# Patient Record
Sex: Female | Born: 1945 | Race: White | Hispanic: No | Marital: Married | Smoking: Never smoker
Health system: Southern US, Community
[De-identification: ages and names within clinical notes are randomized; demographics above are authoritative.]

## PROBLEM LIST (undated history)

## (undated) DIAGNOSIS — Z87440 Personal history of urinary (tract) infections: Secondary | ICD-10-CM

## (undated) DIAGNOSIS — R55 Syncope and collapse: Secondary | ICD-10-CM

## (undated) DIAGNOSIS — Z1211 Encounter for screening for malignant neoplasm of colon: Secondary | ICD-10-CM

## (undated) DIAGNOSIS — Z9221 Personal history of antineoplastic chemotherapy: Secondary | ICD-10-CM

## (undated) DIAGNOSIS — Z8739 Personal history of other diseases of the musculoskeletal system and connective tissue: Secondary | ICD-10-CM

## (undated) DIAGNOSIS — C50419 Malignant neoplasm of upper-outer quadrant of unspecified female breast: Secondary | ICD-10-CM

## (undated) DIAGNOSIS — G20 Parkinson's disease: Secondary | ICD-10-CM

## (undated) DIAGNOSIS — K5900 Constipation, unspecified: Secondary | ICD-10-CM

## (undated) DIAGNOSIS — K639 Disease of intestine, unspecified: Secondary | ICD-10-CM

## (undated) DIAGNOSIS — C801 Malignant (primary) neoplasm, unspecified: Secondary | ICD-10-CM

## (undated) DIAGNOSIS — Z85038 Personal history of other malignant neoplasm of large intestine: Secondary | ICD-10-CM

## (undated) DIAGNOSIS — S42309A Unspecified fracture of shaft of humerus, unspecified arm, initial encounter for closed fracture: Secondary | ICD-10-CM

## (undated) DIAGNOSIS — Z923 Personal history of irradiation: Secondary | ICD-10-CM

## (undated) DIAGNOSIS — C50919 Malignant neoplasm of unspecified site of unspecified female breast: Secondary | ICD-10-CM

## (undated) HISTORY — DX: Unspecified fracture of shaft of humerus, unspecified arm, initial encounter for closed fracture: S42.309A

## (undated) HISTORY — PX: JOINT REPLACEMENT: SHX530

## (undated) HISTORY — DX: Parkinson's disease: G20

## (undated) HISTORY — DX: Personal history of other diseases of the musculoskeletal system and connective tissue: Z87.39

## (undated) HISTORY — DX: Constipation, unspecified: K59.00

## (undated) HISTORY — DX: Malignant neoplasm of upper-outer quadrant of unspecified female breast: C50.419

## (undated) HISTORY — DX: Encounter for screening for malignant neoplasm of colon: Z12.11

## (undated) HISTORY — DX: Disease of intestine, unspecified: K63.9

## (undated) HISTORY — PX: CERVICAL DISCECTOMY: SHX98

## (undated) HISTORY — DX: Personal history of urinary (tract) infections: Z87.440

## (undated) HISTORY — DX: Malignant (primary) neoplasm, unspecified: C80.1

## (undated) HISTORY — DX: Personal history of other malignant neoplasm of large intestine: Z85.038

---

## 1971-01-25 HISTORY — PX: TUBAL LIGATION: SHX77

## 1972-01-25 HISTORY — PX: ABDOMINAL HYSTERECTOMY: SHX81

## 1978-01-24 HISTORY — PX: SKIN CANCER EXCISION: SHX779

## 1983-01-25 HISTORY — PX: BREAST CYST ASPIRATION: SHX578

## 1983-01-25 HISTORY — PX: BACK SURGERY: SHX140

## 1999-03-31 ENCOUNTER — Encounter: Payer: Self-pay | Admitting: Orthopedic Surgery

## 1999-03-31 ENCOUNTER — Ambulatory Visit (HOSPITAL_COMMUNITY): Admission: RE | Admit: 1999-03-31 | Discharge: 1999-03-31 | Payer: Self-pay | Admitting: Orthopedic Surgery

## 2000-01-25 DIAGNOSIS — Z8739 Personal history of other diseases of the musculoskeletal system and connective tissue: Secondary | ICD-10-CM

## 2000-01-25 HISTORY — DX: Personal history of other diseases of the musculoskeletal system and connective tissue: Z87.39

## 2001-05-29 ENCOUNTER — Ambulatory Visit (HOSPITAL_BASED_OUTPATIENT_CLINIC_OR_DEPARTMENT_OTHER): Admission: RE | Admit: 2001-05-29 | Discharge: 2001-05-29 | Payer: Self-pay | Admitting: Orthopaedic Surgery

## 2003-12-12 ENCOUNTER — Ambulatory Visit (HOSPITAL_COMMUNITY): Admission: RE | Admit: 2003-12-12 | Discharge: 2003-12-12 | Payer: Self-pay | Admitting: Gastroenterology

## 2003-12-12 ENCOUNTER — Encounter (INDEPENDENT_AMBULATORY_CARE_PROVIDER_SITE_OTHER): Payer: Self-pay | Admitting: *Deleted

## 2003-12-22 LAB — HM COLONOSCOPY

## 2004-10-30 ENCOUNTER — Emergency Department: Payer: Self-pay | Admitting: Emergency Medicine

## 2005-10-27 ENCOUNTER — Ambulatory Visit: Payer: Self-pay | Admitting: Family Medicine

## 2007-04-10 ENCOUNTER — Encounter: Payer: Self-pay | Admitting: Internal Medicine

## 2007-04-24 ENCOUNTER — Emergency Department: Payer: Self-pay | Admitting: Emergency Medicine

## 2007-08-13 ENCOUNTER — Encounter: Payer: Self-pay | Admitting: Internal Medicine

## 2007-08-20 ENCOUNTER — Ambulatory Visit: Payer: Self-pay | Admitting: Gastroenterology

## 2007-08-20 ENCOUNTER — Encounter: Payer: Self-pay | Admitting: Internal Medicine

## 2007-08-20 HISTORY — PX: UPPER GI ENDOSCOPY: SHX6162

## 2007-11-01 ENCOUNTER — Encounter: Payer: Self-pay | Admitting: Internal Medicine

## 2008-01-10 ENCOUNTER — Encounter: Payer: Self-pay | Admitting: Internal Medicine

## 2008-01-25 DIAGNOSIS — K639 Disease of intestine, unspecified: Secondary | ICD-10-CM

## 2008-01-25 HISTORY — PX: COLONOSCOPY: SHX174

## 2008-01-25 HISTORY — DX: Disease of intestine, unspecified: K63.9

## 2008-03-12 ENCOUNTER — Ambulatory Visit: Payer: Self-pay | Admitting: Gastroenterology

## 2008-03-12 ENCOUNTER — Encounter: Payer: Self-pay | Admitting: Internal Medicine

## 2008-03-22 LAB — HM COLONOSCOPY

## 2008-05-24 ENCOUNTER — Emergency Department (HOSPITAL_COMMUNITY): Admission: EM | Admit: 2008-05-24 | Discharge: 2008-05-24 | Payer: Self-pay | Admitting: Emergency Medicine

## 2008-05-28 ENCOUNTER — Ambulatory Visit: Payer: Self-pay | Admitting: Urology

## 2008-06-11 ENCOUNTER — Ambulatory Visit: Payer: Self-pay | Admitting: Internal Medicine

## 2008-06-11 DIAGNOSIS — M797 Fibromyalgia: Secondary | ICD-10-CM | POA: Insufficient documentation

## 2008-06-11 DIAGNOSIS — K5909 Other constipation: Secondary | ICD-10-CM | POA: Insufficient documentation

## 2008-06-11 DIAGNOSIS — F419 Anxiety disorder, unspecified: Secondary | ICD-10-CM | POA: Insufficient documentation

## 2008-06-16 ENCOUNTER — Ambulatory Visit: Payer: Self-pay | Admitting: Internal Medicine

## 2008-07-18 ENCOUNTER — Telehealth: Payer: Self-pay | Admitting: Internal Medicine

## 2008-07-21 ENCOUNTER — Encounter: Payer: Self-pay | Admitting: Internal Medicine

## 2008-11-20 ENCOUNTER — Telehealth: Payer: Self-pay | Admitting: Internal Medicine

## 2009-01-24 DIAGNOSIS — Z87440 Personal history of urinary (tract) infections: Secondary | ICD-10-CM

## 2009-01-24 HISTORY — PX: PORTACATH PLACEMENT: SHX2246

## 2009-01-24 HISTORY — DX: Personal history of urinary (tract) infections: Z87.440

## 2009-01-24 HISTORY — PX: BREAST LUMPECTOMY: SHX2

## 2009-01-24 HISTORY — PX: BREAST BIOPSY: SHX20

## 2009-01-24 HISTORY — PX: BREAST SURGERY: SHX581

## 2009-06-17 ENCOUNTER — Ambulatory Visit: Payer: Self-pay | Admitting: Family Medicine

## 2009-06-25 ENCOUNTER — Ambulatory Visit: Payer: Self-pay | Admitting: Family Medicine

## 2009-07-16 ENCOUNTER — Ambulatory Visit: Payer: Self-pay | Admitting: Oncology

## 2009-07-16 ENCOUNTER — Ambulatory Visit: Payer: Self-pay | Admitting: General Surgery

## 2009-07-20 ENCOUNTER — Ambulatory Visit: Payer: Self-pay | Admitting: Oncology

## 2009-07-24 ENCOUNTER — Ambulatory Visit: Payer: Self-pay | Admitting: Oncology

## 2009-07-30 ENCOUNTER — Ambulatory Visit: Payer: Self-pay | Admitting: General Surgery

## 2009-07-31 ENCOUNTER — Ambulatory Visit: Payer: Self-pay | Admitting: Oncology

## 2009-08-24 ENCOUNTER — Ambulatory Visit: Payer: Self-pay | Admitting: Oncology

## 2009-09-17 ENCOUNTER — Emergency Department: Payer: Self-pay | Admitting: Emergency Medicine

## 2009-09-24 ENCOUNTER — Ambulatory Visit: Payer: Self-pay | Admitting: Oncology

## 2009-10-24 ENCOUNTER — Ambulatory Visit: Payer: Self-pay | Admitting: Oncology

## 2009-11-24 ENCOUNTER — Ambulatory Visit: Payer: Self-pay | Admitting: Oncology

## 2009-12-24 ENCOUNTER — Ambulatory Visit: Payer: Self-pay | Admitting: Oncology

## 2009-12-24 DIAGNOSIS — C801 Malignant (primary) neoplasm, unspecified: Secondary | ICD-10-CM

## 2009-12-24 DIAGNOSIS — C50919 Malignant neoplasm of unspecified site of unspecified female breast: Secondary | ICD-10-CM

## 2009-12-24 HISTORY — DX: Malignant (primary) neoplasm, unspecified: C80.1

## 2009-12-24 HISTORY — DX: Malignant neoplasm of unspecified site of unspecified female breast: C50.919

## 2009-12-25 ENCOUNTER — Ambulatory Visit: Payer: Self-pay | Admitting: Oncology

## 2010-01-13 ENCOUNTER — Ambulatory Visit: Payer: Self-pay | Admitting: General Surgery

## 2010-01-13 DIAGNOSIS — C50419 Malignant neoplasm of upper-outer quadrant of unspecified female breast: Secondary | ICD-10-CM

## 2010-01-13 HISTORY — DX: Malignant neoplasm of upper-outer quadrant of unspecified female breast: C50.419

## 2010-01-20 LAB — PATHOLOGY REPORT

## 2010-01-24 ENCOUNTER — Ambulatory Visit: Payer: Self-pay | Admitting: Oncology

## 2010-01-24 DIAGNOSIS — Z9221 Personal history of antineoplastic chemotherapy: Secondary | ICD-10-CM

## 2010-01-24 DIAGNOSIS — Z923 Personal history of irradiation: Secondary | ICD-10-CM

## 2010-01-24 HISTORY — DX: Personal history of antineoplastic chemotherapy: Z92.21

## 2010-01-24 HISTORY — DX: Personal history of irradiation: Z92.3

## 2010-02-24 ENCOUNTER — Ambulatory Visit: Payer: Self-pay | Admitting: Oncology

## 2010-03-25 ENCOUNTER — Ambulatory Visit: Payer: Self-pay | Admitting: Oncology

## 2010-04-25 ENCOUNTER — Ambulatory Visit: Payer: Self-pay | Admitting: Oncology

## 2010-05-04 LAB — BASIC METABOLIC PANEL
BUN: 12 mg/dL (ref 6–23)
CO2: 27 mEq/L (ref 19–32)
Calcium: 9.3 mg/dL (ref 8.4–10.5)
Chloride: 107 mEq/L (ref 96–112)
Creatinine, Ser: 0.71 mg/dL (ref 0.4–1.2)
GFR calc Af Amer: >60 mL/min (ref >60)
GFR calc non Af Amer: >60 mL/min (ref >60)
Glucose, Bld: 99 mg/dL (ref 70–99)
Potassium: 4.8 mEq/L (ref 3.5–5.1)
Sodium: 140 mEq/L (ref 135–145)

## 2010-05-25 ENCOUNTER — Ambulatory Visit: Payer: Self-pay | Admitting: Oncology

## 2010-06-11 NOTE — Op Note (Signed)
NAMESHERAL, PFAHLER             ACCOUNT NO.:  [id number]   MEDICAL RECORD NO.:  [id number]          PATIENT TYPE:  AMB   LOCATION:  ENDO                         FACILITY:  [facility]   PHYSICIAN:  James L. Malon Kindle., M.D.DATE OF BIRTH:  27-Oct-1945   DATE OF PROCEDURE:  12/12/2003  DATE OF DISCHARGE:                                 OPERATIVE REPORT   PROCEDURE:  Colonoscopy and polypectomy.   MEDICATIONS:  1.  Fentanyl 125 mcg IV.  2.  Versed 12.5 mg IV.   SCOPE:  Olympus pediatric adjustable colonoscope.   INDICATIONS:  Colon cancer screening, chronic constipation.   DESCRIPTION OF PROCEDURE:  The procedure had been explained to the patient,  consent obtained.  With the patient in the left lateral decubitus position,  the Olympus scope was inserted and advanced.  The scope was withdrawn, after  the cecum was reached.  He had a long, tortuous colon but we were eventually  able to reach the cecum, identified by identification of the ileocecal valve  and appendiceal orifice.  The scope was withdrawn to the cecum; the  ascending colon, transverse colon, splenic flexure, descending and sigmoid  colon were seen well.  The prep was quite good.  No polyps were seen until  approximately 45 cm from the anal verge on withdrawal; a 0.5 cm sessile  polyp was removed with the snare and sucked through the scope.  No other  polyps were seen.  The rectum was free of polyps.   The scope was withdrawn.  The patient tolerated the procedure well.   ASSESSMENT:  Sigmoid colon polyp, removed. (211.3)   PLAN:  Check pathology and routine post-polypectomy instructions.  Will  likely need a repeat if polyp is adenomatous.      JLE/MEDQ  D:  12/12/2003  T:  12/13/2003  Job:  [id number]   cc:   Julieanne Manson  [address]  [place]  [place] [address]  Fax: [phone]

## 2010-06-11 NOTE — Op Note (Signed)
[place]  Patient:    Terri Perez, Terri Perez Visit Number: [id number] MRN: [id number]          Service Type: DSU Location: [facility] Attending Physician:  Marcene Corning Dictated by:   Lubertha Basque. Jerl Santos, M.D. Proc. Date: 05/29/01 Admit Date:  05/29/2001                             Operative Report  PREOPERATIVE DIAGNOSIS: 1. Right shoulder impingement. 2. Right shoulder acromioclavicular degeneration.  POSTOPERATIVE DIAGNOSIS: 1. Right shoulder impingement. 2. Right shoulder acromioclavicular degeneration.  OPERATION PERFORMED: 1. Right shoulder arthroscopic acromioplasty. 2. Right shoulder arthroscopic partial clavulectomy.  ANESTHESIA:  General and block.  ATTENDING SURGEON:  Lubertha Basque. Jerl Santos, M.D.  ASSISTANT:  Lindwood Qua, P.A.  INDICATIONS FOR PROCEDURE:  The patient is a 65 year old woman with a couple of years of right shoulder and arm pain.  This has persisted despite physical therapy and a subacromial injection which did afford her transient relief. She has undergone a preoperative MRI scan which showed a down-sloping acromion with a spur under the acromioclavicular joint and some irrigation of her rotator cuff but no tear.  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  She is offered an arthroscopy.  She has difficulty using her arm and cannot sleep on the side at night.  The procedure was discussed with the patient and informed operative consent was obtained after discussion of possible complications of reaction to anesthesia, infection and aggravation of her reflex sympathetic dystrophy.  DESCRIPTION OF PROCEDURE:  The patient was taken to an operating suite where general anesthetic was applied without difficulty.  She was also given a block in the preanesthesia area.  She was then positioned in beach chair position and prepped and draped in  normal sterile fashion.  After administration of preop IV antibiotics, an arthroscopy of the right shoulder was performed through a total of two portals.  Glenohumeral joint showed no degenerative change and the biceps tendon was well attached at the superior labrum and exited the shoulder appearing benign throughout.  The rotator cuff appeared benign on undersurface inspection.  The shoulder was then entered into the subacromial space.  She had some mild irritation of the rotator cuff seen from above but no tear was seen even after a thorough bursectomy was done.  She had a prominent subacromial morphology addressed with an acromioplasty back to a flat surface.  This was done with the bur in the lateral position followed by transfer of the bur to the posterior position.  She also had a prominence under her AC joint involving the distal portion of the clavicle.  This was co-planed with the acromioplasty, but a formal AC decompression was not required or done.  The shoulder was thoroughly irrigated followed by placement of Marcaine.  Simple sutures of nylon were used to loosely reapproximate the portals followed by Adaptic and a dry gauze dressing with tape.  Estimated blood loss and intraoperative fluids can be obtained from Anesthesia records.  DISPOSITION:  The patient was extubated in the operating room and taken to the recovery room in stable condition.  Plans were for her to go home the same day and to follow up in the office in less than a week.  I will contact her by phone tonight. Dictated by:   Lubertha Basque Jerl Santos, M.D. Attending Physician:  Marcene Corning DD:  05/29/01 TD:  05/30/01 Job: [id number]

## 2010-06-25 ENCOUNTER — Ambulatory Visit: Payer: Self-pay | Admitting: Oncology

## 2010-07-15 ENCOUNTER — Ambulatory Visit: Payer: Self-pay | Admitting: General Surgery

## 2010-07-25 ENCOUNTER — Ambulatory Visit: Payer: Self-pay | Admitting: Oncology

## 2010-08-26 ENCOUNTER — Ambulatory Visit: Payer: Self-pay | Admitting: Oncology

## 2010-08-27 LAB — CANCER ANTIGEN 27.29: CA 27.29: 30.8 U/mL (ref 0.0–38.6)

## 2010-09-25 ENCOUNTER — Ambulatory Visit: Payer: Self-pay | Admitting: Oncology

## 2010-10-25 ENCOUNTER — Ambulatory Visit: Payer: Self-pay | Admitting: Oncology

## 2010-11-25 ENCOUNTER — Ambulatory Visit: Payer: Self-pay | Admitting: Oncology

## 2010-12-30 ENCOUNTER — Ambulatory Visit: Payer: Self-pay | Admitting: Oncology

## 2010-12-30 ENCOUNTER — Ambulatory Visit: Payer: Self-pay | Admitting: General Surgery

## 2011-01-25 ENCOUNTER — Ambulatory Visit: Payer: Self-pay | Admitting: Oncology

## 2011-01-25 DIAGNOSIS — K5900 Constipation, unspecified: Secondary | ICD-10-CM | POA: Diagnosis not present

## 2011-01-25 DIAGNOSIS — F329 Major depressive disorder, single episode, unspecified: Secondary | ICD-10-CM | POA: Diagnosis not present

## 2011-01-25 DIAGNOSIS — Z79899 Other long term (current) drug therapy: Secondary | ICD-10-CM | POA: Diagnosis not present

## 2011-01-25 DIAGNOSIS — Z79811 Long term (current) use of aromatase inhibitors: Secondary | ICD-10-CM | POA: Diagnosis not present

## 2011-01-25 DIAGNOSIS — IMO0001 Reserved for inherently not codable concepts without codable children: Secondary | ICD-10-CM | POA: Diagnosis not present

## 2011-01-25 DIAGNOSIS — C50919 Malignant neoplasm of unspecified site of unspecified female breast: Secondary | ICD-10-CM | POA: Diagnosis not present

## 2011-01-25 DIAGNOSIS — F411 Generalized anxiety disorder: Secondary | ICD-10-CM | POA: Diagnosis not present

## 2011-01-25 DIAGNOSIS — G9050 Complex regional pain syndrome I, unspecified: Secondary | ICD-10-CM | POA: Diagnosis not present

## 2011-01-25 DIAGNOSIS — Z9221 Personal history of antineoplastic chemotherapy: Secondary | ICD-10-CM | POA: Diagnosis not present

## 2011-01-25 DIAGNOSIS — Z452 Encounter for adjustment and management of vascular access device: Secondary | ICD-10-CM | POA: Diagnosis not present

## 2011-01-25 DIAGNOSIS — Z170 Estrogen receptor positive status [ER+]: Secondary | ICD-10-CM | POA: Diagnosis not present

## 2011-01-25 DIAGNOSIS — K5989 Other specified functional intestinal disorders: Secondary | ICD-10-CM | POA: Diagnosis not present

## 2011-01-25 HISTORY — PX: PORT-A-CATH REMOVAL: SHX5289

## 2011-01-31 DIAGNOSIS — N816 Rectocele: Secondary | ICD-10-CM | POA: Diagnosis not present

## 2011-02-10 DIAGNOSIS — C50919 Malignant neoplasm of unspecified site of unspecified female breast: Secondary | ICD-10-CM | POA: Diagnosis not present

## 2011-02-10 DIAGNOSIS — Z79811 Long term (current) use of aromatase inhibitors: Secondary | ICD-10-CM | POA: Diagnosis not present

## 2011-02-10 DIAGNOSIS — Z170 Estrogen receptor positive status [ER+]: Secondary | ICD-10-CM | POA: Diagnosis not present

## 2011-02-10 LAB — CBC CANCER CENTER
Basophil #: 0 x10 3/mm (ref 0.0–0.1)
HGB: 12.5 g/dL (ref 12.0–16.0)
Lymphocyte #: 1.5 x10 3/mm (ref 1.0–3.6)
Lymphocyte %: 35.5 %
MCH: 33.2 pg (ref 26.0–34.0)
Monocyte #: 0.4 x10 3/mm (ref 0.0–0.7)
Monocyte %: 10.2 %
Neutrophil %: 51 %
Platelet: 188 x10 3/mm (ref 150–440)
RBC: 3.78 10*6/uL — ABNORMAL LOW (ref 3.80–5.20)
RDW: 13.8 % (ref 11.5–14.5)
WBC: 4.3 x10 3/mm (ref 3.6–11.0)

## 2011-02-10 LAB — COMPREHENSIVE METABOLIC PANEL
Albumin: 3.5 g/dL (ref 3.4–5.0)
Alkaline Phosphatase: 99 U/L (ref 50–136)
Anion Gap: 10 (ref 7–16)
Bilirubin,Total: 0.3 mg/dL (ref 0.2–1.0)
Calcium, Total: 9.1 mg/dL (ref 8.5–10.1)
Chloride: 105 mmol/L (ref 98–107)
EGFR (African American): >60
EGFR (Non-African Amer.): 55 — ABNORMAL LOW
SGOT(AST): 14 U/L — ABNORMAL LOW (ref 15–37)
Total Protein: 6.8 g/dL (ref 6.4–8.2)

## 2011-02-11 LAB — CANCER ANTIGEN 27.29: CA 27.29: 20.2 U/mL (ref 0.0–38.6)

## 2011-02-17 DIAGNOSIS — Z853 Personal history of malignant neoplasm of breast: Secondary | ICD-10-CM | POA: Diagnosis not present

## 2011-02-25 ENCOUNTER — Ambulatory Visit: Payer: Self-pay | Admitting: Physical Medicine and Rehabilitation

## 2011-02-25 ENCOUNTER — Ambulatory Visit: Payer: Self-pay | Admitting: Oncology

## 2011-02-28 DIAGNOSIS — Z79899 Other long term (current) drug therapy: Secondary | ICD-10-CM | POA: Diagnosis not present

## 2011-02-28 DIAGNOSIS — F411 Generalized anxiety disorder: Secondary | ICD-10-CM | POA: Diagnosis not present

## 2011-02-28 DIAGNOSIS — K5909 Other constipation: Secondary | ICD-10-CM | POA: Diagnosis not present

## 2011-02-28 DIAGNOSIS — K5900 Constipation, unspecified: Secondary | ICD-10-CM | POA: Diagnosis not present

## 2011-03-08 DIAGNOSIS — K5900 Constipation, unspecified: Secondary | ICD-10-CM | POA: Diagnosis not present

## 2011-03-22 DIAGNOSIS — F411 Generalized anxiety disorder: Secondary | ICD-10-CM | POA: Diagnosis not present

## 2011-03-22 DIAGNOSIS — F329 Major depressive disorder, single episode, unspecified: Secondary | ICD-10-CM | POA: Diagnosis not present

## 2011-03-22 DIAGNOSIS — C50919 Malignant neoplasm of unspecified site of unspecified female breast: Secondary | ICD-10-CM | POA: Diagnosis not present

## 2011-03-22 DIAGNOSIS — K589 Irritable bowel syndrome without diarrhea: Secondary | ICD-10-CM | POA: Diagnosis not present

## 2011-05-09 DIAGNOSIS — K5902 Outlet dysfunction constipation: Secondary | ICD-10-CM | POA: Diagnosis not present

## 2011-05-18 ENCOUNTER — Encounter: Payer: Self-pay | Admitting: Gastroenterology

## 2011-05-18 DIAGNOSIS — R279 Unspecified lack of coordination: Secondary | ICD-10-CM | POA: Diagnosis not present

## 2011-05-18 DIAGNOSIS — C50919 Malignant neoplasm of unspecified site of unspecified female breast: Secondary | ICD-10-CM | POA: Diagnosis not present

## 2011-05-18 DIAGNOSIS — N3946 Mixed incontinence: Secondary | ICD-10-CM | POA: Diagnosis not present

## 2011-05-18 DIAGNOSIS — K5900 Constipation, unspecified: Secondary | ICD-10-CM | POA: Diagnosis not present

## 2011-05-18 DIAGNOSIS — F411 Generalized anxiety disorder: Secondary | ICD-10-CM | POA: Diagnosis not present

## 2011-05-18 DIAGNOSIS — K589 Irritable bowel syndrome without diarrhea: Secondary | ICD-10-CM | POA: Diagnosis not present

## 2011-05-18 DIAGNOSIS — F329 Major depressive disorder, single episode, unspecified: Secondary | ICD-10-CM | POA: Diagnosis not present

## 2011-05-18 DIAGNOSIS — IMO0001 Reserved for inherently not codable concepts without codable children: Secondary | ICD-10-CM | POA: Diagnosis not present

## 2011-05-25 ENCOUNTER — Encounter: Payer: Self-pay | Admitting: Gastroenterology

## 2011-05-25 DIAGNOSIS — N3946 Mixed incontinence: Secondary | ICD-10-CM | POA: Diagnosis not present

## 2011-05-25 DIAGNOSIS — IMO0001 Reserved for inherently not codable concepts without codable children: Secondary | ICD-10-CM | POA: Diagnosis not present

## 2011-05-25 DIAGNOSIS — K5900 Constipation, unspecified: Secondary | ICD-10-CM | POA: Diagnosis not present

## 2011-05-25 DIAGNOSIS — R279 Unspecified lack of coordination: Secondary | ICD-10-CM | POA: Diagnosis not present

## 2011-05-30 DIAGNOSIS — K5900 Constipation, unspecified: Secondary | ICD-10-CM | POA: Diagnosis not present

## 2011-05-30 DIAGNOSIS — IMO0001 Reserved for inherently not codable concepts without codable children: Secondary | ICD-10-CM | POA: Diagnosis not present

## 2011-05-30 DIAGNOSIS — N3946 Mixed incontinence: Secondary | ICD-10-CM | POA: Diagnosis not present

## 2011-05-30 DIAGNOSIS — R279 Unspecified lack of coordination: Secondary | ICD-10-CM | POA: Diagnosis not present

## 2011-06-27 DIAGNOSIS — R143 Flatulence: Secondary | ICD-10-CM | POA: Diagnosis not present

## 2011-06-27 DIAGNOSIS — R142 Eructation: Secondary | ICD-10-CM | POA: Diagnosis not present

## 2011-06-27 DIAGNOSIS — R141 Gas pain: Secondary | ICD-10-CM | POA: Diagnosis not present

## 2011-06-27 DIAGNOSIS — R110 Nausea: Secondary | ICD-10-CM | POA: Diagnosis not present

## 2011-06-27 DIAGNOSIS — R109 Unspecified abdominal pain: Secondary | ICD-10-CM | POA: Diagnosis not present

## 2011-06-27 DIAGNOSIS — K5900 Constipation, unspecified: Secondary | ICD-10-CM | POA: Diagnosis not present

## 2011-06-27 DIAGNOSIS — K5909 Other constipation: Secondary | ICD-10-CM | POA: Diagnosis not present

## 2011-07-21 ENCOUNTER — Ambulatory Visit: Payer: Self-pay | Admitting: General Surgery

## 2011-07-21 DIAGNOSIS — N6459 Other signs and symptoms in breast: Secondary | ICD-10-CM | POA: Diagnosis not present

## 2011-07-21 DIAGNOSIS — Z853 Personal history of malignant neoplasm of breast: Secondary | ICD-10-CM | POA: Diagnosis not present

## 2011-07-21 DIAGNOSIS — R928 Other abnormal and inconclusive findings on diagnostic imaging of breast: Secondary | ICD-10-CM | POA: Diagnosis not present

## 2011-07-25 ENCOUNTER — Ambulatory Visit: Payer: Self-pay | Admitting: General Surgery

## 2011-07-25 DIAGNOSIS — R928 Other abnormal and inconclusive findings on diagnostic imaging of breast: Secondary | ICD-10-CM | POA: Diagnosis not present

## 2011-08-04 DIAGNOSIS — Z853 Personal history of malignant neoplasm of breast: Secondary | ICD-10-CM | POA: Diagnosis not present

## 2011-08-10 DIAGNOSIS — R1013 Epigastric pain: Secondary | ICD-10-CM | POA: Diagnosis not present

## 2011-08-10 DIAGNOSIS — Z79899 Other long term (current) drug therapy: Secondary | ICD-10-CM | POA: Diagnosis not present

## 2011-08-10 DIAGNOSIS — R143 Flatulence: Secondary | ICD-10-CM | POA: Diagnosis not present

## 2011-08-10 DIAGNOSIS — K3189 Other diseases of stomach and duodenum: Secondary | ICD-10-CM | POA: Diagnosis not present

## 2011-08-10 DIAGNOSIS — R109 Unspecified abdominal pain: Secondary | ICD-10-CM | POA: Diagnosis not present

## 2011-08-10 DIAGNOSIS — K3184 Gastroparesis: Secondary | ICD-10-CM | POA: Diagnosis not present

## 2011-08-10 DIAGNOSIS — R141 Gas pain: Secondary | ICD-10-CM | POA: Diagnosis not present

## 2011-08-10 DIAGNOSIS — R110 Nausea: Secondary | ICD-10-CM | POA: Diagnosis not present

## 2011-08-10 DIAGNOSIS — K5900 Constipation, unspecified: Secondary | ICD-10-CM | POA: Diagnosis not present

## 2011-08-11 ENCOUNTER — Ambulatory Visit: Payer: Self-pay | Admitting: Oncology

## 2011-08-11 DIAGNOSIS — F411 Generalized anxiety disorder: Secondary | ICD-10-CM | POA: Diagnosis not present

## 2011-08-11 DIAGNOSIS — Z85828 Personal history of other malignant neoplasm of skin: Secondary | ICD-10-CM | POA: Diagnosis not present

## 2011-08-11 DIAGNOSIS — Z79899 Other long term (current) drug therapy: Secondary | ICD-10-CM | POA: Diagnosis not present

## 2011-08-11 DIAGNOSIS — Z79811 Long term (current) use of aromatase inhibitors: Secondary | ICD-10-CM | POA: Diagnosis not present

## 2011-08-11 DIAGNOSIS — K5900 Constipation, unspecified: Secondary | ICD-10-CM | POA: Diagnosis not present

## 2011-08-11 DIAGNOSIS — F329 Major depressive disorder, single episode, unspecified: Secondary | ICD-10-CM | POA: Diagnosis not present

## 2011-08-11 DIAGNOSIS — C50919 Malignant neoplasm of unspecified site of unspecified female breast: Secondary | ICD-10-CM | POA: Diagnosis not present

## 2011-08-11 DIAGNOSIS — Z170 Estrogen receptor positive status [ER+]: Secondary | ICD-10-CM | POA: Diagnosis not present

## 2011-08-11 DIAGNOSIS — IMO0001 Reserved for inherently not codable concepts without codable children: Secondary | ICD-10-CM | POA: Diagnosis not present

## 2011-08-11 LAB — COMPREHENSIVE METABOLIC PANEL
Albumin: 3.6 g/dL (ref 3.4–5.0)
Alkaline Phosphatase: 86 U/L (ref 50–136)
Bilirubin,Total: 0.4 mg/dL (ref 0.2–1.0)
Co2: 27 mmol/L (ref 21–32)
Creatinine: 1.14 mg/dL (ref 0.60–1.30)
Glucose: 90 mg/dL (ref 65–99)
Osmolality: 283 (ref 275–301)
Sodium: 142 mmol/L (ref 136–145)
Total Protein: 7.1 g/dL (ref 6.4–8.2)

## 2011-08-11 LAB — CBC CANCER CENTER
Basophil #: 0 x10 3/mm (ref 0.0–0.1)
Lymphocyte #: 1.3 x10 3/mm (ref 1.0–3.6)
Lymphocyte %: 37.7 %
MCHC: 32.3 g/dL (ref 32.0–36.0)
MCV: 101 fL — ABNORMAL HIGH (ref 80–100)
Monocyte #: 0.3 x10 3/mm (ref 0.2–0.9)
Monocyte %: 9.3 %
Platelet: 185 x10 3/mm (ref 150–440)
RDW: 13.4 % (ref 11.5–14.5)
WBC: 3.5 x10 3/mm — ABNORMAL LOW (ref 3.6–11.0)

## 2011-08-12 LAB — CANCER ANTIGEN 27.29: CA 27.29: 23.9 U/mL (ref 0.0–38.6)

## 2011-08-18 DIAGNOSIS — F411 Generalized anxiety disorder: Secondary | ICD-10-CM | POA: Diagnosis not present

## 2011-08-18 DIAGNOSIS — C50919 Malignant neoplasm of unspecified site of unspecified female breast: Secondary | ICD-10-CM | POA: Diagnosis not present

## 2011-08-18 DIAGNOSIS — F329 Major depressive disorder, single episode, unspecified: Secondary | ICD-10-CM | POA: Diagnosis not present

## 2011-08-18 DIAGNOSIS — K589 Irritable bowel syndrome without diarrhea: Secondary | ICD-10-CM | POA: Diagnosis not present

## 2011-08-22 DIAGNOSIS — K5900 Constipation, unspecified: Secondary | ICD-10-CM | POA: Diagnosis not present

## 2011-08-25 ENCOUNTER — Ambulatory Visit: Payer: Self-pay | Admitting: Oncology

## 2011-10-10 DIAGNOSIS — K5900 Constipation, unspecified: Secondary | ICD-10-CM | POA: Diagnosis not present

## 2011-10-19 DIAGNOSIS — F0630 Mood disorder due to known physiological condition, unspecified: Secondary | ICD-10-CM | POA: Diagnosis not present

## 2011-10-19 DIAGNOSIS — F411 Generalized anxiety disorder: Secondary | ICD-10-CM | POA: Diagnosis not present

## 2011-11-01 DIAGNOSIS — K5901 Slow transit constipation: Secondary | ICD-10-CM | POA: Diagnosis not present

## 2011-11-21 DIAGNOSIS — F329 Major depressive disorder, single episode, unspecified: Secondary | ICD-10-CM | POA: Diagnosis not present

## 2011-11-21 DIAGNOSIS — K589 Irritable bowel syndrome without diarrhea: Secondary | ICD-10-CM | POA: Diagnosis not present

## 2011-11-21 DIAGNOSIS — F411 Generalized anxiety disorder: Secondary | ICD-10-CM | POA: Diagnosis not present

## 2011-11-21 DIAGNOSIS — C50919 Malignant neoplasm of unspecified site of unspecified female breast: Secondary | ICD-10-CM | POA: Diagnosis not present

## 2011-12-01 DIAGNOSIS — B079 Viral wart, unspecified: Secondary | ICD-10-CM | POA: Diagnosis not present

## 2011-12-01 DIAGNOSIS — L819 Disorder of pigmentation, unspecified: Secondary | ICD-10-CM | POA: Diagnosis not present

## 2012-01-25 ENCOUNTER — Ambulatory Visit: Payer: Self-pay | Admitting: Oncology

## 2012-02-03 DIAGNOSIS — R300 Dysuria: Secondary | ICD-10-CM | POA: Diagnosis not present

## 2012-02-03 DIAGNOSIS — R5383 Other fatigue: Secondary | ICD-10-CM | POA: Diagnosis not present

## 2012-02-03 DIAGNOSIS — N390 Urinary tract infection, site not specified: Secondary | ICD-10-CM | POA: Diagnosis not present

## 2012-02-03 DIAGNOSIS — J3489 Other specified disorders of nose and nasal sinuses: Secondary | ICD-10-CM | POA: Diagnosis not present

## 2012-02-03 DIAGNOSIS — R109 Unspecified abdominal pain: Secondary | ICD-10-CM | POA: Diagnosis not present

## 2012-02-03 DIAGNOSIS — R5381 Other malaise: Secondary | ICD-10-CM | POA: Diagnosis not present

## 2012-02-03 DIAGNOSIS — R350 Frequency of micturition: Secondary | ICD-10-CM | POA: Diagnosis not present

## 2012-02-09 ENCOUNTER — Ambulatory Visit: Payer: Self-pay | Admitting: Oncology

## 2012-02-09 DIAGNOSIS — Z79899 Other long term (current) drug therapy: Secondary | ICD-10-CM | POA: Diagnosis not present

## 2012-02-09 DIAGNOSIS — F329 Major depressive disorder, single episode, unspecified: Secondary | ICD-10-CM | POA: Diagnosis not present

## 2012-02-09 DIAGNOSIS — N390 Urinary tract infection, site not specified: Secondary | ICD-10-CM | POA: Diagnosis not present

## 2012-02-09 DIAGNOSIS — Z923 Personal history of irradiation: Secondary | ICD-10-CM | POA: Diagnosis not present

## 2012-02-09 DIAGNOSIS — K5900 Constipation, unspecified: Secondary | ICD-10-CM | POA: Diagnosis not present

## 2012-02-09 DIAGNOSIS — Z87440 Personal history of urinary (tract) infections: Secondary | ICD-10-CM | POA: Diagnosis not present

## 2012-02-09 DIAGNOSIS — F411 Generalized anxiety disorder: Secondary | ICD-10-CM | POA: Diagnosis not present

## 2012-02-09 DIAGNOSIS — IMO0001 Reserved for inherently not codable concepts without codable children: Secondary | ICD-10-CM | POA: Diagnosis not present

## 2012-02-09 DIAGNOSIS — Z170 Estrogen receptor positive status [ER+]: Secondary | ICD-10-CM | POA: Diagnosis not present

## 2012-02-09 DIAGNOSIS — C50919 Malignant neoplasm of unspecified site of unspecified female breast: Secondary | ICD-10-CM | POA: Diagnosis not present

## 2012-02-09 DIAGNOSIS — Z85828 Personal history of other malignant neoplasm of skin: Secondary | ICD-10-CM | POA: Diagnosis not present

## 2012-02-09 DIAGNOSIS — Z79811 Long term (current) use of aromatase inhibitors: Secondary | ICD-10-CM | POA: Diagnosis not present

## 2012-02-09 LAB — CBC CANCER CENTER
Eosinophil #: 0.1 x10 3/mm (ref 0.0–0.7)
Eosinophil %: 2.5 %
HCT: 38.1 % (ref 35.0–47.0)
HGB: 12.9 g/dL (ref 12.0–16.0)
Lymphocyte #: 1.4 x10 3/mm (ref 1.0–3.6)
MCHC: 34 g/dL (ref 32.0–36.0)
MCV: 97 fL (ref 80–100)
Monocyte #: 0.4 x10 3/mm (ref 0.2–0.9)
Neutrophil %: 55.4 %
Platelet: 187 x10 3/mm (ref 150–440)
RBC: 3.92 10*6/uL (ref 3.80–5.20)
WBC: 4.5 x10 3/mm (ref 3.6–11.0)

## 2012-02-09 LAB — COMPREHENSIVE METABOLIC PANEL
Albumin: 3.4 g/dL (ref 3.4–5.0)
BUN: 13 mg/dL (ref 7–18)
EGFR (Non-African Amer.): >60
Potassium: 3.8 mmol/L (ref 3.5–5.1)
SGPT (ALT): 22 U/L (ref 12–78)
Total Protein: 6.8 g/dL (ref 6.4–8.2)

## 2012-02-10 LAB — CANCER ANTIGEN 27.29: CA 27.29: 23.4 U/mL (ref 0.0–38.6)

## 2012-02-23 ENCOUNTER — Ambulatory Visit: Payer: Self-pay | Admitting: General Surgery

## 2012-02-23 DIAGNOSIS — Z853 Personal history of malignant neoplasm of breast: Secondary | ICD-10-CM | POA: Diagnosis not present

## 2012-02-23 DIAGNOSIS — R928 Other abnormal and inconclusive findings on diagnostic imaging of breast: Secondary | ICD-10-CM | POA: Diagnosis not present

## 2012-02-25 ENCOUNTER — Ambulatory Visit: Payer: Self-pay | Admitting: Oncology

## 2012-03-01 DIAGNOSIS — Z853 Personal history of malignant neoplasm of breast: Secondary | ICD-10-CM | POA: Diagnosis not present

## 2012-03-15 DIAGNOSIS — H04129 Dry eye syndrome of unspecified lacrimal gland: Secondary | ICD-10-CM | POA: Diagnosis not present

## 2012-03-27 DIAGNOSIS — N760 Acute vaginitis: Secondary | ICD-10-CM | POA: Diagnosis not present

## 2012-04-25 DIAGNOSIS — F411 Generalized anxiety disorder: Secondary | ICD-10-CM | POA: Diagnosis not present

## 2012-04-25 DIAGNOSIS — F329 Major depressive disorder, single episode, unspecified: Secondary | ICD-10-CM | POA: Diagnosis not present

## 2012-04-25 DIAGNOSIS — G4700 Insomnia, unspecified: Secondary | ICD-10-CM | POA: Diagnosis not present

## 2012-04-30 DIAGNOSIS — M722 Plantar fascial fibromatosis: Secondary | ICD-10-CM | POA: Diagnosis not present

## 2012-07-24 ENCOUNTER — Ambulatory Visit: Payer: Self-pay | Admitting: Oncology

## 2012-07-24 DIAGNOSIS — R5381 Other malaise: Secondary | ICD-10-CM | POA: Diagnosis not present

## 2012-07-24 DIAGNOSIS — IMO0001 Reserved for inherently not codable concepts without codable children: Secondary | ICD-10-CM | POA: Diagnosis not present

## 2012-07-24 DIAGNOSIS — Z85828 Personal history of other malignant neoplasm of skin: Secondary | ICD-10-CM | POA: Diagnosis not present

## 2012-07-24 DIAGNOSIS — F411 Generalized anxiety disorder: Secondary | ICD-10-CM | POA: Diagnosis not present

## 2012-07-24 DIAGNOSIS — Z79811 Long term (current) use of aromatase inhibitors: Secondary | ICD-10-CM | POA: Diagnosis not present

## 2012-07-24 DIAGNOSIS — Z170 Estrogen receptor positive status [ER+]: Secondary | ICD-10-CM | POA: Diagnosis not present

## 2012-07-24 DIAGNOSIS — F329 Major depressive disorder, single episode, unspecified: Secondary | ICD-10-CM | POA: Diagnosis not present

## 2012-07-24 DIAGNOSIS — C50919 Malignant neoplasm of unspecified site of unspecified female breast: Secondary | ICD-10-CM | POA: Diagnosis not present

## 2012-07-24 DIAGNOSIS — K3189 Other diseases of stomach and duodenum: Secondary | ICD-10-CM | POA: Diagnosis not present

## 2012-07-24 DIAGNOSIS — Z79899 Other long term (current) drug therapy: Secondary | ICD-10-CM | POA: Diagnosis not present

## 2012-07-24 DIAGNOSIS — Z90710 Acquired absence of both cervix and uterus: Secondary | ICD-10-CM | POA: Diagnosis not present

## 2012-07-25 ENCOUNTER — Encounter: Payer: Self-pay | Admitting: *Deleted

## 2012-07-25 DIAGNOSIS — Z85038 Personal history of other malignant neoplasm of large intestine: Secondary | ICD-10-CM | POA: Insufficient documentation

## 2012-07-25 DIAGNOSIS — C50419 Malignant neoplasm of upper-outer quadrant of unspecified female breast: Secondary | ICD-10-CM | POA: Insufficient documentation

## 2012-08-09 DIAGNOSIS — Z170 Estrogen receptor positive status [ER+]: Secondary | ICD-10-CM | POA: Diagnosis not present

## 2012-08-09 DIAGNOSIS — C50919 Malignant neoplasm of unspecified site of unspecified female breast: Secondary | ICD-10-CM | POA: Diagnosis not present

## 2012-08-09 DIAGNOSIS — K3189 Other diseases of stomach and duodenum: Secondary | ICD-10-CM | POA: Diagnosis not present

## 2012-08-09 DIAGNOSIS — R1013 Epigastric pain: Secondary | ICD-10-CM | POA: Diagnosis not present

## 2012-08-09 DIAGNOSIS — Z79811 Long term (current) use of aromatase inhibitors: Secondary | ICD-10-CM | POA: Diagnosis not present

## 2012-08-09 LAB — CBC CANCER CENTER
Basophil #: 0 x10 3/mm (ref 0.0–0.1)
HCT: 37.9 % (ref 35.0–47.0)
HGB: 12.9 g/dL (ref 12.0–16.0)
Lymphocyte #: 1.3 x10 3/mm (ref 1.0–3.6)
MCHC: 34.1 g/dL (ref 32.0–36.0)
MCV: 97 fL (ref 80–100)
Monocyte #: 0.5 x10 3/mm (ref 0.2–0.9)
Monocyte %: 9.9 %
RBC: 3.9 10*6/uL (ref 3.80–5.20)
RDW: 13.6 % (ref 11.5–14.5)
WBC: 4.5 x10 3/mm (ref 3.6–11.0)

## 2012-08-09 LAB — COMPREHENSIVE METABOLIC PANEL
Calcium, Total: 9.2 mg/dL (ref 8.5–10.1)
Creatinine: 0.91 mg/dL (ref 0.60–1.30)
EGFR (Non-African Amer.): >60
Glucose: 95 mg/dL (ref 65–99)
Osmolality: 281 (ref 275–301)
Potassium: 4.2 mmol/L (ref 3.5–5.1)
SGPT (ALT): 20 U/L (ref 12–78)
Sodium: 139 mmol/L (ref 136–145)

## 2012-08-11 LAB — CANCER ANTIGEN 27.29: CA 27.29: 24.2 U/mL (ref 0.0–38.6)

## 2012-08-24 ENCOUNTER — Ambulatory Visit: Payer: Self-pay | Admitting: Oncology

## 2012-09-03 DIAGNOSIS — K5902 Outlet dysfunction constipation: Secondary | ICD-10-CM | POA: Diagnosis not present

## 2012-09-12 DIAGNOSIS — F411 Generalized anxiety disorder: Secondary | ICD-10-CM | POA: Diagnosis not present

## 2012-09-12 DIAGNOSIS — B079 Viral wart, unspecified: Secondary | ICD-10-CM | POA: Diagnosis not present

## 2012-09-12 DIAGNOSIS — F329 Major depressive disorder, single episode, unspecified: Secondary | ICD-10-CM | POA: Diagnosis not present

## 2012-09-12 DIAGNOSIS — G4700 Insomnia, unspecified: Secondary | ICD-10-CM | POA: Diagnosis not present

## 2012-09-12 DIAGNOSIS — K5900 Constipation, unspecified: Secondary | ICD-10-CM | POA: Diagnosis not present

## 2012-09-17 DIAGNOSIS — K5902 Outlet dysfunction constipation: Secondary | ICD-10-CM | POA: Diagnosis not present

## 2012-09-24 HISTORY — PX: COLON RESECTION: SHX5231

## 2012-09-27 DIAGNOSIS — IMO0001 Reserved for inherently not codable concepts without codable children: Secondary | ICD-10-CM | POA: Diagnosis not present

## 2012-09-27 DIAGNOSIS — R141 Gas pain: Secondary | ICD-10-CM | POA: Diagnosis not present

## 2012-09-27 DIAGNOSIS — Z01818 Encounter for other preprocedural examination: Secondary | ICD-10-CM | POA: Diagnosis not present

## 2012-09-27 DIAGNOSIS — F411 Generalized anxiety disorder: Secondary | ICD-10-CM | POA: Diagnosis not present

## 2012-09-27 DIAGNOSIS — Z853 Personal history of malignant neoplasm of breast: Secondary | ICD-10-CM | POA: Diagnosis not present

## 2012-09-27 DIAGNOSIS — K5900 Constipation, unspecified: Secondary | ICD-10-CM | POA: Diagnosis not present

## 2012-10-02 DIAGNOSIS — K5900 Constipation, unspecified: Secondary | ICD-10-CM | POA: Diagnosis present

## 2012-10-02 DIAGNOSIS — F411 Generalized anxiety disorder: Secondary | ICD-10-CM | POA: Diagnosis present

## 2012-10-02 DIAGNOSIS — K5901 Slow transit constipation: Secondary | ICD-10-CM | POA: Diagnosis not present

## 2012-10-02 DIAGNOSIS — C50919 Malignant neoplasm of unspecified site of unspecified female breast: Secondary | ICD-10-CM | POA: Diagnosis present

## 2012-10-02 DIAGNOSIS — K929 Disease of digestive system, unspecified: Secondary | ICD-10-CM | POA: Diagnosis not present

## 2012-10-02 DIAGNOSIS — IMO0001 Reserved for inherently not codable concepts without codable children: Secondary | ICD-10-CM | POA: Diagnosis present

## 2012-10-02 DIAGNOSIS — K5989 Other specified functional intestinal disorders: Secondary | ICD-10-CM | POA: Diagnosis not present

## 2012-10-29 DIAGNOSIS — K5989 Other specified functional intestinal disorders: Secondary | ICD-10-CM | POA: Diagnosis not present

## 2012-11-09 DIAGNOSIS — R319 Hematuria, unspecified: Secondary | ICD-10-CM | POA: Diagnosis not present

## 2012-11-09 DIAGNOSIS — N390 Urinary tract infection, site not specified: Secondary | ICD-10-CM | POA: Diagnosis not present

## 2012-11-09 DIAGNOSIS — R110 Nausea: Secondary | ICD-10-CM | POA: Diagnosis not present

## 2012-12-10 DIAGNOSIS — K5989 Other specified functional intestinal disorders: Secondary | ICD-10-CM | POA: Diagnosis not present

## 2013-01-10 DIAGNOSIS — F411 Generalized anxiety disorder: Secondary | ICD-10-CM | POA: Diagnosis not present

## 2013-01-10 DIAGNOSIS — IMO0001 Reserved for inherently not codable concepts without codable children: Secondary | ICD-10-CM | POA: Diagnosis not present

## 2013-02-14 ENCOUNTER — Ambulatory Visit: Payer: Self-pay | Admitting: Oncology

## 2013-02-14 DIAGNOSIS — L659 Nonscarring hair loss, unspecified: Secondary | ICD-10-CM | POA: Diagnosis not present

## 2013-02-14 DIAGNOSIS — C50919 Malignant neoplasm of unspecified site of unspecified female breast: Secondary | ICD-10-CM | POA: Diagnosis not present

## 2013-02-14 DIAGNOSIS — K3189 Other diseases of stomach and duodenum: Secondary | ICD-10-CM | POA: Diagnosis not present

## 2013-02-14 DIAGNOSIS — F329 Major depressive disorder, single episode, unspecified: Secondary | ICD-10-CM | POA: Diagnosis not present

## 2013-02-14 DIAGNOSIS — Z85828 Personal history of other malignant neoplasm of skin: Secondary | ICD-10-CM | POA: Diagnosis not present

## 2013-02-14 DIAGNOSIS — Z803 Family history of malignant neoplasm of breast: Secondary | ICD-10-CM | POA: Diagnosis not present

## 2013-02-14 DIAGNOSIS — R1013 Epigastric pain: Secondary | ICD-10-CM | POA: Diagnosis not present

## 2013-02-14 DIAGNOSIS — Z170 Estrogen receptor positive status [ER+]: Secondary | ICD-10-CM | POA: Diagnosis not present

## 2013-02-14 DIAGNOSIS — Z90710 Acquired absence of both cervix and uterus: Secondary | ICD-10-CM | POA: Diagnosis not present

## 2013-02-14 DIAGNOSIS — IMO0001 Reserved for inherently not codable concepts without codable children: Secondary | ICD-10-CM | POA: Diagnosis not present

## 2013-02-14 DIAGNOSIS — F3289 Other specified depressive episodes: Secondary | ICD-10-CM | POA: Diagnosis not present

## 2013-02-14 DIAGNOSIS — Z79899 Other long term (current) drug therapy: Secondary | ICD-10-CM | POA: Diagnosis not present

## 2013-02-14 DIAGNOSIS — Z79811 Long term (current) use of aromatase inhibitors: Secondary | ICD-10-CM | POA: Diagnosis not present

## 2013-02-14 DIAGNOSIS — Z9889 Other specified postprocedural states: Secondary | ICD-10-CM | POA: Diagnosis not present

## 2013-02-14 LAB — CBC CANCER CENTER
BASOS ABS: 0 x10 3/mm (ref 0.0–0.1)
Basophil %: 0.6 %
EOS ABS: 0.1 x10 3/mm (ref 0.0–0.7)
Eosinophil %: 2.3 %
HCT: 37 % (ref 35.0–47.0)
HGB: 12.1 g/dL (ref 12.0–16.0)
LYMPHS ABS: 1.6 x10 3/mm (ref 1.0–3.6)
LYMPHS PCT: 33 %
MCH: 31.8 pg (ref 26.0–34.0)
MCHC: 32.8 g/dL (ref 32.0–36.0)
MCV: 97 fL (ref 80–100)
MONO ABS: 0.4 x10 3/mm (ref 0.2–0.9)
MONOS PCT: 8 %
NEUTROS PCT: 56.1 %
Neutrophil #: 2.7 x10 3/mm (ref 1.4–6.5)
Platelet: 206 x10 3/mm (ref 150–440)
RBC: 3.82 10*6/uL (ref 3.80–5.20)
RDW: 13.2 % (ref 11.5–14.5)
WBC: 4.8 x10 3/mm (ref 3.6–11.0)

## 2013-02-14 LAB — COMPREHENSIVE METABOLIC PANEL
Albumin: 3.8 g/dL (ref 3.4–5.0)
Alkaline Phosphatase: 80 U/L
Anion Gap: 10 (ref 7–16)
BILIRUBIN TOTAL: 0.3 mg/dL (ref 0.2–1.0)
BUN: 22 mg/dL — AB (ref 7–18)
CHLORIDE: 102 mmol/L (ref 98–107)
Calcium, Total: 9.2 mg/dL (ref 8.5–10.1)
Co2: 25 mmol/L (ref 21–32)
Creatinine: 1.16 mg/dL (ref 0.60–1.30)
EGFR (African American): 56 — ABNORMAL LOW
EGFR (Non-African Amer.): 48 — ABNORMAL LOW
GLUCOSE: 107 mg/dL — AB (ref 65–99)
OSMOLALITY: 278 (ref 275–301)
Potassium: 4.1 mmol/L (ref 3.5–5.1)
SGOT(AST): 16 U/L (ref 15–37)
SGPT (ALT): 21 U/L (ref 12–78)
Sodium: 137 mmol/L (ref 136–145)
Total Protein: 7.3 g/dL (ref 6.4–8.2)

## 2013-02-15 LAB — CANCER ANTIGEN 27.29: CA 27.29: 27.7 U/mL (ref 0.0–38.6)

## 2013-02-24 ENCOUNTER — Ambulatory Visit: Payer: Self-pay | Admitting: Oncology

## 2013-02-27 ENCOUNTER — Encounter: Payer: Self-pay | Admitting: General Surgery

## 2013-02-27 ENCOUNTER — Ambulatory Visit: Payer: Self-pay | Admitting: General Surgery

## 2013-02-27 DIAGNOSIS — R922 Inconclusive mammogram: Secondary | ICD-10-CM | POA: Diagnosis not present

## 2013-02-27 DIAGNOSIS — Z853 Personal history of malignant neoplasm of breast: Secondary | ICD-10-CM | POA: Diagnosis not present

## 2013-02-28 ENCOUNTER — Ambulatory Visit: Payer: Self-pay | Admitting: Oncology

## 2013-03-06 ENCOUNTER — Encounter: Payer: Self-pay | Admitting: General Surgery

## 2013-03-06 ENCOUNTER — Ambulatory Visit (INDEPENDENT_AMBULATORY_CARE_PROVIDER_SITE_OTHER): Payer: Medicare Other | Admitting: General Surgery

## 2013-03-06 VITALS — BP 142/76 | HR 72 | Resp 14 | Ht 69.0 in | Wt 176.0 lb

## 2013-03-06 DIAGNOSIS — C50919 Malignant neoplasm of unspecified site of unspecified female breast: Secondary | ICD-10-CM | POA: Diagnosis not present

## 2013-03-06 NOTE — Patient Instructions (Signed)
Continue self breast exams. Call office for any new breast issues or concerns. 

## 2013-03-06 NOTE — Progress Notes (Signed)
[name], female   DOB: 02/15/1945, 68 y.o.   MRN: [id number]  Chief Complaint  Patient presents with  . Follow-up    mammogram, history of breast cancer    HPI Terri Perez is a 68 y.o. female.  who presents for her annual follow up mammogram and breast evaluation. The most recent mammogram was done on 02-27-13. The patient is accompanied today by her husband. Patient does perform regular self breast checks and gets regular mammograms done.  No new breast issues, tolerating Femara. Appointment with Dr Oliva Bustard was in January. Recovering from recent bowel surgery for chronic constipation with what sounds like a subtotal colectomy and ileorectal ostomy at [facility]. The patient reports she is still having about 5-6 bowel movements per day, but no difficulty with incontinence.   HPI  Past Medical History  Diagnosis Date  . Personal history of fibromyalgia 2002  . Unspecified constipation   . H/O cystitis 2011  . Special screening for malignant neoplasms, colon   . Arm fracture     right forearm, d/t fall  . Personal history of malignant neoplasm of large intestine   . Bowel trouble 2010  . Cancer 12/2009    Left UOQ breast tumor; wide excision,sn bx and whole breast radiation. Chemotherapy done. There was only a 31m area of residual tumor remaining. All sn were negative. This was ER-positive, PR-negative, HER-2 neu not over expressed tumor. The original ultrasound size was 2.6 cm(T2).  . 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 On wide excision 3 mm area of residual tumor. Sentinel node negative. ER-positive, PR-negative, HER-2 do not overexpressing.    Past Surgical History  Procedure Laterality Date  . Colonoscopy  2010    Dr. WAllen Norris normal  . Portacath placement  2011  . Port-a-cath removal  2013  . Breast surgery Left 2011    wide excision  . Breast biopsy Left 2011  .  Breast cyst aspiration Left 1985  . Skin cancer excision  1980    face  . Tubal ligation  1973  . Abdominal hysterectomy  1974  . Back surgery  1985    bone spurs between 5&6, used bone from left hip  . Colon resection  Sept 2014    Terri Perez    Family History  Problem Relation Age of Onset  . Cancer Brother     colon  . Cancer Other     breast and ovarian cancers, relationships not listed  . Cancer Other 547   niece    Social History History  Substance Use Topics  . Smoking status: Never Smoker   . Smokeless tobacco: Not on file  . Alcohol Use: No    Allergies  Allergen Reactions  . Milk-Related Compounds   . Oxycodone-Acetaminophen   . Vicodin [Hydrocodone-Acetaminophen] Nausea Only  . Wheat Bran   . Tape Rash    Rash from steri strips    Current Outpatient Prescriptions  Medication Sig Dispense Refill  . ALPRAZolam (XANAX) 0.25 MG tablet Take 0.25 mg by mouth at bedtime as needed for anxiety.      .Marland Kitchenletrozole (FEMARA) 2.5 MG tablet Take 2.5 mg by mouth daily.      . mirtazapine (REMERON) 15 MG tablet Take 15 mg by mouth at bedtime.      . ondansetron (ZOFRAN-ODT) 4 MG disintegrating tablet Take 4 mg by mouth every 8 (eight)  hours as needed for nausea or vomiting.      . psyllium (METAMUCIL) 58.6 % powder Take 1 packet by mouth as needed.       No current facility-administered medications for this visit.    Review of Systems Review of Systems  Constitutional: Negative.   Cardiovascular: Negative.   Gastrointestinal: Positive for diarrhea.    Blood pressure 142/76, pulse 72, resp. rate 14, height 5' 9" (1.753 m), weight 176 lb (79.833 kg).  Physical Exam Physical Exam  Constitutional: She is oriented to person, place, and time. She appears well-developed and well-nourished.  Neck: Neck supple.  Cardiovascular: Normal rate, regular rhythm and normal heart sounds.   Pulmonary/Chest: Effort normal and breath sounds normal. Right breast exhibits no inverted  nipple, no mass, no nipple discharge, no skin change and no tenderness. Left breast exhibits no inverted nipple, no mass, no nipple discharge, no skin change and no tenderness.  Well healed scar top of areolar and going laterally left breast  Lymphadenopathy:    She has no cervical adenopathy.    She has no axillary adenopathy.  Neurological: She is alert and oriented to person, place, and time.  Skin: Skin is warm and dry.    Data Reviewed Mammograms dated February 27, 2013 were reviewed. Stable scarring in the left breast is identified. BI-RAD-2.  Assessment    Doing well now 3 years status post breast conservation for pretreatment T2, posttreatment T1a carcinoma left breast.     Plan    The patient will continue on her Letrazol as prescribed by Dr. Oliva Bustard.  Ongoing use of a stool bulking agents was reviewed and encouraged. She is just about 6 months out from her colon surgery and should expect continued improvement of bowel function.        Robert Bellow 03/07/2013, 8:24 PM

## 2013-03-07 ENCOUNTER — Encounter: Payer: Self-pay | Admitting: General Surgery

## 2013-03-07 DIAGNOSIS — C50919 Malignant neoplasm of unspecified site of unspecified female breast: Secondary | ICD-10-CM | POA: Insufficient documentation

## 2013-03-20 ENCOUNTER — Encounter: Payer: Self-pay | Admitting: General Surgery

## 2013-04-04 DIAGNOSIS — H2510 Age-related nuclear cataract, unspecified eye: Secondary | ICD-10-CM | POA: Diagnosis not present

## 2013-04-30 DIAGNOSIS — C50919 Malignant neoplasm of unspecified site of unspecified female breast: Secondary | ICD-10-CM | POA: Diagnosis not present

## 2013-04-30 DIAGNOSIS — M129 Arthropathy, unspecified: Secondary | ICD-10-CM | POA: Diagnosis not present

## 2013-04-30 DIAGNOSIS — IMO0001 Reserved for inherently not codable concepts without codable children: Secondary | ICD-10-CM | POA: Diagnosis not present

## 2013-04-30 DIAGNOSIS — M76899 Other specified enthesopathies of unspecified lower limb, excluding foot: Secondary | ICD-10-CM | POA: Diagnosis not present

## 2013-04-30 DIAGNOSIS — F411 Generalized anxiety disorder: Secondary | ICD-10-CM | POA: Diagnosis not present

## 2013-05-03 DIAGNOSIS — M76899 Other specified enthesopathies of unspecified lower limb, excluding foot: Secondary | ICD-10-CM | POA: Diagnosis not present

## 2013-05-15 ENCOUNTER — Ambulatory Visit: Payer: Medicare Other | Admitting: Podiatry

## 2013-05-17 DIAGNOSIS — M76899 Other specified enthesopathies of unspecified lower limb, excluding foot: Secondary | ICD-10-CM | POA: Diagnosis not present

## 2013-06-13 DIAGNOSIS — IMO0001 Reserved for inherently not codable concepts without codable children: Secondary | ICD-10-CM | POA: Diagnosis not present

## 2013-06-13 DIAGNOSIS — F320 Major depressive disorder, single episode, mild: Secondary | ICD-10-CM | POA: Diagnosis not present

## 2013-06-13 DIAGNOSIS — M76899 Other specified enthesopathies of unspecified lower limb, excluding foot: Secondary | ICD-10-CM | POA: Diagnosis not present

## 2013-06-13 DIAGNOSIS — Z1342 Encounter for screening for global developmental delays (milestones): Secondary | ICD-10-CM | POA: Diagnosis not present

## 2013-06-13 DIAGNOSIS — C50919 Malignant neoplasm of unspecified site of unspecified female breast: Secondary | ICD-10-CM | POA: Diagnosis not present

## 2013-06-13 DIAGNOSIS — Z1331 Encounter for screening for depression: Secondary | ICD-10-CM | POA: Diagnosis not present

## 2013-06-13 DIAGNOSIS — F411 Generalized anxiety disorder: Secondary | ICD-10-CM | POA: Diagnosis not present

## 2013-06-13 DIAGNOSIS — Z1330 Encounter for screening examination for mental health and behavioral disorders, unspecified: Secondary | ICD-10-CM | POA: Diagnosis not present

## 2013-06-21 DIAGNOSIS — M76899 Other specified enthesopathies of unspecified lower limb, excluding foot: Secondary | ICD-10-CM | POA: Diagnosis not present

## 2013-06-25 DIAGNOSIS — IMO0001 Reserved for inherently not codable concepts without codable children: Secondary | ICD-10-CM | POA: Diagnosis not present

## 2013-06-25 DIAGNOSIS — C50919 Malignant neoplasm of unspecified site of unspecified female breast: Secondary | ICD-10-CM | POA: Diagnosis not present

## 2013-06-25 DIAGNOSIS — Z1330 Encounter for screening examination for mental health and behavioral disorders, unspecified: Secondary | ICD-10-CM | POA: Diagnosis not present

## 2013-06-25 DIAGNOSIS — F411 Generalized anxiety disorder: Secondary | ICD-10-CM | POA: Diagnosis not present

## 2013-06-25 DIAGNOSIS — Z1331 Encounter for screening for depression: Secondary | ICD-10-CM | POA: Diagnosis not present

## 2013-06-25 DIAGNOSIS — M779 Enthesopathy, unspecified: Secondary | ICD-10-CM | POA: Diagnosis not present

## 2013-06-25 DIAGNOSIS — M129 Arthropathy, unspecified: Secondary | ICD-10-CM | POA: Diagnosis not present

## 2013-06-25 DIAGNOSIS — Z1342 Encounter for screening for global developmental delays (milestones): Secondary | ICD-10-CM | POA: Diagnosis not present

## 2013-07-31 ENCOUNTER — Ambulatory Visit: Payer: Self-pay | Admitting: Family Medicine

## 2013-07-31 DIAGNOSIS — Z1342 Encounter for screening for global developmental delays (milestones): Secondary | ICD-10-CM | POA: Diagnosis not present

## 2013-07-31 DIAGNOSIS — F411 Generalized anxiety disorder: Secondary | ICD-10-CM | POA: Diagnosis not present

## 2013-07-31 DIAGNOSIS — M542 Cervicalgia: Secondary | ICD-10-CM | POA: Diagnosis not present

## 2013-07-31 DIAGNOSIS — M5412 Radiculopathy, cervical region: Secondary | ICD-10-CM | POA: Diagnosis not present

## 2013-07-31 DIAGNOSIS — M129 Arthropathy, unspecified: Secondary | ICD-10-CM | POA: Diagnosis not present

## 2013-07-31 DIAGNOSIS — M5030 Other cervical disc degeneration, unspecified cervical region: Secondary | ICD-10-CM | POA: Diagnosis not present

## 2013-07-31 DIAGNOSIS — C50919 Malignant neoplasm of unspecified site of unspecified female breast: Secondary | ICD-10-CM | POA: Diagnosis not present

## 2013-07-31 DIAGNOSIS — M25519 Pain in unspecified shoulder: Secondary | ICD-10-CM | POA: Diagnosis not present

## 2013-07-31 DIAGNOSIS — Z1330 Encounter for screening examination for mental health and behavioral disorders, unspecified: Secondary | ICD-10-CM | POA: Diagnosis not present

## 2013-07-31 DIAGNOSIS — Z1331 Encounter for screening for depression: Secondary | ICD-10-CM | POA: Diagnosis not present

## 2013-09-05 ENCOUNTER — Ambulatory Visit: Payer: Self-pay | Admitting: Oncology

## 2013-09-05 DIAGNOSIS — Z79811 Long term (current) use of aromatase inhibitors: Secondary | ICD-10-CM | POA: Diagnosis not present

## 2013-09-05 DIAGNOSIS — M2550 Pain in unspecified joint: Secondary | ICD-10-CM | POA: Diagnosis not present

## 2013-09-05 DIAGNOSIS — C50919 Malignant neoplasm of unspecified site of unspecified female breast: Secondary | ICD-10-CM | POA: Diagnosis not present

## 2013-09-05 DIAGNOSIS — Z170 Estrogen receptor positive status [ER+]: Secondary | ICD-10-CM | POA: Diagnosis not present

## 2013-09-05 LAB — CBC CANCER CENTER
Basophil #: 0 x10 3/mm (ref 0.0–0.1)
Basophil %: 0.6 %
EOS ABS: 0.2 x10 3/mm (ref 0.0–0.7)
Eosinophil %: 2.7 %
HCT: 38.3 % (ref 35.0–47.0)
HGB: 12.5 g/dL (ref 12.0–16.0)
Lymphocyte #: 1.8 x10 3/mm (ref 1.0–3.6)
Lymphocyte %: 31.1 %
MCH: 31.7 pg (ref 26.0–34.0)
MCHC: 32.6 g/dL (ref 32.0–36.0)
MCV: 97 fL (ref 80–100)
MONOS PCT: 7.5 %
Monocyte #: 0.4 x10 3/mm (ref 0.2–0.9)
NEUTROS PCT: 58.1 %
Neutrophil #: 3.4 x10 3/mm (ref 1.4–6.5)
PLATELETS: 232 x10 3/mm (ref 150–440)
RBC: 3.93 10*6/uL (ref 3.80–5.20)
RDW: 13.2 % (ref 11.5–14.5)
WBC: 5.9 x10 3/mm (ref 3.6–11.0)

## 2013-09-05 LAB — COMPREHENSIVE METABOLIC PANEL
ALBUMIN: 3.6 g/dL (ref 3.4–5.0)
ALK PHOS: 95 U/L
ALT: 21 U/L
Anion Gap: 8 (ref 7–16)
BUN: 20 mg/dL — ABNORMAL HIGH (ref 7–18)
Bilirubin,Total: 0.3 mg/dL (ref 0.2–1.0)
CREATININE: 1.12 mg/dL (ref 0.60–1.30)
Calcium, Total: 9.3 mg/dL (ref 8.5–10.1)
Chloride: 104 mmol/L (ref 98–107)
Co2: 28 mmol/L (ref 21–32)
EGFR (African American): 58 — ABNORMAL LOW
EGFR (Non-African Amer.): 50 — ABNORMAL LOW
GLUCOSE: 89 mg/dL (ref 65–99)
Osmolality: 281 (ref 275–301)
Potassium: 4.2 mmol/L (ref 3.5–5.1)
SGOT(AST): 13 U/L — ABNORMAL LOW (ref 15–37)
SODIUM: 140 mmol/L (ref 136–145)
TOTAL PROTEIN: 7.3 g/dL (ref 6.4–8.2)

## 2013-09-24 ENCOUNTER — Ambulatory Visit: Payer: Self-pay | Admitting: Oncology

## 2013-09-24 DIAGNOSIS — R5383 Other fatigue: Secondary | ICD-10-CM | POA: Diagnosis not present

## 2013-09-24 DIAGNOSIS — Z800 Family history of malignant neoplasm of digestive organs: Secondary | ICD-10-CM | POA: Diagnosis not present

## 2013-09-24 DIAGNOSIS — F411 Generalized anxiety disorder: Secondary | ICD-10-CM | POA: Diagnosis not present

## 2013-09-24 DIAGNOSIS — IMO0001 Reserved for inherently not codable concepts without codable children: Secondary | ICD-10-CM | POA: Diagnosis not present

## 2013-09-24 DIAGNOSIS — F3289 Other specified depressive episodes: Secondary | ICD-10-CM | POA: Diagnosis not present

## 2013-09-24 DIAGNOSIS — C50919 Malignant neoplasm of unspecified site of unspecified female breast: Secondary | ICD-10-CM | POA: Diagnosis not present

## 2013-09-24 DIAGNOSIS — Z90710 Acquired absence of both cervix and uterus: Secondary | ICD-10-CM | POA: Diagnosis not present

## 2013-09-24 DIAGNOSIS — Z9181 History of falling: Secondary | ICD-10-CM | POA: Diagnosis not present

## 2013-09-24 DIAGNOSIS — K5900 Constipation, unspecified: Secondary | ICD-10-CM | POA: Diagnosis not present

## 2013-09-24 DIAGNOSIS — F329 Major depressive disorder, single episode, unspecified: Secondary | ICD-10-CM | POA: Diagnosis not present

## 2013-09-24 DIAGNOSIS — Z85828 Personal history of other malignant neoplasm of skin: Secondary | ICD-10-CM | POA: Diagnosis not present

## 2013-09-24 DIAGNOSIS — R5381 Other malaise: Secondary | ICD-10-CM | POA: Diagnosis not present

## 2013-09-24 DIAGNOSIS — Z79810 Long term (current) use of selective estrogen receptor modulators (SERMs): Secondary | ICD-10-CM | POA: Diagnosis not present

## 2013-09-24 DIAGNOSIS — Z79899 Other long term (current) drug therapy: Secondary | ICD-10-CM | POA: Diagnosis not present

## 2013-09-24 DIAGNOSIS — Z170 Estrogen receptor positive status [ER+]: Secondary | ICD-10-CM | POA: Diagnosis not present

## 2013-10-03 DIAGNOSIS — C50919 Malignant neoplasm of unspecified site of unspecified female breast: Secondary | ICD-10-CM | POA: Diagnosis not present

## 2013-10-03 DIAGNOSIS — Z170 Estrogen receptor positive status [ER+]: Secondary | ICD-10-CM | POA: Diagnosis not present

## 2013-10-03 DIAGNOSIS — R5381 Other malaise: Secondary | ICD-10-CM | POA: Diagnosis not present

## 2013-10-03 DIAGNOSIS — Z79810 Long term (current) use of selective estrogen receptor modulators (SERMs): Secondary | ICD-10-CM | POA: Diagnosis not present

## 2013-10-24 ENCOUNTER — Ambulatory Visit: Payer: Self-pay | Admitting: Oncology

## 2013-10-27 ENCOUNTER — Emergency Department: Payer: Self-pay | Admitting: Internal Medicine

## 2013-10-27 DIAGNOSIS — S300XXA Contusion of lower back and pelvis, initial encounter: Secondary | ICD-10-CM | POA: Diagnosis not present

## 2013-10-27 DIAGNOSIS — M5136 Other intervertebral disc degeneration, lumbar region: Secondary | ICD-10-CM | POA: Diagnosis not present

## 2013-10-27 DIAGNOSIS — S7002XA Contusion of left hip, initial encounter: Secondary | ICD-10-CM | POA: Diagnosis not present

## 2013-10-27 DIAGNOSIS — R262 Difficulty in walking, not elsewhere classified: Secondary | ICD-10-CM | POA: Diagnosis not present

## 2013-10-27 DIAGNOSIS — M25552 Pain in left hip: Secondary | ICD-10-CM | POA: Diagnosis not present

## 2013-10-27 DIAGNOSIS — S3982XA Other specified injuries of lower back, initial encounter: Secondary | ICD-10-CM | POA: Diagnosis not present

## 2013-10-29 ENCOUNTER — Ambulatory Visit: Payer: Self-pay | Admitting: Oncology

## 2013-10-29 DIAGNOSIS — F329 Major depressive disorder, single episode, unspecified: Secondary | ICD-10-CM | POA: Diagnosis not present

## 2013-10-29 DIAGNOSIS — M791 Myalgia: Secondary | ICD-10-CM | POA: Diagnosis not present

## 2013-10-29 DIAGNOSIS — Z9223 Personal history of estrogen therapy: Secondary | ICD-10-CM | POA: Diagnosis not present

## 2013-10-29 DIAGNOSIS — M2550 Pain in unspecified joint: Secondary | ICD-10-CM | POA: Diagnosis not present

## 2013-10-29 DIAGNOSIS — Z853 Personal history of malignant neoplasm of breast: Secondary | ICD-10-CM | POA: Diagnosis not present

## 2013-10-29 DIAGNOSIS — Z90710 Acquired absence of both cervix and uterus: Secondary | ICD-10-CM | POA: Diagnosis not present

## 2013-10-29 DIAGNOSIS — F410 Panic disorder [episodic paroxysmal anxiety] without agoraphobia: Secondary | ICD-10-CM | POA: Diagnosis not present

## 2013-10-29 DIAGNOSIS — Z170 Estrogen receptor positive status [ER+]: Secondary | ICD-10-CM | POA: Diagnosis not present

## 2013-10-29 DIAGNOSIS — Z79899 Other long term (current) drug therapy: Secondary | ICD-10-CM | POA: Diagnosis not present

## 2013-10-29 DIAGNOSIS — R609 Edema, unspecified: Secondary | ICD-10-CM | POA: Diagnosis not present

## 2013-10-29 DIAGNOSIS — R531 Weakness: Secondary | ICD-10-CM | POA: Diagnosis not present

## 2013-10-29 DIAGNOSIS — M797 Fibromyalgia: Secondary | ICD-10-CM | POA: Diagnosis not present

## 2013-10-29 DIAGNOSIS — Z7952 Long term (current) use of systemic steroids: Secondary | ICD-10-CM | POA: Diagnosis not present

## 2013-10-29 DIAGNOSIS — R5383 Other fatigue: Secondary | ICD-10-CM | POA: Diagnosis not present

## 2013-10-29 DIAGNOSIS — Z9181 History of falling: Secondary | ICD-10-CM | POA: Diagnosis not present

## 2013-10-29 DIAGNOSIS — M549 Dorsalgia, unspecified: Secondary | ICD-10-CM | POA: Diagnosis not present

## 2013-10-29 DIAGNOSIS — R0602 Shortness of breath: Secondary | ICD-10-CM | POA: Diagnosis not present

## 2013-10-30 DIAGNOSIS — M455 Ankylosing spondylitis of thoracolumbar region: Secondary | ICD-10-CM | POA: Diagnosis not present

## 2013-10-30 DIAGNOSIS — M25552 Pain in left hip: Secondary | ICD-10-CM | POA: Diagnosis not present

## 2013-11-08 ENCOUNTER — Ambulatory Visit: Payer: Self-pay | Admitting: Family Medicine

## 2013-11-08 DIAGNOSIS — M479 Spondylosis, unspecified: Secondary | ICD-10-CM | POA: Diagnosis not present

## 2013-11-08 DIAGNOSIS — M25552 Pain in left hip: Secondary | ICD-10-CM | POA: Diagnosis not present

## 2013-11-08 DIAGNOSIS — Z853 Personal history of malignant neoplasm of breast: Secondary | ICD-10-CM | POA: Diagnosis not present

## 2013-11-08 DIAGNOSIS — M545 Low back pain: Secondary | ICD-10-CM | POA: Diagnosis not present

## 2013-11-13 DIAGNOSIS — M797 Fibromyalgia: Secondary | ICD-10-CM | POA: Diagnosis not present

## 2013-11-13 DIAGNOSIS — M179 Osteoarthritis of knee, unspecified: Secondary | ICD-10-CM | POA: Diagnosis not present

## 2013-11-13 DIAGNOSIS — C50919 Malignant neoplasm of unspecified site of unspecified female breast: Secondary | ICD-10-CM | POA: Diagnosis not present

## 2013-11-20 ENCOUNTER — Ambulatory Visit: Payer: Self-pay | Admitting: Family Medicine

## 2013-11-20 DIAGNOSIS — S7001XA Contusion of right hip, initial encounter: Secondary | ICD-10-CM | POA: Diagnosis not present

## 2013-11-20 DIAGNOSIS — M84454A Pathological fracture, pelvis, initial encounter for fracture: Secondary | ICD-10-CM | POA: Diagnosis not present

## 2013-11-20 DIAGNOSIS — M6088 Other myositis, other site: Secondary | ICD-10-CM | POA: Diagnosis not present

## 2013-11-20 DIAGNOSIS — R102 Pelvic and perineal pain: Secondary | ICD-10-CM | POA: Diagnosis not present

## 2013-11-24 ENCOUNTER — Ambulatory Visit: Payer: Self-pay | Admitting: Oncology

## 2013-11-25 ENCOUNTER — Encounter: Payer: Self-pay | Admitting: General Surgery

## 2013-12-12 DIAGNOSIS — F329 Major depressive disorder, single episode, unspecified: Secondary | ICD-10-CM | POA: Diagnosis not present

## 2013-12-12 DIAGNOSIS — F411 Generalized anxiety disorder: Secondary | ICD-10-CM | POA: Diagnosis not present

## 2013-12-12 DIAGNOSIS — C50919 Malignant neoplasm of unspecified site of unspecified female breast: Secondary | ICD-10-CM | POA: Diagnosis not present

## 2013-12-12 DIAGNOSIS — Z23 Encounter for immunization: Secondary | ICD-10-CM | POA: Diagnosis not present

## 2013-12-12 DIAGNOSIS — G4700 Insomnia, unspecified: Secondary | ICD-10-CM | POA: Diagnosis not present

## 2014-01-02 ENCOUNTER — Ambulatory Visit: Payer: Self-pay | Admitting: Family Medicine

## 2014-01-02 DIAGNOSIS — M7989 Other specified soft tissue disorders: Secondary | ICD-10-CM | POA: Diagnosis not present

## 2014-01-02 DIAGNOSIS — Z23 Encounter for immunization: Secondary | ICD-10-CM | POA: Diagnosis not present

## 2014-01-02 DIAGNOSIS — F411 Generalized anxiety disorder: Secondary | ICD-10-CM | POA: Diagnosis not present

## 2014-01-02 DIAGNOSIS — F329 Major depressive disorder, single episode, unspecified: Secondary | ICD-10-CM | POA: Diagnosis not present

## 2014-01-02 DIAGNOSIS — G4700 Insomnia, unspecified: Secondary | ICD-10-CM | POA: Diagnosis not present

## 2014-01-02 DIAGNOSIS — R600 Localized edema: Secondary | ICD-10-CM | POA: Diagnosis not present

## 2014-01-21 DIAGNOSIS — Z23 Encounter for immunization: Secondary | ICD-10-CM | POA: Diagnosis not present

## 2014-01-21 DIAGNOSIS — G4700 Insomnia, unspecified: Secondary | ICD-10-CM | POA: Diagnosis not present

## 2014-01-21 DIAGNOSIS — R609 Edema, unspecified: Secondary | ICD-10-CM | POA: Diagnosis not present

## 2014-01-21 DIAGNOSIS — M62838 Other muscle spasm: Secondary | ICD-10-CM | POA: Diagnosis not present

## 2014-01-21 DIAGNOSIS — F329 Major depressive disorder, single episode, unspecified: Secondary | ICD-10-CM | POA: Diagnosis not present

## 2014-01-27 DIAGNOSIS — S32110A Nondisplaced Zone I fracture of sacrum, initial encounter for closed fracture: Secondary | ICD-10-CM | POA: Diagnosis not present

## 2014-01-27 DIAGNOSIS — M7071 Other bursitis of hip, right hip: Secondary | ICD-10-CM | POA: Diagnosis not present

## 2014-01-27 DIAGNOSIS — M7072 Other bursitis of hip, left hip: Secondary | ICD-10-CM | POA: Diagnosis not present

## 2014-02-04 DIAGNOSIS — M7072 Other bursitis of hip, left hip: Secondary | ICD-10-CM | POA: Diagnosis not present

## 2014-02-04 DIAGNOSIS — M7071 Other bursitis of hip, right hip: Secondary | ICD-10-CM | POA: Diagnosis not present

## 2014-02-25 DIAGNOSIS — M25551 Pain in right hip: Secondary | ICD-10-CM | POA: Diagnosis not present

## 2014-02-26 ENCOUNTER — Inpatient Hospital Stay: Payer: Self-pay | Admitting: Internal Medicine

## 2014-02-26 DIAGNOSIS — Z96641 Presence of right artificial hip joint: Secondary | ICD-10-CM | POA: Diagnosis not present

## 2014-02-26 DIAGNOSIS — Z853 Personal history of malignant neoplasm of breast: Secondary | ICD-10-CM | POA: Diagnosis not present

## 2014-02-26 DIAGNOSIS — R000 Tachycardia, unspecified: Secondary | ICD-10-CM | POA: Diagnosis not present

## 2014-02-26 DIAGNOSIS — E663 Overweight: Secondary | ICD-10-CM | POA: Diagnosis not present

## 2014-02-26 DIAGNOSIS — M1612 Unilateral primary osteoarthritis, left hip: Secondary | ICD-10-CM | POA: Diagnosis not present

## 2014-02-26 DIAGNOSIS — D62 Acute posthemorrhagic anemia: Secondary | ICD-10-CM | POA: Diagnosis not present

## 2014-02-26 DIAGNOSIS — Z9181 History of falling: Secondary | ICD-10-CM | POA: Diagnosis not present

## 2014-02-26 DIAGNOSIS — Z885 Allergy status to narcotic agent status: Secondary | ICD-10-CM | POA: Diagnosis not present

## 2014-02-26 DIAGNOSIS — Z471 Aftercare following joint replacement surgery: Secondary | ICD-10-CM | POA: Diagnosis not present

## 2014-02-26 DIAGNOSIS — F419 Anxiety disorder, unspecified: Secondary | ICD-10-CM | POA: Diagnosis present

## 2014-02-26 DIAGNOSIS — M1990 Unspecified osteoarthritis, unspecified site: Secondary | ICD-10-CM | POA: Diagnosis present

## 2014-02-26 DIAGNOSIS — Z6827 Body mass index (BMI) 27.0-27.9, adult: Secondary | ICD-10-CM | POA: Diagnosis not present

## 2014-02-26 DIAGNOSIS — S72001D Fracture of unspecified part of neck of right femur, subsequent encounter for closed fracture with routine healing: Secondary | ICD-10-CM | POA: Diagnosis not present

## 2014-02-26 DIAGNOSIS — M166 Other bilateral secondary osteoarthritis of hip: Secondary | ICD-10-CM | POA: Diagnosis not present

## 2014-02-26 DIAGNOSIS — R339 Retention of urine, unspecified: Secondary | ICD-10-CM | POA: Diagnosis not present

## 2014-02-26 DIAGNOSIS — Z883 Allergy status to other anti-infective agents status: Secondary | ICD-10-CM | POA: Diagnosis not present

## 2014-02-26 DIAGNOSIS — B370 Candidal stomatitis: Secondary | ICD-10-CM | POA: Diagnosis not present

## 2014-02-26 DIAGNOSIS — Z01818 Encounter for other preprocedural examination: Secondary | ICD-10-CM | POA: Diagnosis not present

## 2014-02-26 DIAGNOSIS — M797 Fibromyalgia: Secondary | ICD-10-CM | POA: Diagnosis present

## 2014-02-26 DIAGNOSIS — S72041A Displaced fracture of base of neck of right femur, initial encounter for closed fracture: Secondary | ICD-10-CM | POA: Diagnosis not present

## 2014-02-26 DIAGNOSIS — R2689 Other abnormalities of gait and mobility: Secondary | ICD-10-CM | POA: Diagnosis not present

## 2014-02-26 DIAGNOSIS — S72001A Fracture of unspecified part of neck of right femur, initial encounter for closed fracture: Secondary | ICD-10-CM | POA: Diagnosis not present

## 2014-02-26 DIAGNOSIS — M810 Age-related osteoporosis without current pathological fracture: Secondary | ICD-10-CM | POA: Diagnosis not present

## 2014-02-26 DIAGNOSIS — R531 Weakness: Secondary | ICD-10-CM | POA: Diagnosis not present

## 2014-02-26 DIAGNOSIS — J189 Pneumonia, unspecified organism: Secondary | ICD-10-CM | POA: Diagnosis not present

## 2014-02-26 DIAGNOSIS — S72031A Displaced midcervical fracture of right femur, initial encounter for closed fracture: Secondary | ICD-10-CM | POA: Diagnosis not present

## 2014-02-26 DIAGNOSIS — B379 Candidiasis, unspecified: Secondary | ICD-10-CM | POA: Diagnosis present

## 2014-02-26 DIAGNOSIS — M6281 Muscle weakness (generalized): Secondary | ICD-10-CM | POA: Diagnosis not present

## 2014-02-26 DIAGNOSIS — F329 Major depressive disorder, single episode, unspecified: Secondary | ICD-10-CM | POA: Diagnosis present

## 2014-02-26 LAB — COMPREHENSIVE METABOLIC PANEL
ALT: 23 U/L (ref 14–63)
ANION GAP: 9 (ref 7–16)
Albumin: 3.2 g/dL — ABNORMAL LOW (ref 3.4–5.0)
Alkaline Phosphatase: 140 U/L — ABNORMAL HIGH (ref 46–116)
BILIRUBIN TOTAL: 0.3 mg/dL (ref 0.2–1.0)
BUN: 30 mg/dL — ABNORMAL HIGH (ref 7–18)
CHLORIDE: 103 mmol/L (ref 98–107)
CO2: 27 mmol/L (ref 21–32)
Calcium, Total: 9.9 mg/dL (ref 8.5–10.1)
Creatinine: 1.07 mg/dL (ref 0.60–1.30)
EGFR (African American): >60
GFR CALC NON AF AMER: 54 — AB
Glucose: 103 mg/dL — ABNORMAL HIGH (ref 65–99)
OSMOLALITY: 284 (ref 275–301)
POTASSIUM: 3.7 mmol/L (ref 3.5–5.1)
SGOT(AST): 16 U/L (ref 15–37)
Sodium: 139 mmol/L (ref 136–145)
TOTAL PROTEIN: 7.2 g/dL (ref 6.4–8.2)

## 2014-02-26 LAB — PROTIME-INR
INR: 0.9
Prothrombin Time: 12.7 secs

## 2014-02-26 LAB — URINALYSIS, COMPLETE
BLOOD: NEGATIVE
Bacteria: NONE SEEN
Bilirubin,UR: NEGATIVE
Glucose,UR: NEGATIVE mg/dL (ref 0–75)
Ketone: NEGATIVE
LEUKOCYTE ESTERASE: NEGATIVE
NITRITE: NEGATIVE
PH: 5 (ref 4.5–8.0)
PROTEIN: NEGATIVE
RBC,UR: 1 /HPF (ref 0–5)
Specific Gravity: 1.025 (ref 1.003–1.030)
Squamous Epithelial: <1
WBC UR: 1 /HPF (ref 0–5)

## 2014-02-26 LAB — CBC WITH DIFFERENTIAL/PLATELET
BASOS ABS: 0 10*3/uL (ref 0.0–0.1)
Basophil %: 0.5 %
Eosinophil #: 0.2 10*3/uL (ref 0.0–0.7)
Eosinophil %: 2.4 %
HCT: 37.3 % (ref 35.0–47.0)
HGB: 12.6 g/dL (ref 12.0–16.0)
LYMPHS PCT: 16.1 %
Lymphocyte #: 1.2 10*3/uL (ref 1.0–3.6)
MCH: 31.7 pg (ref 26.0–34.0)
MCHC: 33.9 g/dL (ref 32.0–36.0)
MCV: 93 fL (ref 80–100)
MONO ABS: 0.5 x10 3/mm (ref 0.2–0.9)
Monocyte %: 6.7 %
NEUTROS ABS: 5.5 10*3/uL (ref 1.4–6.5)
Neutrophil %: 74.3 %
Platelet: 208 10*3/uL (ref 150–440)
RBC: 3.99 10*6/uL (ref 3.80–5.20)
RDW: 14.5 % (ref 11.5–14.5)
WBC: 7.4 10*3/uL (ref 3.6–11.0)

## 2014-02-26 LAB — APTT: Activated PTT: 24.6 secs (ref 23.6–35.9)

## 2014-02-27 LAB — BASIC METABOLIC PANEL
ANION GAP: 5 — AB (ref 7–16)
BUN: 16 mg/dL (ref 7–18)
CHLORIDE: 101 mmol/L (ref 98–107)
CREATININE: 0.74 mg/dL (ref 0.60–1.30)
Calcium, Total: 8.5 mg/dL (ref 8.5–10.1)
Co2: 29 mmol/L (ref 21–32)
EGFR (African American): >60
Glucose: 111 mg/dL — ABNORMAL HIGH (ref 65–99)
Osmolality: 272 (ref 275–301)
Potassium: 3.9 mmol/L (ref 3.5–5.1)
SODIUM: 135 mmol/L — AB (ref 136–145)

## 2014-02-27 LAB — CBC WITH DIFFERENTIAL/PLATELET
Basophil #: 0 10*3/uL (ref 0.0–0.1)
Basophil %: 0.1 %
Eosinophil #: 0 10*3/uL (ref 0.0–0.7)
Eosinophil %: 0.6 %
HCT: 31.6 % — ABNORMAL LOW (ref 35.0–47.0)
HGB: 10.6 g/dL — ABNORMAL LOW (ref 12.0–16.0)
Lymphocyte #: 0.6 10*3/uL — ABNORMAL LOW (ref 1.0–3.6)
Lymphocyte %: 7.6 %
MCH: 31.2 pg (ref 26.0–34.0)
MCHC: 33.6 g/dL (ref 32.0–36.0)
MCV: 93 fL (ref 80–100)
Monocyte #: 0.7 x10 3/mm (ref 0.2–0.9)
Monocyte %: 8.5 %
NEUTROS PCT: 83.2 %
Neutrophil #: 6.5 10*3/uL (ref 1.4–6.5)
Platelet: 180 10*3/uL (ref 150–440)
RBC: 3.4 10*6/uL — AB (ref 3.80–5.20)
RDW: 14.1 % (ref 11.5–14.5)
WBC: 7.8 10*3/uL (ref 3.6–11.0)

## 2014-02-27 LAB — HEMOGLOBIN A1C: Hemoglobin A1C: 6 % (ref 4.2–6.3)

## 2014-02-27 LAB — URINE CULTURE

## 2014-02-27 LAB — TSH: Thyroid Stimulating Horm: 0.559 u[IU]/mL

## 2014-02-28 ENCOUNTER — Encounter: Payer: Self-pay | Admitting: Internal Medicine

## 2014-02-28 LAB — CBC WITH DIFFERENTIAL/PLATELET
BASOS PCT: 0.1 %
Basophil #: 0 10*3/uL (ref 0.0–0.1)
Eosinophil #: 0.1 10*3/uL (ref 0.0–0.7)
Eosinophil %: 1 %
HCT: 30.4 % — ABNORMAL LOW (ref 35.0–47.0)
HGB: 9.9 g/dL — ABNORMAL LOW (ref 12.0–16.0)
LYMPHS PCT: 9.9 %
Lymphocyte #: 0.8 10*3/uL — ABNORMAL LOW (ref 1.0–3.6)
MCH: 30.9 pg (ref 26.0–34.0)
MCHC: 32.6 g/dL (ref 32.0–36.0)
MCV: 95 fL (ref 80–100)
Monocyte #: 0.8 x10 3/mm (ref 0.2–0.9)
Monocyte %: 10.3 %
NEUTROS ABS: 6.3 10*3/uL (ref 1.4–6.5)
Neutrophil %: 78.7 %
Platelet: 164 10*3/uL (ref 150–440)
RBC: 3.21 10*6/uL — AB (ref 3.80–5.20)
RDW: 14.2 % (ref 11.5–14.5)
WBC: 8 10*3/uL (ref 3.6–11.0)

## 2014-03-01 DIAGNOSIS — R531 Weakness: Secondary | ICD-10-CM | POA: Diagnosis not present

## 2014-03-01 DIAGNOSIS — J180 Bronchopneumonia, unspecified organism: Secondary | ICD-10-CM | POA: Diagnosis not present

## 2014-03-01 DIAGNOSIS — M6281 Muscle weakness (generalized): Secondary | ICD-10-CM | POA: Diagnosis not present

## 2014-03-01 DIAGNOSIS — F329 Major depressive disorder, single episode, unspecified: Secondary | ICD-10-CM | POA: Diagnosis not present

## 2014-03-01 DIAGNOSIS — M7071 Other bursitis of hip, right hip: Secondary | ICD-10-CM | POA: Diagnosis not present

## 2014-03-01 DIAGNOSIS — J189 Pneumonia, unspecified organism: Secondary | ICD-10-CM | POA: Diagnosis not present

## 2014-03-01 DIAGNOSIS — Z471 Aftercare following joint replacement surgery: Secondary | ICD-10-CM | POA: Diagnosis not present

## 2014-03-01 DIAGNOSIS — R339 Retention of urine, unspecified: Secondary | ICD-10-CM | POA: Diagnosis not present

## 2014-03-01 DIAGNOSIS — M1612 Unilateral primary osteoarthritis, left hip: Secondary | ICD-10-CM | POA: Diagnosis not present

## 2014-03-01 DIAGNOSIS — R609 Edema, unspecified: Secondary | ICD-10-CM | POA: Diagnosis not present

## 2014-03-01 DIAGNOSIS — S72001A Fracture of unspecified part of neck of right femur, initial encounter for closed fracture: Secondary | ICD-10-CM | POA: Diagnosis not present

## 2014-03-01 DIAGNOSIS — G4700 Insomnia, unspecified: Secondary | ICD-10-CM | POA: Diagnosis not present

## 2014-03-01 DIAGNOSIS — B370 Candidal stomatitis: Secondary | ICD-10-CM | POA: Diagnosis not present

## 2014-03-01 DIAGNOSIS — R2689 Other abnormalities of gait and mobility: Secondary | ICD-10-CM | POA: Diagnosis not present

## 2014-03-01 DIAGNOSIS — Z9181 History of falling: Secondary | ICD-10-CM | POA: Diagnosis not present

## 2014-03-01 DIAGNOSIS — Z96641 Presence of right artificial hip joint: Secondary | ICD-10-CM | POA: Diagnosis not present

## 2014-03-01 DIAGNOSIS — F419 Anxiety disorder, unspecified: Secondary | ICD-10-CM | POA: Diagnosis not present

## 2014-03-01 DIAGNOSIS — M797 Fibromyalgia: Secondary | ICD-10-CM | POA: Diagnosis not present

## 2014-03-01 DIAGNOSIS — M810 Age-related osteoporosis without current pathological fracture: Secondary | ICD-10-CM | POA: Diagnosis not present

## 2014-03-01 DIAGNOSIS — M7072 Other bursitis of hip, left hip: Secondary | ICD-10-CM | POA: Diagnosis not present

## 2014-03-01 LAB — CBC WITH DIFFERENTIAL/PLATELET
BASOS PCT: 0.2 %
Basophil #: 0 10*3/uL (ref 0.0–0.1)
EOS ABS: 0 10*3/uL (ref 0.0–0.7)
EOS PCT: 0.4 %
HCT: 30.8 % — AB (ref 35.0–47.0)
HGB: 10 g/dL — AB (ref 12.0–16.0)
LYMPHS PCT: 6.7 %
Lymphocyte #: 0.4 10*3/uL — ABNORMAL LOW (ref 1.0–3.6)
MCH: 30.5 pg (ref 26.0–34.0)
MCHC: 32.3 g/dL (ref 32.0–36.0)
MCV: 95 fL (ref 80–100)
Monocyte #: 0.5 x10 3/mm (ref 0.2–0.9)
Monocyte %: 7.7 %
NEUTROS ABS: 5.6 10*3/uL (ref 1.4–6.5)
Neutrophil %: 85 %
Platelet: 188 10*3/uL (ref 150–440)
RBC: 3.26 10*6/uL — ABNORMAL LOW (ref 3.80–5.20)
RDW: 14 % (ref 11.5–14.5)
WBC: 6.6 10*3/uL (ref 3.6–11.0)

## 2014-03-03 DIAGNOSIS — M810 Age-related osteoporosis without current pathological fracture: Secondary | ICD-10-CM | POA: Diagnosis not present

## 2014-03-03 DIAGNOSIS — F329 Major depressive disorder, single episode, unspecified: Secondary | ICD-10-CM | POA: Diagnosis not present

## 2014-03-03 DIAGNOSIS — J180 Bronchopneumonia, unspecified organism: Secondary | ICD-10-CM | POA: Diagnosis not present

## 2014-03-20 ENCOUNTER — Ambulatory Visit: Payer: PRIVATE HEALTH INSURANCE | Admitting: General Surgery

## 2014-03-24 DIAGNOSIS — M7071 Other bursitis of hip, right hip: Secondary | ICD-10-CM | POA: Diagnosis not present

## 2014-03-24 DIAGNOSIS — M7072 Other bursitis of hip, left hip: Secondary | ICD-10-CM | POA: Diagnosis not present

## 2014-03-25 ENCOUNTER — Encounter
Admit: 2014-03-25 | Disposition: A | Discharge status: Home / Self Care | Payer: Self-pay | Attending: Internal Medicine | Admitting: Internal Medicine

## 2014-03-25 DIAGNOSIS — R609 Edema, unspecified: Secondary | ICD-10-CM | POA: Diagnosis not present

## 2014-03-25 HISTORY — PX: FEMUR FRACTURE SURGERY: SHX633

## 2014-03-31 DIAGNOSIS — W19XXXA Unspecified fall, initial encounter: Secondary | ICD-10-CM | POA: Diagnosis not present

## 2014-03-31 DIAGNOSIS — R110 Nausea: Secondary | ICD-10-CM | POA: Diagnosis not present

## 2014-03-31 DIAGNOSIS — M79604 Pain in right leg: Secondary | ICD-10-CM | POA: Diagnosis not present

## 2014-03-31 DIAGNOSIS — R609 Edema, unspecified: Secondary | ICD-10-CM | POA: Diagnosis not present

## 2014-04-01 ENCOUNTER — Ambulatory Visit (HOSPITAL_COMMUNITY)
Admission: AD | Admit: 2014-04-01 | Discharge: 2014-04-01 | Disposition: A | Discharge status: Home / Self Care | Payer: Medicare Other | Source: Other Acute Inpatient Hospital | Attending: Emergency Medicine | Admitting: Emergency Medicine

## 2014-04-01 ENCOUNTER — Emergency Department: Payer: Self-pay | Admitting: Emergency Medicine

## 2014-04-01 DIAGNOSIS — R531 Weakness: Secondary | ICD-10-CM | POA: Diagnosis not present

## 2014-04-01 DIAGNOSIS — S7221XA Displaced subtrochanteric fracture of right femur, initial encounter for closed fracture: Secondary | ICD-10-CM | POA: Diagnosis present

## 2014-04-01 DIAGNOSIS — S7290XA Unspecified fracture of unspecified femur, initial encounter for closed fracture: Secondary | ICD-10-CM | POA: Insufficient documentation

## 2014-04-01 DIAGNOSIS — S299XXA Unspecified injury of thorax, initial encounter: Secondary | ICD-10-CM | POA: Diagnosis not present

## 2014-04-01 DIAGNOSIS — S72001A Fracture of unspecified part of neck of right femur, initial encounter for closed fracture: Secondary | ICD-10-CM | POA: Diagnosis not present

## 2014-04-01 DIAGNOSIS — Z7409 Other reduced mobility: Secondary | ICD-10-CM | POA: Diagnosis not present

## 2014-04-01 DIAGNOSIS — R000 Tachycardia, unspecified: Secondary | ICD-10-CM | POA: Diagnosis not present

## 2014-04-01 DIAGNOSIS — Z853 Personal history of malignant neoplasm of breast: Secondary | ICD-10-CM | POA: Diagnosis not present

## 2014-04-01 DIAGNOSIS — Z472 Encounter for removal of internal fixation device: Secondary | ICD-10-CM | POA: Diagnosis not present

## 2014-04-01 DIAGNOSIS — F419 Anxiety disorder, unspecified: Secondary | ICD-10-CM | POA: Diagnosis not present

## 2014-04-01 DIAGNOSIS — Z9049 Acquired absence of other specified parts of digestive tract: Secondary | ICD-10-CM | POA: Diagnosis present

## 2014-04-01 DIAGNOSIS — S72331A Displaced oblique fracture of shaft of right femur, initial encounter for closed fracture: Secondary | ICD-10-CM | POA: Diagnosis not present

## 2014-04-01 DIAGNOSIS — M84459D Pathological fracture, hip, unspecified, subsequent encounter for fracture with routine healing: Secondary | ICD-10-CM | POA: Diagnosis not present

## 2014-04-01 DIAGNOSIS — T84040A Periprosthetic fracture around internal prosthetic right hip joint, initial encounter: Secondary | ICD-10-CM | POA: Diagnosis not present

## 2014-04-01 DIAGNOSIS — K219 Gastro-esophageal reflux disease without esophagitis: Secondary | ICD-10-CM | POA: Diagnosis present

## 2014-04-01 DIAGNOSIS — R339 Retention of urine, unspecified: Secondary | ICD-10-CM | POA: Diagnosis not present

## 2014-04-01 DIAGNOSIS — X58XXXA Exposure to other specified factors, initial encounter: Secondary | ICD-10-CM | POA: Insufficient documentation

## 2014-04-01 DIAGNOSIS — M1711 Unilateral primary osteoarthritis, right knee: Secondary | ICD-10-CM | POA: Diagnosis not present

## 2014-04-01 DIAGNOSIS — Z9221 Personal history of antineoplastic chemotherapy: Secondary | ICD-10-CM | POA: Diagnosis not present

## 2014-04-01 DIAGNOSIS — M6281 Muscle weakness (generalized): Secondary | ICD-10-CM | POA: Diagnosis not present

## 2014-04-01 DIAGNOSIS — Z923 Personal history of irradiation: Secondary | ICD-10-CM | POA: Diagnosis not present

## 2014-04-01 DIAGNOSIS — Z809 Family history of malignant neoplasm, unspecified: Secondary | ICD-10-CM | POA: Diagnosis not present

## 2014-04-01 DIAGNOSIS — S72491A Other fracture of lower end of right femur, initial encounter for closed fracture: Secondary | ICD-10-CM | POA: Diagnosis not present

## 2014-04-01 DIAGNOSIS — S72301A Unspecified fracture of shaft of right femur, initial encounter for closed fracture: Secondary | ICD-10-CM | POA: Diagnosis not present

## 2014-04-01 DIAGNOSIS — M797 Fibromyalgia: Secondary | ICD-10-CM | POA: Diagnosis present

## 2014-04-01 DIAGNOSIS — D62 Acute posthemorrhagic anemia: Secondary | ICD-10-CM | POA: Diagnosis not present

## 2014-04-01 DIAGNOSIS — R600 Localized edema: Secondary | ICD-10-CM | POA: Diagnosis not present

## 2014-04-01 DIAGNOSIS — M25551 Pain in right hip: Secondary | ICD-10-CM | POA: Diagnosis not present

## 2014-04-01 DIAGNOSIS — Z9181 History of falling: Secondary | ICD-10-CM | POA: Diagnosis not present

## 2014-04-01 DIAGNOSIS — R2689 Other abnormalities of gait and mobility: Secondary | ICD-10-CM | POA: Diagnosis not present

## 2014-04-01 DIAGNOSIS — M1990 Unspecified osteoarthritis, unspecified site: Secondary | ICD-10-CM | POA: Diagnosis not present

## 2014-04-01 DIAGNOSIS — R410 Disorientation, unspecified: Secondary | ICD-10-CM | POA: Diagnosis not present

## 2014-04-01 DIAGNOSIS — M979XXA Periprosthetic fracture around unspecified internal prosthetic joint, initial encounter: Secondary | ICD-10-CM | POA: Insufficient documentation

## 2014-04-01 DIAGNOSIS — I509 Heart failure, unspecified: Secondary | ICD-10-CM | POA: Diagnosis present

## 2014-04-01 DIAGNOSIS — G8918 Other acute postprocedural pain: Secondary | ICD-10-CM | POA: Diagnosis not present

## 2014-04-01 DIAGNOSIS — S72401A Unspecified fracture of lower end of right femur, initial encounter for closed fracture: Secondary | ICD-10-CM | POA: Diagnosis not present

## 2014-04-01 DIAGNOSIS — T84040D Periprosthetic fracture around internal prosthetic right hip joint, subsequent encounter: Secondary | ICD-10-CM | POA: Diagnosis not present

## 2014-04-01 DIAGNOSIS — S72309A Unspecified fracture of shaft of unspecified femur, initial encounter for closed fracture: Secondary | ICD-10-CM | POA: Insufficient documentation

## 2014-04-01 DIAGNOSIS — S72111A Displaced fracture of greater trochanter of right femur, initial encounter for closed fracture: Secondary | ICD-10-CM | POA: Diagnosis not present

## 2014-04-09 DIAGNOSIS — R5383 Other fatigue: Secondary | ICD-10-CM | POA: Diagnosis not present

## 2014-04-09 DIAGNOSIS — M84459D Pathological fracture, hip, unspecified, subsequent encounter for fracture with routine healing: Secondary | ICD-10-CM | POA: Diagnosis not present

## 2014-04-09 DIAGNOSIS — R531 Weakness: Secondary | ICD-10-CM | POA: Diagnosis not present

## 2014-04-09 DIAGNOSIS — M2550 Pain in unspecified joint: Secondary | ICD-10-CM | POA: Diagnosis not present

## 2014-04-09 DIAGNOSIS — F1320 Sedative, hypnotic or anxiolytic dependence, uncomplicated: Secondary | ICD-10-CM | POA: Diagnosis not present

## 2014-04-09 DIAGNOSIS — Z9181 History of falling: Secondary | ICD-10-CM | POA: Diagnosis not present

## 2014-04-09 DIAGNOSIS — M797 Fibromyalgia: Secondary | ICD-10-CM | POA: Diagnosis not present

## 2014-04-09 DIAGNOSIS — R0602 Shortness of breath: Secondary | ICD-10-CM | POA: Diagnosis not present

## 2014-04-09 DIAGNOSIS — F419 Anxiety disorder, unspecified: Secondary | ICD-10-CM | POA: Diagnosis not present

## 2014-04-09 DIAGNOSIS — M6281 Muscle weakness (generalized): Secondary | ICD-10-CM | POA: Diagnosis not present

## 2014-04-09 DIAGNOSIS — M1990 Unspecified osteoarthritis, unspecified site: Secondary | ICD-10-CM | POA: Diagnosis not present

## 2014-04-09 DIAGNOSIS — R2689 Other abnormalities of gait and mobility: Secondary | ICD-10-CM | POA: Diagnosis not present

## 2014-04-09 DIAGNOSIS — Z885 Allergy status to narcotic agent status: Secondary | ICD-10-CM | POA: Diagnosis not present

## 2014-04-09 DIAGNOSIS — M791 Myalgia: Secondary | ICD-10-CM | POA: Diagnosis not present

## 2014-04-09 DIAGNOSIS — T84040D Periprosthetic fracture around internal prosthetic right hip joint, subsequent encounter: Secondary | ICD-10-CM | POA: Diagnosis not present

## 2014-04-09 DIAGNOSIS — Z96641 Presence of right artificial hip joint: Secondary | ICD-10-CM | POA: Diagnosis not present

## 2014-04-09 DIAGNOSIS — T84040A Periprosthetic fracture around internal prosthetic right hip joint, initial encounter: Secondary | ICD-10-CM | POA: Diagnosis not present

## 2014-04-09 DIAGNOSIS — Z170 Estrogen receptor positive status [ER+]: Secondary | ICD-10-CM | POA: Diagnosis not present

## 2014-04-09 DIAGNOSIS — R339 Retention of urine, unspecified: Secondary | ICD-10-CM | POA: Diagnosis not present

## 2014-04-09 DIAGNOSIS — R609 Edema, unspecified: Secondary | ICD-10-CM | POA: Diagnosis not present

## 2014-04-09 DIAGNOSIS — Z9223 Personal history of estrogen therapy: Secondary | ICD-10-CM | POA: Diagnosis not present

## 2014-04-09 DIAGNOSIS — Z853 Personal history of malignant neoplasm of breast: Secondary | ICD-10-CM | POA: Diagnosis not present

## 2014-04-09 DIAGNOSIS — R600 Localized edema: Secondary | ICD-10-CM | POA: Diagnosis not present

## 2014-04-09 DIAGNOSIS — Z471 Aftercare following joint replacement surgery: Secondary | ICD-10-CM | POA: Diagnosis not present

## 2014-04-09 DIAGNOSIS — K219 Gastro-esophageal reflux disease without esophagitis: Secondary | ICD-10-CM | POA: Diagnosis not present

## 2014-04-09 DIAGNOSIS — Z7409 Other reduced mobility: Secondary | ICD-10-CM | POA: Diagnosis not present

## 2014-04-09 DIAGNOSIS — M549 Dorsalgia, unspecified: Secondary | ICD-10-CM | POA: Diagnosis not present

## 2014-04-10 DIAGNOSIS — M797 Fibromyalgia: Secondary | ICD-10-CM | POA: Diagnosis not present

## 2014-04-10 DIAGNOSIS — T84040A Periprosthetic fracture around internal prosthetic right hip joint, initial encounter: Secondary | ICD-10-CM | POA: Diagnosis not present

## 2014-04-10 DIAGNOSIS — K219 Gastro-esophageal reflux disease without esophagitis: Secondary | ICD-10-CM | POA: Diagnosis not present

## 2014-04-10 DIAGNOSIS — R339 Retention of urine, unspecified: Secondary | ICD-10-CM | POA: Diagnosis not present

## 2014-04-10 DIAGNOSIS — R609 Edema, unspecified: Secondary | ICD-10-CM

## 2014-04-15 DIAGNOSIS — Z96641 Presence of right artificial hip joint: Secondary | ICD-10-CM | POA: Diagnosis not present

## 2014-04-15 DIAGNOSIS — Z471 Aftercare following joint replacement surgery: Secondary | ICD-10-CM | POA: Diagnosis not present

## 2014-04-17 ENCOUNTER — Ambulatory Visit: Payer: Medicare Other | Admitting: General Surgery

## 2014-04-21 DIAGNOSIS — F1320 Sedative, hypnotic or anxiolytic dependence, uncomplicated: Secondary | ICD-10-CM | POA: Diagnosis not present

## 2014-04-21 DIAGNOSIS — F419 Anxiety disorder, unspecified: Secondary | ICD-10-CM | POA: Diagnosis not present

## 2014-04-25 ENCOUNTER — Ambulatory Visit: Admit: 2014-04-25 | Disposition: A | Discharge status: Home / Self Care | Payer: Self-pay | Admitting: Oncology

## 2014-04-25 ENCOUNTER — Encounter
Admit: 2014-04-25 | Disposition: A | Discharge status: Home / Self Care | Payer: Self-pay | Attending: Internal Medicine | Admitting: Internal Medicine

## 2014-05-08 DIAGNOSIS — M797 Fibromyalgia: Secondary | ICD-10-CM | POA: Diagnosis not present

## 2014-05-08 DIAGNOSIS — T84040A Periprosthetic fracture around internal prosthetic right hip joint, initial encounter: Secondary | ICD-10-CM | POA: Diagnosis not present

## 2014-05-08 DIAGNOSIS — K219 Gastro-esophageal reflux disease without esophagitis: Secondary | ICD-10-CM | POA: Diagnosis not present

## 2014-05-08 DIAGNOSIS — R609 Edema, unspecified: Secondary | ICD-10-CM | POA: Diagnosis not present

## 2014-05-09 ENCOUNTER — Other Ambulatory Visit: Payer: Self-pay | Admitting: General Surgery

## 2014-05-09 DIAGNOSIS — Z1231 Encounter for screening mammogram for malignant neoplasm of breast: Secondary | ICD-10-CM

## 2014-05-13 DIAGNOSIS — Z96641 Presence of right artificial hip joint: Secondary | ICD-10-CM | POA: Diagnosis not present

## 2014-05-13 DIAGNOSIS — Z471 Aftercare following joint replacement surgery: Secondary | ICD-10-CM | POA: Diagnosis not present

## 2014-05-13 DIAGNOSIS — Z885 Allergy status to narcotic agent status: Secondary | ICD-10-CM | POA: Diagnosis not present

## 2014-05-15 ENCOUNTER — Ambulatory Visit
Admit: 2014-05-15 | Disposition: A | Discharge status: Home / Self Care | Payer: Self-pay | Attending: Oncology | Admitting: Oncology

## 2014-05-15 DIAGNOSIS — M2550 Pain in unspecified joint: Secondary | ICD-10-CM | POA: Diagnosis not present

## 2014-05-15 DIAGNOSIS — Z853 Personal history of malignant neoplasm of breast: Secondary | ICD-10-CM | POA: Diagnosis not present

## 2014-05-15 DIAGNOSIS — Z170 Estrogen receptor positive status [ER+]: Secondary | ICD-10-CM | POA: Diagnosis not present

## 2014-05-15 DIAGNOSIS — M549 Dorsalgia, unspecified: Secondary | ICD-10-CM | POA: Diagnosis not present

## 2014-05-15 DIAGNOSIS — M791 Myalgia: Secondary | ICD-10-CM | POA: Diagnosis not present

## 2014-05-15 DIAGNOSIS — Z9223 Personal history of estrogen therapy: Secondary | ICD-10-CM | POA: Diagnosis not present

## 2014-05-15 LAB — CBC CANCER CENTER
BASOS ABS: 0 x10 3/mm (ref 0.0–0.1)
Basophil %: 0.5 %
Eosinophil #: 0.2 x10 3/mm (ref 0.0–0.7)
Eosinophil %: 1.9 %
HCT: 36 % (ref 35.0–47.0)
HGB: 11.5 g/dL — AB (ref 12.0–16.0)
Lymphocyte #: 2.2 x10 3/mm (ref 1.0–3.6)
Lymphocyte %: 27.6 %
MCH: 28.7 pg (ref 26.0–34.0)
MCHC: 31.8 g/dL — ABNORMAL LOW (ref 32.0–36.0)
MCV: 90 fL (ref 80–100)
MONOS PCT: 8.3 %
Monocyte #: 0.7 x10 3/mm (ref 0.2–0.9)
Neutrophil #: 4.9 x10 3/mm (ref 1.4–6.5)
Neutrophil %: 61.7 %
Platelet: 323 x10 3/mm (ref 150–440)
RBC: 4 10*6/uL (ref 3.80–5.20)
RDW: 15.8 % — AB (ref 11.5–14.5)
WBC: 7.9 x10 3/mm (ref 3.6–11.0)

## 2014-05-15 LAB — COMPREHENSIVE METABOLIC PANEL
AST: 18 U/L
Albumin: 3.7 g/dL
Alkaline Phosphatase: 163 U/L — ABNORMAL HIGH
Anion Gap: 5 — ABNORMAL LOW (ref 7–16)
BUN: 21 mg/dL — AB
Bilirubin,Total: 0.4 mg/dL
CALCIUM: 9.8 mg/dL
CHLORIDE: 103 mmol/L
Co2: 29 mmol/L
Creatinine: 0.89 mg/dL
Glucose: 91 mg/dL
POTASSIUM: 4.3 mmol/L
SGPT (ALT): 15 U/L
Sodium: 137 mmol/L
Total Protein: 7.3 g/dL

## 2014-05-19 ENCOUNTER — Ambulatory Visit: Payer: Medicare Other | Admitting: General Surgery

## 2014-05-19 LAB — SURGICAL PATHOLOGY

## 2014-05-24 DIAGNOSIS — F419 Anxiety disorder, unspecified: Secondary | ICD-10-CM | POA: Diagnosis not present

## 2014-05-24 DIAGNOSIS — Z96641 Presence of right artificial hip joint: Secondary | ICD-10-CM | POA: Diagnosis not present

## 2014-05-24 DIAGNOSIS — W19XXXD Unspecified fall, subsequent encounter: Secondary | ICD-10-CM | POA: Diagnosis not present

## 2014-05-24 DIAGNOSIS — M1990 Unspecified osteoarthritis, unspecified site: Secondary | ICD-10-CM | POA: Diagnosis not present

## 2014-05-24 DIAGNOSIS — S7291XD Unspecified fracture of right femur, subsequent encounter for closed fracture with routine healing: Secondary | ICD-10-CM | POA: Diagnosis not present

## 2014-05-24 DIAGNOSIS — M797 Fibromyalgia: Secondary | ICD-10-CM | POA: Diagnosis not present

## 2014-05-25 NOTE — Consult Note (Signed)
PATIENT NAME:  Terri Perez, Terri Perez MR#:  [id number] DATE OF BIRTH:  Jun 22, 1945  DATE OF CONSULTATION:  02/26/2014  REFERRING PHYSICIAN:   CONSULTING PHYSICIAN:  Timoteo Gaul, MD  ORTHOPEDIC CONSULT  REASON FOR CONSULTATION: Right femoral neck hip fracture.   HISTORY OF PRESENT ILLNESS: Terri Perez is a 69 year old female who had been having pain in the right hip over the past few days. She found it difficult to bear weight on the right lower extremity, and was using a walker for assistance with ambulation. She had a fall a few weeks ago, but had been not found to have any hip fracture but rather had sustained a pelvic fracture. Last night, she felt pain in the right hip, which radiated into her groin. She was brought to the [facility] Emergency Department where she was diagnosed with a displaced femoral neck hip fracture. She was admitted to the hospitalist service, and orthopedics was consulted for management of this fracture.   PAST MEDICAL HISTORY INCLUDES: Osteoarthritis, history of breast cancer with a history of tamoxifen, depression and fibromyalgia.   PAST SURGICAL HISTORY INCLUDES: Excision of a left wrist neuroma. 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   SOCIAL HISTORY: The patient lives at home with her husband. She does not use tobacco, drink alcohol or use drugs.   HOME MEDICATIONS INCLUDE: Alprazolam 0.5 mg 1 tablet every 6 hours p.r.n. for anxiety and cyclobenzaprine 10 mg 1 tablet p.o. b.i.d. as needed for muscle spasms.   ALLERGIES INCLUDE: NONSTEROIDAL ANTI-INFLAMMATORIES, PERCOCET, AND VICODIN WHICH MAKE HER NAUSEATED.   PHYSICAL EXAMINATION: The patient is lying in bed. She is currently comfortable, in a supine position. Her right leg is in Furman traction.  Her skin is intact overlying the right hip. There is no  erythema, ecchymosis or swelling. Her thigh and leg compartments are soft and compressible. She has a palpable pedal pulse on the right side. She is shortened and slightly externally rotated on the right hip. She has intact sensation to light touch and intact motor function in the right lower extremity.   RADIOLOGY: X-ray films demonstrate a displaced femoral neck hip fracture on the right side. She has evidence of an old pubic rami fracture. There is significant shortening of the right femur.   ASSESSMENT: Displaced right femoral neck hip fracture.   PLAN: I explained to the patient about her fracture. I am recommending a right hip hemiarthroplasty. She will require surgery in order to get back to ambulation, which she does at baseline. I reviewed the risks and benefits of surgery, including infection requiring removal of the prosthesis, bleeding requiring blood transfusion, nerve or blood vessel injury, especially injury to the sciatic nerve leading to numbness or footdrop, leg length discrepancy, change in lower extremity rotation, dislocation, fracture, persistent hip pain and the need for further surgery. Medical risks include, but are not limited to DVT and pulmonary embolism, myocardial infarction, stroke, pneumonia, respiratory failure and death. The patient has been cleared for surgery by the hospitalist service. She is n.p.o.   I reviewed the lab results and x-ray studies in preparation for this case. I have discussed this with Dr. Dustin Flock. We discussed the findings on the chest x-ray, including an undefined hyperintensity in the costophrenic angle. He is going to re-evaluate these x-rays and determine if further workup is necessary prior to surgery.    ____________________________  Timoteo Gaul, MD klk:MT D: 02/26/2014 08:59:48 ET T: 02/26/2014 09:39:44 ET JOB#: [id number]  cc: Timoteo Gaul, MD, <Dictator> Timoteo Gaul MD ELECTRONICALLY SIGNED 03/03/2014 18:24

## 2014-05-25 NOTE — Discharge Summary (Signed)
PATIENT NAME:  Terri Perez, Terri Perez MR#:  [id number] DATE OF BIRTH:  01/09/1946  DATE OF ADMISSION:  02/26/2014 DATE OF DISCHARGE:  03/01/2014   PRIMARY CARE PHYSICIAN: Miguel Aschoff, MD  DISCHARGE MEDICATIONS:   xan  6 hours as needed for anxiety, cyclobenzaprine 10 mg twice a day,  acetaminophen 325 mg 2 tablets every 4 hours as needed for temperature or pain, calcium and vitamin D 500 mg 200 international units 1 tablet twice a day with meals, Bisacodyl 10 mg rectal suppository once a day at bedtime as needed for constipation, ferrous sulfate 325 mg twice a day, Colace 100 mg twice a day, MiraLax 17 grams orally once a day, magnesium hydroxide 8% 30 mL once a day at bedtime as needed for constipation. Sodium chloride nasal spray 1 spray every 2 hours as needed for congestion, Lovenox injection 30 mg subcutaneous injection every 12 hours length of treatment up to orthopedic surgery, Norco 5/325 1 tablet every 6 hours as needed for severe pain, Levaquin 500 mg once a day for 6 days, bethanechol 25 mg 1 tablet 3 times a day for 3 days, nystatin swish and swallow 5 mL 4 times a day for 7 days.   DIET: Regular, regular consistency.   ACTIVITY: As tolerated.   FOLLOW-UP:  Physical therapy at rehab, follow-up 1 to 2 days with doctor at rehab and 1 to 2 weeks with Dr. Mack Guise.   HOSPITAL COURSE: The patient was admitted 02/26/2014, and discharged 03/01/2014.  She came in after a fall and was found to have a right hip fracture. The patient went to the Operating Room the same day, 02/26/2014, had a right hip hemiarthroplasty.  Please see operative report by Dr. Mack Guise.   LABORATORY AND RADIOLOGICAL DATA DURING THE HOSPITAL COURSE: Included pathology showed changes consistent with fracture.  Chest x-ray, ill-defined opacity of the left costophrenic angle, may reflect a prominent epicardial fat pad, atelectasis pneumonia. Follow-up needed.   Right hip showed displaced right femoral neck fracture of  proximal migration of the femoral shaft.  Vitamin D level 49.  INR 0.9, glucose 103, BUN 30, creatinine 1.07, sodium 139, potassium 3.7, chloride 103, CO2 of 27, calcium 9.9, alkaline phosphatase 140, ALT 23, AST 16.  White blood cell count 7.4, hemoglobin and hematocrit 12.6 and 37.3, platelet count of 208,000.  EKG showed sinus tachycardia, left atrial infection. Urine culture negative.  Pelvis x-ray after surgery showed satisfactory appearance of the right hip. TSH 0.559, hemoglobin A1c 6.0, creatinine upon discharge 0.74.  Hemoglobin upon discharge 10.0. White count upon discharge 6.6.   HOSPITAL COURSE PER PROBLEM LIST:  1.  For the patient's right hip fracture, the patient was brought to the Operating Room on 02/26/2014 and will go out to rehabilitation on 03/01/2014.  Activity as tolerated as per orthopedic discharge instructions with physical therapy at rehabilitation.  Follow-up with orthopedics as outpatient.  Length of treatment on the enoxaparin to orthopedic surgery.  2.  Left lower lobe pneumonia.  Since the patient did have surgery, they got aggressive with treating with antibiotics.  I am not sure if this is pneumonia, but I would recommend a follow-up chest x-ray in 6 weeks to ensure clearing.  3.  Chronic nausea, seems better today.  4.  Acute blood loss anemia. The patient is on ferrous sulfate. Hemoglobin satisfactory.  5.  Thrush. Started on nystatin swish and swallow.  I gave 1 dose of Diflucan.  6.  Urinary retention, likely secondary to pain medications, figure I  decreased the pain medication from Dilaudid down to [place].  As per family, she has tolerated Norco in the past. If you need to go back to the Dilaudid, I would use a smaller dose of 1 mg. I am starting bethanechol 25 mg t.i.d. for 3 days, straight cath if needed every 6 hours or for lower abdominal pain.  I would not place a Foley catheter; best bet would be to try to get the patient off pain medications as quickly as  possible and use Tylenol as needed for pain.    TIME SPENT ON DISCHARGE: 40 minutes.     ____________________________ Tana Conch. Leslye Peer, MD rjw:DT D: 03/01/2014 08:15:46 ET T: 03/01/2014 08:56:49 ET JOB#: [id number]  cc: Tana Conch. Leslye Peer, MD, <Dictator> Marisue Brooklyn MD ELECTRONICALLY SIGNED 03/06/2014 13:52

## 2014-05-25 NOTE — Op Note (Signed)
PATIENT NAME:  Terri Perez, Terri Perez MR#:  [id number] DATE OF BIRTH:  [birth year]  DATE OF PROCEDURE:  02/26/2014   PREOPERATIVE DIAGNOSIS: Right displaced femoral neck hip fracture.   POSTOPERATIVE DIAGNOSIS: Right displaced femoral neck hip fracture.   PROCEDURE: Right hip hemiarthroplasty.   ANESTHESIA: Spinal.   SURGEON: Thornton Park, MD    ESTIMATED BLOOD LOSS: 700 mL.   COMPLICATIONS: None.   IMPLANTS:  Stryker Accolade TMZF size 5.5 stem 15 mm, UHR universal bipolar component head with 50 mm outer diameter and a LFIT V40 femoral head, 26 mm diameter, +8 mm offset femoral head.   INDICATIONS FOR PROCEDURE: The patient is a 69 year old female who sustained a fracture to her right hip.  She denies a fall but had significant groin pain and was unable to bear weight. She was diagnosed with a displaced femoral neck hip fracture by x-ray upon arrival to the [facility] Emergency Department.  The patient was admitted to the hospitalist service.  She was cleared for surgery. Based on her ambulatory status at baseline I recommended that she proceed with a right hip hemiarthroplasty to treat this fracture.  I reviewed the risks and benefits of the surgery with the patient and her husband.  I explained to him the risks of surgery include infection, requiring the removal of the prosthesis, bleeding requiring blood transfusion, nerve or blood vessel injury, leg length discrepancy, change in lower extremity rotation, dislocation, fracture, persistent hip pain and the need for further surgery including conversion to a total hip arthroplasty. Medical risks included, but are not limited to, deep vein thrombosis and pulmonary embolism, myocardial infarction, stroke, pneumonia, respiratory failure and death. They understood these risks and wished to proceed.   PROCEDURE NOTE: The patient was marked over the right hip with the word "yes" and my initials, according to the hospital's correct site of surgery  protocol.  She was brought to the Operating Room where she underwent a spinal anesthetic. She was then positioned in a right lateral decubitus position. All bony prominences were adequately padded.  A pegboard was used for positioning during this case. The patient had an axillary roll under her right side as well as additional padding around the right common peroneal nerve to prevent compression injury during the case. She was prepped and draped in sterile fashion. A timeout was performed to verify the patient's name, date of birth, medical record number, correct site of surgery and correct procedure to be performed. It was also used to verify the patient had received antibiotics and that all appropriate instruments and implants  and radiographic studies were available in the room. Once all in attendance, were in agreement, the case began.   A curvilinear incision was made centered over the greater trochanter. The subcutaneous tissue was dissected using electrocautery, and all bleeding vessels were cauterized during the exposure. The fascia lata was then identified and sharply incised with a deep #10 blade. The Charnley hip retractor was placed to allow for visualization of the hip joint.  The trochanteric bursa was resected. The external rotators of the right hip were then identified and dissected from their posterior attachment to the greater tuberosity. They were tagged for later repair and reflected posteriorly to protect the sciatic nerve. The hip capsule was then identified. A T-shaped capsulotomy was performed. A #2 Tycron was used to tag each leaflet of the capsulotomy for later repair. The fracture was then easily identified. The femoral head was removed using a corkscrew device. This was measured  to be 50 mm in diameter.   A proximal femoral osteotomy was then made approximately 1 fingerbreadth above the lesser trochanter. This was made using an oscillating saw. All bony debris was removed using a  rongeur. The attention was then turned back to the acetabulum. Two cobra retractors were placed around the rim of the acetabulum which allowed for placement of the 50 mm femoral head trial into the acetabulum. This had excellent suction fit.  The attention was then turned back to femoral preparation.   A femoral neck skid was placed underneath the femoral neck for adequate exposure.   A Kober retractor was also used so that the calcar could be visualized.  A box osteotome was used to make the initial entry into the proximal femur. A single hand reamer was then sent down the femoral canal. A femoral canal sounder was then placed down the femoral shaft to insure that no cortical penetration had occurred during reaming of the femoral canal.  Sequential broaches were then used.  The best medial and lateral fit was achieved with a size 5.5 femoral broach. A 127 degree neck trial with a 15 mm femoral head components were then assembled.  A +0, +4 and +8 femoral heads were trialed.  The patient had the best stability and range of motion and equivalent leg lengths with a +8 adjustment.   All trial instruments were removed. The hip joint was copiously irrigated. The actual Stryker Accolade TMZF size 5.5 stem was then gently malleted into position. The 50 mm outer diameter head with a +8 inner head was trialed, and again found to have excellent range of motion, stability and equivalent leg lengths.  The actual bipolar head was then assembled. The Stryker LFIT V40 femoral head 26 mm head was with a +8 offset was gently malleted onto the trunnion. Then the Stryker UHR Universal bipolar head component with a 50 mm outer diameter was then gently malleted onto the femoral component as well.  This construct was then reduced in the acetabulum.  The hip had excellent range of motion and stability and again, it had equivalent leg lengths. The capsulotomy was then repaired using #2 Tycron in an interrupted fashion.  The piriformis  tendon was also repaired using a soft tissue repair. The wound was copiously irrigated again using pulse lavage.  The fascia lata was then repaired using interrupted 0 Vicryl, the subcutaneous tissue closed in two layers with zero and 2-0 Vicryl, and the skin approximated with staples. A dry sterile dressing was applied. The patient was then placed supine on the operative table while an abduction pillow was placed between her legs. Clinically she had equivalent leg lengths and had palpable pedal pulses. I was scrubbed and present for the entire case. All sharp and instrument counts were correct at the conclusion of the case. The patient was transferred to a hospital bed and brought to the PACU in stable condition. I spoke with her husband and her daughter in the postoperative consultation room to let her know the case had gone without complication and the patient was stable in the recovery room.      ____________________________ Timoteo Gaul, MD klk:DT D: 02/26/2014 14:52:31 ET T: 02/26/2014 16:58:02 ET JOB#: [id number]  cc: Timoteo Gaul, MD, <Dictator> Timoteo Gaul MD ELECTRONICALLY SIGNED 03/03/2014 18:24

## 2014-05-25 NOTE — H&P (Signed)
PATIENT NAME:  Terri Perez, Terri Perez MR#:  [id number] DATE OF BIRTH:  Nov 05, 1945  DATE OF ADMISSION:  02/26/2014  REFERRING PHYSICIAN:  Valli Glance. Owens Shark, MD  PRIMARY CARE PHYSICIAN:  Richard L. Rosanna Randy, MD   ADMISSION DIAGNOSIS:  Right hip fracture.   HISTORY OF PRESENT ILLNESS:  This is a 69 year old Caucasian female who presents to the Emergency Department complaining of right hip pain. X-ray of the hip in the Emergency Department showed a hip fracture. The patient denies falling, but states that her right leg "has always been sluggish." The patient has a history of recent tailbone fracture approximately 4 months ago. She had been using a walker to get around, but had not done so tonight when she experienced pain in her right hip. She states that she felt a pop in the right hip, but was unable to bear weight the previous 3 days. She currently has pain radiating to her groin and is purposely flexing and externally rotating the leg. Following consultation with orthopedic surgery, the Emergency Department called for admission to the internal medicine service for preoperative care and clearance.   REVIEW OF SYSTEMS: CONSTITUTIONAL:  The patient denies fever or weakness.  EYES:  Denies blurred vision or inflammation.  EARS, NOSE, AND THROAT:  Denies tinnitus or sore throat.  RESPIRATORY:  Denies cough or shortness of breath.  CARDIOVASCULAR:  Denies chest pain, palpitations, orthopnea, or paroxysmal nocturnal dyspnea.  GASTROINTESTINAL:  Denies nausea, vomiting, diarrhea, or abdominal pain.  GENITOURINARY:  Denies dysuria, increased frequency, or hesitancy. ENDOCRINE:  Denies polyuria or polydipsia.  HEMATOLOGIC AND LYMPHATIC:  Denies easy bruising or bleeding.  INTEGUMENT:  Denies rashes or lesions.  MUSCULOSKELETAL:  The patient admits to multiple aches and pains, but denies myalgias.  NEUROLOGIC:  Denies numbness in her extremities or dysarthria.  PSYCHIATRIC:  Denies depression or suicidal  ideation.   PAST MEDICAL HISTORY:   1.  Osteoarthritis status post cortisone injections to both hips 3 weeks ago.  2.  History of breast cancer. The patient took tamoxifen for 4 years, but stopped 7 months ago due to joint pain.  3.  Depression.  4.  Fibromyalgia.   PAST SURGICAL HISTORY:  The patient has had a left wrist neuroma removal, C5 bone spur removal, partial hysterectomy, excisional biopsy of basal carcinoma from her face, dental surgeries, and partial colectomy and reanastomosis due to bowel obstruction.   SOCIAL HISTORY:  The patient lives with her husband. She does not smoke, drink, or do any drugs.   FAMILY HISTORY:  The patient's mother is deceased of complications from a cerebrovascular accident, and she has a living sister who has diabetes.   MEDICATIONS: 1.  Alprazolam 0.5 mg 1 tablet p.o. every 6 hours as needed for anxiety.  2.  Cyclobenzaprine 10 mg 1 tablet p.o. b.i.d. as needed for muscle spasms.   ALLERGIES:  ANTI-INFLAMMATORY MEDICATIONS, PERCOCET 7.5/325, AND VICODIN.   PERTINENT LABORATORY RESULTS AND RADIOGRAPHIC FINDINGS:  Serum glucose is 103, BUN 30, creatinine 1.07, serum sodium 139, potassium 3.7, chloride 103, bicarbonate 27, calcium 9.9, serum albumin 3.2, alkaline phosphatase 140, AST 16, and ALT is 23. White blood cell count is 7.4, hemoglobin 12.6, hematocrit 37.3, platelet count 208,000, and MCV is 93. INR is 0.9. Urinalysis is negative for infection.   X-ray of the right hip shows displaced right femoral neck fracture with proximal migration of the femoral shaft as well as remote right pubic rami fractures with surrounding callus. Chest x-ray shows an ill-defined opacity at  the left costophrenic angle; this may reflect a prominent epicardial fat pad, atelectasis, pneumonia, or less likely pleural effusion.   PHYSICAL EXAMINATION: VITAL SIGNS:  Temperature is 98.7, pulse 107, respirations 18, blood pressure 136/69, and pulse oximetry is 99% on 2 liters  of oxygen via nasal cannula.  GENERAL:  The patient is alert and oriented, although she is somewhat somnolent and slurring her speech following pain medication. She is in no apparent distress at this time.  HEENT:  Normocephalic, atraumatic. Pupils are equal, round, and reactive to light and accommodation, although somewhat pinpoint following morphine injection. Extraocular movements are intact. Mucous membranes are slightly dry.  NECK:  Trachea is midline. No adenopathy. Thyroid is nonpalpable and nontender.  CHEST:  Symmetric and atraumatic.  CARDIOVASCULAR:  Regular rate and rhythm. Normal S1 and S2. No rubs, clicks, or murmurs appreciated.  LUNGS:  Clear to auscultation bilaterally. Normal effort and excursion.  ABDOMEN:  Positive bowel sounds. Soft, nontender, nondistended. There is no hepatosplenomegaly.  GENITOURINARY:  Foley catheter is in place.  MUSCULOSKELETAL:  The patient moves her left leg somewhat but chooses not to do so due to pain. She has full range of motion of her upper extremities bilaterally. Her right leg is flexed and externally rotated.  SKIN:  There are no rashes or lesions.  NEUROLOGIC:  Cranial nerves II through XII are grossly intact.  PSYCHIATRIC:  Mood is normal. Affect is congruent. The patient has excellent insight and judgment into her medical condition.   ASSESSMENT AND PLAN:  This is a 69 year old female admitted for right hip fracture.   1.  Hip fracture. Our goal currently is to manage the patient's pain. Orthopedic surgery will see the patient in the morning and hopefully proceed with surgery. She is a low-to-moderate risk for non-cardiothoracic surgery.  2.  Osteoarthritis. I will check the patient's vitamin D level. She reports that she has had a bone scan within the last 4 months, which was normal but has received cortisone injections in both hips as recently as 3 weeks ago. I will check the patient's vitamin D level.  3.  Overweight. The patient's body  mass index is 27.3. I have encouraged a healthy diet post surgical repair.  4.  Deep vein thrombosis prophylaxis with heparin.  5.  Gastrointestinal prophylaxis is unnecessary, as the patient is not critically ill.   CODE STATUS:  The patient is a full code.   TIME SPENT ON ADMISSION ORDERS AND PATIENT CARE:  Approximately 35 minutes.   ____________________________ Norva Riffle. Marcille Blanco, MD msd:nb D: 02/26/2014 04:34:27 ET T: 02/26/2014 04:46:14 ET JOB#: [id number]  cc: Norva Riffle. Marcille Blanco, MD, <Dictator> Norva Riffle Jehieli Brassell MD ELECTRONICALLY SIGNED 02/26/2014 7:18

## 2014-05-27 DIAGNOSIS — S7291XD Unspecified fracture of right femur, subsequent encounter for closed fracture with routine healing: Secondary | ICD-10-CM | POA: Diagnosis not present

## 2014-05-27 DIAGNOSIS — Z96641 Presence of right artificial hip joint: Secondary | ICD-10-CM | POA: Diagnosis not present

## 2014-05-27 DIAGNOSIS — W19XXXD Unspecified fall, subsequent encounter: Secondary | ICD-10-CM | POA: Diagnosis not present

## 2014-05-27 DIAGNOSIS — F419 Anxiety disorder, unspecified: Secondary | ICD-10-CM | POA: Diagnosis not present

## 2014-05-27 DIAGNOSIS — M1990 Unspecified osteoarthritis, unspecified site: Secondary | ICD-10-CM | POA: Diagnosis not present

## 2014-05-27 DIAGNOSIS — M797 Fibromyalgia: Secondary | ICD-10-CM | POA: Diagnosis not present

## 2014-05-29 DIAGNOSIS — M1990 Unspecified osteoarthritis, unspecified site: Secondary | ICD-10-CM | POA: Diagnosis not present

## 2014-05-29 DIAGNOSIS — F419 Anxiety disorder, unspecified: Secondary | ICD-10-CM | POA: Diagnosis not present

## 2014-05-29 DIAGNOSIS — Z96641 Presence of right artificial hip joint: Secondary | ICD-10-CM | POA: Diagnosis not present

## 2014-05-29 DIAGNOSIS — M797 Fibromyalgia: Secondary | ICD-10-CM | POA: Diagnosis not present

## 2014-05-29 DIAGNOSIS — S7291XD Unspecified fracture of right femur, subsequent encounter for closed fracture with routine healing: Secondary | ICD-10-CM | POA: Diagnosis not present

## 2014-05-29 DIAGNOSIS — W19XXXD Unspecified fall, subsequent encounter: Secondary | ICD-10-CM | POA: Diagnosis not present

## 2014-06-03 DIAGNOSIS — W19XXXD Unspecified fall, subsequent encounter: Secondary | ICD-10-CM | POA: Diagnosis not present

## 2014-06-03 DIAGNOSIS — M1990 Unspecified osteoarthritis, unspecified site: Secondary | ICD-10-CM | POA: Diagnosis not present

## 2014-06-03 DIAGNOSIS — M797 Fibromyalgia: Secondary | ICD-10-CM | POA: Diagnosis not present

## 2014-06-03 DIAGNOSIS — Z96641 Presence of right artificial hip joint: Secondary | ICD-10-CM | POA: Diagnosis not present

## 2014-06-03 DIAGNOSIS — S7291XD Unspecified fracture of right femur, subsequent encounter for closed fracture with routine healing: Secondary | ICD-10-CM | POA: Diagnosis not present

## 2014-06-03 DIAGNOSIS — F419 Anxiety disorder, unspecified: Secondary | ICD-10-CM | POA: Diagnosis not present

## 2014-06-04 ENCOUNTER — Telehealth: Payer: Self-pay

## 2014-06-04 NOTE — Telephone Encounter (Signed)
Terri Perez OT with [facility] left v/m; pts husband refused OT services from home health for pt. Terri Perez needs cb that Dr Terri Perez got this message.

## 2014-06-05 DIAGNOSIS — M1990 Unspecified osteoarthritis, unspecified site: Secondary | ICD-10-CM | POA: Diagnosis not present

## 2014-06-05 DIAGNOSIS — Z96641 Presence of right artificial hip joint: Secondary | ICD-10-CM | POA: Diagnosis not present

## 2014-06-05 DIAGNOSIS — S7291XD Unspecified fracture of right femur, subsequent encounter for closed fracture with routine healing: Secondary | ICD-10-CM | POA: Diagnosis not present

## 2014-06-05 DIAGNOSIS — M797 Fibromyalgia: Secondary | ICD-10-CM | POA: Diagnosis not present

## 2014-06-05 NOTE — Telephone Encounter (Signed)
Okay--just cancel.  She is not continuing under my care since leaving rehab--FYI

## 2014-06-05 NOTE — Telephone Encounter (Signed)
Spoke with [facility] and advised results

## 2014-06-06 DIAGNOSIS — M797 Fibromyalgia: Secondary | ICD-10-CM | POA: Diagnosis not present

## 2014-06-06 DIAGNOSIS — M1990 Unspecified osteoarthritis, unspecified site: Secondary | ICD-10-CM | POA: Diagnosis not present

## 2014-06-06 DIAGNOSIS — Z96641 Presence of right artificial hip joint: Secondary | ICD-10-CM | POA: Diagnosis not present

## 2014-06-06 DIAGNOSIS — W19XXXD Unspecified fall, subsequent encounter: Secondary | ICD-10-CM | POA: Diagnosis not present

## 2014-06-06 DIAGNOSIS — S7291XD Unspecified fracture of right femur, subsequent encounter for closed fracture with routine healing: Secondary | ICD-10-CM | POA: Diagnosis not present

## 2014-06-06 DIAGNOSIS — F419 Anxiety disorder, unspecified: Secondary | ICD-10-CM | POA: Diagnosis not present

## 2014-06-10 DIAGNOSIS — Z96641 Presence of right artificial hip joint: Secondary | ICD-10-CM | POA: Diagnosis not present

## 2014-06-10 DIAGNOSIS — W19XXXD Unspecified fall, subsequent encounter: Secondary | ICD-10-CM | POA: Diagnosis not present

## 2014-06-10 DIAGNOSIS — S7291XD Unspecified fracture of right femur, subsequent encounter for closed fracture with routine healing: Secondary | ICD-10-CM | POA: Diagnosis not present

## 2014-06-10 DIAGNOSIS — M797 Fibromyalgia: Secondary | ICD-10-CM | POA: Diagnosis not present

## 2014-06-10 DIAGNOSIS — M1990 Unspecified osteoarthritis, unspecified site: Secondary | ICD-10-CM | POA: Diagnosis not present

## 2014-06-10 DIAGNOSIS — F419 Anxiety disorder, unspecified: Secondary | ICD-10-CM | POA: Diagnosis not present

## 2014-06-12 DIAGNOSIS — Z96641 Presence of right artificial hip joint: Secondary | ICD-10-CM | POA: Diagnosis not present

## 2014-06-12 DIAGNOSIS — M797 Fibromyalgia: Secondary | ICD-10-CM | POA: Diagnosis not present

## 2014-06-12 DIAGNOSIS — M1990 Unspecified osteoarthritis, unspecified site: Secondary | ICD-10-CM | POA: Diagnosis not present

## 2014-06-12 DIAGNOSIS — F419 Anxiety disorder, unspecified: Secondary | ICD-10-CM | POA: Diagnosis not present

## 2014-06-12 DIAGNOSIS — S7291XD Unspecified fracture of right femur, subsequent encounter for closed fracture with routine healing: Secondary | ICD-10-CM | POA: Diagnosis not present

## 2014-06-12 DIAGNOSIS — W19XXXD Unspecified fall, subsequent encounter: Secondary | ICD-10-CM | POA: Diagnosis not present

## 2014-06-16 DIAGNOSIS — M791 Myalgia: Secondary | ICD-10-CM | POA: Diagnosis not present

## 2014-06-16 DIAGNOSIS — Z23 Encounter for immunization: Secondary | ICD-10-CM | POA: Diagnosis not present

## 2014-06-16 DIAGNOSIS — F419 Anxiety disorder, unspecified: Secondary | ICD-10-CM | POA: Diagnosis not present

## 2014-06-16 DIAGNOSIS — F329 Major depressive disorder, single episode, unspecified: Secondary | ICD-10-CM | POA: Diagnosis not present

## 2014-06-16 DIAGNOSIS — C50919 Malignant neoplasm of unspecified site of unspecified female breast: Secondary | ICD-10-CM | POA: Diagnosis not present

## 2014-06-18 DIAGNOSIS — S7291XD Unspecified fracture of right femur, subsequent encounter for closed fracture with routine healing: Secondary | ICD-10-CM | POA: Diagnosis not present

## 2014-06-18 DIAGNOSIS — F419 Anxiety disorder, unspecified: Secondary | ICD-10-CM | POA: Diagnosis not present

## 2014-06-18 DIAGNOSIS — W19XXXD Unspecified fall, subsequent encounter: Secondary | ICD-10-CM | POA: Diagnosis not present

## 2014-06-18 DIAGNOSIS — M1990 Unspecified osteoarthritis, unspecified site: Secondary | ICD-10-CM | POA: Diagnosis not present

## 2014-06-18 DIAGNOSIS — Z96641 Presence of right artificial hip joint: Secondary | ICD-10-CM | POA: Diagnosis not present

## 2014-06-18 DIAGNOSIS — M797 Fibromyalgia: Secondary | ICD-10-CM | POA: Diagnosis not present

## 2014-06-19 DIAGNOSIS — F419 Anxiety disorder, unspecified: Secondary | ICD-10-CM | POA: Diagnosis not present

## 2014-06-19 DIAGNOSIS — S7291XD Unspecified fracture of right femur, subsequent encounter for closed fracture with routine healing: Secondary | ICD-10-CM | POA: Diagnosis not present

## 2014-06-19 DIAGNOSIS — M1990 Unspecified osteoarthritis, unspecified site: Secondary | ICD-10-CM | POA: Diagnosis not present

## 2014-06-19 DIAGNOSIS — Z96641 Presence of right artificial hip joint: Secondary | ICD-10-CM | POA: Diagnosis not present

## 2014-06-19 DIAGNOSIS — M797 Fibromyalgia: Secondary | ICD-10-CM | POA: Diagnosis not present

## 2014-06-19 DIAGNOSIS — W19XXXD Unspecified fall, subsequent encounter: Secondary | ICD-10-CM | POA: Diagnosis not present

## 2014-06-24 DIAGNOSIS — Z888 Allergy status to other drugs, medicaments and biological substances status: Secondary | ICD-10-CM | POA: Diagnosis not present

## 2014-06-24 DIAGNOSIS — Z471 Aftercare following joint replacement surgery: Secondary | ICD-10-CM | POA: Diagnosis not present

## 2014-06-24 DIAGNOSIS — Z96641 Presence of right artificial hip joint: Secondary | ICD-10-CM | POA: Diagnosis not present

## 2014-06-26 DIAGNOSIS — S7291XD Unspecified fracture of right femur, subsequent encounter for closed fracture with routine healing: Secondary | ICD-10-CM | POA: Diagnosis not present

## 2014-06-26 DIAGNOSIS — M797 Fibromyalgia: Secondary | ICD-10-CM | POA: Diagnosis not present

## 2014-06-26 DIAGNOSIS — M1990 Unspecified osteoarthritis, unspecified site: Secondary | ICD-10-CM | POA: Diagnosis not present

## 2014-06-26 DIAGNOSIS — W19XXXD Unspecified fall, subsequent encounter: Secondary | ICD-10-CM | POA: Diagnosis not present

## 2014-06-26 DIAGNOSIS — F419 Anxiety disorder, unspecified: Secondary | ICD-10-CM | POA: Diagnosis not present

## 2014-06-26 DIAGNOSIS — Z96641 Presence of right artificial hip joint: Secondary | ICD-10-CM | POA: Diagnosis not present

## 2014-06-30 DIAGNOSIS — F419 Anxiety disorder, unspecified: Secondary | ICD-10-CM | POA: Diagnosis not present

## 2014-06-30 DIAGNOSIS — Z96641 Presence of right artificial hip joint: Secondary | ICD-10-CM | POA: Diagnosis not present

## 2014-06-30 DIAGNOSIS — M1990 Unspecified osteoarthritis, unspecified site: Secondary | ICD-10-CM | POA: Diagnosis not present

## 2014-06-30 DIAGNOSIS — M797 Fibromyalgia: Secondary | ICD-10-CM | POA: Diagnosis not present

## 2014-06-30 DIAGNOSIS — W19XXXD Unspecified fall, subsequent encounter: Secondary | ICD-10-CM | POA: Diagnosis not present

## 2014-06-30 DIAGNOSIS — S7291XD Unspecified fracture of right femur, subsequent encounter for closed fracture with routine healing: Secondary | ICD-10-CM | POA: Diagnosis not present

## 2014-07-03 DIAGNOSIS — M1990 Unspecified osteoarthritis, unspecified site: Secondary | ICD-10-CM | POA: Diagnosis not present

## 2014-07-03 DIAGNOSIS — F419 Anxiety disorder, unspecified: Secondary | ICD-10-CM | POA: Diagnosis not present

## 2014-07-03 DIAGNOSIS — M797 Fibromyalgia: Secondary | ICD-10-CM | POA: Diagnosis not present

## 2014-07-03 DIAGNOSIS — W19XXXD Unspecified fall, subsequent encounter: Secondary | ICD-10-CM | POA: Diagnosis not present

## 2014-07-03 DIAGNOSIS — S7291XD Unspecified fracture of right femur, subsequent encounter for closed fracture with routine healing: Secondary | ICD-10-CM | POA: Diagnosis not present

## 2014-07-03 DIAGNOSIS — Z96641 Presence of right artificial hip joint: Secondary | ICD-10-CM | POA: Diagnosis not present

## 2014-07-07 DIAGNOSIS — Z96641 Presence of right artificial hip joint: Secondary | ICD-10-CM | POA: Diagnosis not present

## 2014-07-07 DIAGNOSIS — W19XXXD Unspecified fall, subsequent encounter: Secondary | ICD-10-CM | POA: Diagnosis not present

## 2014-07-07 DIAGNOSIS — M1990 Unspecified osteoarthritis, unspecified site: Secondary | ICD-10-CM | POA: Diagnosis not present

## 2014-07-07 DIAGNOSIS — M797 Fibromyalgia: Secondary | ICD-10-CM | POA: Diagnosis not present

## 2014-07-07 DIAGNOSIS — S7291XD Unspecified fracture of right femur, subsequent encounter for closed fracture with routine healing: Secondary | ICD-10-CM | POA: Diagnosis not present

## 2014-07-07 DIAGNOSIS — F419 Anxiety disorder, unspecified: Secondary | ICD-10-CM | POA: Diagnosis not present

## 2014-07-09 ENCOUNTER — Other Ambulatory Visit: Payer: Self-pay | Admitting: General Surgery

## 2014-07-09 ENCOUNTER — Ambulatory Visit: Payer: Medicare Other

## 2014-07-09 ENCOUNTER — Ambulatory Visit
Admission: RE | Admit: 2014-07-09 | Discharge: 2014-07-09 | Disposition: A | Discharge status: Home / Self Care | Payer: Medicare Other | Source: Ambulatory Visit | Attending: General Surgery | Admitting: General Surgery

## 2014-07-09 DIAGNOSIS — R922 Inconclusive mammogram: Secondary | ICD-10-CM | POA: Diagnosis not present

## 2014-07-09 DIAGNOSIS — Z1231 Encounter for screening mammogram for malignant neoplasm of breast: Secondary | ICD-10-CM

## 2014-07-09 HISTORY — DX: Malignant neoplasm of unspecified site of unspecified female breast: C50.919

## 2014-07-11 DIAGNOSIS — M797 Fibromyalgia: Secondary | ICD-10-CM | POA: Diagnosis not present

## 2014-07-11 DIAGNOSIS — W19XXXD Unspecified fall, subsequent encounter: Secondary | ICD-10-CM | POA: Diagnosis not present

## 2014-07-11 DIAGNOSIS — M1990 Unspecified osteoarthritis, unspecified site: Secondary | ICD-10-CM | POA: Diagnosis not present

## 2014-07-11 DIAGNOSIS — F419 Anxiety disorder, unspecified: Secondary | ICD-10-CM | POA: Diagnosis not present

## 2014-07-11 DIAGNOSIS — S7291XD Unspecified fracture of right femur, subsequent encounter for closed fracture with routine healing: Secondary | ICD-10-CM | POA: Diagnosis not present

## 2014-07-11 DIAGNOSIS — Z96641 Presence of right artificial hip joint: Secondary | ICD-10-CM | POA: Diagnosis not present

## 2014-07-15 DIAGNOSIS — F419 Anxiety disorder, unspecified: Secondary | ICD-10-CM | POA: Diagnosis not present

## 2014-07-15 DIAGNOSIS — Z96641 Presence of right artificial hip joint: Secondary | ICD-10-CM | POA: Diagnosis not present

## 2014-07-15 DIAGNOSIS — W19XXXD Unspecified fall, subsequent encounter: Secondary | ICD-10-CM | POA: Diagnosis not present

## 2014-07-15 DIAGNOSIS — M1990 Unspecified osteoarthritis, unspecified site: Secondary | ICD-10-CM | POA: Diagnosis not present

## 2014-07-15 DIAGNOSIS — M797 Fibromyalgia: Secondary | ICD-10-CM | POA: Diagnosis not present

## 2014-07-15 DIAGNOSIS — S7291XD Unspecified fracture of right femur, subsequent encounter for closed fracture with routine healing: Secondary | ICD-10-CM | POA: Diagnosis not present

## 2014-07-16 ENCOUNTER — Ambulatory Visit (INDEPENDENT_AMBULATORY_CARE_PROVIDER_SITE_OTHER): Payer: Medicare Other | Admitting: General Surgery

## 2014-07-16 ENCOUNTER — Encounter: Payer: Self-pay | Admitting: General Surgery

## 2014-07-16 VITALS — BP 128/78 | HR 102 | Resp 16 | Ht 69.0 in | Wt 199.0 lb

## 2014-07-16 DIAGNOSIS — C50912 Malignant neoplasm of unspecified site of left female breast: Secondary | ICD-10-CM

## 2014-07-16 DIAGNOSIS — L080 Pyoderma: Secondary | ICD-10-CM | POA: Diagnosis not present

## 2014-07-16 DIAGNOSIS — L989 Disorder of the skin and subcutaneous tissue, unspecified: Secondary | ICD-10-CM | POA: Diagnosis not present

## 2014-07-16 DIAGNOSIS — D485 Neoplasm of uncertain behavior of skin: Secondary | ICD-10-CM | POA: Diagnosis not present

## 2014-07-16 NOTE — Patient Instructions (Signed)
The patient is aware to call back for any questions or concerns. Keep area clean Will call with pathology results.

## 2014-07-16 NOTE — Progress Notes (Signed)
[name], female   DOB: [birth year], 69 y.o.   MRN: [id number]  Chief Complaint  Patient presents with  . Follow-up    mammogram    HPI Terri Perez is a 69 y.o. female who presents for a breast evaluation. The most recent mammogram was done on 04/08/14.Patient does perform regular self breast checks and gets regular mammograms done.  She had a hip replacement surgery done in March and has been in a rehab facility due to this. No new breast issues.  Dr Rosanna Randy is arranging a bone density.   HPI  Past Medical History  Diagnosis Date  . Personal history of fibromyalgia 2002  . Unspecified constipation   . H/O cystitis 2011  . Special screening for malignant neoplasms, colon   . Arm fracture     right forearm, d/t fall  . Personal history of malignant neoplasm of large intestine   . Bowel trouble 2010  . Cancer 12/2009    Left UOQ breast tumor; wide excision,sn bx and whole breast radiation. Chemotherapy done. There was only a 14m area of residual tumor remaining. All sn were negative. This was ER-positive, PR-negative, HER-2 neu not over expressed tumor. The original ultrasound size was 2.6 cm(T2).  . 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 On wide excision 3 mm area of residual tumor. Sentinel node negative. ER-30%, PR-negative, HER-2 not overexpressing.  . Breast cancer 12/2009    left, chemo and radiation    Past Surgical History  Procedure Laterality Date  . Colonoscopy  2010    Dr. WAllen Norris normal  . Portacath placement  2011  . Port-a-cath removal  2013  . Breast surgery Left 2011    wide excision  . Breast biopsy Left 2011  . Breast cyst aspiration Left 1985  . Skin cancer excision  1980    face  . Tubal ligation  1973  . Abdominal hysterectomy  1974  . Back surgery  1985    bone spurs between 5&6, used bone from left hip  . [facility]  . Femur fracture surgery Right March 2016  . Joint replacement Bilateral Jan 2016 and March 2016    hip    Family History  Problem Relation Age of Onset  . Cancer Brother     colon  . Cancer Other     breast and ovarian cancers, relationships not listed  . Cancer Other 524   niece    Social History History  Substance Use Topics  . Smoking status: Never Smoker   . Smokeless tobacco: Not on file  . Alcohol Use: No    Allergies  Allergen Reactions  . Milk-Related Compounds   . Oxycodone-Acetaminophen   . Vicodin [Hydrocodone-Acetaminophen] Nausea Only  . Wheat Bran   . Tape Rash    Rash from steri strips    Current Outpatient Prescriptions  Medication Sig Dispense Refill  . ALPRAZolam (XANAX) 0.25 MG tablet Take 0.25 mg by mouth at bedtime as needed for anxiety.    . cyclobenzaprine (FLEXERIL) 10 MG tablet Take 10 mg by mouth 3 (three) times daily.  0  . traMADol (ULTRAM) 50 MG tablet TAKE 1 TO 2 TABLETS BY MOUTH EVERY 4 HOURS FOR PAIN  0   No current facility-administered medications for this visit.    Review of Systems Review of Systems  Constitutional: Negative.  Respiratory: Negative.   Cardiovascular: Negative.     Blood pressure 128/78, pulse 102, resp. rate 16, height '5\' 9"'  (1.753 m), weight 199 lb (90.266 kg).  Physical Exam Physical Exam  Constitutional: She is oriented to person, place, and time. She appears well-developed and well-nourished.  HENT:  Mouth/Throat: Oropharynx is clear and moist.  Eyes: Conjunctivae are normal. No scleral icterus.  Neck: Neck supple.  Cardiovascular: Normal rate, regular rhythm and normal heart sounds.   Pulmonary/Chest: Effort normal and breath sounds normal. Right breast exhibits no inverted nipple, no mass, no nipple discharge, no skin change and no tenderness. Left breast exhibits no inverted nipple, no mass, no nipple discharge, no skin change and no tenderness.    dimpling at 3 o'clock left breast   Lymphadenopathy:    She has no cervical adenopathy.    She has no axillary adenopathy.  Neurological: She is alert and oriented to person, place, and time.  Skin: Skin is warm.  8 mm raised red nodule mid left chest  Several keratosis on back with the largest at 1 x 1.6 right back    Data Reviewed Medical oncology note dated 05/15/2014 was reviewed. Femara discontinued secondary to progressive bone pain. Failure to tolerate tamoxifen.  Assessment     No evidence of recurrent breast cancer. New left breast skin nodule.  Plan    Resection of the new chest wall nodule on the left breast skin was recommended and accepted. 10 mL of 0.5% Xylocaine with 0.25% Marcaine with 1-200,000 of epinephrine was utilized well tolerated. The area was excised in elliptical incision with a suture placed at the 12:00 position. The wound was closed with interrupted 4-0 Vicryls sutures. Benzoin and Steri-Strips followed by Telfa and Tegaderm dressing applied.       Awaiting pathology results. the Tegaderm dressing can be removed in 2-3 days. Been asked to call if any wound healing issues develop (the patient will be not be asked to come in for a visit to her impaired mobility post hip replacement.) She has been asked to return to the office in one year with a bilateral diagnostic mammogram.  PCP: Dr Miguel Aschoff    Terri Perez 07/17/2014, 7:40 PM

## 2014-07-17 DIAGNOSIS — L989 Disorder of the skin and subcutaneous tissue, unspecified: Secondary | ICD-10-CM | POA: Insufficient documentation

## 2014-07-18 DIAGNOSIS — M1990 Unspecified osteoarthritis, unspecified site: Secondary | ICD-10-CM | POA: Diagnosis not present

## 2014-07-18 DIAGNOSIS — W19XXXD Unspecified fall, subsequent encounter: Secondary | ICD-10-CM | POA: Diagnosis not present

## 2014-07-18 DIAGNOSIS — S7291XD Unspecified fracture of right femur, subsequent encounter for closed fracture with routine healing: Secondary | ICD-10-CM | POA: Diagnosis not present

## 2014-07-18 DIAGNOSIS — Z96641 Presence of right artificial hip joint: Secondary | ICD-10-CM | POA: Diagnosis not present

## 2014-07-18 DIAGNOSIS — F419 Anxiety disorder, unspecified: Secondary | ICD-10-CM | POA: Diagnosis not present

## 2014-07-18 DIAGNOSIS — M797 Fibromyalgia: Secondary | ICD-10-CM | POA: Diagnosis not present

## 2014-07-22 DIAGNOSIS — M797 Fibromyalgia: Secondary | ICD-10-CM | POA: Diagnosis not present

## 2014-07-22 DIAGNOSIS — Z96641 Presence of right artificial hip joint: Secondary | ICD-10-CM | POA: Diagnosis not present

## 2014-07-22 DIAGNOSIS — S7291XD Unspecified fracture of right femur, subsequent encounter for closed fracture with routine healing: Secondary | ICD-10-CM | POA: Diagnosis not present

## 2014-07-22 DIAGNOSIS — W19XXXD Unspecified fall, subsequent encounter: Secondary | ICD-10-CM | POA: Diagnosis not present

## 2014-07-22 DIAGNOSIS — F419 Anxiety disorder, unspecified: Secondary | ICD-10-CM | POA: Diagnosis not present

## 2014-07-22 DIAGNOSIS — M1990 Unspecified osteoarthritis, unspecified site: Secondary | ICD-10-CM | POA: Diagnosis not present

## 2014-07-24 ENCOUNTER — Telehealth: Payer: Self-pay | Admitting: *Deleted

## 2014-07-24 NOTE — Telephone Encounter (Signed)
Notified patient as instructed, patient pleased. Discussed follow-up appointments, patient agrees  

## 2014-07-24 NOTE — Telephone Encounter (Signed)
-----   Message from Robert Bellow, MD sent at 07/24/2014  8:09 AM EDT ----- Please notify the patient that the biopsy showed only inflammatory changes. No cancer. Follow-up as previously scheduled. ----- Message -----    From: Lab in [facility] Interface    Sent: 07/18/2014   5:34 PM      To: Robert Bellow, MD

## 2014-07-30 ENCOUNTER — Encounter: Payer: Self-pay | Admitting: Physical Therapy

## 2014-07-30 ENCOUNTER — Ambulatory Visit: Payer: Self-pay | Admitting: Internal Medicine

## 2014-07-30 ENCOUNTER — Ambulatory Visit: Payer: Medicare Other | Attending: Student | Admitting: Physical Therapy

## 2014-07-30 ENCOUNTER — Other Ambulatory Visit: Payer: Self-pay | Admitting: Emergency Medicine

## 2014-07-30 DIAGNOSIS — M25551 Pain in right hip: Secondary | ICD-10-CM | POA: Diagnosis not present

## 2014-07-30 DIAGNOSIS — R262 Difficulty in walking, not elsewhere classified: Secondary | ICD-10-CM | POA: Diagnosis not present

## 2014-07-30 DIAGNOSIS — M6281 Muscle weakness (generalized): Secondary | ICD-10-CM | POA: Diagnosis not present

## 2014-07-30 DIAGNOSIS — M797 Fibromyalgia: Secondary | ICD-10-CM

## 2014-07-30 MED ORDER — CYCLOBENZAPRINE HCL 10 MG PO TABS: 10.0000 mg | ORAL_TABLET | Freq: Three times a day (TID) | ORAL | Status: DC

## 2014-07-31 NOTE — Therapy (Signed)
[place] [facility] [address], [place], [address] Phone: [phone]   Fax:  [phone]  Physical Therapy Evaluation  Patient Details  Name: Terri Perez MRN: [id number] Date of Birth: 02-16-1945 Referring Provider:  Vela Prose, MD  Encounter Date: 07/30/2014      PT End of Session - 07/30/14 1200    Visit Number 1   Number of Visits 12   Date for PT Re-Evaluation 09/11/14   Authorization Type 1   Authorization Time Period 10   PT Start Time 1103   PT Stop Time 1155   PT Time Calculation (min) 52 min   Activity Tolerance Patient tolerated treatment well   Behavior During Therapy [facility] for tasks assessed/performed      Past Medical History  Diagnosis Date  . Personal history of fibromyalgia 2002  . Unspecified constipation   . H/O cystitis 2011  . Special screening for malignant neoplasms, colon   . Arm fracture     right forearm, d/t fall  . Personal history of malignant neoplasm of large intestine   . Bowel trouble 2010  . Cancer 12/2009    Left UOQ breast tumor; wide excision,sn bx and whole breast radiation. Chemotherapy done. There was only a 67m area of residual tumor remaining. All sn were negative. This was ER-positive, PR-negative, HER-2 neu not over expressed tumor. The original ultrasound size was 2.6 cm(T2).  . 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 On wide excision 3 mm area of residual tumor. Sentinel node negative. ER-30%, PR-negative, HER-2 not overexpressing.  . Breast cancer 12/2009    left, chemo and radiation    Past Surgical History  Procedure Laterality Date  . Colonoscopy  2010    Dr. WAllen Norris normal  . Portacath placement  2011  . Port-a-cath removal  2013  . Breast surgery Left 2011    wide excision  . Breast biopsy Left 2011  . Breast cyst aspiration Left 1985  . Skin cancer  excision  1980    face  . Tubal ligation  1973  . Abdominal hysterectomy  1974  . Back surgery  1985    bone spurs between 5&6, used bone from left hip  . [facility]  . Femur fracture surgery Right March 2016  . Joint replacement Bilateral Jan 2016 and March 2016    hip    There were no vitals filed for this visit.  Visit Diagnosis:  Muscle weakness - Plan: PT plan of care cert/re-cert  Difficulty walking - Plan: PT plan of care cert/re-cert  Right hip pain - Plan: PT plan of care cert/re-cert      Subjective Assessment - 07/30/14 1109    Subjective Patient reports she is having pain in right hip/groin region with sitting in hard chair and she exercises to help with this. She reports that walking also increases her hip pain as well. She is using a rolling walker (front wheels).    Pertinent History Patient reports she fell in the Fall 2015 and fractured sacrum/cocyxx and then was sitting in her chair and her hip popped which was a right hip fracture. She had a THR later 2015/early 2016 and while she was recuperating from this she fell and fractured her right femur and underwent right hip revision and ORIF with rod and plate in right femur 04/04/2014.  She was seen by home health physical therapy x 8 weeks and is now referred to out patient physical therapy for continued rehabiliation.    Limitations Sitting;Walking;House hold activities;Other (comment)  sleeping, prolonged walking   How long can you sit comfortably? > 1 hour   How long can you stand comfortably? 30 min.    How long can you walk comfortably? 10 min.    Patient Stated Goals She wants to get her "right leg in motion"   Currently in Pain? Yes   Pain Score 4    Pain Location Groin   Pain Orientation Right   Pain Descriptors / Indicators Aching;Dull   Pain Type Surgical pain;Other (Comment)  s/p femur fracture and revision of right THR   Pain Onset More than a month ago   Pain Frequency  Intermittent   Aggravating Factors  prolonged walking, getting in and out of bed, raising right LE   Pain Relieving Factors ice, exercise, moving   Effect of Pain on Daily Activities Patient is limited in daily activities involving walking   Multiple Pain Sites No    Objective: Gait: ambulating with rolling walker slow cadence with decreased step length bilaterally, decreased hip and knee flexion right and decreased weight shift to right Palpation: increased tenderness and spasms along lateral thigh/ITB and quadriceps muscle right LE as compared to left ROM: right knee decreased flexion 0-110 degrees with effort, hip flexion WFL's, decreased hip extension in standing, ankle WNL Strength: decreased throughout right LE: hip flexion 3/5, knee extension 3+/5, knee flexion 3+/5 (grossly tested in sitting) Transfer sit to stand from standard chair: slow, leans far forward over knees to stand up  Outcome measures: LEFS: 8/64, 10 MW 36.8 seconds  (0.27 m/s  Decreased significantly for community ambulation/ in need of intervention to reduce falls risk: normal speed = 0.833 - 1.2 m/s)        [facility] PT Assessment - 07/30/14 1124    Assessment   Medical Diagnosis Z47.1, Z96.641: Aftercare following right hip joint replacement surgery   Onset Date/Surgical Date 04/04/14   Hand Dominance Right   Next MD Visit one month   Prior Therapy home health therapy x 8 weeks prior to coming to out patient   Precautions   Precautions None   Restrictions   Weight Bearing Restrictions No   Balance Screen   Has the patient fallen in the past 6 months Yes   How many times? 25 March 2014: following right THR ( late Fall of 2015)   Has the patient had a decrease in activity level because of a fear of falling?  Yes   Is the patient reluctant to leave their home because of a fear of falling?  No   Home [employer] residence   Living Arrangements Spouse/significant other   Home Access  Ramped entrance   [place] - 2 wheels;Bedside commode;Shower seat;Toilet riser;Grab bars - toilet;Grab bars - tub/shower   Prior Function   Level of Independence Independent;Independent with basic ADLs   Cognition   Overall Cognitive Status Within Functional Limits for tasks assessed   Memory Impaired   Memory Impairment Other (comment)  Patient reports short term memore impairment since recent su   Ambulation/Gait   Ambulation/Gait Yes   Assistive device Rolling walker   Gait Pattern Within Functional Limits   Ambulation Surface Level           PT Education -  08/22/2014 1110    Education provided Yes   Education Details Instructed in home program of exercises: AROM with ball underfoot,  hip adduction with glute isometrics with ball between knes x 10 reps   Person(s) Educated Patient;Spouse   Methods Explanation;Demonstration;Verbal cues   Comprehension Verbalized understanding;Returned demonstration;Verbal cues required             PT Long Term Goals - 2014-08-22 1204    PT LONG TERM GOAL #1   Title Patient will demonstrate improved function with daily tasks involving right LE as indicated by LEFS score of 30/64 or better by 08/22/2014   Baseline 8/64 (severe self perceived disabiltiy)   Status New   PT LONG TERM GOAL #2   Title Patient will improve 10 MW score to 18 seconds or less indicating improved community ambulation and decrease fall risk by 08/22/2014   Baseline 36.8seconds   Status New   PT LONG TERM GOAL #3   Title Patient will be able to ambulate without assistive device for short household ambulation by 08/22/2014   Baseline unable to ambulate without rolling walker for any distance   Status New   PT LONG TERM GOAL #4   Title Patient will improve LEFS to 40/64 or better by 09/11/2014 demonstrating improved function with daily tasks involving LE   Baseline 8/64   Status New   PT LONG TERM GOAL #5   Title Patient will  improve 10MW to 15 seconds or less demonstrating improved community ambulation and decreased for falls by 09/11/2014   Baseline 10MW 36.8 seconds   Status New   Additional Long Term Goals   Additional Long Term Goals Yes   PT LONG TERM GOAL #6   Title Patient will be independent with home program without verbal cuing and be able to self manage symptoms and exercises by 09/11/2014   Baseline Patient has limited knowledg of appropriate exercises and progression to improve function and self manage symptoms at home   Status New               Plan - August 22, 2014 1200    Clinical Impression Statement Patient is a 69 year old female who presents s/p hip fracture with surgery and total hip revision 04/04/2014. She has had home health PT and is now referred to out patient physical therapy for additional strengthening and to improve ROM, endurance and walking. She has decreased strength and endurance and difficulty with walking. She has LEFS score of 8/64 and 10 MW of 36.8 seconds indicating significant need for intervention. She has limited knowledge of appropriate exercise to improve function and walking.    Pt will benefit from skilled therapeutic intervention in order to improve on the following deficits Difficulty walking;Decreased endurance;Pain;Decreased strength   Rehab Potential Good   Clinical Impairments Affecting Rehab Potential (+) motivated, family support   (-) multiple comorbidities: history of CA, colon resection, THR and subsequent fall with femur fracture and THR revision    PT Frequency 2x / week   PT Duration 6 weeks   PT Treatment/Interventions Patient/family education;Therapeutic exercise;Manual techniques;Moist Heat;Cryotherapy   PT Next Visit Plan manual therapy to help with spasms, and pain, therapeutic exercise for strength and endurance   PT Home Exercise Plan AROM/control, strength and core           G-Codes - 2014-08-22 1210    Functional Assessment Tool Used LEFS, 10MW,  clinical judgment, pain scale, strength deficits, ROM deficits   Functional Limitation Mobility: Walking and moving  around   Mobility: Walking and Moving Around Current Status [phone]) At least 60 percent but less than 80 percent impaired, limited or restricted   Mobility: Walking and Moving Around Goal Status [phone]) At least 20 percent but less than 40 percent impaired, limited or restricted       Problem List Patient Active Problem List   Diagnosis Date Noted  . Skin lesion of chest wall 07/17/2014  . Breast cancer 03/07/2013  . Personal history of malignant neoplasm of large intestine   . 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 Cancer 12/24/2009  . ANXIETY 06/11/2008  . CONSTIPATION, CHRONIC 06/11/2008  . FIBROMYALGIA 06/11/2008    Jomarie Longs PT 07/31/2014, 5:29 PM  [place] [facility] [address] [place], [address] Phone: [phone]   Fax:  [phone]

## 2014-08-01 ENCOUNTER — Ambulatory Visit: Payer: Medicare Other | Attending: Student | Admitting: Physical Therapy

## 2014-08-01 ENCOUNTER — Encounter: Payer: Self-pay | Admitting: Physical Therapy

## 2014-08-01 DIAGNOSIS — M25551 Pain in right hip: Secondary | ICD-10-CM | POA: Insufficient documentation

## 2014-08-01 DIAGNOSIS — M6281 Muscle weakness (generalized): Secondary | ICD-10-CM | POA: Diagnosis not present

## 2014-08-01 DIAGNOSIS — R262 Difficulty in walking, not elsewhere classified: Secondary | ICD-10-CM | POA: Diagnosis not present

## 2014-08-01 DIAGNOSIS — Z96641 Presence of right artificial hip joint: Secondary | ICD-10-CM | POA: Insufficient documentation

## 2014-08-01 DIAGNOSIS — Z471 Aftercare following joint replacement surgery: Secondary | ICD-10-CM | POA: Diagnosis not present

## 2014-08-01 NOTE — Therapy (Signed)
[place] [facility] [address] [place], [address] Phone: [phone]   Fax:  [phone]  Physical Therapy Treatment  Patient Details  Name: Terri Perez MRN: [id number] Date of Birth: 08-25-1945 Referring Provider:  Vela Prose, MD  Encounter Date: 08/01/2014      PT End of Session - 08/01/14 1200    Visit Number 2   Number of Visits 12   Date for PT Re-Evaluation 09/11/14   Authorization Type 2   Authorization Time Period 10   PT Start Time 1102   PT Stop Time 1148   PT Time Calculation (min) 46 min   Activity Tolerance Patient tolerated treatment well   Behavior During Therapy [facility] for tasks assessed/performed      Past Medical History  Diagnosis Date  . Personal history of fibromyalgia 2002  . Unspecified constipation   . H/O cystitis 2011  . Special screening for malignant neoplasms, colon   . Arm fracture     right forearm, d/t fall  . Personal history of malignant neoplasm of large intestine   . Bowel trouble 2010  . Cancer 12/2009    Left UOQ breast tumor; wide excision,sn bx and whole breast radiation. Chemotherapy done. There was only a 47m area of residual tumor remaining. All sn were negative. This was ER-positive, PR-negative, HER-2 neu not over expressed tumor. The original ultrasound size was 2.6 cm(T2).  . 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 On wide excision 3 mm area of residual tumor. Sentinel node negative. ER-30%, PR-negative, HER-2 not overexpressing.  . Breast cancer 12/2009    left, chemo and radiation    Past Surgical History  Procedure Laterality Date  . Colonoscopy  2010    Dr. WAllen Norris normal  . Portacath placement  2011  . Port-a-cath removal  2013  . Breast surgery Left 2011    wide excision  . Breast biopsy Left 2011  . Breast cyst aspiration Left 1985  . Skin cancer  excision  1980    face  . Tubal ligation  1973  . Abdominal hysterectomy  1974  . Back surgery  1985    bone spurs between 5&6, used bone from left hip  . [facility]  . Femur fracture surgery Right March 2016  . Joint replacement Bilateral Jan 2016 and March 2016    hip    There were no vitals filed for this visit.  Visit Diagnosis:  Pain in right hip  Muscle weakness  Pain in the hip, right      Subjective Assessment - 08/01/14 1104    Subjective Patient reports she is moving better today, she is exercising at home as instructed. She is monitoring activity to not have increased sympotms.    Limitations Sitting;Walking;House hold activities;Other (comment)  sleeping, prolonged walking, standing   Patient Stated Goals She wants to get her "right leg in motion"   Currently in Pain? Yes   Pain Score 6    Pain Location Leg  constant ache in groin as well   Pain Orientation Right   Pain Descriptors / Indicators Aching   Pain Type Other (Comment)  s/p fracture/surgical pain   Pain Onset More than a month ago   Pain Frequency Intermittent   Multiple Pain Sites No      Objective:  Gait: ambulating with rolling  walker into clinic with improved cadence as compared to previous session, decreased right heel toe gait pattern and decreased weight shift right, IR at hip, decreased hip extension and push off right LE Palpation: + spasms and tenderness along quadriceps, lateral thight right LE and decreased patella mobility all directions AAROM: right knee in sitting: 0-110 degrees flexion        [facility] Adult PT Treatment/Exercise - 08/01/14 0001    Exercises   Exercises Other Exercises   Other Exercises  sitting on treatment table: AAROM right knee flexion/extension with ball and assistance of therapist through full ROM 0-110 degrees, isometric knee flexion with assistance multiple angles x 3 sets with 5 second holds, hip extension wtih manual resistance  given x 5 reps with verbal cuing, sit to stand on balance pad form high table (~25") x 2 reps with standing weight shifting with contact guard of therapist, verbal cuing and holding onto walker for support, NuStep x 5 min. level #2 workload with verbal cuing to position LE's without excessive IR at hips and to press heels into pedals to simulate walking    Manual Therapy   Manual Therapy Soft tissue mobilization   Soft tissue mobilization with patient seated on treatemnt table: STM performed superficial techniques to lateral aspect of thigh and along quadriceps attachment to attachment, patella mobilization performed with patella mobilizer x 2 sets      Patient response to treatment: improved flexibility into flexion right knee per patient report following patella mobilization and NUstep exercises, improved cadence as noted with 10MW 24 seconds as compared to 36 seconds previous session, improved weight shift and balance with repetition of exercises in standing and reported soreness in thigh following manual therapy treatment and exercise, no worse.           PT Education - 08/01/14 1130    Education provided Yes   Education Details Reinstructed in home exercises to be performed for ROM and weight shifting and to work on more neutral alignment of right hip during sitting and walking   Person(s) Educated Patient   Methods Explanation;Demonstration;Verbal cues;Tactile cues   Comprehension Verbalized understanding;Returned demonstration;Verbal cues required             PT Long Term Goals - 07/30/14 1204    PT LONG TERM GOAL #1   Title Patient will demonstrate improved function with daily tasks involving right LE as indicated by LEFS score of 30/64 or better by 08/22/2014   Baseline 8/64 (severe self perceived disabiltiy)   Status New   PT LONG TERM GOAL #2   Title Patient will improve 10 MW score to 18 seconds or less indicating improved community ambulation and decrease fall risk by  08/22/2014   Baseline 36.8seconds   Status New   PT LONG TERM GOAL #3   Title Patient will be able to ambulate without assistive device for short household ambulation by 08/22/2014   Baseline unable to ambulate without rolling walker for any distance   Status New   PT LONG TERM GOAL #4   Title Patient will improve LEFS to 40/64 or better by 09/11/2014 demonstrating improved function with daily tasks involving LE   Baseline 8/64   Status New   PT LONG TERM GOAL #5   Title Patient will improve 10MW to 15 seconds or less demonstrating improved community ambulation and decreased for falls by 09/11/2014   Baseline 10MW 36.8 seconds   Status New   Additional Long Term Goals   Additional Long Term  Goals Yes   PT LONG TERM GOAL #6   Title Patient will be independent with home program without verbal cuing and be able to self manage symptoms and exercises by 09/11/2014   Baseline Patient has limited knowledg of appropriate exercises and progression to improve function and self manage symptoms at home   [place] - 08/01/14 1200    Clinical Impression Statement Patient is progressing well with improved cadence with walking as measured with 10MW to 24 seconds from 36 seconds on 07/30/2014. She is progressing with improved ROM and soft tissue mobility in right LE as well. She is motivated and should continue to improve endurance, strength and walking ability with continued physical therapy intervention.    Pt will benefit from skilled therapeutic intervention in order to improve on the following deficits Difficulty walking;Decreased endurance;Pain;Decreased strength   Rehab Potential Good   PT Frequency 2x / week   PT Duration 6 weeks   PT Treatment/Interventions Patient/family education;Therapeutic exercise;Manual techniques;Moist Heat;Cryotherapy   PT Next Visit Plan manual therapy to help with spasms, and pain, therapeutic exercise for strength and endurance         Problem List Patient Active Problem List   Diagnosis Date Noted  . Skin lesion of chest wall 07/17/2014  . Breast cancer 03/07/2013  . Personal history of malignant neoplasm of large intestine   . 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 Cancer 12/24/2009  . ANXIETY 06/11/2008  . CONSTIPATION, CHRONIC 06/11/2008  . FIBROMYALGIA 06/11/2008    Jomarie Longs PT 08/01/2014, 4:31 PM  [place] [facility] [address], [place], [address] Phone: [phone]   Fax:  [phone]

## 2014-08-04 ENCOUNTER — Encounter: Payer: Self-pay | Admitting: Physical Therapy

## 2014-08-04 ENCOUNTER — Ambulatory Visit: Payer: Medicare Other | Admitting: Physical Therapy

## 2014-08-04 DIAGNOSIS — M25551 Pain in right hip: Secondary | ICD-10-CM | POA: Diagnosis not present

## 2014-08-04 DIAGNOSIS — R262 Difficulty in walking, not elsewhere classified: Secondary | ICD-10-CM | POA: Diagnosis not present

## 2014-08-04 DIAGNOSIS — M6281 Muscle weakness (generalized): Secondary | ICD-10-CM

## 2014-08-04 NOTE — Therapy (Signed)
[place] [facility] [address] [place], [address] Phone: [phone]   Fax:  [phone]  Physical Therapy Treatment  Patient Details  Name: Terri Perez MRN: [id number] Date of Birth: [birth year] Referring Provider:  Vela Prose, MD  Encounter Date: 08/04/2014      PT End of Session - 08/04/14 1622    Visit Number 3   Number of Visits 12   Date for PT Re-Evaluation 09/11/14   Authorization Type 3   Authorization Time Period 10   PT Start Time 1533   PT Stop Time 1622   PT Time Calculation (min) 49 min   Activity Tolerance Patient tolerated treatment well   Behavior During Therapy [facility] for tasks assessed/performed      Past Medical History  Diagnosis Date  . Personal history of fibromyalgia 2002  . Unspecified constipation   . H/O cystitis 2011  . Special screening for malignant neoplasms, colon   . Arm fracture     right forearm, d/t fall  . Personal history of malignant neoplasm of large intestine   . Bowel trouble 2010  . Cancer 12/2009    Left UOQ breast tumor; wide excision,sn bx and whole breast radiation. Chemotherapy done. There was only a 40m area of residual tumor remaining. All sn were negative. This was ER-positive, PR-negative, HER-2 neu not over expressed tumor. The original ultrasound size was 2.6 cm(T2).  . 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 On wide excision 3 mm area of residual tumor. Sentinel node negative. ER-30%, PR-negative, HER-2 not overexpressing.  . Breast cancer 12/2009    left, chemo and radiation    Past Surgical History  Procedure Laterality Date  . Colonoscopy  2010    Dr. WAllen Norris normal  . Portacath placement  2011  . Port-a-cath removal  2013  . Breast surgery Left 2011    wide excision  . Breast biopsy Left 2011  . Breast cyst aspiration Left 1985  . Skin cancer  excision  1980    face  . Tubal ligation  1973  . Abdominal hysterectomy  1974  . Back surgery  1985    bone spurs between 5&6, used bone from left hip  . [facility]  . Femur fracture surgery Right March 2016  . Joint replacement Bilateral Jan 2016 and March 2016    hip    There were no vitals filed for this visit.  Visit Diagnosis:  Pain in right hip  Muscle weakness  Difficulty walking      Subjective Assessment - 08/04/14 1539    Subjective Patient reports she is moving better today, she is exercising at home as instructed. She is monitoring activity to not have increased sympotms. feels a little tired today and stiff in right LE.    Currently in Pain? No/denies  patient reports stiffness in right hip       Objective: Gait: ambulating into clinic with rolling walker, improved cadence noted, re assessed 10MW = 22 seconds (24 seconds previous session) Palpation: right thigh with decreased soft tissue elasticity and lateral thigh increased tenderness and decreased soft tissue elasticity        [facility] Adult PT Treatment/Exercise - 08/04/14 1540    Exercises   Exercises Other Exercises   Other Exercises  sitting on treatment table: AAROM right knee flexion/extension with ball  and assistance of therapist through full ROM, isometric knee flexion with assistance multiple angles x 3 sets with 5 second holds, sit to stand on balance pad fom high table (~25") x 3 reps x 3 sets, hip abduction  With ER against red resistive band 5 x right , 5x left and 5x double, knee flexion with resistive band (red) x 10 reps, walk 10 meters with walker,  NuStep x 5 min. level #2 workload with verbal cuing to position LE's without excessive IR at hips and to press heels into pedals to simulate walking    Manual Therapy   Manual Therapy Soft tissue mobilization   Soft tissue mobilization with patient seated on treatemnt table: STM performed superficial techniques to  lateral aspect of thigh and along quadriceps attachment to attachment, patella mobilization performed with patella mobilizer x 2 sets      Patient response to treatment: improved soft tissue elasticity with improved ability to transfer sit to stand  With less difficulty, required verbal cues to perform exercises with correct position and technique, improved gait pattern with decreased IR at right hip following treatment          PT Education - 08/04/14 1618    Education provided Yes   Education Details Instructed in new exercises for home: abduction in sitting with resistive band(red), and knee flexion with red resistive band   Person(s) Educated Patient;Spouse   Methods Explanation;Demonstration;Verbal cues   Comprehension Verbalized understanding;Returned demonstration;Verbal cues required;Need further instruction             PT Long Term Goals - 07/30/14 1204    PT LONG TERM GOAL #1   Title Patient will demonstrate improved function with daily tasks involving right LE as indicated by LEFS score of 30/64 or better by 08/22/2014   Baseline 8/64 (severe self perceived disabiltiy)   Status New   PT LONG TERM GOAL #2   Title Patient will improve 10 MW score to 18 seconds or less indicating improved community ambulation and decrease fall risk by 08/22/2014   Baseline 36.8seconds   Status New   PT LONG TERM GOAL #3   Title Patient will be able to ambulate without assistive device for short household ambulation by 08/22/2014   Baseline unable to ambulate without rolling walker for any distance   Status New   PT LONG TERM GOAL #4   Title Patient will improve LEFS to 40/64 or better by 09/11/2014 demonstrating improved function with daily tasks involving LE   Baseline 8/64   Status New   PT LONG TERM GOAL #5   Title Patient will improve 10MW to 15 seconds or less demonstrating improved community ambulation and decreased for falls by 09/11/2014   Baseline 10MW 36.8 seconds   Status  New   Additional Long Term Goals   Additional Long Term Goals Yes   PT LONG TERM GOAL #6   Title Patient will be independent with home program without verbal cuing and be able to self manage symptoms and exercises by 09/11/2014   Baseline Patient has limited knowledg of appropriate exercises and progression to improve function and self manage symptoms at home   Status New               Plan - 08/04/14 1622    Clinical Impression Statement Patient is progressing with improved cadence noted with 10MW today from 24 seconds to 22 seconds. She continues with decreased endurance for daily tasks, increased spasms in right thigh lateral and anterior  aspects and will require continued physical therapy intervention to achieve full indepednence with home exercises and be able to walk with less difficulty and dedreased fall risk.    Pt will benefit from skilled therapeutic intervention in order to improve on the following deficits Difficulty walking;Decreased endurance;Pain;Decreased strength   Rehab Potential Good   PT Frequency 2x / week   PT Duration 6 weeks   PT Treatment/Interventions Patient/family education;Therapeutic exercise;Manual techniques;Moist Heat;Cryotherapy   PT Next Visit Plan manual therapy to help with spasms, and pain, therapeutic exercise for strength and endurance   PT Home Exercise Plan added hip abduction and knee flexion against resistive band        Problem List Patient Active Problem List   Diagnosis Date Noted  . Skin lesion of chest wall 07/17/2014  . Breast cancer 03/07/2013  . Personal history of malignant neoplasm of large intestine   . 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 Cancer 12/24/2009  . ANXIETY 06/11/2008  . CONSTIPATION, CHRONIC 06/11/2008  . FIBROMYALGIA 06/11/2008    Jomarie Longs PT 08/04/2014, 7:58 PM  [place] [address] [place],  [address] Phone: [phone]   Fax:  [phone]

## 2014-08-06 ENCOUNTER — Encounter: Payer: Self-pay | Admitting: Physical Therapy

## 2014-08-06 ENCOUNTER — Ambulatory Visit: Payer: Medicare Other | Admitting: Physical Therapy

## 2014-08-06 DIAGNOSIS — R262 Difficulty in walking, not elsewhere classified: Secondary | ICD-10-CM

## 2014-08-06 DIAGNOSIS — M6281 Muscle weakness (generalized): Secondary | ICD-10-CM | POA: Diagnosis not present

## 2014-08-06 DIAGNOSIS — M25551 Pain in right hip: Secondary | ICD-10-CM

## 2014-08-06 NOTE — Therapy (Signed)
[facility] [address] [place], [place], [address] Phone: [phone]   Fax:  [phone]  Physical Therapy Treatment  Patient Details  Name: Terri Perez MRN: [id number] Date of Birth: 08/09/1945 Referring Provider:  Langfitt, Maxwell, MD  Encounter Date: 08/06/2014      PT End of Session - 08/06/14 1200    Visit Number 4   Number of Visits 12   Date for PT Re-Evaluation 09/11/14   Authorization Type 4   Authorization Time Period 10   PT Start Time 1105   PT Stop Time 1155   PT Time Calculation (min) 50 min   Activity Tolerance Patient tolerated treatment well   Behavior During Therapy [facility] for tasks assessed/performed      Past Medical History  Diagnosis Date  . Personal history of fibromyalgia 2002  . Unspecified constipation   . H/O cystitis 2011  . Special screening for malignant neoplasms, colon   . Arm fracture     right forearm, d/t fall  . Personal history of malignant neoplasm of large intestine   . Bowel trouble 2010  . Cancer 12/2009    Left UOQ breast tumor; wide excision,sn bx and whole breast radiation. Chemotherapy done. There was only a 3mm area of residual tumor remaining. All sn were negative. This was ER-positive, PR-negative, HER-2 neu not over expressed tumor. The original ultrasound size was 2.6 cm(T2).  . 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 On wide excision 3 mm area of residual tumor. Sentinel node negative. ER-30%, PR-negative, HER-2 not overexpressing.  . Breast cancer 12/2009    left, chemo and radiation    Past Surgical History  Procedure Laterality Date  . Colonoscopy  2010    Dr. Wohl, normal  . Portacath placement  2011  . Port-a-cath removal  2013  . Breast surgery Left 2011    wide excision  . Breast biopsy Left 2011  . Breast cyst aspiration Left 1985  . Skin cancer  excision  1980    face  . Tubal ligation  1973  . Abdominal hysterectomy  1974  . Back surgery  1985    bone spurs between 5&6, used bone from left hip  . Colon resection  Sept 2014    [facility]   . Femur fracture surgery Right March 2016  . Joint replacement Bilateral Jan 2016 and March 2016    hip    There were no vitals filed for this visit.  Visit Diagnosis:  Pain in right hip  Muscle weakness  Difficulty walking      Subjective Assessment - 08/06/14 1107    Subjective reports increased right hip pain after exercising with resistive band at home: hip abduction in sitting. She feels she tried to push out too hard and it made her inner thigh/groin hurt. It is better today.   Currently in Pain? No/denies  right hip stiffness   Pain Score 7    Pain Location Groin   Pain Orientation Right   Pain Descriptors / Indicators Aching   Pain Type Other (Comment)  s/p fracture and surgical pain   Pain Onset More than a month ago   Pain Frequency Intermittent   Multiple Pain Sites No      Objective: Gait: ambulating into clinic with rolling walker, slow cadence, right hip internally rotated and presents with lateral whip with swing through          [facility] Adult PT Treatment/Exercise - 08/06/14 1113    Exercises   Exercises Other Exercises   Other Exercises  sitting on treatment table: AAROM right knee flexion/extension with ball and assistance of therapist through full ROM 0-115 degrees, isometric knee flexion with assistance multiple angles x 3 sets with 5 second holds, knee flexion with resistive band x 15 reps,  hip extension wtih band resistance given x 10 reps with verbal cuing, sit to stand on balance pad form high table (~25") x 10 reps, NuStep x 7 min. level #3 workload with verbal cuing to position LE's without excessive IR at hips and to press heels into pedals to simulate walking    Manual Therapy   Manual Therapy Soft tissue mobilization   Soft tissue mobilization with  patient seated on treatemnt table: STM performed superficial techniques to lateral aspect of thigh and along quadriceps attachment to attachment, patella mobilization performed with patella mobilizer x 2 sets      Patient response to treatment: Patient demonstrates improvement with walking and improving ROM in right knee flexion. She continues to require verbal cuing with exercises for correct technique, improved ability to walk with increased step length and cadence at end of session          PT Education - 08/06/14 1155    Education provided Yes   Education Details instructed to contiinue with exercises as instructed   Person(s) Educated Patient;Spouse   Methods Explanation;Demonstration;Verbal cues   Comprehension Verbalized understanding;Returned demonstration;Verbal cues required             PT Long Term Goals - 07/30/14 1204    PT LONG TERM GOAL #1   Title Patient will demonstrate improved function with daily tasks involving right LE as indicated by LEFS score of 30/64 or better by 08/22/2014   Baseline 8/64 (severe self perceived disabiltiy)   Status New   PT LONG TERM GOAL #2   Title Patient will improve 10 MW score to 18 seconds or less indicating improved community ambulation and decrease fall risk by 08/22/2014   Baseline 36.8seconds   Status New   PT LONG TERM GOAL #3   Title Patient will be able to ambulate without assistive device for short household ambulation by 08/22/2014   Baseline unable to ambulate without rolling walker for any distance   Status New   PT LONG TERM GOAL #4   Title Patient will improve LEFS to 40/64 or better by 09/11/2014 demonstrating improved function with daily tasks involving LE   Baseline 8/64   Status New   PT LONG TERM GOAL #5   Title Patient will improve 10MW to 15 seconds or less demonstrating improved community ambulation and decreased for falls by 09/11/2014   Baseline 10MW 36.8 seconds   Status New   Additional Long Term Goals    Additional Long Term Goals Yes   PT LONG TERM GOAL #6   Title Patient will be independent with home program without verbal cuing and be able to self manage symptoms and exercises by 09/11/2014   Baseline Patient has limited knowledg of appropriate exercises and progression to improve function and self manage symptoms at home   Status New               Plan - 08/06/14 1200    Clinical Impression Statement Patient demonstrated improved ROM in right knee by 5 degrees from 110 to 115 degrees and demonstrated improved soft tissue elasticity in quadriceps and lateral thigh. She conintues with   pain and limitations in knee ROM and strength and will require additional physical therapy intervention to allow improved function with walking and daily activities.    Pt will benefit from skilled therapeutic intervention in order to improve on the following deficits Difficulty walking;Decreased endurance;Pain;Decreased strength   Rehab Potential Good   PT Frequency 2x / week   PT Duration 6 weeks   PT Treatment/Interventions Patient/family education;Therapeutic exercise;Manual techniques;Moist Heat;Cryotherapy   PT Next Visit Plan manual therapy to help with spasms, and pain, therapeutic exercise for strength and endurance        Problem List Patient Active Problem List   Diagnosis Date Noted  . Skin lesion of chest wall 07/17/2014  . Breast cancer 03/07/2013  . Personal history of malignant neoplasm of large intestine   . 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 Cancer 12/24/2009  . ANXIETY 06/11/2008  . CONSTIPATION, CHRONIC 06/11/2008  . FIBROMYALGIA 06/11/2008    Jomarie Longs PT 08/06/2014, 11:00 PM  [facility] [address] [place], [address] Phone: [phone]   Fax:  [phone]

## 2014-08-11 ENCOUNTER — Encounter: Payer: Self-pay | Admitting: Physical Therapy

## 2014-08-11 ENCOUNTER — Ambulatory Visit: Payer: Medicare Other | Admitting: Physical Therapy

## 2014-08-11 DIAGNOSIS — M25551 Pain in right hip: Secondary | ICD-10-CM | POA: Diagnosis not present

## 2014-08-11 DIAGNOSIS — R262 Difficulty in walking, not elsewhere classified: Secondary | ICD-10-CM

## 2014-08-11 DIAGNOSIS — M6281 Muscle weakness (generalized): Secondary | ICD-10-CM

## 2014-08-11 NOTE — Therapy (Signed)
[place] [facility] [facility] [address], [place], [address] Phone: [phone]   Fax:  [phone]  Physical Therapy Treatment  Patient Details  Name: Terri Perez MRN: [id number] Date of Birth: 1945-08-19 Referring Provider:  Vela Prose, MD  Encounter Date: 08/11/2014      PT End of Session - 08/11/14 1210    Visit Number 5   Number of Visits 12   Date for PT Re-Evaluation 09/11/14   Authorization Type 5   Authorization Time Period 10   PT Start Time 1120   PT Stop Time 1208   PT Time Calculation (min) 48 min   Activity Tolerance Patient tolerated treatment well   Behavior During Therapy [facility] for tasks assessed/performed      Past Medical History  Diagnosis Date  . Personal history of fibromyalgia 2002  . Unspecified constipation   . H/O cystitis 2011  . Special screening for malignant neoplasms, colon   . Arm fracture     right forearm, d/t fall  . Personal history of malignant neoplasm of large intestine   . Bowel trouble 2010  . Cancer 12/2009    Left UOQ breast tumor; wide excision,sn bx and whole breast radiation. Chemotherapy done. There was only a 67m area of residual tumor remaining. All sn were negative. This was ER-positive, PR-negative, HER-2 neu not over expressed tumor. The original ultrasound size was 2.6 cm(T2).  . 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 On wide excision 3 mm area of residual tumor. Sentinel node negative. ER-30%, PR-negative, HER-2 not overexpressing.  . Breast cancer 12/2009    left, chemo and radiation    Past Surgical History  Procedure Laterality Date  . Colonoscopy  2010    Dr. WAllen Norris normal  . Portacath placement  2011  . Port-a-cath removal  2013  . Breast surgery Left 2011    wide excision  . Breast biopsy Left 2011  . Breast cyst aspiration Left 1985  . Skin cancer  excision  1980    face  . Tubal ligation  1973  . Abdominal hysterectomy  1974  . Back surgery  1985    bone spurs between 5&6, used bone from left hip  . [facility]  . Femur fracture surgery Right March 2016  . Joint replacement Bilateral Jan 2016 and March 2016    hip    There were no vitals filed for this visit.  Visit Diagnosis:  Muscle weakness  Difficulty walking  Pain in the hip, right      Subjective Assessment - 08/11/14 1122    Subjective Patient reports she is exercising at home with standing, walking and ROM ex's with seated bike (on floor), ball and inner thigh exercises. She reports getting sore with ROM with bike exercise, groin is feeling better. She is now able to stand and brush teeth, wash hair, (activities that she could not do prior to out patient therapy).    Limitations Sitting;Walking;House hold activities;Other (comment)  continues with difficulty sleeping (from intenstinal surgery), walking, standing activities   Patient Stated Goals She wants to get her "right leg in motion"   Currently in Pain? No/denies   Multiple Pain Sites No       Objective: Gait: ambulating with rolling walker with steady cadence, improved right hip extension Palpation: + spasms and decreased soft tissue  elasticity along right quadriceps and along lateral thigh, incision       [facility] Adult PT Treatment/Exercise - 08/11/14 1127    Exercises   Exercises Other Exercises   Other Exercises  sitting on treatment table: AAROM right knee flexion/extension with ball and assistance of therapist through full ROM 0-115 degrees, isometric knee flexion with assistance multiple angles x 3 sets with 5 second holds, hip extension with manual resistance given x 5 reps with verbal cuing, sit to stand on from high table (~25") x 8 reps with guidance of therapist for proper weight shift to right LE with ball betweeen knees, NuStep x10 min. level #3 workload with verbal  cuing to position LE's without excessive IR at hips and to press heels into pedals to simulate walking    Manual Therapy   Manual Therapy Soft tissue mobilization   Soft tissue mobilization with patient seated on treatemnt table: STM performed superficial techniques to lateral aspect of thigh and along quadriceps attachment to attachment, patella mobilization performed with patella mobilizer x 2 sets     Patient response to treatment: improved soft tissue elasticity, improved knee flexibility into flexion right knee, required assistance to perform all exercises, improved sit to stand off treatment table with guidance and tactile cuing           PT Education - 08/11/14 1120    Education provided Yes   Education Details required verbal cuing and guidance for all exercises    Person(s) Educated Patient   Methods Explanation;Verbal cues   Comprehension Verbalized understanding;Returned demonstration;Verbal cues required             PT Long Term Goals - 07/30/14 1204    PT LONG TERM GOAL #1   Title Patient will demonstrate improved function with daily tasks involving right LE as indicated by LEFS score of 30/64 or better by 08/22/2014   Baseline 8/64 (severe self perceived disabiltiy)   Status New   PT LONG TERM GOAL #2   Title Patient will improve 10 MW score to 18 seconds or less indicating improved community ambulation and decrease fall risk by 08/22/2014   Baseline 36.8seconds   Status New   PT LONG TERM GOAL #3   Title Patient will be able to ambulate without assistive device for short household ambulation by 08/22/2014   Baseline unable to ambulate without rolling walker for any distance   Status New   PT LONG TERM GOAL #4   Title Patient will improve LEFS to 40/64 or better by 09/11/2014 demonstrating improved function with daily tasks involving LE   Baseline 8/64   Status New   PT LONG TERM GOAL #5   Title Patient will improve 10MW to 15 seconds or less demonstrating  improved community ambulation and decreased for falls by 09/11/2014   Baseline 10MW 36.8 seconds   Status New   Additional Long Term Goals   Additional Long Term Goals Yes   PT LONG TERM GOAL #6   Title Patient will be independent with home program without verbal cuing and be able to self manage symptoms and exercises by 09/11/2014   Baseline Patient has limited knowledg of appropriate exercises and progression to improve function and self manage symptoms at home   Status New               Plan - 08/11/14 1215    Clinical Impression Statement Patient is progressing with imporved endurance and strength with guided strengthening exercises. She continues with weakness in  right LE and decreased endurance for community ambulation and daily tasks and will therefore require additional physical therapy intervention.    Pt will benefit from skilled therapeutic intervention in order to improve on the following deficits Difficulty walking;Decreased endurance;Pain;Decreased strength   Rehab Potential Good   PT Frequency 2x / week   PT Duration 6 weeks   PT Treatment/Interventions Patient/family education;Therapeutic exercise;Manual techniques;Moist Heat;Cryotherapy   PT Next Visit Plan manual therapy to help with spasms, and pain, therapeutic exercise for strength and endurance        Problem List Patient Active Problem List   Diagnosis Date Noted  . Skin lesion of chest wall 07/17/2014  . Breast cancer 03/07/2013  . Personal history of malignant neoplasm of large intestine   . 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 Cancer 12/24/2009  . ANXIETY 06/11/2008  . CONSTIPATION, CHRONIC 06/11/2008  . FIBROMYALGIA 06/11/2008    Jomarie Longs PT 08/11/2014, 9:52 PM  [place] [facility] [address], [place], [address] Phone: [phone]   Fax:  [phone]

## 2014-08-12 ENCOUNTER — Encounter: Payer: Medicare Other | Admitting: Physical Therapy

## 2014-08-13 ENCOUNTER — Ambulatory Visit: Payer: Medicare Other | Admitting: Physical Therapy

## 2014-08-13 ENCOUNTER — Encounter: Payer: Self-pay | Admitting: Physical Therapy

## 2014-08-13 DIAGNOSIS — M25551 Pain in right hip: Secondary | ICD-10-CM | POA: Diagnosis not present

## 2014-08-13 DIAGNOSIS — M6281 Muscle weakness (generalized): Secondary | ICD-10-CM

## 2014-08-13 DIAGNOSIS — R262 Difficulty in walking, not elsewhere classified: Secondary | ICD-10-CM

## 2014-08-13 NOTE — Therapy (Signed)
[facility] [address] [place], [address] Phone: [phone]   Fax:  [phone]  Physical Therapy Treatment  Patient Details  Name: Terri Perez MRN: [id number] Date of Birth: 1945/05/17 Referring Provider:  Vela Prose, MD  Encounter Date: 08/13/2014      PT End of Session - 08/13/14 1500    Visit Number 6   Number of Visits 12   Date for PT Re-Evaluation 09/11/14   Authorization Type 6   Authorization Time Period 10   PT Start Time 1405   PT Stop Time 1445   PT Time Calculation (min) 40 min   Activity Tolerance Patient tolerated treatment well   Behavior During Therapy [facility] for tasks assessed/performed      Past Medical History  Diagnosis Date  . Personal history of fibromyalgia 2002  . Unspecified constipation   . H/O cystitis 2011  . Special screening for malignant neoplasms, colon   . Arm fracture     right forearm, d/t fall  . Personal history of malignant neoplasm of large intestine   . Bowel trouble 2010  . Cancer 12/2009    Left UOQ breast tumor; wide excision,sn bx and whole breast radiation. Chemotherapy done. There was only a 79m area of residual tumor remaining. All sn were negative. This was ER-positive, PR-negative, HER-2 neu not over expressed tumor. The original ultrasound size was 2.6 cm(T2).  . 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 On wide excision 3 mm area of residual tumor. Sentinel node negative. ER-30%, PR-negative, HER-2 not overexpressing.  . Breast cancer 12/2009    left, chemo and radiation    Past Surgical History  Procedure Laterality Date  . Colonoscopy  2010    Dr. WAllen Norris normal  . Portacath placement  2011  . Port-a-cath removal  2013  . Breast surgery Left 2011    wide excision  . Breast biopsy Left 2011  . Breast cyst aspiration Left 1985  . Skin cancer  excision  1980    face  . Tubal ligation  1973  . Abdominal hysterectomy  1974  . Back surgery  1985    bone spurs between 5&6, used bone from left hip  . [facility]  . Femur fracture surgery Right March 2016  . Joint replacement Bilateral Jan 2016 and March 2016    hip    There were no vitals filed for this visit.  Visit Diagnosis:  Muscle weakness  Difficulty walking  Pain in the hip, right      Subjective Assessment - 08/13/14 1411    Subjective Patient reports she is exercising at home with standing, walking and ROM ex's with seated bike (on floor), ball and inner thigh exercises. She reports getting sore with ROM with bike exercise, groin is feeling better. She is now able to stand and brush teeth, wash hair, (activities that she could not do prior to out patient therapy).  Pain:  Mild 2/10 in right groin/ thigh region      Objective: Palpation: increased spasms with decreased soft tissue elasticity along right quadriceps muscle central and lateral thigh Gait: ambulating with rollator with improved hip extension and more equal step length as compared to previous session       [facility] - 08/13/14 1436    Exercises   Exercises Other  Exercises   Other Exercises  sitting on treatment table: AAROM right knee flexion/extension with ball and assistance of therapist through full ROM 0-110 degrees, isometric knee flexion with assistance multiple angles x 3 sets with 5 second holds, knee flexion with red resistive band x 15 reps, isometric hip abduction/ER in sitting with manual resistance given x 10 reps,  Knee extension with 2# weight at ankle 1 x 15 reps with verbal cuing, sit to stand on from high table (~25") x 10 reps with guidance of therapist for proper weight shift with ball betweeen knees,sitting AAROM ER right hip with instruction given to husband and return demonstration with good technique   Manual Therapy   Manual  Therapy Soft tissue mobilization   Soft tissue mobilization with patient seated on treatemnt table: STM performed superficial techniques to lateral aspect of thigh and along quadriceps attachment to attachment, patella mobilization performed with patella mobilizer x 2 sets      Patient response to treatment: improved soft tissue elasticity with improved knee flexibility into flexion following manual STM, patient required verbal cueing and assistance to complete  All exercises, patient and husband verbalized and demonstrated good understanding of home exercises          PT Education - 08/13/14 1500    Education provided Yes   Education Details instructed husband and patient in ER of right LE in sitting with assistance    Person(s) Educated Patient   Methods Explanation;Verbal cues   Comprehension Verbalized understanding;Returned demonstration;Verbal cues required             PT Long Term Goals - 07/30/14 1204    PT LONG TERM GOAL #1   Title Patient will demonstrate improved function with daily tasks involving right LE as indicated by LEFS score of 30/64 or better by 08/22/2014   Baseline 8/64 (severe self perceived disabiltiy)   Status New   PT LONG TERM GOAL #2   Title Patient will improve 10 MW score to 18 seconds or less indicating improved community ambulation and decrease fall risk by 08/22/2014   Baseline 36.8seconds   Status New   PT LONG TERM GOAL #3   Title Patient will be able to ambulate without assistive device for short household ambulation by 08/22/2014   Baseline unable to ambulate without rolling walker for any distance   Status New   PT LONG TERM GOAL #4   Title Patient will improve LEFS to 40/64 or better by 09/11/2014 demonstrating improved function with daily tasks involving LE   Baseline 8/64   Status New   PT LONG TERM GOAL #5   Title Patient will improve 10MW to 15 seconds or less demonstrating improved community ambulation and decreased for falls by  09/11/2014   Baseline 10MW 36.8 seconds   Status New   Additional Long Term Goals   Additional Long Term Goals Yes   PT LONG TERM GOAL #6   Title Patient will be independent with home program without verbal cuing and be able to self manage symptoms and exercises by 09/11/2014   Baseline Patient has limited knowledg of appropriate exercises and progression to improve function and self manage symptoms at home   Status New               Plan - 08/13/14 1500    Clinical Impression Statement Patient is progressing with goals. She demonstrated improved abiltiy to walk with improved cadence with rollator and was able to improve transfer sit to stand with less  difficulty. She continues with limitations of spasms in right quadriceps and limited ROM in right hip and knee and will require additional physical thearpy intervention to achieve independens with walking with minimal deviations and be ableto transition to independent home program.    Pt will benefit from skilled therapeutic intervention in order to improve on the following deficits Difficulty walking;Decreased endurance;Pain;Decreased strength   Rehab Potential Good   PT Frequency 2x / week   PT Duration 6 weeks   PT Treatment/Interventions Patient/family education;Therapeutic exercise;Manual techniques;Moist Heat;Cryotherapy   PT Next Visit Plan manual therapy to help with spasms, and pain, therapeutic exercise for strength and endurance   PT Home Exercise Plan added hip ER AAROM with instructions to husband        Problem List Patient Active Problem List   Diagnosis Date Noted  . Skin lesion of chest wall 07/17/2014  . Breast cancer 03/07/2013  . Personal history of malignant neoplasm of large intestine   . 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 Cancer 12/24/2009  . ANXIETY 06/11/2008  . CONSTIPATION, CHRONIC 06/11/2008  . FIBROMYALGIA 06/11/2008    Jomarie Longs PT 08/13/2014, 10:45 PM  [facility] [address] [place], [address] Phone: [phone]   Fax:  [phone]

## 2014-08-14 ENCOUNTER — Encounter: Payer: Medicare Other | Admitting: Physical Therapy

## 2014-08-20 ENCOUNTER — Ambulatory Visit: Payer: Medicare Other | Admitting: Physical Therapy

## 2014-08-20 ENCOUNTER — Encounter: Payer: Self-pay | Admitting: Physical Therapy

## 2014-08-20 DIAGNOSIS — M6281 Muscle weakness (generalized): Secondary | ICD-10-CM | POA: Diagnosis not present

## 2014-08-20 DIAGNOSIS — M25551 Pain in right hip: Secondary | ICD-10-CM

## 2014-08-20 DIAGNOSIS — R262 Difficulty in walking, not elsewhere classified: Secondary | ICD-10-CM

## 2014-08-20 NOTE — Therapy (Signed)
[facility] [address], [place], [address] Phone: [phone]   Fax:  [phone]  Physical Therapy Treatment  Patient Details  Name: Terri Perez MRN: [id number] Date of Birth: 02/23/1945 Referring Provider:  Vela Prose, MD  Encounter Date: 08/20/2014      PT End of Session - 08/20/14 1630    Visit Number 7   Number of Visits 12   Date for PT Re-Evaluation 09/11/14   Authorization Type 7   Authorization Time Period 10   PT Start Time 1533   PT Stop Time 1618   PT Time Calculation (min) 45 min   Activity Tolerance Patient tolerated treatment well   Behavior During Therapy [facility] for tasks assessed/performed      Past Medical History  Diagnosis Date  . Personal history of fibromyalgia 2002  . Unspecified constipation   . H/O cystitis 2011  . Special screening for malignant neoplasms, colon   . Arm fracture     right forearm, d/t fall  . Personal history of malignant neoplasm of large intestine   . Bowel trouble 2010  . Cancer 12/2009    Left UOQ breast tumor; wide excision,sn bx and whole breast radiation. Chemotherapy done. There was only a 59m area of residual tumor remaining. All sn were negative. This was ER-positive, PR-negative, HER-2 neu not over expressed tumor. The original ultrasound size was 2.6 cm(T2).  . 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 On wide excision 3 mm area of residual tumor. Sentinel node negative. ER-30%, PR-negative, HER-2 not overexpressing.  . Breast cancer 12/2009    left, chemo and radiation    Past Surgical History  Procedure Laterality Date  . Colonoscopy  2010    Dr. WAllen Norris normal  . Portacath placement  2011  . Port-a-cath removal  2013  . Breast surgery Left 2011    wide excision  . Breast biopsy Left 2011  . Breast cyst aspiration Left 1985  . Skin cancer  excision  1980    face  . Tubal ligation  1973  . Abdominal hysterectomy  1974  . Back surgery  1985    bone spurs between 5&6, used bone from left hip  . [facility]  . Femur fracture surgery Right March 2016  . Joint replacement Bilateral Jan 2016 and March 2016    hip    There were no vitals filed for this visit.  Visit Diagnosis:  Muscle weakness  Difficulty walking  Pain in right hip      Subjective Assessment - 08/20/14 1538    Subjective Waiting on "the stick" massage tool to use at home. She is now able to get out of the car with less difficulty,    Limitations Sitting;Walking;House hold activities   Pain Onset More than a month ago                         [facility] 08[facility]14 1608    Exercises   Exercises Other Exercises   Other Exercises  sitting on treatment table: AAROM right knee flexion/extension with ball and assistance of therapist through full ROM 0-115 degrees, isometric knee flexion with assistance multiple angles x 3 sets with 5 second holds, knee extension with 2# weights on ankles x 15 reps, knee flexion with  resistive band (red) x 20 reps, isometric hip abduction with ER 3 x 5 reps with 3 second holds (manual resistance), AROM ER in sitting (touch heels together) sit to stand on from high table (~25") x 10 reps with guidance of therapist for proper weight shift with ball betweeen knees,  NuStep (unbilled time) x up to 10 min. level #3 workload with verbal cuing to position LE's without excessive IR at hips and to press heels into pedals to simulate walking    Manual Therapy   Manual Therapy Soft tissue mobilization   Soft tissue mobilization with patient seated on treatemnt table: STM performed superficial techniques to lateral aspect of thigh and along quadriceps attachment to attachment, patella mobilization performed with patella mobilizer x 2 sets     Patient response to treatment:  improved flexibility in soft tissue and increased AAROM in right knee by 5 degrees with pull at end range. Patient required verbal cuing and demonstration to perform exercises correctly with fatigue and increased hip soreness with all abduction/ER exercises                PT Long Term Goals - 07/30/14 1204    PT LONG TERM GOAL #1   Title Patient will demonstrate improved function with daily tasks involving right LE as indicated by LEFS score of 30/64 or better by 08/22/2014   Baseline 8/64 (severe self perceived disabiltiy)   Status New   PT LONG TERM GOAL #2   Title Patient will improve 10 MW score to 18 seconds or less indicating improved community ambulation and decrease fall risk by 08/22/2014   Baseline 36.8seconds   Status New   PT LONG TERM GOAL #3   Title Patient will be able to ambulate without assistive device for short household ambulation by 08/22/2014   Baseline unable to ambulate without rolling walker for any distance   Status New   PT LONG TERM GOAL #4   Title Patient will improve LEFS to 40/64 or better by 09/11/2014 demonstrating improved function with daily tasks involving LE   Baseline 8/64   Status New   PT LONG TERM GOAL #5   Title Patient will improve 10MW to 15 seconds or less demonstrating improved community ambulation and decreased for falls by 09/11/2014   Baseline 10MW 36.8 seconds   Status New   Additional Long Term Goals   Additional Long Term Goals Yes   PT LONG TERM GOAL #6   Title Patient will be independent with home program without verbal cuing and be able to self manage symptoms and exercises by 09/11/2014   Baseline Patient has limited knowledg of appropriate exercises and progression to improve function and self manage symptoms at home   Status New               Plan - 08/20/14 1630    Clinical Impression Statement Patient demonstrated improvement with ability to walk with less difficulty and improved cadence with rollator. She is  improving ability to stand from chair with less difficulty. She continues with spasms and pain in right hip and thigh and will benefit from continued physical therapy intervnetion for pain control and to improve function.    Pt will benefit from skilled therapeutic intervention in order to improve on the following deficits Difficulty walking;Decreased endurance;Pain;Decreased strength   Rehab Potential Good   PT Frequency 2x / week   PT Duration 6 weeks   PT Treatment/Interventions Patient/family education;Therapeutic exercise;Manual techniques;Moist Heat;Cryotherapy   PT Next Visit  Plan manual therapy to help with spasms, and pain, therapeutic exercise for strength and endurance, re assess LEFS and 10MW        Problem List Patient Active Problem List   Diagnosis Date Noted  . Skin lesion of chest wall 07/17/2014  . Breast cancer 03/07/2013  . Personal history of malignant neoplasm of large intestine   . 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 Cancer 12/24/2009  . ANXIETY 06/11/2008  . CONSTIPATION, CHRONIC 06/11/2008  . FIBROMYALGIA 06/11/2008    Jomarie Longs PT 08/20/2014, 11:19 PM  [facility] [address] [place], [address] Phone: [phone]   Fax:  [phone]

## 2014-08-22 ENCOUNTER — Ambulatory Visit: Payer: Medicare Other | Admitting: Physical Therapy

## 2014-08-25 ENCOUNTER — Ambulatory Visit: Payer: Medicare Other | Attending: Student | Admitting: Physical Therapy

## 2014-08-25 ENCOUNTER — Encounter: Payer: Self-pay | Admitting: Physical Therapy

## 2014-08-25 DIAGNOSIS — M79604 Pain in right leg: Secondary | ICD-10-CM | POA: Diagnosis not present

## 2014-08-25 DIAGNOSIS — M6281 Muscle weakness (generalized): Secondary | ICD-10-CM | POA: Diagnosis not present

## 2014-08-25 DIAGNOSIS — M25551 Pain in right hip: Secondary | ICD-10-CM

## 2014-08-25 DIAGNOSIS — R262 Difficulty in walking, not elsewhere classified: Secondary | ICD-10-CM | POA: Insufficient documentation

## 2014-08-26 NOTE — Therapy (Signed)
[facility] [address] [place], [address] Phone: [phone]   Fax:  [phone]  Physical Therapy Treatment  Patient Details  Name: Terri Perez MRN: [id number] Date of Birth: [birth year] Referring Provider:  Vela Prose, MD  Encounter Date: 08/25/2014      PT End of Session - 08/25/14 1202    Visit Number 8   Number of Visits 12   Date for PT Re-Evaluation 09/11/14   Authorization Type 8   Authorization Time Period 10   PT Start Time 1103   PT Stop Time 1157   PT Time Calculation (min) 54 min   Activity Tolerance Patient tolerated treatment well   Behavior During Therapy [facility] for tasks assessed/performed      Past Medical History  Diagnosis Date  . Personal history of fibromyalgia 2002  . Unspecified constipation   . H/O cystitis 2011  . Special screening for malignant neoplasms, colon   . Arm fracture     right forearm, d/t fall  . Personal history of malignant neoplasm of large intestine   . Bowel trouble 2010  . Cancer 12/2009    Left UOQ breast tumor; wide excision,sn bx and whole breast radiation. Chemotherapy done. There was only a 41m area of residual tumor remaining. All sn were negative. This was ER-positive, PR-negative, HER-2 neu not over expressed tumor. The original ultrasound size was 2.6 cm(T2).  . 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 On wide excision 3 mm area of residual tumor. Sentinel node negative. ER-30%, PR-negative, HER-2 not overexpressing.  . Breast cancer 12/2009    left, chemo and radiation    Past Surgical History  Procedure Laterality Date  . Colonoscopy  2010    Dr. WAllen Norris normal  . Portacath placement  2011  . Port-a-cath removal  2013  . Breast surgery Left 2011    wide excision  . Breast biopsy Left 2011  . Breast cyst aspiration Left 1985  . Skin cancer  excision  1980    face  . Tubal ligation  1973  . Abdominal hysterectomy  1974  . Back surgery  1985    bone spurs between 5&6, used bone from left hip  . [facility]  . Femur fracture surgery Right March 2016  . Joint replacement Bilateral Jan 2016 and March 2016    hip    There were no vitals filed for this visit.  Visit Diagnosis:  Muscle weakness  Difficulty walking  Pain in right hip      Subjective Assessment - 08/25/14 1105    Subjective Patient reports she was very sore and had pain in right groin/hip after doing too much at home with bike on floor (she rpeorts performing 200 revolutions and that seemed to aggravate her pain). She is not having much soreness today. She is using "the stick" at home to assist with soft tissue mobilization in left thigh.    Limitations Sitting;Walking;House hold activities   Patient Stated Goals She wants to get her "right leg in motion"   Currently in Pain? Yes   Pain Score 5    Pain Location Other (Comment)  right thigh anterior aspect   Pain Orientation Right   Pain Descriptors / Indicators Aching   Pain Type Other (Comment)  s/p femur fracture and surgery   Pain Onset More  than a month ago   Pain Frequency Intermittent   Aggravating Factors  prolonged walking, sitting   Pain Relieving Factors movement, walking   Multiple Pain Sites No     Objective: Gait: ambulating with rollator with improved rotation of right LE with decreased toe in (IR at hip), improved cadence noted, improved hip extension and push off with right LE Palpation: + spasms along right quadriceps muscle, more distal region just above patella, AAROM right knee flexion pre treatment 0-115 degrees Strength: decreased right hip flexion, abduction and ER as compared to left        [facility] Adult PT Treatment/Exercise - 08/25/14 1159    Exercises   Exercises Other Exercises   Other Exercises  sitting on treatment table: AAROM right knee  flexion/extension with assistance of therapist through full ROM 0-120 degrees, isometric knee flexion with assistance multiple angles x 3 sets with 5 second holds, hip extension with manual resistance given x 5 reps with verbal cuing, sit to stand on from high table (~25") 2 x 5 reps with guidance of therapist for proper weight shift with ball betweeen knees and standing on airex balance pad, knee extension 3# on ankles 2 x 15 reps alternating with knee flexion with resistive band x 15 reps, manual resistive ER with abduction in sitting (clam) 2 x 5 reps, endurance exercises performed with UE's 5 reps each of 3 positions: horizontal abduction, adduction and shoulder rotations,    NuStep x10 min (unbilled) level #4 workload with verbal cuing to position LE's without excessive IR at hips and to press heels into pedals to simulate walking (113 steps)   Manual Therapy   Manual Therapy Soft tissue mobilization   Soft tissue mobilization with patient seated on treatemnt table: STM performed superficial techniques to lateral aspect of thigh and along quadriceps attachment to attachment with primary focos on area just proximal to patella today, patella mobilization performed with patella mobilizer x 2 sets     Patient response to treatment: decreased spasms and improved soft tissue elasticity along quadriceps muscle and patella mobility improved as well, improved cadence, gait pattern and gait speed as demonstrated by 10 MW of 16.8 seconds (previous assessment was 22 seconds (08/04/14), improving endurance as well.            PT Education - 08/25/14 1200    Education provided Yes   Education Details Re assessed exercises to be performed at home within patient pain tolerance right hip region   Person(s) Educated Patient;Spouse   Methods Explanation;Verbal cues   Comprehension Verbalized understanding             PT Long Term Goals - 08/25/14 [place] #1   Title Patient will  demonstrate improved function with daily tasks involving right LE as indicated by LEFS score of 30/64 or better by 08/22/2014   Baseline 8/64 (severe self perceived disabiltiy)   Status Deferred to next session   PT LONG TERM GOAL #2   Title Patient will improve 10 MW score to 18 seconds or less indicating improved community ambulation and decrease fall risk by 08/22/2014   Baseline 36.8seconds   Status Achieved (16.8 seconds)    PT LONG TERM GOAL #3   Title Patient will be able to ambulate without assistive device for short household ambulation by 08/22/2014   Baseline unable to ambulate without rolling walker for any distance   Status Partially Met   PT LONG TERM GOAL #4  Title Patient will improve LEFS to 40/64 or better by 09/11/2014 demonstrating improved function with daily tasks involving LE   Baseline 8/64   Status On-going   PT LONG TERM GOAL #5   Title Patient will improve 10MW to 15 seconds or less demonstrating improved community ambulation and decreased for falls by 09/11/2014   Baseline 10MW 36.8 seconds   Status On-going   PT LONG TERM GOAL #6   Title Patient will be independent with home program without verbal cuing and be able to self manage symptoms and exercises by 09/11/2014   Baseline Patient has limited knowledg of appropriate exercises and progression to improve function and self manage symptoms at home   Status On-going               Plan - 08/25/14 1203    Clinical Impression Statement Patient demonstrated improvement with soft tissue elasticity/mobility and improved ability to perform exercises with less assistance and verbal cuing. She continues with difficulty with sit to stand and prolonged walking due to weakness as she continues to heal from injury/surgery. She continues to require assistanct to complete all exercises and will benefit from additional  Physical therapy intervention to achieve goals.    Pt will benefit from skilled therapeutic intervention  in order to improve on the following deficits Difficulty walking;Decreased endurance;Pain;Decreased strength   Rehab Potential Good   PT Frequency 2x / week   PT Duration 6 weeks   PT Treatment/Interventions Patient/family education;Therapeutic exercise;Manual techniques;Moist Heat;Cryotherapy   PT Next Visit Plan manual therapy to help with spasms, and pain, therapeutic exercise for strength and endurance        Problem List Patient Active Problem List   Diagnosis Date Noted  . Skin lesion of chest wall 07/17/2014  . Breast cancer 03/07/2013  . Personal history of malignant neoplasm of large intestine   . 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 Cancer 12/24/2009  . ANXIETY 06/11/2008  . CONSTIPATION, CHRONIC 06/11/2008  . FIBROMYALGIA 06/11/2008    Jomarie Longs PT 08/26/2014, 1:42 PM  [facility] [address] [place], [address] Phone: [phone]   Fax:  [phone]

## 2014-08-27 ENCOUNTER — Encounter: Payer: Self-pay | Admitting: Physical Therapy

## 2014-08-27 ENCOUNTER — Ambulatory Visit: Payer: Medicare Other | Admitting: Physical Therapy

## 2014-08-27 DIAGNOSIS — M6281 Muscle weakness (generalized): Secondary | ICD-10-CM

## 2014-08-27 DIAGNOSIS — M79604 Pain in right leg: Secondary | ICD-10-CM | POA: Diagnosis not present

## 2014-08-27 DIAGNOSIS — M25551 Pain in right hip: Secondary | ICD-10-CM

## 2014-08-27 DIAGNOSIS — R262 Difficulty in walking, not elsewhere classified: Secondary | ICD-10-CM

## 2014-08-28 NOTE — Therapy (Signed)
[facility] [address], [place], [address] Phone: [phone]   Fax:  [phone]  Physical Therapy Treatment  Patient Details  Name: Terri Perez MRN: [id number] Date of Birth: 08-03-1945 Referring Provider:  Vela Prose, MD  Encounter Date: 08/27/2014      PT End of Session - 08/27/14 1205    Visit Number 9   Number of Visits 12   Date for PT Re-Evaluation 09/11/14   Authorization Type 9   Authorization Time Period 10   PT Start Time 1107   PT Stop Time 1200   PT Time Calculation (min) 53 min   Activity Tolerance Patient tolerated treatment well   Behavior During Therapy [facility] for tasks assessed/performed      Past Medical History  Diagnosis Date  . Personal history of fibromyalgia 2002  . Unspecified constipation   . H/O cystitis 2011  . Special screening for malignant neoplasms, colon   . Arm fracture     right forearm, d/t fall  . Personal history of malignant neoplasm of large intestine   . Bowel trouble 2010  . Cancer 12/2009    Left UOQ breast tumor; wide excision,sn bx and whole breast radiation. Chemotherapy done. There was only a 9m area of residual tumor remaining. All sn were negative. This was ER-positive, PR-negative, HER-2 neu not over expressed tumor. The original ultrasound size was 2.6 cm(T2).  . 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 On wide excision 3 mm area of residual tumor. Sentinel node negative. ER-30%, PR-negative, HER-2 not overexpressing.  . Breast cancer 12/2009    left, chemo and radiation    Past Surgical History  Procedure Laterality Date  . Colonoscopy  2010    Dr. WAllen Norris normal  . Portacath placement  2011  . Port-a-cath removal  2013  . Breast surgery Left 2011    wide excision  . Breast biopsy Left 2011  . Breast cyst aspiration Left 1985  . Skin cancer  excision  1980    face  . Tubal ligation  1973  . Abdominal hysterectomy  1974  . Back surgery  1985    bone spurs between 5&6, used bone from left hip  . [facility]  . Femur fracture surgery Right March 2016  . Joint replacement Bilateral Jan 2016 and March 2016    hip    There were no vitals filed for this visit.  Visit Diagnosis:  Muscle weakness  Difficulty walking  Pain in right hip      Subjective Assessment - 08/27/14 1109    Subjective Patient reports she is walking more at home and able to stand longer. She still fatigues in the morning with brushing teeth and getting ready with hair etc. She did go out to eat the other day and did okay, tiered afterwards. She is working on UE endurance exercises so she can wash and care for her hair at home.    Currently in Pain? Yes   Pain Score 3    Pain Location Other (Comment)  stiff and sore along outer thigh right LE   Pain Orientation Right   Pain Descriptors / Indicators Aching;Sore   Pain Type Other (Comment)  s/p femur fracture/surgery   Pain Onset More than a month ago   Pain Frequency Intermittent      Objective:  Gait: ambulating with rollator into clinic independently with normal cadence, decreased weight shift to right and mild toe in/IR at right hip AROM: decreased right hip external rotation, decreased ER passively at well (~25% limited); this is improving weekly Strength: right LE decreased hip flexion, ER, abduction and extension, decreased knee extension/flexion as compared to left LE Palpation: increased spasms/tenderness and decreased soft tissue elasticity along quadriceps, lateral thigh region right LE        [facility] Adult PT Treatment/Exercise - 08/27/14 1208    Exercises   Exercises Other Exercises   Other Exercises  sitting on treatment table: AAROM right knee flexion/extension with assistance of therapist through full ROM 0-115 degrees, isometric knee flexion with  assistance multiple angles x 3 sets with 5 second holds, hip extension with manual resistance given x 5 reps with verbal cuing, 3# weight on ankle for knee extension x 15 reps, resisted knee flexion with green resistive band x 20 reps, sit to stand on from high table (~25") x 10 reps with guidance of therapist and contact guard for safety for proper weight shift with ball betweeen knees and use of balance pad, standing side stepping along counter 3 sets, step ups onto 4" step forward leading with each LE x 5 reps each, lateral step ups x 5 reps (with contact guard for safety), standing on airex balance pad performed weight shifting forward and back and side to side x 1 min. Each with contact guard of therapist. UE endurance exercises with shoulders positioned close to 90 abduction: 5-10 reps back, forward and rotations   NuStep x10 min. level #3 workload with verbal cuing to position LE's without excessive IR at hips and to press heels into pedals to simulate walking    Manual Therapy   Manual Therapy Soft tissue mobilization   Soft tissue mobilization with patient seated on treatemnt table: STM performed superficial techniques to lateral aspect of thigh and along quadriceps attachment to attachment, patella mobilization performed with patella mobilizer x 2 sets      Patient response to treatment: improved balance/weight shifting with decreased fear of falling with repetition, improved strength with increased intensity of exercises, improved gait pattern with increased cadence and decreased use of rollator for UE support during walking          PT Education - 08/27/14 1150    Education provided Yes   Education Details Instructed patient in step ups and lateral step ups and balance with weight shifting on balance pad: to add weight shifting to home program   Person(s) Educated Patient   Methods Explanation;Demonstration;Verbal cues   Comprehension Verbalized understanding;Returned  demonstration;Verbal cues required             PT Long Term Goals - 08/25/14 1203    PT LONG TERM GOAL #1   Title Patient will demonstrate improved function with daily tasks involving right LE as indicated by LEFS score of 30/64 or better by 08/22/2014   Baseline 8/64 (severe self perceived disabiltiy)   Status Deferred   PT LONG TERM GOAL #2   Title Patient will improve 10 MW score to 18 seconds or less indicating improved community ambulation and decrease fall risk by 08/22/2014   Baseline 36.8seconds   Status Achieved   PT LONG TERM GOAL #3   Title Patient will be able to ambulate without assistive device for short household ambulation by 08/22/2014   Baseline unable to ambulate without rolling walker for any distance   Status Partially Met  PT LONG TERM GOAL #4   Title Patient will improve LEFS to 40/64 or better by 09/11/2014 demonstrating improved function with daily tasks involving LE   Baseline 8/64   Status On-going   PT LONG TERM GOAL #5   Title Patient will improve 10MW to 15 seconds or less demonstrating improved community ambulation and decreased for falls by 09/11/2014   Baseline 10MW 36.8 seconds   Status On-going   PT LONG TERM GOAL #6   Title Patient will be independent with home program without verbal cuing and be able to self manage symptoms and exercises by 09/11/2014   Baseline Patient has limited knowledg of appropriate exercises and progression to improve function and self manage symptoms at home   Status On-going               Plan - 08/27/14 1205    Clinical Impression Statement Patient demonstrated improved endurance today with standing exercises. She was able to complete 15 min. without resting with moderate fatigue noted. She is able to perform most exercises with minimal  verbal cuing and assistance and is increasing intensity of exercises with increased resistance for LE exercise. She conitnues with spasms and weakness in right hip/LE and will  require additional physical therapy intervention to address this in order for her to transition safely to independent home program .    Pt will benefit from skilled therapeutic intervention in order to improve on the following deficits Difficulty walking;Decreased endurance;Pain;Decreased strength   Rehab Potential Good   PT Frequency 2x / week   PT Duration 6 weeks   PT Treatment/Interventions Patient/family education;Therapeutic exercise;Manual techniques;Moist Heat;Cryotherapy        Problem List Patient Active Problem List   Diagnosis Date Noted  . Skin lesion of chest wall 07/17/2014  . Breast cancer 03/07/2013  . Personal history of malignant neoplasm of large intestine   . 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 Cancer 12/24/2009  . ANXIETY 06/11/2008  . CONSTIPATION, CHRONIC 06/11/2008  . FIBROMYALGIA 06/11/2008    Jomarie Longs PT 08/28/2014, 7:58 AM  [facility] [place] [facility] [address], [place], [address] Phone: [phone]   Fax:  [phone]

## 2014-09-01 ENCOUNTER — Encounter: Payer: Self-pay | Admitting: Physical Therapy

## 2014-09-01 ENCOUNTER — Ambulatory Visit: Payer: Medicare Other | Admitting: Physical Therapy

## 2014-09-01 DIAGNOSIS — R262 Difficulty in walking, not elsewhere classified: Secondary | ICD-10-CM | POA: Diagnosis not present

## 2014-09-01 DIAGNOSIS — M79604 Pain in right leg: Secondary | ICD-10-CM

## 2014-09-01 DIAGNOSIS — M6281 Muscle weakness (generalized): Secondary | ICD-10-CM | POA: Diagnosis not present

## 2014-09-01 DIAGNOSIS — M25551 Pain in right hip: Secondary | ICD-10-CM | POA: Diagnosis not present

## 2014-09-02 NOTE — Therapy (Signed)
[facility] [address], [place], [address] Phone: [phone]   Fax:  [phone]  Physical Therapy Treatment  Patient Details  Name: Terri Perez MRN: [id number] Date of Birth: 1945/05/05 Referring Provider:  Vela Prose, MD  Encounter Date: 09/01/2014      PT End of Session - 09/01/14 1213    Visit Number 10   Number of Visits 12   Date for PT Re-Evaluation 09/11/14   Authorization Type 10   Authorization Time Period 10   PT Start Time 1105   PT Stop Time 1200   PT Time Calculation (min) 55 min   Activity Tolerance Patient tolerated treatment well   Behavior During Therapy [facility] for tasks assessed/performed      Past Medical History  Diagnosis Date  . Personal history of fibromyalgia 2002  . Unspecified constipation   . H/O cystitis 2011  . Special screening for malignant neoplasms, colon   . Arm fracture     right forearm, d/t fall  . Personal history of malignant neoplasm of large intestine   . Bowel trouble 2010  . Cancer 12/2009    Left UOQ breast tumor; wide excision,sn bx and whole breast radiation. Chemotherapy done. There was only a 52m area of residual tumor remaining. All sn were negative. This was ER-positive, PR-negative, HER-2 neu not over expressed tumor. The original ultrasound size was 2.6 cm(T2).  . 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 On wide excision 3 mm area of residual tumor. Sentinel node negative. ER-30%, PR-negative, HER-2 not overexpressing.  . Breast cancer 12/2009    left, chemo and radiation    Past Surgical History  Procedure Laterality Date  . Colonoscopy  2010    Dr. WAllen Norris normal  . Portacath placement  2011  . Port-a-cath removal  2013  . Breast surgery Left 2011    wide excision  . Breast biopsy Left 2011  . Breast cyst aspiration Left 1985  . Skin  cancer excision  1980    face  . Tubal ligation  1973  . Abdominal hysterectomy  1974  . Back surgery  1985    bone spurs between 5&6, used bone from left hip  . [facility]  . Femur fracture surgery Right March 2016  . Joint replacement Bilateral Jan 2016 and March 2016    hip    There were no vitals filed for this visit.  Visit Diagnosis:  Difficulty walking  Muscle weakness of lower extremity  Pain of right lower extremity      Subjective Assessment - 09/01/14 1106    Subjective Patient reports she is walking more at home and able to stand longer. She still fatigues in the morning with brushing teeth and getting ready with hair etc. with legs fatiguing.  She is getting out in the community more with eating and getting out in general.     Limitations Sitting;Walking;House hold activities;Other (comment)  limited in community activities/shopping   Currently in Pain? Yes   Pain Score 3    Pain Location Other (Comment)  groin, side and back of hip   Pain Orientation Right   Pain Descriptors / Indicators Aching;Sore   Pain Type Other (Comment)  s/p right femur fracture with THR   Pain Onset More than a month ago      Objective:  Gait: ambulating with rollator independently with improved gait pattern of increased hip extension, push off right LE and increased cadence, She demonstrates increased hip hiking on right  10MW test 15 seconds (improved from 17 seconds)  Palpation: increased spasms and tenderness over right QL, lumbar paraspinals, right gluteal muscles (gluteus medius), quadriceps muscles      [facility] Adult PT Treatment/Exercise - 09/01/14 1111    Exercises   Exercises Other Exercises   Other Exercises  sitting on treatment table: AAROM right knee flexion/extension with ball and assistance of therapist through full ROM 0-115 degrees, isometric knee flexion with assistance multiple angles x 3 sets with 5 second holds,NuStep x10 min. level  #4 workload with verbal cuing to position LE's without excessive IR at hips and to press heels into pedals to simulate walking    Manual Therapy   Manual Therapy Soft tissue mobilization   Soft tissue mobilization with patient seated on treatemnt table: STM performed superficial and deep techniques to lower back right side including QL and gluteal muscles, lateral aspect of thigh and along quadriceps attachment to attachment, patella mobilization performed with patella mobilizer x 2 sets      Patient response to treatment: decreased pain in lower back/into right hip and decreased pain on walking with treatment, limited exercise due to stiffness and pain in right hip/lower back today          PT Education - 09/01/14 1119    Education provided Yes   Education Details Explained gait pattern and hiking of right hip that is contributing to lower back stiffness and muscle spasms and with performing release of these muscles she will be able to walk with less difficulty   Person(s) Educated Patient   Methods Explanation;Demonstration   Comprehension Verbalized understanding             PT Long Term Goals - 09/01/14 1200    PT LONG TERM GOAL #1   Title Patient will demonstrate improved function with daily tasks involving right LE as indicated by LEFS score of 30/64 or better by 08/22/2014   Baseline 8/64 (severe self perceived disabiltiy)   Status Deferred   PT LONG TERM GOAL #2   Title Patient will improve 10 MW score to 18 seconds or less indicating improved community ambulation and decrease fall risk by 08/22/2014   Baseline 36.8seconds   Status Achieved   PT LONG TERM GOAL #3   Title Patient will be able to ambulate without assistive device for short household ambulation by 08/22/2014   Baseline unable to ambulate without rolling walker for any distance   Status Partially Met   PT LONG TERM GOAL #4   Title Patient will improve LEFS to 40/64 or better by 09/11/2014 demonstrating  improved function with daily tasks involving LE   Baseline 8/64   Status On-going   PT LONG TERM GOAL #5   Title Patient will improve 10MW to 15 seconds or less demonstrating improved community ambulation and decreased for falls by 09/11/2014   Baseline 10MW 36.8 seconds   Status Achieved   PT LONG TERM GOAL #6   Title Patient will be independent with home program without verbal cuing and be able to self manage symptoms and exercises by 09/11/2014   Baseline Patient has limited knowledg of appropriate exercises and progression to improve function and self manage symptoms at home   Status On-going               Plan - 09/01/14 1214  Clinical Impression Statement Patient is progressing well with improving gait speed, pattern and ROM and strength in right LE. She conitnues with increased risk for falling due to decreased gait speed (15 sec.  with 10MW), decresaed strength in right hip/LE.    Pt will benefit from skilled therapeutic intervention in order to improve on the following deficits Difficulty walking;Decreased endurance;Pain;Decreased strength   Rehab Potential Good   Clinical Impairments Affecting Rehab Potential (+) motivated, family support   (-) multiple comorbidities: history of CA, colon resection, THR and subsequent fall with femur fracture and THR revision    PT Frequency 2x / week   PT Duration 8 weeks   PT Treatment/Interventions Patient/family education;Therapeutic exercise;Manual techniques;Moist Heat;Cryotherapy   PT Next Visit Plan manual therapy to help with spasms, and pain, therapeutic exercise for strength and endurance          G-Codes - 2014/09/02 1216    Functional Assessment Tool Used LEFS, 10MW, clinical judgment, pain scale, strength deficits, ROM deficits   Functional Limitation Mobility: Walking and moving around   Mobility: Walking and Moving Around Current Status [phone]) At least 40 percent but less than 60 percent impaired, limited or restricted    Mobility: Walking and Moving Around Goal Status [phone]) At least 20 percent but less than 40 percent impaired, limited or restricted      Problem List Patient Active Problem List   Diagnosis Date Noted  . Skin lesion of chest wall 07/17/2014  . Breast cancer 03/07/2013  . Personal history of malignant neoplasm of large intestine   . 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 Cancer 12/24/2009  . ANXIETY 06/11/2008  . CONSTIPATION, CHRONIC 06/11/2008  . FIBROMYALGIA 06/11/2008    Jomarie Longs PT 09/02/2014, 1:22 PM  [facility] [facility] [address] [place], [address] Phone: [phone]   Fax:  [phone]

## 2014-09-03 ENCOUNTER — Ambulatory Visit: Payer: Medicare Other | Admitting: Physical Therapy

## 2014-09-03 DIAGNOSIS — M25551 Pain in right hip: Secondary | ICD-10-CM | POA: Diagnosis not present

## 2014-09-03 DIAGNOSIS — M6281 Muscle weakness (generalized): Secondary | ICD-10-CM | POA: Diagnosis not present

## 2014-09-03 DIAGNOSIS — R262 Difficulty in walking, not elsewhere classified: Secondary | ICD-10-CM

## 2014-09-03 DIAGNOSIS — M79604 Pain in right leg: Secondary | ICD-10-CM | POA: Diagnosis not present

## 2014-09-04 NOTE — Therapy (Signed)
[place] [facility] [address], [place], [address] Phone: [phone]   Fax:  [phone]  Physical Therapy Treatment  Patient Details  Name: Terri Perez MRN: [id number] Date of Birth: 1945/03/27 Referring Provider:  Vela Prose, MD  Encounter Date: 09/03/2014      PT End of Session - 09/03/14 1210    Visit Number 11   Number of Visits 12   Date for PT Re-Evaluation 09/11/14   Authorization Type 11   Authorization Time Period 20   PT Start Time 1118   PT Stop Time 1203   PT Time Calculation (min) 45 min   Activity Tolerance Patient tolerated treatment well   Behavior During Therapy [facility] for tasks assessed/performed      Past Medical History  Diagnosis Date  . Personal history of fibromyalgia 2002  . Unspecified constipation   . H/O cystitis 2011  . Special screening for malignant neoplasms, colon   . Arm fracture     right forearm, d/t fall  . Personal history of malignant neoplasm of large intestine   . Bowel trouble 2010  . Cancer 12/2009    Left UOQ breast tumor; wide excision,sn bx and whole breast radiation. Chemotherapy done. There was only a 28mm area of residual tumor remaining. All sn were negative. This was ER-positive, PR-negative, HER-2 neu not over expressed tumor. The original ultrasound size was 2.6 cm(T2).  . 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 On wide excision 3 mm area of residual tumor. Sentinel node negative. ER-30%, PR-negative, HER-2 not overexpressing.  . Breast cancer 12/2009    left, chemo and radiation    Past Surgical History  Procedure Laterality Date  . Colonoscopy  2010    Dr. Allen Norris, normal  . Portacath placement  2011  . Port-a-cath removal  2013  . Breast surgery Left 2011    wide excision  . Breast biopsy Left 2011  . Breast cyst aspiration Left 1985  . Skin  cancer excision  1980    face  . Tubal ligation  1973  . Abdominal hysterectomy  1974  . Back surgery  1985    bone spurs between 5&6, used bone from left hip  . Colon resection  Sept 2014    [facility]   . Femur fracture surgery Right March 2016  . Joint replacement Bilateral Jan 2016 and March 2016    hip    There were no vitals filed for this visit.  Visit Diagnosis:  Difficulty walking  Muscle weakness of lower extremity      Subjective Assessment - 09/03/14 1120    Subjective Patient reports she is walking more at home and able to stand longer. She still fatigues in the morning with prolonged standing for brushing teeth and getting ready with hair etc. with legs fatiguing.  She is getting out in the community more with eating and getting out in general; reports going out 4x last week.     Patient Stated Goals She wants to get her "right leg in motion"   Currently in Pain? Yes   Pain Score 5    Pain Location Hip   Pain Orientation Right   Pain Descriptors / Indicators Aching   Pain Type --  subacute, fractured femur/THR surgery   Pain Onset More than a month ago      Objective: Gait: ambulating with rollator independently and  able to take 3 steps to treatment table without walker today, decreased weight shift right and decreased hip/knee flexion right for swing through Palpation; + tenderness and decreased soft tissue mobility along quadriceps muscle right LE      [facility] Adult PT Treatment/Exercise - 09/03/14 2258    Exercises   Exercises Other Exercises   Other Exercises  sitting on treatment table: AAROM right knee flexion/extension with ball and assistance of therapist through full ROM 0-120 degrees, isometric knee flexion with assistance multiple angles x 3 sets with 5 second holds, hip flexion/extension with manual resistance and assistance given x 5 reps with verbal cuing, sit to stand on from high table (~22") 3 x 5  reps with guidance of therapist for proper weight  shift with ball betweeen knees, ambulated 100' with rollator in 1 minute without rests and performed 2x with close supervision of therapist for safety with verbal cuing to flex hip/knee to clear floor    Manual Therapy   Manual Therapy Soft tissue mobilization   Soft tissue mobilization with patient seated on treatemnt table: STM performed superficial and deep techniques  lateral aspect of thigh and along quadriceps attachment to attachment, patella mobilization performed with patella mobilizer x 2 sets     Patient response to treatment: demonstrated improved control/technique with exercises with verbal cuing and guidance. Improved endurance as noted with walking 100' without resting and improved gait pattern with swing through with improved hip and knee flexion with verbal cuing           PT Education - 09/03/14 1200    Education provided Yes   Education Details Instructed to continue with exercises as instructed and work on sit to stand from lower surface   Person(s) Educated Patient   Methods Explanation;Demonstration   Comprehension Verbalized understanding;Returned demonstration             PT Long Term Goals - 09/01/14 1200    PT LONG TERM GOAL #1   Title Patient will demonstrate improved function with daily tasks involving right LE as indicated by LEFS score of 30/64 or better by 08/22/2014   Baseline 8/64 (severe self perceived disabiltiy)   Status Deferred   PT LONG TERM GOAL #2   Title Patient will improve 10 MW score to 18 seconds or less indicating improved community ambulation and decrease fall risk by 08/22/2014   Baseline 36.8seconds   Status Achieved   PT LONG TERM GOAL #3   Title Patient will be able to ambulate without assistive device for short household ambulation by 08/22/2014   Baseline unable to ambulate without rolling walker for any distance   Status Partially Met   PT LONG TERM GOAL #4   Title Patient will improve LEFS to 40/64 or better by 09/11/2014  demonstrating improved function with daily tasks involving LE   Baseline 8/64   Status On-going   PT LONG TERM GOAL #5   Title Patient will improve 10MW to 15 seconds or less demonstrating improved community ambulation and decreased for falls by 09/11/2014   Baseline 10MW 36.8 seconds   Status Achieved   PT LONG TERM GOAL #6   Title Patient will be independent with home program without verbal cuing and be able to self manage symptoms and exercises by 09/11/2014   Baseline Patient has limited knowledg of appropriate exercises and progression to improve function and self manage symptoms at home   Status On-going  Plan - 09/03/14 1210    Clinical Impression Statement Patient is progressing with imrpoved strength as demonstrated with decreased height of treatment table from 25" to 21" for sit to stand. She is exercising at home as instructed and continues to require verbal cuing and guidance to complete exercises with good technqiue.She should continue to progress to walking with less supportive device as she heals and improved strength and endurance.     Pt will benefit from skilled therapeutic intervention in order to improve on the following deficits Difficulty walking;Decreased endurance;Pain;Decreased strength   Rehab Potential Good   PT Frequency 2x / week   PT Duration 8 weeks   PT Treatment/Interventions Patient/family education;Therapeutic exercise;Manual techniques;Moist Heat;Cryotherapy   PT Next Visit Plan manual therapy to help with spasms, and pain, therapeutic exercise for strength and endurance        Problem List Patient Active Problem List   Diagnosis Date Noted  . Skin lesion of chest wall 07/17/2014  . Breast cancer 03/07/2013  . Personal history of malignant neoplasm of large intestine   . 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 Cancer 12/24/2009  . ANXIETY 06/11/2008  . CONSTIPATION, CHRONIC 06/11/2008  . FIBROMYALGIA  06/11/2008    Jomarie Longs PT 09/04/2014, 2:56 PM  [place] [facility] [address], [place], [address] Phone: [phone]   Fax:  [phone]

## 2014-09-05 ENCOUNTER — Encounter: Payer: Medicare Other | Admitting: Physical Therapy

## 2014-09-10 ENCOUNTER — Ambulatory Visit: Payer: Medicare Other | Admitting: Physical Therapy

## 2014-09-10 ENCOUNTER — Encounter: Payer: Self-pay | Admitting: Physical Therapy

## 2014-09-10 DIAGNOSIS — M25551 Pain in right hip: Secondary | ICD-10-CM

## 2014-09-10 DIAGNOSIS — R262 Difficulty in walking, not elsewhere classified: Secondary | ICD-10-CM | POA: Diagnosis not present

## 2014-09-10 DIAGNOSIS — M79604 Pain in right leg: Secondary | ICD-10-CM | POA: Diagnosis not present

## 2014-09-10 DIAGNOSIS — M6281 Muscle weakness (generalized): Secondary | ICD-10-CM

## 2014-09-11 NOTE — Therapy (Signed)
[place] [facility] [address] [place], [address] Phone: [phone]   Fax:  [phone]  Physical Therapy Treatment  Patient Details  Name: Terri Perez MRN: [id number] Date of Birth: 09/17/1945 Referring Provider:  Vela Prose, MD  Encounter Date: 09/10/2014      PT End of Session - 09/10/14 1140    Visit Number 12   Number of Visits 28   Date for PT Re-Evaluation 11/06/14   Authorization Type 12   Authorization Time Period 20   PT Start Time 1032   PT Stop Time 1120   PT Time Calculation (min) 48 min   Activity Tolerance Patient tolerated treatment well   Behavior During Therapy [facility] for tasks assessed/performed      Past Medical History  Diagnosis Date  . Personal history of fibromyalgia 2002  . Unspecified constipation   . H/O cystitis 2011  . Special screening for malignant neoplasms, colon   . Arm fracture     right forearm, d/t fall  . Personal history of malignant neoplasm of large intestine   . Bowel trouble 2010  . Cancer 12/2009    Left UOQ breast tumor; wide excision,sn bx and whole breast radiation. Chemotherapy done. There was only a 62m area of residual tumor remaining. All sn were negative. This was ER-positive, PR-negative, HER-2 neu not over expressed tumor. The original ultrasound size was 2.6 cm(T2).  . 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 On wide excision 3 mm area of residual tumor. Sentinel node negative. ER-30%, PR-negative, HER-2 not overexpressing.  . Breast cancer 12/2009    left, chemo and radiation    Past Surgical History  Procedure Laterality Date  . Colonoscopy  2010    Dr. WAllen Norris normal  . Portacath placement  2011  . Port-a-cath removal  2013  . Breast surgery Left 2011    wide excision  . Breast biopsy Left 2011  . Breast cyst aspiration Left 1985  . Skin  cancer excision  1980    face  . Tubal ligation  1973  . Abdominal hysterectomy  1974  . Back surgery  1985    bone spurs between 5&6, used bone from left hip  . [facility]  . Femur fracture surgery Right March 2016  . Joint replacement Bilateral Jan 2016 and March 2016    hip    There were no vitals filed for this visit.  Visit Diagnosis:  Difficulty walking - Plan: PT plan of care cert/re-cert  Muscle weakness of lower extremity - Plan: PT plan of care cert/re-cert  Pain in right hip - Plan: PT plan of care cert/re-cert      Subjective Assessment - 09/10/14 1032    Subjective Patient reports she is having increased soreness in right hip and feels that increased intensity on NUStep (level #4 workload) last session contributed to increased discomfort and pain. She is getting up and moving more at home and is getting out in the community for eating out and getting out of the house. She is reporting stiffness in right thigh and hip today and feels better following therapy sessions.    Patient Stated Goals She wants to get her "right leg in motion"   Currently in Pain? Yes   Pain Score 5    Pain Location Hip   Pain Orientation Right  Pain Descriptors / Indicators Aching;Tightness   Pain Type Other (Comment)  s/p surgery for THR and Fx right femur   Pain Onset More than a month ago   Pain Frequency Intermittent   Aggravating Factors  prolonged walking, sitting, bike motion right hip   Multiple Pain Sites No       Objective: Gait: ambulating with rollator with slower cadence as compared to previous session; able to transfer to table or Nustep taking 1-2 steps without rollator Palpation; + spasms and decreased soft tissue elasticity along anterior and lateral thigh/quadriceps muscles AAROM: right hip with decreased ER and abduction as compared to left hip (50%) Strength: decreased right hip ER 3/5 (within available range), abduction 3/5, knee  extension 4/5, flexion 4-/5       [facility] Adult PT Treatment/Exercise - 09/10/14 1405    Exercises   Exercises Other Exercises   Other Exercises  sitting on treatment table: AAROM right knee flexion/extension with ball and assistance of therapist through full ROM 0-120 degrees, isometric knee flexion with assistance multiple angles x 3 sets with 5 second holds,  NuStep (unbilled) x5 min. level #2 workload with verbal cuing to position LE's without excessive IR at hips and to press heels into pedals to simulate walking    Manual Therapy   Manual Therapy Soft tissue mobilization   Soft tissue mobilization with patient seated on treatemnt table: STM performed superficial and deep techniques to lateral aspect of thigh and along quadriceps attachment to attachment, patella mobilization performed with patella mobilizer x 2 sets     Patient response to treatment: improved soft tissue elasticity in right thigh/quadriceps muscles and improved ability to ambulate with improved cadence following session with decreased stiffness and pain in right hip/thigh by 50%, patient requried guidance and verbal cuing for exercises in order to perform with good alignment of hip/knee without IR at hip           PT Education - 09/10/14 1125    Education provided Yes   Education Details Discussed progress and healing process and time for patient to regain strength based on history and trauma suffered to right hip/femur   Person(s) Educated Patient   Methods Explanation   Comprehension Verbalized understanding             PT Long Term Goals - 09/10/14 1130    PT LONG TERM GOAL #1   Title Patient will demonstrate improved function with daily tasks involving right LE as indicated by LEFS score of 30/64 or better by 10/17/2014   Baseline 8/64 (severe self perceived disabiltiy) current 19/64   Status Revised   PT LONG TERM GOAL #2   Title Patient will improve 10 MW score to 13 seconds or less indicating  improved community ambulation and decrease fall risk by 10/17/2014   Baseline 16 seconds   Status Revised   PT LONG TERM GOAL #3   Title Patient will be able to ambulate without assistive device for short household ambulation and appropriate assistive device outside the home for more independence in the community by 11/06/2014   Baseline Patient is ambulating within one room a few steps without assistive device   Status Partially Met   PT LONG TERM GOAL #4   Title Patient will improve LEFS to 40/64 or better by 09/11/2014 demonstrating improved function with daily tasks involving LE   Baseline 8/64   Status Not Met   PT LONG TERM GOAL #5   Title Patient will improve 10MW to 10  seconds or less demonstrating improved community ambulation and decreased for falls by 11/06/2014   Baseline 10MW = 16 seconds   Status Revised   PT LONG TERM GOAL #6   Title Patient will be independent with home program without verbal cuing and be able to self manage symptoms and exercises by 11/06/2014   Baseline Patient has limited knowledg of appropriate exercises and progression to improve function and self manage symptoms at home   Status On-going               Plan - 09/10/14 1130    Clinical Impression Statement Patient demonstrates improving ROM in right knee and decresaed soft tissue elasticity in quadriceps. She continues with significant weakness in right LE and pain in right hip s/p fall with THR and ORIF for fractured femur. She will require additional physical therapy intervention to improve function with walking without assistive device, decreased pain, improve ROM in order to return to prior level of function.    Pt will benefit from skilled therapeutic intervention in order to improve on the following deficits Difficulty walking;Decreased endurance;Pain;Decreased strength   Rehab Potential Good   Clinical Impairments Affecting Rehab Potential (+) motivated, family support   (-) multiple  comorbidities: history of CA, colon resection, THR and subsequent fall with femur fracture and THR revision    PT Frequency 2x / week   PT Duration 8 weeks   PT Treatment/Interventions Patient/family education;Therapeutic exercise;Manual techniques;Moist Heat;Cryotherapy   PT Next Visit Plan manual therapy to help with spasms, and pain, therapeutic exercise for strength and endurance        Problem List Patient Active Problem List   Diagnosis Date Noted  . Skin lesion of chest wall 07/17/2014  . Breast cancer 03/07/2013  . Personal history of malignant neoplasm of large intestine   . 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 Cancer 12/24/2009  . ANXIETY 06/11/2008  . CONSTIPATION, CHRONIC 06/11/2008  . FIBROMYALGIA 06/11/2008    Jomarie Longs PT 09/11/2014, 3:23 PM  [place] [facility] [address], [place], [address] Phone: [phone]   Fax:  [phone]

## 2014-09-12 ENCOUNTER — Encounter: Payer: Self-pay | Admitting: Physical Therapy

## 2014-09-12 ENCOUNTER — Ambulatory Visit: Payer: Medicare Other | Admitting: Physical Therapy

## 2014-09-12 DIAGNOSIS — M79604 Pain in right leg: Secondary | ICD-10-CM | POA: Diagnosis not present

## 2014-09-12 DIAGNOSIS — M6281 Muscle weakness (generalized): Secondary | ICD-10-CM | POA: Diagnosis not present

## 2014-09-12 DIAGNOSIS — M25551 Pain in right hip: Secondary | ICD-10-CM

## 2014-09-12 DIAGNOSIS — R262 Difficulty in walking, not elsewhere classified: Secondary | ICD-10-CM

## 2014-09-12 NOTE — Therapy (Signed)
[facility] [address] [place], [address] Phone: [phone]   Fax:  [phone]  Physical Therapy Treatment  Patient Details  Name: Terri Perez MRN: [id number] Date of Birth: 1945-09-30 Referring Provider:  Vela Prose, MD  Encounter Date: 09/12/2014      PT End of Session - 09/12/14 1053    Visit Number 13   Number of Visits 28   Date for PT Re-Evaluation 11/06/14   Authorization Type 13   Authorization Time Period 20   PT Start Time 1001   PT Stop Time 1052   PT Time Calculation (min) 51 min   Activity Tolerance Patient tolerated treatment well   Behavior During Therapy [facility] for tasks assessed/performed      Past Medical History  Diagnosis Date  . Personal history of fibromyalgia 2002  . Unspecified constipation   . H/O cystitis 2011  . Special screening for malignant neoplasms, colon   . Arm fracture     right forearm, d/t fall  . Personal history of malignant neoplasm of large intestine   . Bowel trouble 2010  . Cancer 12/2009    Left UOQ breast tumor; wide excision,sn bx and whole breast radiation. Chemotherapy done. There was only a 37m area of residual tumor remaining. All sn were negative. This was ER-positive, PR-negative, HER-2 neu not over expressed tumor. The original ultrasound size was 2.6 cm(T2).  . 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 On wide excision 3 mm area of residual tumor. Sentinel node negative. ER-30%, PR-negative, HER-2 not overexpressing.  . Breast cancer 12/2009    left, chemo and radiation    Past Surgical History  Procedure Laterality Date  . Colonoscopy  2010    Dr. WAllen Norris normal  . Portacath placement  2011  . Port-a-cath removal  2013  . Breast surgery Left 2011    wide excision  . Breast biopsy Left 2011  . Breast cyst aspiration Left 1985  . Skin  cancer excision  1980    face  . Tubal ligation  1973  . Abdominal hysterectomy  1974  . Back surgery  1985    bone spurs between 5&6, used bone from left hip  . [facility]  . Femur fracture surgery Right March 2016  . Joint replacement Bilateral Jan 2016 and March 2016    hip    There were no vitals filed for this visit.  Visit Diagnosis:  Difficulty walking  Muscle weakness of lower extremity  Pain in right hip      Subjective Assessment - 09/12/14 1005    Subjective Patient reports she is having less soreness in right hip and feels that lessening intensity on NUStep (last session helped with alleviating some of the discomfort and pain. She is getting up and moving more at home and is getting out in the community for eating out and getting out of the house. She is reporting stiffness in right thigh and hip today and feels better following therapy sessions.    Currently in Pain? Yes   Pain Score 5    Pain Location Hip   Pain Orientation Right   Pain Descriptors / Indicators Aching;Tightness   Pain Type Other (Comment)   Pain Onset More than a month ago     Objective: Gait: ambulating with rollator into clinic, decreased weight  shift to right LE, decreased step length right LE, sit to stand with increased effort as compare to last week Palpation; + tenderness and decreased soft tissue mobility just superior to patella right LE       [facility] Adult PT Treatment/Exercise - 09/12/14 1006    Exercises   Exercises Other Exercises   Other Exercises  sitting on treatment table: AAROM right knee flexion/extension with ball and assistance of therapist through full ROM, , isometric knee flexion with assistance multiple angles x 3 sets with 5 second holds, sit to stand on from high table (~25") x 15 reps with guidance of therapist for proper weight shift with and without ball betweeen knees, endurance walking with concentration on equal step length and correct  gait pattern and weight shift using rollator for support as needed for balance x 150 feet, followed by independent  NuStep x ~10 min. level #1` workload with assistance to place right LE on pedal and verbal cuing to position LE's without excessive IR at hips and to press heels into pedals to simulate walking    Manual Therapy   Manual Therapy Soft tissue mobilization   Soft tissue mobilization with patient seated on treatemnt table: STM performed superficial and deep techniques to llateral aspect of thigh and along quadriceps attachment to attachment, patella mobilization performed with patella mobilizer x 2 sets     Patient response to treatment: improved soft tissue mobility in right thigh with decreased stiffness reported with knee flexion following treatment, improved weight shift and equal step length with repetition during walking exercise, decresaed assistance required for sit to stand with higher table height and with verbal cuing for increased weight shift to right LE           PT Education - 09/12/14 1006    Education provided Yes   Education Details continue with home program as instructed Patient Verbalized understanding             PT Long Term Goals - 09/10/14 1130    PT LONG TERM GOAL #1   Title Patient will demonstrate improved function with daily tasks involving right LE as indicated by LEFS score of 30/64 or better by 10/17/2014   Baseline 8/64 (severe self perceived disabiltiy) current 19/64   Status Revised   PT LONG TERM GOAL #2   Title Patient will improve 10 MW score to 13 seconds or less indicating improved community ambulation and decrease fall risk by 10/17/2014   Baseline 16 seconds   Status Revised   PT LONG TERM GOAL #3   Title Patient will be able to ambulate without assistive device for short household ambulation and appropriate assistive device outside the home for more independence in the community by 11/06/2014   Baseline Patient is ambulating  within one room a few steps without assistive device   Status Partially Met   PT LONG TERM GOAL #4   Title Patient will improve LEFS to 40/64 or better by 09/11/2014 demonstrating improved function with daily tasks involving LE   Baseline 8/64   Status Not Met   PT LONG TERM GOAL #5   Title Patient will improve 10MW to 10 seconds or less demonstrating improved community ambulation and decreased for falls by 11/06/2014   Baseline 10MW = 16 seconds   Status Revised   PT LONG TERM GOAL #6   Title Patient will be independent with home program without verbal cuing and be able to self manage symptoms and exercises by 11/06/2014  Baseline Patient has limited knowledg of appropriate exercises and progression to improve function and self manage symptoms at home   Status On-going               Plan - 09/12/14 1057    Clinical Impression Statement Patient demonstrates improvement with increased soft tissue mobility, decreased stiffness in right thigh with treatment. She continues with pain in right hip with sitting and walking/standing activities and has weakness in hip and knee and will benefit from additional physical therapy intervention to imrpove ambulation in communnity safely with appropriate assistive device.    Pt will benefit from skilled therapeutic intervention in order to improve on the following deficits Difficulty walking;Decreased endurance;Pain;Decreased strength   Rehab Potential Good   PT Frequency 2x / week   PT Duration 8 weeks   PT Treatment/Interventions Patient/family education;Therapeutic exercise;Manual techniques;Moist Heat;Cryotherapy   PT Next Visit Plan manual therapy to help with spasms, and pain, therapeutic exercise for strength and endurance        Problem List Patient Active Problem List   Diagnosis Date Noted  . Skin lesion of chest wall 07/17/2014  . Breast cancer 03/07/2013  . Personal history of malignant neoplasm of large intestine   . Malignant  neoplasm of upper-outer quadrant of female breast   . Cancer 12/24/2009  . ANXIETY 06/11/2008  . CONSTIPATION, CHRONIC 06/11/2008  . FIBROMYALGIA 06/11/2008    Jomarie Longs PT 09/12/2014, 12:03 PM  [facility] [address] [place], [address] Phone: [phone]   Fax:  [phone]

## 2014-09-15 ENCOUNTER — Ambulatory Visit: Payer: Medicare Other | Admitting: Physical Therapy

## 2014-09-15 ENCOUNTER — Encounter: Payer: Self-pay | Admitting: Physical Therapy

## 2014-09-15 DIAGNOSIS — M6281 Muscle weakness (generalized): Secondary | ICD-10-CM

## 2014-09-15 DIAGNOSIS — R262 Difficulty in walking, not elsewhere classified: Secondary | ICD-10-CM

## 2014-09-15 DIAGNOSIS — M25551 Pain in right hip: Secondary | ICD-10-CM

## 2014-09-15 DIAGNOSIS — M79604 Pain in right leg: Secondary | ICD-10-CM | POA: Diagnosis not present

## 2014-09-15 NOTE — Therapy (Signed)
[facility] [address], [place], [address] Phone: [phone]   Fax:  [phone]  Physical Therapy Treatment  Patient Details  Name: Terri Perez MRN: [id number] Date of Birth: 11/03/1945 Referring Provider:  Vela Prose, MD  Encounter Date: 09/15/2014      PT End of Session - 09/15/14 1230    Visit Number 14   Number of Visits 28   Date for PT Re-Evaluation 11/06/14   Authorization Type 14   Authorization Time Period 20   PT Start Time 1134   PT Stop Time 1220   PT Time Calculation (min) 46 min   Activity Tolerance Patient limited by fatigue;Patient tolerated treatment well   Behavior During Therapy [facility] for tasks assessed/performed      Past Medical History  Diagnosis Date  . Personal history of fibromyalgia 2002  . Unspecified constipation   . H/O cystitis 2011  . Special screening for malignant neoplasms, colon   . Arm fracture     right forearm, d/t fall  . Personal history of malignant neoplasm of large intestine   . Bowel trouble 2010  . Cancer 12/2009    Left UOQ breast tumor; wide excision,sn bx and whole breast radiation. Chemotherapy done. There was only a 38m area of residual tumor remaining. All sn were negative. This was ER-positive, PR-negative, HER-2 neu not over expressed tumor. The original ultrasound size was 2.6 cm(T2).  . 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 On wide excision 3 mm area of residual tumor. Sentinel node negative. ER-30%, PR-negative, HER-2 not overexpressing.  . Breast cancer 12/2009    left, chemo and radiation    Past Surgical History  Procedure Laterality Date  . Colonoscopy  2010    Dr. WAllen Norris normal  . Portacath placement  2011  . Port-a-cath removal  2013  . Breast surgery Left 2011    wide excision  . Breast biopsy Left 2011  . Breast cyst  aspiration Left 1985  . Skin cancer excision  1980    face  . Tubal ligation  1973  . Abdominal hysterectomy  1974  . Back surgery  1985    bone spurs between 5&6, used bone from left hip  . [facility]  . Femur fracture surgery Right March 2016  . Joint replacement Bilateral Jan 2016 and March 2016    hip    There were no vitals filed for this visit.  Visit Diagnosis:  Difficulty walking  Muscle weakness of lower extremity  Pain in right hip      Subjective Assessment - 09/15/14 1135    Subjective Patient arrived with chief complaint of pain in right hip and left hip groin region. She is more stiff today than painful. She is tired and has been for a couple of days. She is noticing improvement with ability to get up from sitting with less difficulty.    Limitations Sitting;Walking;House hold activities;Other (comment)  limited in community activities and shopping   Patient Stated Goals She wants to get her "right leg in motion"   Currently in Pain? Yes   Pain Score 5    Pain Location Hip   Pain Orientation Right   Pain Descriptors / Indicators Aching;Tightness   Pain Type Other (Comment)  right hip pain and surgical pain s/p femur fracture   Pain  Onset More than a month ago   Pain Frequency Intermittent   Aggravating Factors  prolonged walking, sitting, bike motion    Effect of Pain on Daily Activities limited in personal care in a.m. and walking activities in general   Multiple Pain Sites No             [facility] Adult PT Treatment/Exercise - 09/15/14 1832    Exercises   Exercises Other Exercises   Other Exercises  sitting on treatment table: AAROM right knee flexion/extension with ball and assistance of therapist through full ROM 0-115 degrees, isometric knee flexion with assistance multiple angles x 3 sets with 5 second holds, hip ER/abd in sitting with resistive band and assistance of therapist x 10 reps, hip ER with guided ROM x 10 without  resistance, sit to stand on from high table (~25") 3 x 5 reps with miniimal assistance of therapist for proper weight shift with ball betweeen knees,  knee extension with 2# weight on ankle 2 x 15 reps, walk side stepping on balance beam (foam) x 3 reps (~ 5 stesp to right and left) with close supervision/contact guard of therapist for safety and blance and patient using counter for support as needed,  (no NuStep today per patient request due to being very tired and stiff ) x10 min. level #3 workload    Manual Therapy   Manual Therapy Soft tissue mobilization   Soft tissue mobilization with patient seated on treatemnt table: STM performed superficial and deep techniques  lateral aspect of thigh and along quadriceps attachment to attachment, patella mobilization performed with patella mobilizer x 2 sets      Patient response to treatment: improved ROM with less difficulty in right knee and hip following manual techniques, required verbal cuing and assistance of therapist to perform ER of hip and perform standing/walking activities, improved ability to move right LE into abduction with walking on balance beam with repetition          PT Education - 09/15/14 1220    Education provided Yes   Education Details re inforced home program and healing takes time and consistent exercise helps this as she is able. She is expected to be tired some days and feel more energy on other days   Person(s) Educated Patient   Methods Explanation   Comprehension Verbalized understanding             PT Long Term Goals - 09/10/14 1130    PT LONG TERM GOAL #1   Title Patient will demonstrate improved function with daily tasks involving right LE as indicated by LEFS score of 30/64 or better by 10/17/2014   Baseline 8/64 (severe self perceived disabiltiy) current 19/64   Status Revised   PT LONG TERM GOAL #2   Title Patient will improve 10 MW score to 13 seconds or less indicating improved community ambulation  and decrease fall risk by 10/17/2014   Baseline 16 seconds   Status Revised   PT LONG TERM GOAL #3   Title Patient will be able to ambulate without assistive device for short household ambulation and appropriate assistive device outside the home for more independence in the community by 11/06/2014   Baseline Patient is ambulating within one room a few steps without assistive device   Status Partially Met   PT LONG TERM GOAL #4   Title Patient will improve LEFS to 40/64 or better by 09/11/2014 demonstrating improved function with daily tasks involving LE   Baseline 8/64  Status Not Met   PT LONG TERM GOAL #5   Title Patient will improve 10MW to 10 seconds or less demonstrating improved community ambulation and decreased for falls by 11/06/2014   Baseline 10MW = 16 seconds   Status Revised   PT LONG TERM GOAL #6   Title Patient will be independent with home program without verbal cuing and be able to self manage symptoms and exercises by 11/06/2014   Baseline Patient has limited knowledg of appropriate exercises and progression to improve function and self manage symptoms at home   Status On-going               Plan - 09/15/14 1230    Clinical Impression Statement Patient is improving ability to transfer sit to stand and walk with improving endurance. She is limited in exercise/NUstep today due to fatigue and increased stiffness felt in both hips. she is expected to continue to improve strength and endurance with continued physical therapy intervention.   Pt will benefit from skilled therapeutic intervention in order to improve on the following deficits Difficulty walking;Decreased endurance;Pain;Decreased strength   Rehab Potential Good   PT Frequency 2x / week   PT Duration 8 weeks   PT Treatment/Interventions Patient/family education;Therapeutic exercise;Manual techniques;Moist Heat;Cryotherapy   PT Next Visit Plan manual therapy to help with spasms, and pain, therapeutic exercise  for strength and endurance        Problem List Patient Active Problem List   Diagnosis Date Noted  . Skin lesion of chest wall 07/17/2014  . Breast cancer 03/07/2013  . Personal history of malignant neoplasm of large intestine   . 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 Cancer 12/24/2009  . ANXIETY 06/11/2008  . CONSTIPATION, CHRONIC 06/11/2008  . FIBROMYALGIA 06/11/2008    Jomarie Longs PT 09/15/2014, 6:41 PM  [facility] [address] [place], [address] Phone: [phone]   Fax:  [phone]

## 2014-09-16 ENCOUNTER — Ambulatory Visit (INDEPENDENT_AMBULATORY_CARE_PROVIDER_SITE_OTHER): Payer: Medicare Other | Admitting: Family Medicine

## 2014-09-16 ENCOUNTER — Encounter: Payer: Self-pay | Admitting: Family Medicine

## 2014-09-16 VITALS — BP 128/82 | HR 82 | Temp 97.4°F | Resp 16 | Ht 69.0 in | Wt 198.0 lb

## 2014-09-16 DIAGNOSIS — L570 Actinic keratosis: Secondary | ICD-10-CM | POA: Diagnosis not present

## 2014-09-16 DIAGNOSIS — Z0000 Encounter for general adult medical examination without abnormal findings: Secondary | ICD-10-CM

## 2014-09-16 DIAGNOSIS — E785 Hyperlipidemia, unspecified: Secondary | ICD-10-CM

## 2014-09-16 DIAGNOSIS — M8580 Other specified disorders of bone density and structure, unspecified site: Secondary | ICD-10-CM

## 2014-09-16 DIAGNOSIS — M791 Myalgia, unspecified site: Secondary | ICD-10-CM

## 2014-09-16 DIAGNOSIS — K5900 Constipation, unspecified: Secondary | ICD-10-CM | POA: Diagnosis not present

## 2014-09-16 DIAGNOSIS — M810 Age-related osteoporosis without current pathological fracture: Secondary | ICD-10-CM

## 2014-09-16 NOTE — Progress Notes (Signed)
[name], female   DOB: Nov 04, 1945, 69 y.o.   MRN: [id number] Patient: Terri Perez, Female    DOB: 11-07-1945, 69 y.o.   MRN: [id number] Visit Date: 09/16/2014  Today's Provider: [name], MD   Chief Complaint  Patient presents with  . Annual Exam   Subjective:   Terri Perez is a 69 y.o. female who presents today for her Subsequent Annual Wellness Visit. She feels well. She reports she is not exercising, she can not. She reports she is sleeping Fairly well. She gets up a lot during the night.   EKG- 07/01/09 BMD- 03/27/01- Osteopenia Pap- 1999 pt has had partial hysterectomy Td- 03/01/95 Prevnar- 12/12/13 Mammo- followed by Dr. Erroll Luna  Review of Systems  HENT: Negative.   Eyes: Negative.   Respiratory: Negative.   Cardiovascular: Negative.   Gastrointestinal: Positive for abdominal distention.  Endocrine: Negative.   Genitourinary: Negative.   Musculoskeletal: Positive for joint swelling.  Allergic/Immunologic: Negative.   Neurological: Negative.   Hematological: Negative.   Psychiatric/Behavioral: Negative.     Patient Active Problem List   Diagnosis Date Noted  . Skin lesion of chest wall 07/17/2014  . Breast cancer 03/07/2013  . Personal history of malignant neoplasm of large intestine   . 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 Cancer 12/24/2009  . ANXIETY 06/11/2008  . CONSTIPATION, CHRONIC 06/11/2008  . FIBROMYALGIA 06/11/2008    Social History   Social History  . Marital Status: Married    Spouse Name: N/A  . Number of Children: N/A  . Years of Education: N/A   Occupational History  . Not on file.   Social History Main Topics  . Smoking status: Never Smoker   . Smokeless tobacco: Not on file  . Alcohol Use: No  . Drug Use: No  . Sexual Activity: Not on file   Other Topics Concern  . Not on file   Social History Narrative    Past Surgical History  Procedure Laterality Date   . Colonoscopy  2010    Dr. Allen Norris, normal  . Portacath placement  2011  . Port-a-cath removal  2013  . Breast surgery Left 2011    wide excision  . Breast biopsy Left 2011  . Breast cyst aspiration Left 1985  . Skin cancer excision  1980    face  . Tubal ligation  1973  . Abdominal hysterectomy  1974  . Back surgery  1985    bone spurs between 5&6, used bone from left hip  . [facility]  Sept 2014    [facility]   . Femur fracture surgery Right March 2016  . Joint replacement Bilateral Jan 2016 and March 2016    hip    Her family history includes Cancer in her brother and other; Cancer (age of onset: 21) in her other.    Outpatient Prescriptions Prior to Visit  Medication Sig Dispense Refill  . ALPRAZolam (XANAX) 0.25 MG tablet Take 0.25 mg by mouth at bedtime as needed for anxiety.    . cyclobenzaprine (FLEXERIL) 10 MG tablet Take 1 tablet (10 mg total) by mouth 3 (three) times daily. 90 tablet 4  . traMADol (ULTRAM) 50 MG tablet TAKE 1 TO 2 TABLETS BY MOUTH EVERY 4 HOURS FOR PAIN  0   No facility-administered medications prior to visit.    Allergies  Allergen Reactions  . Milk-Related Compounds   . Oxycodone-Acetaminophen   . Vicodin [Hydrocodone-Acetaminophen] Nausea Only  .  Wheat Bran   . Tape Rash    Rash from steri strips    Patient Care Team: Jerrol Banana., MD as PCP - General (Family Medicine) Robert Bellow, MD (General Surgery)  Objective:   Vitals:  Filed Vitals:   09/16/14 1419  BP: 128/82  Pulse: 82  Temp: 97.4 F (36.3 C)  TempSrc: Oral  Resp: 16  Height: 5\' 9"  (1.753 m)  Weight: 198 lb (89.812 kg)    Physical Exam  Constitutional: She is oriented to person, place, and time. She appears well-developed and well-nourished.  HENT:  Head: Normocephalic and atraumatic.  Right Ear: External ear normal.  Left Ear: External ear normal.  Mouth/Throat: Oropharynx is clear and moist.  Eyes: Conjunctivae and EOM are normal. Pupils are  equal, round, and reactive to light.  Neck: Normal range of motion. Neck supple.  Cardiovascular: Normal rate, regular rhythm, normal heart sounds and intact distal pulses.   Pulmonary/Chest: Effort normal and breath sounds normal.  Abdominal: Soft. Bowel sounds are normal.  Neurological: She is alert and oriented to person, place, and time. She has normal reflexes.  Skin: Skin is warm and dry.  Irritated Keratosis of right upper leg  Psychiatric: She has a normal mood and affect. Her behavior is normal. Judgment and thought content normal.    Activities of Daily Living In your present state of health, do you have any difficulty performing the following activities: 09/16/2014  Hearing? N  Vision? N  Difficulty concentrating or making decisions? N  Walking or climbing stairs? Y  Dressing or bathing? Y  Doing errands, shopping? Y    Fall Risk Assessment Fall Risk  09/16/2014  Falls in the past year? Yes  Number falls in past yr: 2 or more  Injury with Fall? Yes  Risk Factor Category  High Fall Risk  Risk for fall due to : Impaired mobility     Depression Screen PHQ 2/9 Scores 09/16/2014  PHQ - 2 Score 0    Cognitive Testing - 6-CIT    Year: 0 points  Month: 0 points  Memorize "Pia Mau, [address] [place]"  Time (within 1 hour:) 0 points  Count backwards from 20: 0 points  Name months of year: 0 points  Repeat Address: 4 points   Total Score: 4/28  Interpretation : Normal (0-7) Abnormal (8-28)    Assessment & Plan:     Annual Wellness Visit  Reviewed patient's Family Medical History Reviewed and updated list of patient's medical providers Assessment of cognitive impairment was done Assessed patient's functional ability Established a written schedule for health screening [place] Completed and Reviewed  Exercise Activities and Dietary recommendations Goals    None       There is no immunization history on file for this  patient.  Health Maintenance  Topic Date Due  . Hepatitis C Screening  07[birth year]  . TETANUS/TDAP  01/15/1964  . COLONOSCOPY  01/15/1995  . ZOSTAVAX  01/14/2005  . DEXA SCAN  01/14/2010  . PNA vac Low Risk Adult (1 of 2 - PCV13) 01/14/2010  . INFLUENZA VACCINE  08/25/2014  . MAMMOGRAM  07/08/2016    Pt  did have normal colonoscopy on 03/12/2008   Discussed health benefits of physical activity, and encouraged her to engage in regular exercise appropriate for her age and condition.   fall risk-- is discussed at some length today as patient has had fractures including pelvic fracture from a fall. We'll try to  obtain bone density as this is appropriate with her falls and fractures. Last BMD that can be found was 2011. Chronic anxiety--stable. Miguel Aschoff MD [facility] 09/16/2014 2:24 PM  ------------------------------------------------------------------------------------------------------------

## 2014-09-17 LAB — CBC WITH DIFFERENTIAL/PLATELET
Basophils Absolute: 0 x10E3/uL (ref 0.0–0.2)
Basos: 0 %
EOS (ABSOLUTE): 0.3 x10E3/uL (ref 0.0–0.4)
Eos: 3 %
Hematocrit: 38.5 % (ref 34.0–46.6)
Hemoglobin: 12.3 g/dL (ref 11.1–15.9)
Immature Grans (Abs): 0 x10E3/uL (ref 0.0–0.1)
Immature Granulocytes: 0 %
Lymphocytes Absolute: 2.4 x10E3/uL (ref 0.7–3.1)
Lymphs: 31 %
MCH: 29.1 pg (ref 26.6–33.0)
MCHC: 31.9 g/dL (ref 31.5–35.7)
MCV: 91 fL (ref 79–97)
Monocytes Absolute: 0.5 x10E3/uL (ref 0.1–0.9)
Monocytes: 7 %
Neutrophils Absolute: 4.6 x10E3/uL (ref 1.4–7.0)
Neutrophils: 59 %
Platelets: 271 x10E3/uL (ref 150–379)
RBC: 4.22 x10E6/uL (ref 3.77–5.28)
RDW: 16.7 % — ABNORMAL HIGH (ref 12.3–15.4)
WBC: 7.8 x10E3/uL (ref 3.4–10.8)

## 2014-09-17 LAB — LIPID PANEL WITH LDL/HDL RATIO
CHOLESTEROL TOTAL: 229 mg/dL — AB (ref 100–199)
HDL: 117 mg/dL (ref >39)
LDL Calculated: 97 mg/dL (ref 0–99)
LDl/HDL Ratio: 0.8 ratio units (ref 0.0–3.2)
TRIGLYCERIDES: 76 mg/dL (ref 0–149)
VLDL CHOLESTEROL CAL: 15 mg/dL (ref 5–40)

## 2014-09-17 LAB — COMPREHENSIVE METABOLIC PANEL WITH GFR
ALT: 17 IU/L (ref 0–32)
AST: 14 IU/L (ref 0–40)
Albumin/Globulin Ratio: 1.5 (ref 1.1–2.5)
Albumin: 4.2 g/dL (ref 3.6–4.8)
Alkaline Phosphatase: 127 IU/L — ABNORMAL HIGH (ref 39–117)
BUN/Creatinine Ratio: 26 (ref 11–26)
BUN: 26 mg/dL (ref 8–27)
Bilirubin Total: 0.2 mg/dL (ref 0.0–1.2)
CO2: 24 mmol/L (ref 18–29)
Calcium: 10.4 mg/dL — ABNORMAL HIGH (ref 8.7–10.3)
Chloride: 101 mmol/L (ref 97–108)
Creatinine, Ser: 1.01 mg/dL — ABNORMAL HIGH (ref 0.57–1.00)
GFR calc Af Amer: 66 mL/min/1.73
GFR calc non Af Amer: 57 mL/min/1.73 — ABNORMAL LOW
Globulin, Total: 2.8 g/dL (ref 1.5–4.5)
Glucose: 95 mg/dL (ref 65–99)
Potassium: 5.8 mmol/L — ABNORMAL HIGH (ref 3.5–5.2)
Sodium: 141 mmol/L (ref 134–144)
Total Protein: 7 g/dL (ref 6.0–8.5)

## 2014-09-17 LAB — TSH: TSH: 0.882 u[IU]/mL (ref 0.450–4.500)

## 2014-09-17 LAB — CK: Total CK: 50 U/L (ref 24–173)

## 2014-09-18 ENCOUNTER — Ambulatory Visit: Payer: Medicare Other | Admitting: Physical Therapy

## 2014-09-18 ENCOUNTER — Encounter: Payer: Self-pay | Admitting: Physical Therapy

## 2014-09-18 DIAGNOSIS — M25551 Pain in right hip: Secondary | ICD-10-CM | POA: Diagnosis not present

## 2014-09-18 DIAGNOSIS — M6281 Muscle weakness (generalized): Secondary | ICD-10-CM

## 2014-09-18 DIAGNOSIS — R262 Difficulty in walking, not elsewhere classified: Secondary | ICD-10-CM | POA: Diagnosis not present

## 2014-09-18 DIAGNOSIS — M79604 Pain in right leg: Secondary | ICD-10-CM | POA: Diagnosis not present

## 2014-09-19 NOTE — Therapy (Signed)
[place] [address], [place], [address] Phone: [phone]   Fax:  [phone]  Physical Therapy Treatment  Patient Details  Name: Terri Perez MRN: [id number] Date of Birth: [birth year] Referring Provider:  Vela Prose, MD  Encounter Date: 09/18/2014      PT End of Session - 09/18/14 1220    Visit Number 15   Number of Visits 28   Date for PT Re-Evaluation 11/06/14   Authorization Type [id number]   Authorization Time Period 20   PT Start Time 1132   PT Stop Time 1215   PT Time Calculation (min) 43 min   Activity Tolerance Patient limited by fatigue;Patient tolerated treatment well   Behavior During Therapy [facility] for tasks assessed/performed      Past Medical History  Diagnosis Date  . Personal history of fibromyalgia 2002  . Unspecified constipation   . H/O cystitis 2011  . Special screening for malignant neoplasms, colon   . Arm fracture     right forearm, d/t fall  . Personal history of malignant neoplasm of large intestine   . Bowel trouble 2010  . Cancer 12/2009    Left UOQ breast tumor; wide excision,sn bx and whole breast radiation. Chemotherapy done. There was only a 17m area of residual tumor remaining. All sn were negative. This was ER-positive, PR-negative, HER-2 neu not over expressed tumor. The original ultrasound size was 2.6 cm(T2).  . 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 On wide excision 3 mm area of residual tumor. Sentinel node negative. ER-30%, PR-negative, HER-2 not overexpressing.  . Breast cancer 12/2009    left, chemo and radiation    Past Surgical History  Procedure Laterality Date  . Colonoscopy  2010    Dr. WAllen Norris normal  . Portacath placement  2011  . Port-a-cath removal  2013  . Breast surgery Left 2011    wide excision  . Breast biopsy Left 2011  . Breast cyst  aspiration Left 1985  . Skin cancer excision  1980    face  . Tubal ligation  1973  . Abdominal hysterectomy  1974  . Back surgery  1985    bone spurs between 5&6, used bone from left hip  . [facility]  . Femur fracture surgery Right March 2016  . Joint replacement Bilateral Jan 2016 and March 2016    hip    There were no vitals filed for this visit.  Visit Diagnosis:  Difficulty walking  Muscle weakness of lower extremity  Pain in right hip      Subjective Assessment - 09/18/14 1132    Subjective Patient reports she is feeling more confident with placing weight on right LE. She is more tired in her leg today. She is seeing small gains and is becoming more independent with personal care at home. She still requires assistance to put on shoes and socks. She feels she continues to require physical therapy for her to be able to progress with walking and strength.  Pain: denies pain in right hip and only mild stiffness in right thigh today      Objective: Gait: ambulating using rollator with improved step length and improved ability to walk backwards with less hesitation to sit down. Palpation: + spasms along central thigh right LE, decreased soft tissue elasticity along lateral thigh vastus lateralis muscle  of quadriceps Strength: right LE decreased hip ER, knee flexion/extension: improving as compared to previous week           [facility] Adult PT Treatment/Exercise - 09/18/14 1133    Exercises   Exercises Other Exercises   Other Exercises  sitting on treatment table: AAROM right knee flexion/extension with ball and assistance of therapist through full ROM 0-115 degrees, isometric knee flexion with assistance multiple angles x 3 sets with 5 second holds, sit to stand on from high table (~25") 3 x 5 reps with miniimal assistance of therapist for proper weight shift with ball betweeen knees,  knee extension with 3# weight on ankle 2 x 15 reps, walk side  stepping on balance beam (foam) x 3 reps (~ 5 stesp to right and left) with close supervision/contact guard of therapist for safety and blance and patient using counter for support as needed, walk with rollator x 200 feet with close supervision of therapist, step ups 2 x 5 reps each LE on 4" step with UE support of left UE only,   (no NuStep today per patient request due to being very tired and stiff )    Manual Therapy   Manual Therapy Soft tissue mobilization   Soft tissue mobilization with patient seated on treatemnt table: STM performed superficial and deep techniques to lateral aspect of thigh and along quadriceps attachment to attachment, patella mobilization performed with patella mobilizer x 2 sets       Patient response to treatment: improved ROM with less difficulty in right knee and hip following manual techniques, required verbal cuing and assistance of therapist to perform ER of hip and perform standing/walking activities, improved ability to move right LE into abduction with walking on balance beam with repetition, advancing intensity and weight bearing activities with step up exercises today               PT Education - 09/18/14 1215    Education provided Yes   Education Details Reinforced healing process and encouraged patient that she is progressing well with improved strnegth and ability to walk with less difficulty/pain, conintue ro instruct with verbal cuing and demonstration for mose exercises   Person(s) Educated Patient   Methods Explanation;Verbal cues   Comprehension Verbalized understanding             PT Long Term Goals - 09/10/14 1130    PT LONG TERM GOAL #1   Title Patient will demonstrate improved function with daily tasks involving right LE as indicated by LEFS score of 30/64 or better by 10/17/2014   Baseline 8/64 (severe self perceived disabiltiy) current 19/64   Status Revised   PT LONG TERM GOAL #2   Title Patient will improve 10 MW score  to 13 seconds or less indicating improved community ambulation and decrease fall risk by 10/17/2014   Baseline 16 seconds   Status Revised   PT LONG TERM GOAL #3   Title Patient will be able to ambulate without assistive device for short household ambulation and appropriate assistive device outside the home for more independence in the community by 11/06/2014   Baseline Patient is ambulating within one room a few steps without assistive device   Status Partially Met   PT LONG TERM GOAL #4   Title Patient will improve LEFS to 40/64 or better by 09/11/2014 demonstrating improved function with daily tasks involving LE   Baseline 8/64   Status Not Met   PT LONG TERM GOAL #5   Title  Patient will improve 10MW to 10 seconds or less demonstrating improved community ambulation and decreased for falls by 11/06/2014   Baseline 10MW = 16 seconds   Status Revised   PT LONG TERM GOAL #6   Title Patient will be independent with home program without verbal cuing and be able to self manage symptoms and exercises by 11/06/2014   Baseline Patient has limited knowledg of appropriate exercises and progression to improve function and self manage symptoms at home   Status On-going               Plan - 09/18/14 1216    Clinical Impression Statement Patient demonstrated improved ER in right hip and improving abiltiy to walk without walkier for a few steps. She is progresing well with exercises and advanced to step ups with only one hand/UE for support.    Pt will benefit from skilled therapeutic intervention in order to improve on the following deficits Difficulty walking;Decreased endurance;Pain;Decreased strength   Rehab Potential Good   PT Frequency 2x / week   PT Duration 8 weeks   PT Treatment/Interventions Patient/family education;Therapeutic exercise;Manual techniques;Moist Heat;Cryotherapy   PT Next Visit Plan manual therapy to help with spasms, and pain, therapeutic exercise for strength and  endurance        Problem List Patient Active Problem List   Diagnosis Date Noted  . Skin lesion of chest wall 07/17/2014  . Breast cancer 03/07/2013  . Personal history of malignant neoplasm of large intestine   . 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 Cancer 12/24/2009  . ANXIETY 06/11/2008  . CONSTIPATION, CHRONIC 06/11/2008  . FIBROMYALGIA 06/11/2008    Jomarie Longs PT 09/19/2014, 2:14 PM  [place] [facility] [address] [place], [address] Phone: [phone]   Fax:  [phone]

## 2014-09-23 ENCOUNTER — Encounter: Payer: Self-pay | Admitting: Physical Therapy

## 2014-09-23 ENCOUNTER — Ambulatory Visit: Payer: Medicare Other | Admitting: Physical Therapy

## 2014-09-23 DIAGNOSIS — M79604 Pain in right leg: Secondary | ICD-10-CM | POA: Diagnosis not present

## 2014-09-23 DIAGNOSIS — R262 Difficulty in walking, not elsewhere classified: Secondary | ICD-10-CM

## 2014-09-23 DIAGNOSIS — M25551 Pain in right hip: Secondary | ICD-10-CM | POA: Diagnosis not present

## 2014-09-23 DIAGNOSIS — M6281 Muscle weakness (generalized): Secondary | ICD-10-CM | POA: Diagnosis not present

## 2014-09-23 NOTE — Therapy (Signed)
[place] [facility] [address], [place], [address] Phone: [phone]   Fax:  [phone]  Physical Therapy Treatment  Patient Details  Name: Terri Perez MRN: [id number] Date of Birth: [birth year] Referring Provider:  Vela Prose, MD  Encounter Date: 09/23/2014      PT End of Session - 09/23/14 1240    Visit Number 16   Number of Visits 28   Date for PT Re-Evaluation 11/06/14   Authorization Type 16   Authorization Time Period 20   PT Start Time 1145   PT Stop Time 1235   PT Time Calculation (min) 50 min   Activity Tolerance Patient limited by fatigue;Patient tolerated treatment well   Behavior During Therapy [facility] for tasks assessed/performed      Past Medical History  Diagnosis Date  . Personal history of fibromyalgia 2002  . Unspecified constipation   . H/O cystitis 2011  . Special screening for malignant neoplasms, colon   . Arm fracture     right forearm, d/t fall  . Personal history of malignant neoplasm of large intestine   . Bowel trouble 2010  . Cancer 12/2009    Left UOQ breast tumor; wide excision,sn bx and whole breast radiation. Chemotherapy done. There was only a 43m area of residual tumor remaining. All sn were negative. This was ER-positive, PR-negative, HER-2 neu not over expressed tumor. The original ultrasound size was 2.6 cm(T2).  . 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 On wide excision 3 mm area of residual tumor. Sentinel node negative. ER-30%, PR-negative, HER-2 not overexpressing.  . Breast cancer 12/2009    left, chemo and radiation    Past Surgical History  Procedure Laterality Date  . Colonoscopy  2010    Dr. WAllen Norris normal  . Portacath placement  2011  . Port-a-cath removal  2013  . Breast surgery Left 2011    wide excision  . Breast biopsy Left 2011  . Breast cyst  aspiration Left 1985  . Skin cancer excision  1980    face  . Tubal ligation  1973  . Abdominal hysterectomy  1974  . Back surgery  1985    bone spurs between 5&6, used bone from left hip  . [facility]  . Femur fracture surgery Right March 2016  . Joint replacement Bilateral Jan 2016 and March 2016    hip    There were no vitals filed for this visit.  Visit Diagnosis:  Difficulty walking  Muscle weakness of lower extremity  Pain in right hip      Subjective Assessment - 09/23/14 1153    Subjective Patient reports she is feeling more confident with placing weight on right LE. She is more tired and stiff  in her leg. She stood up to cook over the weekend and is now feeling sore in both thighs.    Limitations Sitting;Walking;House hold activities;Other (comment)   Patient Stated Goals She wants to get her "right leg in motion"   Currently in Pain? Yes   Pain Score 5    Pain Location Other (Comment)  thighs   Pain Orientation Right   Pain Descriptors / Indicators Tightness   Pain Onset More than a month ago      Objective: Palpation: + spasms palpable along right thigh central and lateral aspects Gait: decreased weight shift right,  decreased trunk rotation, decreased step length right, decreased hip/knee flexion, decreased hip extension at push off AROM: Hip: right with decreased ER and abduction Knee: AAROM 0-115/120 degrees with assistance Strength: right hip ER and abduction and extension decreased 30% as compared to left LE       [facility] Adult PT Treatment/Exercise - 09/23/14 2318    Exercises   Exercises Other Exercises   Other Exercises  sitting on treatment table: AAROM right knee flexion/extension with ball and assistance of therapist through full ROM 0-115/120 degrees, isometric knee flexion with assistance multiple angles x 3 sets with 5 second holds, sit to stand on from high table (~25") 3 x 5 reps with miniimal assistance of therapist  for proper weight shift to right and hold at varied points when sitting for eccentric hold/lowering with ball betweeen knees,  knee extension with 4# weight on ankle 2 x 15 reps, manual resistive hip ER and abduction in sitting 2 x 10 reps with moderate resistance given to right LE     Manual Therapy   Manual Therapy Soft tissue mobilization   Soft tissue mobilization with patient seated on treatemnt table: STM performed superficial and deep techniques to lower back right side including QL and glteal muscles, lateral aspect of thigh and along quadriceps attachment to attachment, patella mobilization performed with patella mobilizer x 2 sets      Patient response to treatment: Patient demonstrated improved control with right hip ER and abduction and was able to tolerate increased resistance with exercises today.Patient required verbal and tactile cues to perform all exercises through full ROM with good alignment of hip/knee patient demonstrated decreased spasms by 50% with STM to quadriceps/thigh muscles           PT Education - 09/23/14 1235    Education provided Yes   Education Details educated in exercises and aquatic therapy to assist in strengthening LE's to imropve walking   Person(s) Educated Patient;Spouse   Methods Explanation   Comprehension Verbalized understanding             PT Long Term Goals - 09/10/14 1130    PT LONG TERM GOAL #1   Title Patient will demonstrate improved function with daily tasks involving right LE as indicated by LEFS score of 30/64 or better by 10/17/2014   Baseline 8/64 (severe self perceived disabiltiy) current 19/64   Status Revised   PT LONG TERM GOAL #2   Title Patient will improve 10 MW score to 13 seconds or less indicating improved community ambulation and decrease fall risk by 10/17/2014   Baseline 16 seconds   Status Revised   PT LONG TERM GOAL #3   Title Patient will be able to ambulate without assistive device for short household  ambulation and appropriate assistive device outside the home for more independence in the community by 11/06/2014   Baseline Patient is ambulating within one room a few steps without assistive device   Status Partially Met   PT LONG TERM GOAL #4   Title Patient will improve LEFS to 40/64 or better by 09/11/2014 demonstrating improved function with daily tasks involving LE   Baseline 8/64   Status Not Met   PT LONG TERM GOAL #5   Title Patient will improve 10MW to 10 seconds or less demonstrating improved community ambulation and decreased for falls by 11/06/2014   Baseline 10MW = 16 seconds   Status Revised   PT LONG TERM GOAL #6   Title Patient will be independent with  home program without verbal cuing and be able to self manage symptoms and exercises by 11/06/2014   Baseline Patient has limited knowledg of appropriate exercises and progression to improve function and self manage symptoms at home   Status On-going               Plan - 09/23/14 1240    Clinical Impression Statement Patient improving strength and ability to stand from sitting with less difficulty. She continues with weakness in right LE with spasms and may benefit from aquatic therapy exercises to assist with strenthening with buoyancy that may benefit patient for improved walking. Plan: contact MD regarding addition of aquatic therapy.    Pt will benefit from skilled therapeutic intervention in order to improve on the following deficits Difficulty walking;Decreased endurance;Pain;Decreased strength   Rehab Potential Good   PT Frequency 2x / week   PT Duration 8 weeks   PT Treatment/Interventions Patient/family education;Therapeutic exercise;Manual techniques;Moist Heat;Cryotherapy        Problem List Patient Active Problem List   Diagnosis Date Noted  . Skin lesion of chest wall 07/17/2014  . Breast cancer 03/07/2013  . Personal history of malignant neoplasm of large intestine   . Malignant neoplasm of  upper-outer quadrant of female breast   . Cancer 12/24/2009  . ANXIETY 06/11/2008  . CONSTIPATION, CHRONIC 06/11/2008  . FIBROMYALGIA 06/11/2008    Jomarie Longs PT 09/23/2014, 11:29 PM  [place] [facility] [address], [place], [address] Phone: [phone]   Fax:  [phone]

## 2014-09-24 ENCOUNTER — Ambulatory Visit
Admission: RE | Admit: 2014-09-24 | Discharge: 2014-09-24 | Disposition: A | Discharge status: Home / Self Care | Payer: Medicare Other | Source: Ambulatory Visit | Attending: Family Medicine | Admitting: Family Medicine

## 2014-09-24 DIAGNOSIS — M810 Age-related osteoporosis without current pathological fracture: Secondary | ICD-10-CM | POA: Insufficient documentation

## 2014-09-24 DIAGNOSIS — Z780 Asymptomatic menopausal state: Secondary | ICD-10-CM | POA: Diagnosis not present

## 2014-09-25 ENCOUNTER — Other Ambulatory Visit: Payer: Self-pay | Admitting: Emergency Medicine

## 2014-09-25 ENCOUNTER — Telehealth: Payer: Self-pay | Admitting: Emergency Medicine

## 2014-09-25 ENCOUNTER — Ambulatory Visit: Payer: Medicare Other | Admitting: Physical Therapy

## 2014-09-25 DIAGNOSIS — M810 Age-related osteoporosis without current pathological fracture: Secondary | ICD-10-CM

## 2014-09-25 NOTE — Telephone Encounter (Signed)
Done

## 2014-09-25 NOTE — Telephone Encounter (Signed)
You put a referral in Epic for rheumatology.The note states to refer to Dr Jaymes Graff is an endocrinologist.Can you change this on referral.Thanks

## 2014-09-26 ENCOUNTER — Ambulatory Visit: Payer: Medicare Other | Attending: Student | Admitting: Physical Therapy

## 2014-09-26 ENCOUNTER — Telehealth: Payer: Self-pay | Admitting: Family Medicine

## 2014-09-26 ENCOUNTER — Encounter: Payer: Self-pay | Admitting: Physical Therapy

## 2014-09-26 DIAGNOSIS — M25659 Stiffness of unspecified hip, not elsewhere classified: Secondary | ICD-10-CM | POA: Diagnosis not present

## 2014-09-26 DIAGNOSIS — M6281 Muscle weakness (generalized): Secondary | ICD-10-CM | POA: Diagnosis not present

## 2014-09-26 DIAGNOSIS — M25551 Pain in right hip: Secondary | ICD-10-CM | POA: Diagnosis not present

## 2014-09-26 DIAGNOSIS — R262 Difficulty in walking, not elsewhere classified: Secondary | ICD-10-CM | POA: Insufficient documentation

## 2014-09-26 NOTE — Telephone Encounter (Signed)
Husband advised-aa 

## 2014-09-26 NOTE — Telephone Encounter (Signed)
Husband advised that cryopen is still not [place]

## 2014-09-26 NOTE — Telephone Encounter (Signed)
Pt stated that when she was last in the office for her CPE a nodule was found. Pt stated she was told she could come back and get it frozen off. Pt wanted to know if the machine was working and could she just come in. I advised that she would need an appt and if she wanted to come in today I could schedule it with a PA. Pt wanted to make sure the machine was working first. Please advise. Thanks TNP

## 2014-09-26 NOTE — Therapy (Signed)
[facility] [place] [address] [place], [address] Phone: [phone]   Fax:  [phone]  Physical Therapy Treatment  Patient Details  Name: Terri Perez MRN: [id number] Date of Birth: Jan 07, 1946 Referring Provider:  Vela Prose, MD  Encounter Date: 09/26/2014      PT End of Session - 09/26/14 1200    Visit Number 17   Number of Visits 28   Date for PT Re-Evaluation 11/06/14   Authorization Type 17   Authorization Time Period 20   PT Start Time 1105   PT Stop Time 1155   PT Time Calculation (min) 50 min   Activity Tolerance Patient limited by fatigue;Patient tolerated treatment well   Behavior During Therapy [facility] for tasks assessed/performed      Past Medical History  Diagnosis Date  . Personal history of fibromyalgia 2002  . Unspecified constipation   . H/O cystitis 2011  . Special screening for malignant neoplasms, colon   . Arm fracture     right forearm, d/t fall  . Personal history of malignant neoplasm of large intestine   . Bowel trouble 2010  . Cancer 12/2009    Left UOQ breast tumor; wide excision,sn bx and whole breast radiation. Chemotherapy done. There was only a 21m area of residual tumor remaining. All sn were negative. This was ER-positive, PR-negative, HER-2 neu not over expressed tumor. The original ultrasound size was 2.6 cm(T2).  . 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 On wide excision 3 mm area of residual tumor. Sentinel node negative. ER-30%, PR-negative, HER-2 not overexpressing.  . Breast cancer 12/2009    left, chemo and radiation    Past Surgical History  Procedure Laterality Date  . Colonoscopy  2010    Dr. WAllen Norris normal  . Portacath placement  2011  . Port-a-cath removal  2013  . Breast surgery Left 2011    wide excision  . Breast biopsy Left 2011  . Breast cyst  aspiration Left 1985  . Skin cancer excision  1980    face  . Tubal ligation  1973  . Abdominal hysterectomy  1974  . Back surgery  1985    bone spurs between 5&6, used bone from left hip  . [facility]  . Femur fracture surgery Right March 2016  . Joint replacement Bilateral Jan 2016 and March 2016    hip    There were no vitals filed for this visit.  Visit Diagnosis:  Difficulty walking  Muscle weakness of lower extremity  Pain in right hip      Subjective Assessment - 09/26/14 1106    Subjective Patient reports she is feeling better and walking more at home as insructed: 3 laps in the house, once a day with fatigue noted, She is looking forward to water exercises guided by therapist next week. She is still not confident with walking outside the home and fatigues easily and has right hip pain with increased activity.   Limitations Sitting;Walking;House hold activities;Other (comment)   Patient Stated Goals She wants to get her "right leg in motion"   Currently in Pain? Yes   Pain Score 2    Pain Location Hip   Pain Orientation Right   Pain Descriptors / Indicators Tightness   Pain Type Other (Comment)  right hip and thigh s/p fracture and multiple surgeries  Pain Onset More than a month ago     Objective: Gait: ambulating with rollator with improved cadence (continues to be slow and cautious) and improving gait pattern with increased hip and knee flexion and hip extension as compared to previous session AROM: right knee flexion 0-115/120 with assistance, decreased right hip ER (improving) Strength: decreased right hip flexion, ER, abduction, knee flexion 4-/5 grossly tested and as compared to left Palpation; decreased soft tissue elasticity along quadriceps midline and laterally from anterior hip to patella with + tenderness       [facility] Adult PT Treatment/Exercise - 09/26/14 1112    Exercises   Exercises Other Exercises   Other Exercises   sitting on treatment table: AAROM right knee flexion/extension isometric knee flexion with assistance multiple angles x 3 sets with 5 second holds, sit to stand  from high table (~25") 3 x 5 reps with miniimal assistance of therapist for proper weight shift with ball betweeen knees,  knee extension with 4# weight on ankle 2 x 15 reps, isometric hip abduction with ER in sitting x 10 reps with graded manuall resistance (moderate),     Manual Therapy   Manual Therapy Soft tissue mobilization   Soft tissue mobilization with patient seated on treatemnt table: STM performed superficial and deep techniques to  lateral aspect of thigh and along quadriceps attachment to attachment, patella mobilization performed with patella mobilizer x 2 sets     Patient response to treatment: improved soft tissue elasticity with improved flexibility in right knee flexion and ER of hip, improved ability to transfer sit to stand with decreased assistance and improved gait pattern with increased hip and knee flexion and step length right LE; patient requires assistance with all exercises to perform through full ROM and with correct intensity and with proper alignment of hip/knee right LE           PT Education - 09/26/14 1145    Education provided Yes   Education Details continue with home exercises and increase walking in the home, discussed aquatic therapy that she is to begin next week   Person(s) Educated Patient   Methods Explanation   Comprehension Verbalized understanding             PT Long Term Goals - 09/10/14 1130    PT LONG TERM GOAL #1   Title Patient will demonstrate improved function with daily tasks involving right LE as indicated by LEFS score of 30/64 or better by 10/17/2014   Baseline 8/64 (severe self perceived disabiltiy) current 19/64   Status Revised   PT LONG TERM GOAL #2   Title Patient will improve 10 MW score to 13 seconds or less indicating improved community ambulation and  decrease fall risk by 10/17/2014   Baseline 16 seconds   Status Revised   PT LONG TERM GOAL #3   Title Patient will be able to ambulate without assistive device for short household ambulation and appropriate assistive device outside the home for more independence in the community by 11/06/2014   Baseline Patient is ambulating within one room a few steps without assistive device   Status Partially Met   PT LONG TERM GOAL #4   Title Patient will improve LEFS to 40/64 or better by 09/11/2014 demonstrating improved function with daily tasks involving LE   Baseline 8/64   Status Not Met   PT LONG TERM GOAL #5   Title Patient will improve 10MW to 10 seconds or less demonstrating improved community ambulation and decreased  for falls by 11/06/2014   Baseline 10MW = 16 seconds   Status Revised   PT LONG TERM GOAL #6   Title Patient will be independent with home program without verbal cuing and be able to self manage symptoms and exercises by 11/06/2014   Baseline Patient has limited knowledg of appropriate exercises and progression to improve function and self manage symptoms at home   Status On-going               Plan - 09/26/14 1200    Clinical Impression Statement Patient tolerated session well with improved endurance and strength noted with all exercises today. Patient will begin aquatic therapy next week following MD visit on 09/30/2014. She should do well with the bouyancy assisting her and is expected to improve strength and endurance in trunk and LE's to imoprove abiltiy to walk with less difficulty on land.    Pt will benefit from skilled therapeutic intervention in order to improve on the following deficits Difficulty walking;Decreased endurance;Pain;Decreased strength   PT Frequency 2x / week   PT Duration 8 weeks   PT Treatment/Interventions Patient/family education;Therapeutic exercise;Manual techniques;Moist Heat;Cryotherapy        Problem List Patient Active Problem List    Diagnosis Date Noted  . Skin lesion of chest wall 07/17/2014  . Breast cancer 03/07/2013  . Personal history of malignant neoplasm of large intestine   . 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 Cancer 12/24/2009  . ANXIETY 06/11/2008  . CONSTIPATION, CHRONIC 06/11/2008  . FIBROMYALGIA 06/11/2008    Jomarie Longs PT 09/26/2014, 6:46 PM  [facility] [place] [address], [place], [address] Phone: [phone]   Fax:  [phone]

## 2014-09-30 DIAGNOSIS — Z96641 Presence of right artificial hip joint: Secondary | ICD-10-CM | POA: Diagnosis not present

## 2014-09-30 DIAGNOSIS — Z471 Aftercare following joint replacement surgery: Secondary | ICD-10-CM | POA: Diagnosis not present

## 2014-09-30 DIAGNOSIS — M8580 Other specified disorders of bone density and structure, unspecified site: Secondary | ICD-10-CM | POA: Diagnosis not present

## 2014-10-01 ENCOUNTER — Ambulatory Visit: Payer: Medicare Other | Admitting: Physical Therapy

## 2014-10-01 ENCOUNTER — Encounter: Payer: Self-pay | Admitting: Physical Therapy

## 2014-10-01 DIAGNOSIS — M6281 Muscle weakness (generalized): Secondary | ICD-10-CM

## 2014-10-01 DIAGNOSIS — R262 Difficulty in walking, not elsewhere classified: Secondary | ICD-10-CM

## 2014-10-01 DIAGNOSIS — M25551 Pain in right hip: Secondary | ICD-10-CM | POA: Diagnosis not present

## 2014-10-01 DIAGNOSIS — M25659 Stiffness of unspecified hip, not elsewhere classified: Secondary | ICD-10-CM | POA: Diagnosis not present

## 2014-10-01 NOTE — Therapy (Signed)
[place] [facility] [address], [place], [address] Phone: [phone]   Fax:  [phone]  Physical Therapy Treatment  Patient Details  Name: Terri Perez MRN: [id number] Date of Birth: 04-24-1945 Referring Provider:  Vela Prose, MD  Encounter Date: 10/01/2014      PT End of Session - 10/01/14 1205    Visit Number 18   Number of Visits 28   Date for PT Re-Evaluation 11/06/14   Authorization Type 18   Authorization Time Period 20   PT Start Time 1123   PT Stop Time 1200   PT Time Calculation (min) 37 min   Activity Tolerance Patient limited by fatigue;Patient tolerated treatment well   Behavior During Therapy [facility] for tasks assessed/performed      Past Medical History  Diagnosis Date  . Personal history of fibromyalgia 2002  . Unspecified constipation   . H/O cystitis 2011  . Special screening for malignant neoplasms, colon   . Arm fracture     right forearm, d/t fall  . Personal history of malignant neoplasm of large intestine   . Bowel trouble 2010  . Cancer 12/2009    Left UOQ breast tumor; wide excision,sn bx and whole breast radiation. Chemotherapy done. There was only a 82m area of residual tumor remaining. All sn were negative. This was ER-positive, PR-negative, HER-2 neu not over expressed tumor. The original ultrasound size was 2.6 cm(T2).  . 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 On wide excision 3 mm area of residual tumor. Sentinel node negative. ER-30%, PR-negative, HER-2 not overexpressing.  . Breast cancer 12/2009    left, chemo and radiation    Past Surgical History  Procedure Laterality Date  . Colonoscopy  2010    Dr. WAllen Norris normal  . Portacath placement  2011  . Port-a-cath removal  2013  . Breast surgery Left 2011    wide excision  . Breast biopsy Left 2011  . Breast cyst  aspiration Left 1985  . Skin cancer excision  1980    face  . Tubal ligation  1973  . Abdominal hysterectomy  1974  . Back surgery  1985    bone spurs between 5&6, used bone from left hip  . [facility]  . Femur fracture surgery Right March 2016  . Joint replacement Bilateral Jan 2016 and March 2016    hip    There were no vitals filed for this visit.  Visit Diagnosis:  Difficulty walking  Muscle weakness of lower extremity  Pain in right hip      Subjective Assessment - 10/01/14 1127    Subjective Patient reports going to MD and is healing well with right LE. She is tired today from all the activity yesterday.    Patient Stated Goals She wants to get her "right leg in motion"   Currently in Pain? No/denies  Paitent reports no pain just sore and stiff and tired today.       Objective: Gait: slow cadence, ambulating with rollator with short step lengths, decreased push off on right LE,  Strength: decreased right hip ER and abduction 3+/5 as compared to left hip          [facility] Adult PT Treatment/Exercise - 10/01/14 1210    Exercises   Exercises Other Exercises   Other Exercises  sitting on treatment  table: AAROM right knee flexion/extension isometric knee flexion with assistance multiple angles x 3 sets with 5 second holds, sit to stand from high table (~25") 3 x 5 reps with miniimal assistance of therapist just for contact with left hand,  isometric hip abduction with ER in sitting x 10 reps with graded manuall resistance (moderate), TUG 35.5 seconds, 10 MW: 19 seconds, 5x sit to stand 36 seconds   Manual Therapy   Manual Therapy Soft tissue mobilization   Soft tissue mobilization with patient seated on treatemnt table: STM performed superficial and deep techniques to lateral aspect of thigh and along quadriceps attachment to attachment, patella mobilization performed with patella mobilizer x 2 sets      Patient response to treatment: improved  ability to walk with less pain and stiffness in right hip and LE following STM and exercises, required minimal contact guarding for sit to stand        PT Education - 10/01/14 1155    Education provided Yes   Education Details Reinforced the benefit of aquatic therapy exercises to improve strength in order for her to improve function on land with walking and exercises   Person(s) Educated Patient   Methods Explanation   Comprehension Verbalized understanding             PT Long Term Goals - 09/10/14 1130    PT LONG TERM GOAL #1   Title Patient will demonstrate improved function with daily tasks involving right LE as indicated by LEFS score of 30/64 or better by 10/17/2014   Baseline 8/64 (severe self perceived disabiltiy) current 19/64   Status Revised   PT LONG TERM GOAL #2   Title Patient will improve 10 MW score to 13 seconds or less indicating improved community ambulation and decrease fall risk by 10/17/2014   Baseline 16 seconds   Status Revised   PT LONG TERM GOAL #3   Title Patient will be able to ambulate without assistive device for short household ambulation and appropriate assistive device outside the home for more independence in the community by 11/06/2014   Baseline Patient is ambulating within one room a few steps without assistive device   Status Partially Met   PT LONG TERM GOAL #4   Title Patient will improve LEFS to 40/64 or better by 09/11/2014 demonstrating improved function with daily tasks involving LE   Baseline 8/64   Status Not Met   PT LONG TERM GOAL #5   Title Patient will improve 10MW to 10 seconds or less demonstrating improved community ambulation and decreased for falls by 11/06/2014   Baseline 10MW = 16 seconds   Status Revised   PT LONG TERM GOAL #6   Title Patient will be independent with home program without verbal cuing and be able to self manage symptoms and exercises by 11/06/2014   Baseline Patient has limited knowledg of appropriate  exercises and progression to improve function and self manage symptoms at home   Status On-going               Plan - 10/01/14 1200    Clinical Impression Statement Patient reported improved ability to walk with less stiffenss and sorenss in hips following treatment. She demosntrated decresaed strength and endurnace with TUG, 10MW and sit to stand tests and will require additional physical therapy intervention to imrpove strength and endurance for walking and daily tasks at hom and in community.    Pt will benefit from skilled therapeutic intervention in order to improve  on the following deficits Difficulty walking;Decreased endurance;Pain;Decreased strength   Rehab Potential Good   PT Frequency 3x / week  2x aquatic therapy and 1x/week for land exercises/manual techniques   PT Duration 8 weeks   PT Treatment/Interventions Patient/family education;Therapeutic exercise;Manual techniques;Moist Heat;Cryotherapy   PT Next Visit Plan aquatic therapy and manual therapy to help with spasms, and pain, therapeutic exercise for strength and endurance        Problem List Patient Active Problem List   Diagnosis Date Noted  . Skin lesion of chest wall 07/17/2014  . Breast cancer 03/07/2013  . Personal history of malignant neoplasm of large intestine   . 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 Cancer 12/24/2009  . ANXIETY 06/11/2008  . CONSTIPATION, CHRONIC 06/11/2008  . FIBROMYALGIA 06/11/2008    Jomarie Longs PT 10/01/2014, 2:49 PM  [place] [address] [place], [address] Phone: [phone]   Fax:  [phone]

## 2014-10-02 ENCOUNTER — Ambulatory Visit: Payer: Medicare Other | Admitting: Physical Therapy

## 2014-10-02 DIAGNOSIS — M25659 Stiffness of unspecified hip, not elsewhere classified: Secondary | ICD-10-CM | POA: Diagnosis not present

## 2014-10-02 DIAGNOSIS — M6281 Muscle weakness (generalized): Secondary | ICD-10-CM | POA: Diagnosis not present

## 2014-10-02 DIAGNOSIS — R262 Difficulty in walking, not elsewhere classified: Secondary | ICD-10-CM | POA: Diagnosis not present

## 2014-10-02 DIAGNOSIS — M25551 Pain in right hip: Secondary | ICD-10-CM

## 2014-10-02 NOTE — Therapy (Signed)
[facility] [address], [place], [address] Phone: [phone]   Fax:  [phone]  Physical Therapy Treatment  Patient Details  Name: Terri Perez MRN: [id number] Date of Birth: 11-14-1945 Referring Provider:  Vela Prose, MD  Encounter Date: 10/02/2014      PT End of Session - 10/02/14 0956    Visit Number 19   Number of Visits 28   Date for PT Re-Evaluation 11/06/14   Authorization Type 10   Authorization Time Period 20   PT Start Time 0900   PT Stop Time 0940   PT Time Calculation (min) 40 min   Activity Tolerance Patient tolerated treatment well;No increased pain;Patient limited by pain   Behavior During Therapy [facility] for tasks assessed/performed      Past Medical History  Diagnosis Date  . Personal history of fibromyalgia 2002  . Unspecified constipation   . H/O cystitis 2011  . Special screening for malignant neoplasms, colon   . Arm fracture     right forearm, d/t fall  . Personal history of malignant neoplasm of large intestine   . Bowel trouble 2010  . Cancer 12/2009    Left UOQ breast tumor; wide excision,sn bx and whole breast radiation. Chemotherapy done. There was only a 69m area of residual tumor remaining. All sn were negative. This was ER-positive, PR-negative, HER-2 neu not over expressed tumor. The original ultrasound size was 2.6 cm(T2).  . 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 On wide excision 3 mm area of residual tumor. Sentinel node negative. ER-30%, PR-negative, HER-2 not overexpressing.  . Breast cancer 12/2009    left, chemo and radiation    Past Surgical History  Procedure Laterality Date  . Colonoscopy  2010    Dr. WAllen Norris normal  . Portacath placement  2011  . Port-a-cath removal  2013  . Breast surgery Left 2011    wide excision  . Breast biopsy Left 2011  .  Breast cyst aspiration Left 1985  . Skin cancer excision  1980    face  . Tubal ligation  1973  . Abdominal hysterectomy  1974  . Back surgery  1985    bone spurs between 5&6, used bone from left hip  . [facility]  . Femur fracture surgery Right March 2016  . Joint replacement Bilateral Jan 2016 and March 2016    hip    There were no vitals filed for this visit.  Visit Diagnosis:  Difficulty walking  Muscle weakness of lower extremity  Pain in right hip      Subjective Assessment - 10/02/14 0952    Subjective Patient reports feeling sick on her stomach this am initially with aquatic therapy.    Pertinent History Patient reports she fell in the Fall 2015 and then fractured right hip with THR Fall 2015 and while she was recuperating from this she fell and fractured her femur and underwent right hip revision and rod in right femur 04/04/2014. She was seen by home health physical therapy x 8 weeks and is now referred to out patient physical therapy for continued rehabiliation.    Limitations Sitting;Walking;House hold activities;Other (comment)   How long can you sit comfortably? > 1 hour   How long can you stand comfortably? 30 min.    How long can you walk comfortably? 10 min.  Patient Stated Goals She wants to get her "right leg in motion"   Currently in Pain? No/denies                     Adult Aquatic Therapy - 10/02/14 0954    Aquatic Therapy Subjective   Subjective Patient reports feeling stiff in hips and back. Reports feeling sick to her stomach initially with pool therapy.    Treatment   Gait Entered/exited pool via ramp. Needed w/c assist for last 5 feet of ramp when exiting. Pt ambulated 1 lap with noodle forward. Then performed forward, backward and sidestepping along rail for 3 laps each.    Exercises Standing marching, heel raises, hip extension, knee flexion and hip abduction x 1 min each            [facility] Adult PT  Treatment/Exercise - 10/01/14 1210    Exercises   Exercises Other Exercises   Other Exercises  sitting on treatment table: AAROM right knee flexion/extension isometric knee flexion with assistance multiple angles x 3 sets with 5 second holds, sit to stand  from high table (~25") 3 x 5 reps with miniimal assistance of therapist for proper weight shift with ball betweeen knees,  knee extension with 4# weight on ankle 2 x 15 reps, isometric hip abduction with ER in sitting x 10 reps with graded manuall resistance (moderate),  walk side stepping on balance beam (foam) x 3 reps (~ 5 stesp to right and left) with close supervision/contact guard of therapist for safety and blance    Manual Therapy   Manual Therapy Soft tissue mobilization   Soft tissue mobilization with patient seated on treatemnt table: STM performed superficial and deep techniques to lower back right side including QL and glteal muscles, lateral aspect of thigh and along quadriceps attachment to attachment, patella mobilization performed with patella mobilizer x 2 sets                PT Education - 10/02/14 0955    Education provided Yes   Education Details aquatic therapy poc, treatment and progression. Safety.    Person(s) Educated Patient   Methods Explanation   Comprehension Verbalized understanding             PT Long Term Goals - 09/10/14 1130    PT LONG TERM GOAL #1   Title Patient will demonstrate improved function with daily tasks involving right LE as indicated by LEFS score of 30/64 or better by 10/17/2014   Baseline 8/64 (severe self perceived disabiltiy) current 19/64   Status Revised   PT LONG TERM GOAL #2   Title Patient will improve 10 MW score to 13 seconds or less indicating improved community ambulation and decrease fall risk by 10/17/2014   Baseline 16 seconds   Status Revised   PT LONG TERM GOAL #3   Title Patient will be able to ambulate without assistive device for short household ambulation  and appropriate assistive device outside the home for more independence in the community by 11/06/2014   Baseline Patient is ambulating within one room a few steps without assistive device   Status Partially Met   PT LONG TERM GOAL #4   Title Patient will improve LEFS to 40/64 or better by 09/11/2014 demonstrating improved function with daily tasks involving LE   Baseline 8/64   Status Not Met   PT LONG TERM GOAL #5   Title Patient will improve 10MW to 10 seconds or less demonstrating improved community  ambulation and decreased for falls by 11/06/2014   Baseline 10MW = 16 seconds   Status Revised   PT LONG TERM GOAL #6   Title Patient will be independent with home program without verbal cuing and be able to self manage symptoms and exercises by 11/06/2014   Baseline Patient has limited knowledg of appropriate exercises and progression to improve function and self manage symptoms at home   Status On-going               Plan - 10/02/14 0957    Clinical Impression Statement Patient reports that she feels good in the water. Improved B LE mobility felt by patient and observed.    Pt will benefit from skilled therapeutic intervention in order to improve on the following deficits Difficulty walking;Decreased endurance;Pain;Decreased strength   Rehab Potential Good   Clinical Impairments Affecting Rehab Potential (+) motivated, family support   (-) multiple comorbidities: history of CA, colon resection, THR and subsequent fall with femur fracture and THR revision    PT Frequency 3x / week   PT Duration 8 weeks   PT Treatment/Interventions Patient/family education;Therapeutic exercise;Manual techniques;Moist Heat;Cryotherapy   PT Next Visit Plan aquatic therapy and manual therapy to help with spasms, and pain, therapeutic exercise for strength and endurance   PT Home Exercise Plan added hip ER AAROM with instructions to husband   Consulted and Agree with Plan of Care Patient         Problem List Patient Active Problem List   Diagnosis Date Noted  . Skin lesion of chest wall 07/17/2014  . Breast cancer 03/07/2013  . Personal history of malignant neoplasm of large intestine   . 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 Cancer 12/24/2009  . ANXIETY 06/11/2008  . CONSTIPATION, CHRONIC 06/11/2008  . FIBROMYALGIA 06/11/2008    Cranston Koors, PT, MPT 10/02/2014, 9:59 AM  [place] [address], [place], [address] Phone: [phone]   Fax:  [phone]

## 2014-10-03 ENCOUNTER — Encounter: Payer: Self-pay | Admitting: Physical Therapy

## 2014-10-03 ENCOUNTER — Ambulatory Visit: Payer: Medicare Other | Admitting: Physical Therapy

## 2014-10-03 DIAGNOSIS — M25551 Pain in right hip: Secondary | ICD-10-CM | POA: Diagnosis not present

## 2014-10-03 DIAGNOSIS — M25659 Stiffness of unspecified hip, not elsewhere classified: Secondary | ICD-10-CM | POA: Diagnosis not present

## 2014-10-03 DIAGNOSIS — M6281 Muscle weakness (generalized): Secondary | ICD-10-CM

## 2014-10-03 DIAGNOSIS — R262 Difficulty in walking, not elsewhere classified: Secondary | ICD-10-CM

## 2014-10-03 NOTE — Therapy (Signed)
[facility] [address] [place], [address] Phone: [phone]   Fax:  [phone]  Physical Therapy Treatment  Patient Details  Name: Terri Perez MRN: [id number] Date of Birth: May 21, 1945 Referring Provider:  Vela Prose, MD  Encounter Date: 10/03/2014      PT End of Session - 10/03/14 1200    Visit Number 20   Number of Visits 28   Date for PT Re-Evaluation 11/06/14   Authorization Type 20   Authorization Time Period 20   PT Start Time 1114   PT Stop Time 1145   PT Time Calculation (min) 31 min   Activity Tolerance Patient tolerated treatment well;No increased pain;Patient limited by pain   Behavior During Therapy [facility] for tasks assessed/performed      Past Medical History  Diagnosis Date  . Personal history of fibromyalgia 2002  . Unspecified constipation   . H/O cystitis 2011  . Special screening for malignant neoplasms, colon   . Arm fracture     right forearm, d/t fall  . Personal history of malignant neoplasm of large intestine   . Bowel trouble 2010  . Cancer 12/2009    Left UOQ breast tumor; wide excision,sn bx and whole breast radiation. Chemotherapy done. There was only a 70m area of residual tumor remaining. All sn were negative. This was ER-positive, PR-negative, HER-2 neu not over expressed tumor. The original ultrasound size was 2.6 cm(T2).  . 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 On wide excision 3 mm area of residual tumor. Sentinel node negative. ER-30%, PR-negative, HER-2 not overexpressing.  . Breast cancer 12/2009    left, chemo and radiation    Past Surgical History  Procedure Laterality Date  . Colonoscopy  2010    Dr. WAllen Norris normal  . Portacath placement  2011  . Port-a-cath removal  2013  . Breast surgery Left 2011    wide excision  . Breast biopsy Left 2011  .  Breast cyst aspiration Left 1985  . Skin cancer excision  1980    face  . Tubal ligation  1973  . Abdominal hysterectomy  1974  . Back surgery  1985    bone spurs between 5&6, used bone from left hip  . [facility]  . Femur fracture surgery Right March 2016  . Joint replacement Bilateral Jan 2016 and March 2016    hip    There were no vitals filed for this visit.  Visit Diagnosis:  Difficulty walking  Muscle weakness of lower extremity      Subjective Assessment - 10/03/14 1115    Subjective Patient reports she did well in the water and was able to move her right leg much easier in the water. She is looking forward to more exercise in the water and requests change in her land therapy schedule to Fridays.    Patient Stated Goals She wants to get her "right leg in motion"   Currently in Pain? No/denies       Objective: Gait: slow cadence, ambulating with rollator with short step lengths, decreased push off on right LE,  Strength: decreased right hip ER and abduction 3+/5 as compared to left hip Outcome measures: TUG 35.5 seconds, 10 MW: 19 seconds, 5x sit to stand 36 seconds Palpation; decreased sof ttissue elastien  [place] Adult PT Treatment/Exercise - 10/03/14 1340    Exercises   Exercises Other Exercises   Other Exercises  sitting on treatment table: AAROM right knee flexion/extension isometric knee flexion with assistance multiple angles x 3 sets with 5 second holds, sit to stand  from high table (~21") 3 x 5 reps with miniimal assistance of therapist for proper weight shift,  knee extension with 4# weight on ankle 2 x 15 reps, isometric hip abduction with ER in sitting x 10 reps with graded manuall resistance (moderate)      Manual Therapy   Manual Therapy Soft tissue mobilization   Soft tissue mobilization with patient seated on treatment table: STM performed superficial and deep techniques to lateral aspect of thigh and along  quadriceps attachment to attachment, patella mobilization performed with patella mobilizer x 2 sets     Patient response to treatment: improved ability to transfer sit to stand with less difficulty following repetition and with minimal assistance of therapist, improved gait pattern with increased cadence and step length right LE; patient reported her right LE felt looser and it was easier to walk following treatment, patient did fatigue with knee extension exercise.          PT Education - 10/03/14 1200    Education provided Yes   Education Details proper alingment of hip and knees with proper weight shift to perform sit to stand with less difficulty and with improved control   Person(s) Educated Patient   Methods Explanation;Demonstration;Verbal cues   Comprehension Verbalized understanding;Returned demonstration;Verbal cues required             PT Long Term Goals - 10/03/14 1200    PT LONG TERM GOAL #1   Title Patient will demonstrate improved function with daily tasks involving right LE as indicated by LEFS score of 30/64 or better by 10/17/2014   Baseline 8/64 (severe self perceived disabiltiy) current 19/64   Status On-going   PT LONG TERM GOAL #2   Title Patient will improve 10 MW score to 13 seconds or less indicating improved community ambulation and decrease fall risk by 10/17/2014   Baseline 16 seconds   Status On-going   PT LONG TERM GOAL #3   Title Patient will be able to ambulate without assistive device for short household ambulation and appropriate assistive device outside the home for more independence in the community by 11/06/2014   Baseline Patient is ambulating within one room a few steps without assistive device   Status On-going   PT LONG TERM GOAL #4   Title Patient will improve LEFS to 40/64 or better by 09/11/2014 demonstrating improved function with daily tasks involving LE   Baseline 8/64   Status On-going   PT LONG TERM GOAL #5   Title Patient will  improve 10MW to 10 seconds or less demonstrating improved community ambulation and decreased for falls by 11/06/2014   Baseline 10MW = 16 seconds   Status On-going   PT LONG TERM GOAL #6   Title Patient will be independent with home program without verbal cuing and be able to self manage symptoms and exercises by 11/06/2014   Baseline Patient has limited knowledg of appropriate exercises and progression to improve function and self manage symptoms at home   Status On-going               Plan - 10/03/14 1200    Clinical Impression Statement Patient is progressing with strength as demonstrated with improved control with sit to stand  with verbal cuing and assistance for balance. She conitnues with weakness in right LE s/p injury and surgery and did well in water with exercises. she is expected to continue to improve strength and endurance in order to progress towards goal of walking wihtout assistive device and return to prior level of funciton without difficulty.   Pt will benefit from skilled therapeutic intervention in order to improve on the following deficits Difficulty walking;Decreased endurance;Pain;Decreased strength   Rehab Potential Good   PT Frequency 3x / week   PT Duration 8 weeks   PT Treatment/Interventions Patient/family education;Therapeutic exercise;Manual techniques;Moist Heat;Cryotherapy   PT Next Visit Plan aquatic therapy and manual therapy to help with spasms, and pain, therapeutic exercise for strength and endurance          G-Codes - Oct 12, 2014 1200    Functional Assessment Tool Used LEFS, 10MW, clinical judgment, pain scale, strength deficits, ROM deficits, TUG   Functional Limitation Mobility: Walking and moving around   Mobility: Walking and Moving Around Current Status (V5001) At least 40 percent but less than 60 percent impaired, limited or restricted   Mobility: Walking and Moving Around Goal Status [phone]) At least 20 percent but less than 40 percent  impaired, limited or restricted      Problem List Patient Active Problem List   Diagnosis Date Noted  . Skin lesion of chest wall 07/17/2014  . Breast cancer 03/07/2013  . Personal history of malignant neoplasm of large intestine   . 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 Cancer 12/24/2009  . ANXIETY 06/11/2008  . CONSTIPATION, CHRONIC 06/11/2008  . FIBROMYALGIA 06/11/2008    Jomarie Longs PT 10/12/2014, 2:17 PM  [facility] [address], [place], [address] Phone: [phone]   Fax:  [phone]

## 2014-10-06 ENCOUNTER — Ambulatory Visit: Payer: Medicare Other | Admitting: Physical Therapy

## 2014-10-07 ENCOUNTER — Ambulatory Visit: Payer: Medicare Other | Admitting: Physical Therapy

## 2014-10-07 DIAGNOSIS — M25551 Pain in right hip: Secondary | ICD-10-CM | POA: Diagnosis not present

## 2014-10-07 DIAGNOSIS — M25659 Stiffness of unspecified hip, not elsewhere classified: Secondary | ICD-10-CM | POA: Diagnosis not present

## 2014-10-07 DIAGNOSIS — M6281 Muscle weakness (generalized): Secondary | ICD-10-CM | POA: Diagnosis not present

## 2014-10-07 DIAGNOSIS — R262 Difficulty in walking, not elsewhere classified: Secondary | ICD-10-CM

## 2014-10-07 NOTE — Therapy (Signed)
[facility] [address] [place], [address] Phone: [phone]   Fax:  [phone]  Physical Therapy Treatment  Patient Details  Name: Terri Perez MRN: [id number] Date of Birth: Dec 17, 1945 Referring Provider:  Vela Prose, MD  Encounter Date: 10/07/2014      PT End of Session - 10/07/14 1108    Visit Number 21   Number of Visits 28   Date for PT Re-Evaluation 11/06/14   PT Start Time 0950   PT Stop Time 3893   PT Time Calculation (min) 45 min   Activity Tolerance Patient tolerated treatment well;No increased pain   Behavior During Therapy [facility] for tasks assessed/performed      Past Medical History  Diagnosis Date  . Personal history of fibromyalgia 2002  . Unspecified constipation   . H/O cystitis 2011  . Special screening for malignant neoplasms, colon   . Arm fracture     right forearm, d/t fall  . Personal history of malignant neoplasm of large intestine   . Bowel trouble 2010  . Cancer 12/2009    Left UOQ breast tumor; wide excision,sn bx and whole breast radiation. Chemotherapy done. There was only a 29mm area of residual tumor remaining. All sn were negative. This was ER-positive, PR-negative, HER-2 neu not over expressed tumor. The original ultrasound size was 2.6 cm(T2).  . 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 On wide excision 3 mm area of residual tumor. Sentinel node negative. ER-30%, PR-negative, HER-2 not overexpressing.  . Breast cancer 12/2009    left, chemo and radiation    Past Surgical History  Procedure Laterality Date  . Colonoscopy  2010    Dr. Allen Norris, normal  . Portacath placement  2011  . Port-a-cath removal  2013  . Breast surgery Left 2011    wide excision  . Breast biopsy Left 2011  . Breast cyst aspiration Left 1985  . Skin cancer excision  1980    face  . Tubal  ligation  1973  . Abdominal hysterectomy  1974  . Back surgery  1985    bone spurs between 5&6, used bone from left hip  . [facility]  Sept 2014    [facility]   . Femur fracture surgery Right March 2016  . Joint replacement Bilateral Jan 2016 and March 2016    hip    There were no vitals filed for this visit.  Visit Diagnosis:  Difficulty walking  Muscle weakness of lower extremity      Subjective Assessment - 10/07/14 1105    Subjective Patient reports feeling stiff, says she may have over done it at home walking laps.    Patient is accompained by: Family member   Limitations Sitting;Walking;House hold activities;Other (comment)   Patient Stated Goals She wants to get her "right leg in motion"   Currently in Pain? No/denies                     Adult Aquatic Therapy - 10/07/14 1106    Aquatic Therapy Subjective   Subjective Reports stiffness in right hip. She feels like she may have over done it at home.    Treatment   Gait Entered/exited pool via ramp with handrail assist.  Perrformed 1 lap with noodle support across pool. 3 laps of side stepping at bar in shallow end, 3 laps of forward/backward walking.  Exercises Standing marching, heel raises, hip extension, knee flexion and hip abduction, hip circles x 1 min each                    PT Education - 10/07/14 1108    Education provided Yes   Education Details proper form with exercises. Cues to stay within comfortable range of motion.    Person(s) Educated Patient   Methods Explanation;Demonstration   Comprehension Verbalized understanding;Returned demonstration             PT Long Term Goals - 10/03/14 1200    PT LONG TERM GOAL #1   Title Patient will demonstrate improved function with daily tasks involving right LE as indicated by LEFS score of 30/64 or better by 10/17/2014   Baseline 8/64 (severe self perceived disabiltiy) current 19/64   Status On-going   PT LONG TERM GOAL #2    Title Patient will improve 10 MW score to 13 seconds or less indicating improved community ambulation and decrease fall risk by 10/17/2014   Baseline 16 seconds   Status On-going   PT LONG TERM GOAL #3   Title Patient will be able to ambulate without assistive device for short household ambulation and appropriate assistive device outside the home for more independence in the community by 11/06/2014   Baseline Patient is ambulating within one room a few steps without assistive device   Status On-going   PT LONG TERM GOAL #4   Title Patient will improve LEFS to 40/64 or better by 09/11/2014 demonstrating improved function with daily tasks involving LE   Baseline 8/64   Status On-going   PT LONG TERM GOAL #5   Title Patient will improve 10MW to 10 seconds or less demonstrating improved community ambulation and decreased for falls by 11/06/2014   Baseline 10MW = 16 seconds   Status On-going   PT LONG TERM GOAL #6   Title Patient will be independent with home program without verbal cuing and be able to self manage symptoms and exercises by 11/06/2014   Baseline Patient has limited knowledg of appropriate exercises and progression to improve function and self manage symptoms at home   Status On-going               Plan - 10/07/14 1110    Clinical Impression Statement Patient is progressing with aquatic therapy. Able to exit pool this session without w/c assist. Continues to be limited by fatigue.    Pt will benefit from skilled therapeutic intervention in order to improve on the following deficits Difficulty walking;Decreased endurance;Pain;Decreased strength   Rehab Potential Good   Clinical Impairments Affecting Rehab Potential (+) motivated, family support   (-) multiple comorbidities: history of CA, colon resection, THR and subsequent fall with femur fracture and THR revision    PT Frequency 3x / week   PT Duration 8 weeks   PT Treatment/Interventions Patient/family  education;Therapeutic exercise;Manual techniques;Moist Heat;Cryotherapy;Aquatic Therapy   PT Next Visit Plan aquatic therapy and manual therapy to help with spasms, and pain, therapeutic exercise for strength and endurance   Consulted and Agree with Plan of Care Patient        Problem List Patient Active Problem List   Diagnosis Date Noted  . Skin lesion of chest wall 07/17/2014  . Breast cancer 03/07/2013  . Personal history of malignant neoplasm of large intestine   . 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 Cancer 12/24/2009  . ANXIETY 06/11/2008  .  CONSTIPATION, CHRONIC 06/11/2008  . FIBROMYALGIA 06/11/2008    Milanie Rosenfield, PT, MPT 10/07/2014, 11:12 AM  [facility] [address], [place], [address] Phone: [phone]   Fax:  [phone]

## 2014-10-08 ENCOUNTER — Encounter: Payer: Medicare Other | Admitting: Physical Therapy

## 2014-10-09 ENCOUNTER — Ambulatory Visit: Payer: Medicare Other | Admitting: Physical Therapy

## 2014-10-09 DIAGNOSIS — R262 Difficulty in walking, not elsewhere classified: Secondary | ICD-10-CM | POA: Diagnosis not present

## 2014-10-09 DIAGNOSIS — M25551 Pain in right hip: Secondary | ICD-10-CM | POA: Diagnosis not present

## 2014-10-09 DIAGNOSIS — M6281 Muscle weakness (generalized): Secondary | ICD-10-CM | POA: Diagnosis not present

## 2014-10-09 DIAGNOSIS — M25659 Stiffness of unspecified hip, not elsewhere classified: Secondary | ICD-10-CM | POA: Diagnosis not present

## 2014-10-09 NOTE — Therapy (Signed)
[facility] [address], [place], [address] Phone: [phone]   Fax:  [phone]  Physical Therapy Treatment  Patient Details  Name: Terri Perez MRN: [id number] Date of Birth: 07-18-1945 Referring Provider:  Vela Prose, MD  Encounter Date: 10/09/2014      PT End of Session - 10/09/14 0957    Visit Number 22   Number of Visits 28   Date for PT Re-Evaluation 11/06/14   PT Start Time 0915   PT Stop Time 0950   PT Time Calculation (min) 35 min   Activity Tolerance Patient tolerated treatment well;No increased pain;Patient limited by fatigue;Patient limited by lethargy   Behavior During Therapy [facility] for tasks assessed/performed;Anxious      Past Medical History  Diagnosis Date  . Personal history of fibromyalgia 2002  . Unspecified constipation   . H/O cystitis 2011  . Special screening for malignant neoplasms, colon   . Arm fracture     right forearm, d/t fall  . Personal history of malignant neoplasm of large intestine   . Bowel trouble 2010  . Cancer 12/2009    Left UOQ breast tumor; wide excision,sn bx and whole breast radiation. Chemotherapy done. There was only a 36m area of residual tumor remaining. All sn were negative. This was ER-positive, PR-negative, HER-2 neu not over expressed tumor. The original ultrasound size was 2.6 cm(T2).  . 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 On wide excision 3 mm area of residual tumor. Sentinel node negative. ER-30%, PR-negative, HER-2 not overexpressing.  . Breast cancer 12/2009    left, chemo and radiation    Past Surgical History  Procedure Laterality Date  . Colonoscopy  2010    Dr. WAllen Norris normal  . Portacath placement  2011  . Port-a-cath removal  2013  . Breast surgery Left 2011    wide excision  . Breast biopsy Left 2011  . Breast cyst aspiration  Left 1985  . Skin cancer excision  1980    face  . Tubal ligation  1973  . Abdominal hysterectomy  1974  . Back surgery  1985    bone spurs between 5&6, used bone from left hip  . Colon [facility]  Sept 2014    [facility]  . Femur fracture surgery Right March 2016  . Joint replacement Bilateral Jan 2016 and March 2016    hip    There were no vitals filed for this visit.  Visit Diagnosis:  Difficulty walking  Muscle weakness of lower extremity      Subjective Assessment - 10/09/14 0953    Subjective Patient reports stiffness this am. Moving slowly.    Pertinent History Patient reports she fell in the Fall 2015 and then fractured right hip with THR Fall 2015 and while she was recuperating from this she fell and fractured her femur and underwent right hip revision and rod in right femur 04/04/2014. She was seen by home health physical therapy x 8 weeks and is now referred to out patient physical therapy for continued rehabiliation.    Limitations Sitting;Walking;House hold activities;Other (comment)   Patient Stated Goals She wants to get her "right leg in motion"   Currently in Pain? No/denies                     Adult Aquatic Therapy - 10/09/14 0954    Aquatic Therapy  Subjective   Subjective Patient reports stiffness, states, "I did better the first day than I am now."   Treatment   Gait Entered/exited pool via ramp with handrail assist.  Perrformed 2 laps forward walking with noodle support across poo l. 3 laps of side stepping at bar in shallow end, 3 laps of forward/backward walking.    Exercises Standing marching, heel raises, hip extension, knee flexion and hip abduction, hip external rotation with hip and knee at 90 degrees,  hip circles x 1 min each     *patient wanted to try belt as opposed to noodle for support this session, but did not prefer it and went back to using noodle.                PT Education - 10/09/14 0956    Education provided Yes    Education Details Cues for proper form. New activities. Safety   Person(s) Educated Patient   Methods Explanation;Demonstration   Comprehension Verbalized understanding;Returned demonstration             PT Long Term Goals - 10/03/14 1200    PT LONG TERM GOAL #1   Title Patient will demonstrate improved function with daily tasks involving right LE as indicated by LEFS score of 30/64 or better by 10/17/2014   Baseline 8/64 (severe self perceived disabiltiy) current 19/64   Status On-going   PT LONG TERM GOAL #2   Title Patient will improve 10 MW score to 13 seconds or less indicating improved community ambulation and decrease fall risk by 10/17/2014   Baseline 16 seconds   Status On-going   PT LONG TERM GOAL #3   Title Patient will be able to ambulate without assistive device for short household ambulation and appropriate assistive device outside the home for more independence in the community by 11/06/2014   Baseline Patient is ambulating within one room a few steps without assistive device   Status On-going   PT LONG TERM GOAL #4   Title Patient will improve LEFS to 40/64 or better by 09/11/2014 demonstrating improved function with daily tasks involving LE   Baseline 8/64   Status On-going   PT LONG TERM GOAL #5   Title Patient will improve 10MW to 10 seconds or less demonstrating improved community ambulation and decreased for falls by 11/06/2014   Baseline 10MW = 16 seconds   Status On-going   PT LONG TERM GOAL #6   Title Patient will be independent with home program without verbal cuing and be able to self manage symptoms and exercises by 11/06/2014   Baseline Patient has limited knowledg of appropriate exercises and progression to improve function and self manage symptoms at home   Status On-going               Plan - 10/09/14 0958    Clinical Impression Statement Patient has anxiety when in pool. Doing well, and able to control her anxiety. Easily fatigued with  aquatic activities.    Pt will benefit from skilled therapeutic intervention in order to improve on the following deficits Difficulty walking;Decreased endurance;Pain;Decreased strength;Impaired flexibility   Rehab Potential Good   Clinical Impairments Affecting Rehab Potential (+) motivated, family support   (-) multiple comorbidities: history of CA, colon resection, THR and subsequent fall with femur fracture and THR revision    PT Frequency 3x / week   PT Duration 8 weeks   PT Treatment/Interventions Patient/family education;Therapeutic exercise;Manual techniques;Moist Heat;Cryotherapy;Aquatic Therapy   PT Next Visit Plan aquatic  therapy and manual therapy to help with spasms, and pain, therapeutic exercise for strength and endurance   Consulted and Agree with Plan of Care Patient        Problem List Patient Active Problem List   Diagnosis Date Noted  . Skin lesion of chest wall 07/17/2014  . Breast cancer 03/07/2013  . Personal history of malignant neoplasm of large intestine   . 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 Cancer 12/24/2009  . ANXIETY 06/11/2008  . CONSTIPATION, CHRONIC 06/11/2008  . FIBROMYALGIA 06/11/2008    Aasia Peavler, PT, MPT 10/09/2014, 10:00 AM  [facility] [address], [place], [address] Phone: [phone]   Fax:  [phone]

## 2014-10-10 ENCOUNTER — Ambulatory Visit: Payer: Medicare Other | Admitting: Physical Therapy

## 2014-10-13 ENCOUNTER — Ambulatory Visit: Payer: Medicare Other | Admitting: Physical Therapy

## 2014-10-13 ENCOUNTER — Telehealth: Payer: Self-pay | Admitting: Family Medicine

## 2014-10-13 NOTE — Telephone Encounter (Signed)
1 by mouth every 4 hours. When necessary pain tramadol 50 mg, #5 refills

## 2014-10-13 NOTE — Telephone Encounter (Signed)
See below please-aa 

## 2014-10-13 NOTE — Telephone Encounter (Signed)
Pt called saying [facility] and she is having a lot of pain.  She wants to know if you can prescribe her tramidol.  She uses [facility] on [address].    Her call back is [phone]  Thanks teri

## 2014-10-14 ENCOUNTER — Telehealth: Payer: Self-pay | Admitting: Family Medicine

## 2014-10-14 ENCOUNTER — Ambulatory Visit: Payer: Medicare Other | Admitting: Physical Therapy

## 2014-10-14 ENCOUNTER — Other Ambulatory Visit: Payer: Self-pay

## 2014-10-14 DIAGNOSIS — M6281 Muscle weakness (generalized): Secondary | ICD-10-CM | POA: Diagnosis not present

## 2014-10-14 DIAGNOSIS — R262 Difficulty in walking, not elsewhere classified: Secondary | ICD-10-CM

## 2014-10-14 DIAGNOSIS — M25659 Stiffness of unspecified hip, not elsewhere classified: Secondary | ICD-10-CM | POA: Diagnosis not present

## 2014-10-14 DIAGNOSIS — M25551 Pain in right hip: Secondary | ICD-10-CM | POA: Diagnosis not present

## 2014-10-14 MED ORDER — TRAMADOL HCL 50 MG PO TABS: 50.0000 mg | ORAL_TABLET | ORAL | Status: DC | PRN

## 2014-10-14 NOTE — Telephone Encounter (Signed)
Advised  ED 

## 2014-10-14 NOTE — Therapy (Signed)
[facility] [address] [place], [address] Phone: [phone]   Fax:  [phone]  Physical Therapy Treatment  Patient Details  Name: Terri Perez MRN: [id number] Date of Birth: 1945/03/31 Referring Provider:  Vela Prose, MD  Encounter Date: 10/14/2014      PT End of Session - 10/14/14 1033    Visit Number 23   Number of Visits 28   Date for PT Re-Evaluation 11/06/14   PT Start Time 0930   PT Stop Time 9767   PT Time Calculation (min) 45 min   Activity Tolerance Patient tolerated treatment well;No increased pain;Patient limited by fatigue   Behavior During Therapy [facility] for tasks assessed/performed      Past Medical History  Diagnosis Date  . Personal history of fibromyalgia 2002  . Unspecified constipation   . H/O cystitis 2011  . Special screening for malignant neoplasms, colon   . Arm fracture     right forearm, d/t fall  . Personal history of malignant neoplasm of large intestine   . Bowel trouble 2010  . Cancer 12/2009    Left UOQ breast tumor; wide excision,sn bx and whole breast radiation. Chemotherapy done. There was only a 35m area of residual tumor remaining. All sn were negative. This was ER-positive, PR-negative, HER-2 neu not over expressed tumor. The original ultrasound size was 2.6 cm(T2).  . 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 On wide excision 3 mm area of residual tumor. Sentinel node negative. ER-30%, PR-negative, HER-2 not overexpressing.  . Breast cancer 12/2009    left, chemo and radiation    Past Surgical History  Procedure Laterality Date  . Colonoscopy  2010    Dr. WAllen Norris normal  . Portacath placement  2011  . Port-a-cath removal  2013  . Breast surgery Left 2011    wide excision  . Breast biopsy Left 2011  . Breast cyst aspiration Left 1985  . Skin cancer excision   1980    face  . Tubal ligation  1973  . Abdominal hysterectomy  1974  . Back surgery  1985    bone spurs between 5&6, used bone from left hip  . [facility]  . Femur fracture surgery Right March 2016  . Joint replacement Bilateral Jan 2016 and March 2016    hip    There were no vitals filed for this visit.  Visit Diagnosis:  Difficulty walking  Muscle weakness of lower extremity  Muscle weakness      Subjective Assessment - 10/14/14 1029    Subjective Patient reports stiffness in B hips.   Patient is accompained by: Family member   Pertinent History Patient reports she fell in the Fall 2015 and then fractured right hip with THR Fall 2015 and while she was recuperating from this she fell and fractured her femur and underwent right hip revision and rod in right femur 04/04/2014. She was seen by home health physical therapy x 8 weeks and is now referred to out patient physical therapy for continued rehabiliation.    Limitations Sitting;Walking;House hold activities;Other (comment)   How long can you sit comfortably? > 1 hour   How long can you stand comfortably? 30 min.    How long can you walk comfortably? 10 min.    Patient Stated Goals She wants to get her "right leg  in motion"   Currently in Pain? No/denies                     Adult Aquatic Therapy - 10/14/14 1030    Aquatic Therapy Subjective   Subjective Patient reports stiffness in B hips and legs.   Treatment   Gait Entered/exited pool via ramp with handrail assist with increased time and effort required as well as close sba for safety.  Perrformed 2 laps forward walking with noodle support across pool with close supervision and assist for balance as needed. 1 lap backward using noodle across pool.    Exercises Standing exercises to inclide marching, hip abduction, hip extension, hip abduction with knee and hip at 90 degrees x 1 min each.    Specific Exercises Hip/Low Back    Hip/Low Back Seated exercises to include, laq, abd/add, and bicycles x 2 min each.                     PT Education - 10/14/14 1033    Education provided Yes   Education Details proper form, new activities. safety   Person(s) Educated Patient   Methods Explanation;Demonstration   Comprehension Verbalized understanding;Returned demonstration;Verbal cues required             PT Long Term Goals - 10/03/14 1200    PT LONG TERM GOAL #1   Title Patient will demonstrate improved function with daily tasks involving right LE as indicated by LEFS score of 30/64 or better by 10/17/2014   Baseline 8/64 (severe self perceived disabiltiy) current 19/64   Status On-going   PT LONG TERM GOAL #2   Title Patient will improve 10 MW score to 13 seconds or less indicating improved community ambulation and decrease fall risk by 10/17/2014   Baseline 16 seconds   Status On-going   PT LONG TERM GOAL #3   Title Patient will be able to ambulate without assistive device for short household ambulation and appropriate assistive device outside the home for more independence in the community by 11/06/2014   Baseline Patient is ambulating within one room a few steps without assistive device   Status On-going   PT LONG TERM GOAL #4   Title Patient will improve LEFS to 40/64 or better by 09/11/2014 demonstrating improved function with daily tasks involving LE   Baseline 8/64   Status On-going   PT LONG TERM GOAL #5   Title Patient will improve 10MW to 10 seconds or less demonstrating improved community ambulation and decreased for falls by 11/06/2014   Baseline 10MW = 16 seconds   Status On-going   PT LONG TERM GOAL #6   Title Patient will be independent with home program without verbal cuing and be able to self manage symptoms and exercises by 11/06/2014   Baseline Patient has limited knowledg of appropriate exercises and progression to improve function and self manage symptoms at home   Status  On-going               Plan - 10/14/14 1034    Clinical Impression Statement Patient has less anxiety with continued aquatic sessions. Able to ambulate across pool with improved balance and decreased assistance needed. Easier time with up/down ramp from initial session. Pt is still limited by fatigue and decreased endurance.    Pt will benefit from skilled therapeutic intervention in order to improve on the following deficits Difficulty walking;Decreased endurance;Pain;Decreased strength;Impaired flexibility   Rehab Potential Good   Clinical Impairments  Affecting Rehab Potential (+) motivated, family support   (-) multiple comorbidities: history of CA, colon resection, THR and subsequent fall with femur fracture and THR revision    PT Frequency 3x / week   PT Duration 8 weeks   PT Treatment/Interventions Patient/family education;Therapeutic exercise;Manual techniques;Moist Heat;Cryotherapy;Aquatic Therapy   PT Next Visit Plan aquatic therapy and manual therapy to help with spasms, and pain, therapeutic exercise for strength and endurance   Consulted and Agree with Plan of Care Patient        Problem List Patient Active Problem List   Diagnosis Date Noted  . Skin lesion of chest wall 07/17/2014  . Breast cancer 03/07/2013  . Personal history of malignant neoplasm of large intestine   . 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 Cancer 12/24/2009  . ANXIETY 06/11/2008  . CONSTIPATION, CHRONIC 06/11/2008  . FIBROMYALGIA 06/11/2008    Lorette Peterkin, PT, MPT, GCS 10/14/2014, 10:36 AM  [facility] [address] [place], [address] Phone: [phone]   Fax:  [phone]

## 2014-10-15 ENCOUNTER — Encounter: Payer: Medicare Other | Admitting: Physical Therapy

## 2014-10-16 ENCOUNTER — Ambulatory Visit: Payer: Medicare Other | Admitting: Physical Therapy

## 2014-10-16 DIAGNOSIS — R262 Difficulty in walking, not elsewhere classified: Secondary | ICD-10-CM

## 2014-10-16 DIAGNOSIS — M6281 Muscle weakness (generalized): Secondary | ICD-10-CM

## 2014-10-16 DIAGNOSIS — M25659 Stiffness of unspecified hip, not elsewhere classified: Secondary | ICD-10-CM | POA: Diagnosis not present

## 2014-10-16 DIAGNOSIS — M25551 Pain in right hip: Secondary | ICD-10-CM | POA: Diagnosis not present

## 2014-10-16 NOTE — Therapy (Signed)
[facility] [address], [place], [address] Phone: [phone]   Fax:  [phone]  Physical Therapy Treatment  Patient Details  Name: Terri Perez MRN: [id number] Date of Birth: 07/26/1945 Referring Abryanna Musolino:  Vela Prose, MD  Encounter Date: 10/16/2014      PT End of Session - 10/16/14 1025    Visit Number 24   Number of Visits 28   Date for PT Re-Evaluation 11/06/14   PT Start Time 0920   PT Stop Time 1005   PT Time Calculation (min) 45 min   Activity Tolerance Patient tolerated treatment well;No increased pain;Patient limited by fatigue   Behavior During Therapy [facility] for tasks assessed/performed      Past Medical History  Diagnosis Date  . Personal history of fibromyalgia 2002  . Unspecified constipation   . H/O cystitis 2011  . Special screening for malignant neoplasms, colon   . Arm fracture     right forearm, d/t fall  . Personal history of malignant neoplasm of large intestine   . Bowel trouble 2010  . Cancer 12/2009    Left UOQ breast tumor; wide excision,sn bx and whole breast radiation. Chemotherapy done. There was only a 61m area of residual tumor remaining. All sn were negative. This was ER-positive, PR-negative, HER-2 neu not over expressed tumor. The original ultrasound size was 2.6 cm(T2).  . 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 On wide excision 3 mm area of residual tumor. Sentinel node negative. ER-30%, PR-negative, HER-2 not overexpressing.  . Breast cancer 12/2009    left, chemo and radiation    Past Surgical History  Procedure Laterality Date  . Colonoscopy  2010    Dr. WAllen Norris normal  . Portacath placement  2011  . Port-a-cath removal  2013  . Breast surgery Left 2011    wide excision  . Breast biopsy Left 2011  . Breast cyst aspiration Left 1985  . Skin cancer excision   1980    face  . Tubal ligation  1973  . Abdominal hysterectomy  1974  . Back surgery  1985    bone spurs between 5&6, used bone from left hip  . [facility]  . Femur fracture surgery Right March 2016  . Joint replacement Bilateral Jan 2016 and March 2016    hip    There were no vitals filed for this visit.  Visit Diagnosis:  Difficulty walking  Muscle weakness of lower extremity      Subjective Assessment - 10/16/14 1022    Subjective Patient states she is feeling okay today.    Patient is accompained by: Family member   Pertinent History Patient reports she fell in the Fall 2015 and then fractured right hip with THR Fall 2015 and while she was recuperating from this she fell and fractured her femur and underwent right hip revision and rod in right femur 04/04/2014. She was seen by home health physical therapy x 8 weeks and is now referred to out patient physical therapy for continued rehabiliation.    How long can you sit comfortably? > 1 hour   How long can you stand comfortably? 30 min.    How long can you walk comfortably? 10 min.    Patient Stated Goals She wants to get her "right leg in motion"   Currently in Pain? No/denies  Adult Aquatic Therapy - 10/16/14 1023    Aquatic Therapy Subjective   Subjective Patient reports she is feeling well today.    Treatment   Gait Entered/exited pool via ramp with handrail assist with increased time and effort required as well as close sba for safety.  Perrformed 3 laps forward walking with noodle support across pool with close supervision and assist for balance as needed. 1 lap backward using noodle across pool.    Exercises Standing exercises to inclide marching, hip abduction, hip extension, hip abduction with knee and hip at 90 degrees x 1 min each.    Specific Exercises Hip/Low Back   Hip/Low Back Seated exercises to include, laq, abd/add, and bicycles x 2 min each.                      PT Education - 10/16/14 1024    Education provided Yes   Education Details safety with moving in water, proper form with exercises.    Person(s) Educated Patient   Methods Explanation   Comprehension Verbalized understanding;Returned demonstration;Verbal cues required             PT Long Term Goals - 10/03/14 1200    PT LONG TERM GOAL #1   Title Patient will demonstrate improved function with daily tasks involving right LE as indicated by LEFS score of 30/64 or better by 10/17/2014   Baseline 8/64 (severe self perceived disabiltiy) current 19/64   Status On-going   PT LONG TERM GOAL #2   Title Patient will improve 10 MW score to 13 seconds or less indicating improved community ambulation and decrease fall risk by 10/17/2014   Baseline 16 seconds   Status On-going   PT LONG TERM GOAL #3   Title Patient will be able to ambulate without assistive device for short household ambulation and appropriate assistive device outside the home for more independence in the community by 11/06/2014   Baseline Patient is ambulating within one room a few steps without assistive device   Status On-going   PT LONG TERM GOAL #4   Title Patient will improve LEFS to 40/64 or better by 09/11/2014 demonstrating improved function with daily tasks involving LE   Baseline 8/64   Status On-going   PT LONG TERM GOAL #5   Title Patient will improve 10MW to 10 seconds or less demonstrating improved community ambulation and decreased for falls by 11/06/2014   Baseline 10MW = 16 seconds   Status On-going   PT LONG TERM GOAL #6   Title Patient will be independent with home program without verbal cuing and be able to self manage symptoms and exercises by 11/06/2014   Baseline Patient has limited knowledg of appropriate exercises and progression to improve function and self manage symptoms at home   Status On-going               Plan - 10/16/14 1025    Clinical Impression  Statement Patient is making good progress with aquatic therapy. She requires less physical assist during activities to maintain balance. Able to navigate ramp with improved speed and ability. Continues to require rest breaks during session.    Pt will benefit from skilled therapeutic intervention in order to improve on the following deficits Difficulty walking;Decreased endurance;Pain;Decreased strength;Impaired flexibility   Rehab Potential Good   Clinical Impairments Affecting Rehab Potential (+) motivated, family support   (-) multiple comorbidities: history of CA, colon resection, THR and subsequent fall with femur fracture and THR  revision    PT Frequency 3x / week   PT Duration 8 weeks   PT Treatment/Interventions Patient/family education;Therapeutic exercise;Manual techniques;Moist Heat;Cryotherapy;Aquatic Therapy   PT Next Visit Plan aquatic therapy and manual therapy to help with spasms, and pain, therapeutic exercise for strength and endurance   Consulted and Agree with Plan of Care Patient        Problem List Patient Active Problem List   Diagnosis Date Noted  . Skin lesion of chest wall 07/17/2014  . Breast cancer 03/07/2013  . Personal history of malignant neoplasm of large intestine   . 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 Cancer 12/24/2009  . ANXIETY 06/11/2008  . CONSTIPATION, CHRONIC 06/11/2008  . FIBROMYALGIA 06/11/2008    Conetta,Kristyn, PT, MPT, GCS 10/16/2014, 10:28 AM  [facility] [address] [place], [address] Phone: [phone]   Fax:  [phone]

## 2014-10-17 ENCOUNTER — Ambulatory Visit: Payer: Medicare Other | Admitting: Physical Therapy

## 2014-10-17 ENCOUNTER — Encounter: Payer: Self-pay | Admitting: Physical Therapy

## 2014-10-17 DIAGNOSIS — R262 Difficulty in walking, not elsewhere classified: Secondary | ICD-10-CM | POA: Diagnosis not present

## 2014-10-17 DIAGNOSIS — M6281 Muscle weakness (generalized): Secondary | ICD-10-CM

## 2014-10-17 DIAGNOSIS — M25551 Pain in right hip: Secondary | ICD-10-CM

## 2014-10-17 DIAGNOSIS — M25659 Stiffness of unspecified hip, not elsewhere classified: Secondary | ICD-10-CM | POA: Diagnosis not present

## 2014-10-17 NOTE — Therapy (Signed)
[facility] [address] [place], [address] Phone: [phone]   Fax:  [phone]  Physical Therapy Treatment  Patient Details  Name: Terri Perez MRN: [id number] Date of Birth: 09-06-1945 Referring Provider:  Vela Prose, MD  Encounter Date: 10/17/2014      PT End of Session - 10/17/14 1527    Visit Number 25   Number of Visits 28   Date for PT Re-Evaluation 11/06/14   Authorization Type 25   Authorization Time Period 30   PT Start Time 1003   PT Stop Time 1045   PT Time Calculation (min) 42 min   Activity Tolerance Patient tolerated treatment well;No increased pain;Patient limited by fatigue   Behavior During Therapy [facility] for tasks assessed/performed      Past Medical History  Diagnosis Date  . Personal history of fibromyalgia 2002  . Unspecified constipation   . H/O cystitis 2011  . Special screening for malignant neoplasms, colon   . Arm fracture     right forearm, d/t fall  . Personal history of malignant neoplasm of large intestine   . Bowel trouble 2010  . Cancer 12/2009    Left UOQ breast tumor; wide excision,sn bx and whole breast radiation. Chemotherapy done. There was only a 72m area of residual tumor remaining. All sn were negative. This was ER-positive, PR-negative, HER-2 neu not over expressed tumor. The original ultrasound size was 2.6 cm(T2).  . 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 On wide excision 3 mm area of residual tumor. Sentinel node negative. ER-30%, PR-negative, HER-2 not overexpressing.  . Breast cancer 12/2009    left, chemo and radiation    Past Surgical History  Procedure Laterality Date  . Colonoscopy  2010    Dr. WAllen Norris normal  . Portacath placement  2011  . Port-a-cath removal  2013  . Breast surgery Left 2011    wide excision  . Breast biopsy Left 2011  .  Breast cyst aspiration Left 1985  . Skin cancer excision  1980    face  . Tubal ligation  1973  . Abdominal hysterectomy  1974  . Back surgery  1985    bone spurs between 5&6, used bone from left hip  . [facility]  . Femur fracture surgery Right March 2016  . Joint replacement Bilateral Jan 2016 and March 2016    hip    There were no vitals filed for this visit.  Visit Diagnosis:  Difficulty walking  Pain in right hip  Muscle weakness      Subjective Assessment - 10/17/14 1004    Subjective Patient reports she is feeling stronger in legs and it is easier to get up out of chairs now because of this. She is exercising in the pool with therapist and feels this has made a signigicant difference in her strength. she reports she is still feeling tightness in upper thigh and groin region and right buttock region.    Limitations Sitting;Walking;House hold activities;Other (comment)   Patient Stated Goals She wants to get her "right leg in motion"   Currently in Pain? No/denies  just stiffness in hips   Multiple Pain Sites No       Outcome measures: 10/03/2014:  TUG 35.5 seconds, 10 MW: 19 seconds, 5x sit to stand 36 seconds; 10/17/2014: 10 MW 14.9 seconds,  TUG 31 seconds, 5x sit to stand (off 21" table) 20 seconds Palpation; decreased sof ttissue elasticity in right quadriceps proximal region and just above patella and along vastus lateralis       [facility] Adult PT Treatment/Exercise - 10/17/14 1012    Exercises   Exercises Other Exercises   Other Exercises  Instructed in different strategies for assisting in rolling from supine to side lying and back and dissociation of upper body and lower body, instructed in trunk rotation, lateral side glide in sitting for trunk control 10 reps each, re assessed TUG, 10 MW and sit to stand     Manual Therapy   Manual Therapy Soft tissue mobilization   Soft tissue mobilization with patient seated on treatemnt table: STM  performed superficial and deep techniques to lateral aspect of thigh and along quadriceps attachment to attachment, patella mobilization performed with patella mobilizer x 2 sets        Patient response to treatment: improved ability to walk with less pain and stiffness in right hip and LE following STM and exercises, required verbal cuing and close supervision to perform all exercises and re assessments, improved results with assessments            PT Education - 10/17/14 1048    Education provided Yes   Education Details instructed in how to roll over in bed with demonstration and explanation with patient verbalizeing understanding    Person(s) Educated Patient   Methods Explanation;Demonstration   Comprehension Verbalized understanding             PT Long Term Goals - 10/17/14 1050    PT LONG TERM GOAL #1   Title Patient will demonstrate improved function with daily tasks involving right LE as indicated by LEFS score of 30/64 or better by 10/17/2014   Baseline 8/64 (severe self perceived disabiltiy) current 19/64   Status Deferred   PT LONG TERM GOAL #2   Title Patient will improve 10 MW score to 13 seconds or less indicating improved community ambulation and decrease fall risk by 10/17/2014   Baseline 16 seconds   Status Partially Met   PT LONG TERM GOAL #3   Title Patient will be able to ambulate without assistive device for short household ambulation and appropriate assistive device outside the home for more independence in the community by 11/06/2014   Baseline Patient is ambulating within one room a few steps without assistive device   Status On-going   PT LONG TERM GOAL #4   Title Patient will improve LEFS to 40/64 or better by 09/11/2014 demonstrating improved function with daily tasks involving LE   Baseline 8/64   Status On-going   PT LONG TERM GOAL #5   Title Patient will improve 10MW to 10 seconds or less demonstrating improved community ambulation and  decreased for falls by 11/06/2014   Baseline 10MW = 16 seconds   Status On-going   PT LONG TERM GOAL #6   Title Patient will be independent with home program without verbal cuing and be able to self manage symptoms and exercises by 11/06/2014   Baseline Patient has limited knowledg of appropriate exercises and progression to improve function and self manage symptoms at home   Status On-going               Plan - 10/17/14 1050    Clinical Impression Statement Patient demonstrates good progress towards goals with improvement with TUG from 35 sconds to 31 seconds, 10MW from 19 to 14.9  seconds and sit to stand x 5 reps from 36 seconds to 20 seconds. She continues with weakness in LE's, decreased control in core and difficulty with walking and performing daily tasks. She requires assistance and cuing to perform exercises and will benefit from continued exercises in water and re assessment in 2 weeks.    Pt will benefit from skilled therapeutic intervention in order to improve on the following deficits Difficulty walking;Decreased endurance;Pain;Decreased strength;Impaired flexibility   Rehab Potential Good   PT Treatment/Interventions Patient/family education;Therapeutic exercise;Manual techniques;Moist Heat;Cryotherapy;Aquatic Therapy   PT Next Visit Plan aquatic therapy and manual therapy to help with spasms, and pain, therapeutic exercise for strength and endurance        Problem List Patient Active Problem List   Diagnosis Date Noted  . Skin lesion of chest wall 07/17/2014  . Breast cancer 03/07/2013  . Personal history of malignant neoplasm of large intestine   . 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 Cancer 12/24/2009  . ANXIETY 06/11/2008  . CONSTIPATION, CHRONIC 06/11/2008  . FIBROMYALGIA 06/11/2008    Jomarie Longs PT 10/17/2014, 11:28 PM  [facility] [address]  [place], [address] Phone: [phone]   Fax:  [phone]

## 2014-10-20 ENCOUNTER — Encounter: Payer: Medicare Other | Admitting: Physical Therapy

## 2014-10-20 DIAGNOSIS — M810 Age-related osteoporosis without current pathological fracture: Secondary | ICD-10-CM | POA: Diagnosis not present

## 2014-10-21 ENCOUNTER — Ambulatory Visit: Payer: Medicare Other | Admitting: Physical Therapy

## 2014-10-21 DIAGNOSIS — R262 Difficulty in walking, not elsewhere classified: Secondary | ICD-10-CM | POA: Diagnosis not present

## 2014-10-21 DIAGNOSIS — M25551 Pain in right hip: Secondary | ICD-10-CM | POA: Diagnosis not present

## 2014-10-21 DIAGNOSIS — M25659 Stiffness of unspecified hip, not elsewhere classified: Secondary | ICD-10-CM

## 2014-10-21 DIAGNOSIS — M6281 Muscle weakness (generalized): Secondary | ICD-10-CM | POA: Diagnosis not present

## 2014-10-21 NOTE — Therapy (Signed)
[place] [facility] [address], [place], [address] Phone: [phone]   Fax:  [phone]  Physical Therapy Treatment  Patient Details  Name: Terri Perez MRN: [id number] Date of Birth: Oct 26, 1945 Referring Provider:  Vela Prose, MD  Encounter Date: 10/21/2014      PT End of Session - 10/21/14 1046    Visit Number 26   Number of Visits 28   Date for PT Re-Evaluation 11/06/14   Authorization Type 26   Authorization Time Period 30   PT Start Time 0930   PT Stop Time 1008   PT Time Calculation (min) 38 min   Activity Tolerance Patient tolerated treatment well;No increased pain;Patient limited by fatigue;Patient limited by lethargy   Behavior During Therapy [facility] for tasks assessed/performed      Past Medical History  Diagnosis Date  . Personal history of fibromyalgia 2002  . Unspecified constipation   . H/O cystitis 2011  . Special screening for malignant neoplasms, colon   . Arm fracture     right forearm, d/t fall  . Personal history of malignant neoplasm of large intestine   . Bowel trouble 2010  . Cancer 12/2009    Left UOQ breast tumor; wide excision,sn bx and whole breast radiation. Chemotherapy done. There was only a 45m area of residual tumor remaining. All sn were negative. This was ER-positive, PR-negative, HER-2 neu not over expressed tumor. The original ultrasound size was 2.6 cm(T2).  . 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 On wide excision 3 mm area of residual tumor. Sentinel node negative. ER-30%, PR-negative, HER-2 not overexpressing.  . Breast cancer 12/2009    left, chemo and radiation    Past Surgical History  Procedure Laterality Date  . Colonoscopy  2010    Dr. WAllen Norris normal  . Portacath placement  2011  . Port-a-cath removal  2013  . Breast surgery Left 2011    wide excision  .  Breast biopsy Left 2011  . Breast cyst aspiration Left 1985  . Skin cancer excision  1980    face  . Tubal ligation  1973  . Abdominal hysterectomy  1974  . Back surgery  1985    bone spurs between 5&6, used bone from left hip  . [facility]  . Femur fracture surgery Right March 2016  . Joint replacement Bilateral Jan 2016 and March 2016    hip    There were no vitals filed for this visit.  Visit Diagnosis:  Difficulty walking  Muscle weakness  Hip stiffness, unspecified laterality      Subjective Assessment - 10/21/14 1042    Subjective Patient reports she feels like this is helping, she continues to report hip tightness. She also reports that the pain has improved and now just reports tightness.    Patient is accompained by: Family member   Pertinent History Patient reports she fell in the Fall 2015 and then fractured right hip with THR Fall 2015 and while she was recuperating from this she fell and fractured her femur and underwent right hip revision and rod in right femur 04/04/2014. She was seen by home health physical therapy x 8 weeks and is now referred to out patient physical therapy for continued rehabiliation.    How long can you sit comfortably? > 1 hour   How long can you  stand comfortably? 30 min.    How long can you walk comfortably? 10 min.    Patient Stated Goals She wants to get her "right leg in motion"   Currently in Pain? No/denies                     Adult Aquatic Therapy - 10/21/14 1044    Aquatic Therapy Subjective   Subjective Continues to report hip tightness.    Treatment   Gait Entered/exited pool via ramp with handrail assist with increased time and effort required as well as close sba for safety.  Perrformed 3 laps forward walking with noodle support across pool with close supervision and assist for balance as needed. I lap backward along wall with rail for balance and 1 lap sidestepping also at wall with  rail for safety.    Exercises Standing exercises to inclide marching, hip abduction, hip extension, hip abduction with knee and hip at 90 degrees x 1 min each.    Specific Exercises Hip/Low Back   Hip/Low Back Seated exercises to include, laq, abd/add, and bicycles x 2 min each.                     PT Education - 10/21/14 1046    Education provided Yes   Education Details benefits of different exercises for leg strengthening and hip stretching.    Person(s) Educated Patient   Methods Explanation   Comprehension Verbalized understanding             PT Long Term Goals - 10/17/14 1050    PT LONG TERM GOAL #1   Title Patient will demonstrate improved function with daily tasks involving right LE as indicated by LEFS score of 30/64 or better by 10/17/2014   Baseline 8/64 (severe self perceived disabiltiy) current 19/64   Status Deferred   PT LONG TERM GOAL #2   Title Patient will improve 10 MW score to 13 seconds or less indicating improved community ambulation and decrease fall risk by 10/17/2014   Baseline 16 seconds   Status Partially Met   PT LONG TERM GOAL #3   Title Patient will be able to ambulate without assistive device for short household ambulation and appropriate assistive device outside the home for more independence in the community by 11/06/2014   Baseline Patient is ambulating within one room a few steps without assistive device   Status On-going   PT LONG TERM GOAL #4   Title Patient will improve LEFS to 40/64 or better by 09/11/2014 demonstrating improved function with daily tasks involving LE   Baseline 8/64   Status On-going   PT LONG TERM GOAL #5   Title Patient will improve 10MW to 10 seconds or less demonstrating improved community ambulation and decreased for falls by 11/06/2014   Baseline 10MW = 16 seconds   Status On-going   PT LONG TERM GOAL #6   Title Patient will be independent with home program without verbal cuing and be able to self manage  symptoms and exercises by 11/06/2014   Baseline Patient has limited knowledg of appropriate exercises and progression to improve function and self manage symptoms at home   Status On-going               Plan - 10/21/14 1048    Clinical Impression Statement Patient will continue with aquatic therapy and land therapy to maximize LE strengthening and hip mobility bilaterally for improved functional mobility.    Pt will  benefit from skilled therapeutic intervention in order to improve on the following deficits Difficulty walking;Decreased endurance;Pain;Decreased strength;Impaired flexibility   Rehab Potential Good   Clinical Impairments Affecting Rehab Potential (+) motivated, family support   (-) multiple comorbidities: history of CA, colon resection, THR and subsequent fall with femur fracture and THR revision    PT Frequency 3x / week   PT Duration 8 weeks   PT Treatment/Interventions Patient/family education;Therapeutic exercise;Manual techniques;Moist Heat;Cryotherapy;Aquatic Therapy   Consulted and Agree with Plan of Care Patient        Problem List Patient Active Problem List   Diagnosis Date Noted  . Skin lesion of chest wall 07/17/2014  . Breast cancer 03/07/2013  . Personal history of malignant neoplasm of large intestine   . 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 Cancer 12/24/2009  . ANXIETY 06/11/2008  . CONSTIPATION, CHRONIC 06/11/2008  . FIBROMYALGIA 06/11/2008    Conetta,Kristyn, PT, MPT, GCS 10/21/2014, 10:50 AM  [place] [facility] [address] [place], [address] Phone: [phone]   Fax:  [phone]

## 2014-10-22 ENCOUNTER — Encounter: Payer: Medicare Other | Admitting: Physical Therapy

## 2014-10-22 DIAGNOSIS — M810 Age-related osteoporosis without current pathological fracture: Secondary | ICD-10-CM | POA: Diagnosis not present

## 2014-10-23 ENCOUNTER — Ambulatory Visit: Payer: Medicare Other | Admitting: Physical Therapy

## 2014-10-23 DIAGNOSIS — M25659 Stiffness of unspecified hip, not elsewhere classified: Secondary | ICD-10-CM

## 2014-10-23 DIAGNOSIS — M25551 Pain in right hip: Secondary | ICD-10-CM | POA: Diagnosis not present

## 2014-10-23 DIAGNOSIS — R262 Difficulty in walking, not elsewhere classified: Secondary | ICD-10-CM | POA: Diagnosis not present

## 2014-10-23 DIAGNOSIS — M6281 Muscle weakness (generalized): Secondary | ICD-10-CM

## 2014-10-23 NOTE — Therapy (Signed)
[place] [facility] [address], [place], [address] Phone: [phone]   Fax:  [phone]  Physical Therapy Treatment  Patient Details  Name: Terri Perez MRN: [id number] Date of Birth: [birth year] Referring Provider:  Vela Prose, MD  Encounter Date: 10/23/2014      PT End of Session - 10/23/14 1025    Visit Number 27   Number of Visits 28   Date for PT Re-Evaluation 11/06/14   Authorization Type 27   Authorization Time Period 30   PT Start Time 0920   PT Stop Time 1005   PT Time Calculation (min) 45 min   Activity Tolerance Patient tolerated treatment well;No increased pain;Patient limited by fatigue   Behavior During Therapy [facility] for tasks assessed/performed      Past Medical History  Diagnosis Date  . Personal history of fibromyalgia 2002  . Unspecified constipation   . H/O cystitis 2011  . Special screening for malignant neoplasms, colon   . Arm fracture     right forearm, d/t fall  . Personal history of malignant neoplasm of large intestine   . Bowel trouble 2010  . Cancer 12/2009    Left UOQ breast tumor; wide excision,sn bx and whole breast radiation. Chemotherapy done. There was only a 65m area of residual tumor remaining. All sn were negative. This was ER-positive, PR-negative, HER-2 neu not over expressed tumor. The original ultrasound size was 2.6 cm(T2).  . 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 On wide excision 3 mm area of residual tumor. Sentinel node negative. ER-30%, PR-negative, HER-2 not overexpressing.  . Breast cancer 12/2009    left, chemo and radiation    Past Surgical History  Procedure Laterality Date  . Colonoscopy  2010    Dr. WAllen Norris normal  . Portacath placement  2011  . Port-a-cath removal  2013  . Breast surgery Left 2011    wide excision  . Breast biopsy Left 2011  .  Breast cyst aspiration Left 1985  . Skin cancer excision  1980    face  . Tubal ligation  1973  . Abdominal hysterectomy  1974  . Back surgery  1985    bone spurs between 5&6, used bone from left hip  . [facility]  . Femur fracture surgery Right March 2016  . Joint replacement Bilateral Jan 2016 and March 2016    hip    There were no vitals filed for this visit.  Visit Diagnosis:  Muscle weakness  Hip stiffness, unspecified laterality      Subjective Assessment - 10/23/14 1018    Subjective Patient reports "I am tired." Reports she did not sleep well last night.    Patient is accompained by: Family member   Pertinent History Patient reports she fell in the Fall 2015 and then fractured right hip with THR Fall 2015 and while she was recuperating from this she fell and fractured her femur and underwent right hip revision and rod in right femur 04/04/2014. She was seen by home health physical therapy x 8 weeks and is now referred to out patient physical therapy for continued rehabiliation.    Limitations Sitting;Walking;House hold activities;Other (comment)   How long can you sit comfortably? > 1 hour   How long can you stand comfortably? 30 min.    How long can you walk comfortably?  10 min.    Patient Stated Goals She wants to get her "right leg in motion"   Currently in Pain? No/denies                     Adult Aquatic Therapy - 10/23/14 1019    Aquatic Therapy Subjective   Subjective Reports fatigue this morning.  Did not sleep well.    Treatment   Gait Entered/exited pool via ramp with handrail assist with increased time and effort required as well as close sba for safety.  Perrformed 3 laps forward walking with noodle support across pool with close supervision and assist for balance as needed. I lap backward along wall with rail for balance and 1 lap sidestepping also at wall with rail for safety.    Exercises Standing exercises to  include marching, hip abduction, hip extension, hip abduction with knee and hip at 90 degrees, squats x 1 min each.    Specific Exercises Hip/Low Back   Hip/Low Back Seated exercises to include, laq, abd/add, and bicycles x 2 min each.                     PT Education - 10/23/14 1024    Education provided Yes   Education Details safety with on/off bench, progress ion of exercises   Person(s) Educated Patient   Methods Explanation   Comprehension Verbalized understanding;Returned demonstration             PT Long Term Goals - 10/17/14 1050    PT LONG TERM GOAL #1   Title Patient will demonstrate improved function with daily tasks involving right LE as indicated by LEFS score of 30/64 or better by 10/17/2014   Baseline 8/64 (severe self perceived disabiltiy) current 19/64   Status Deferred   PT LONG TERM GOAL #2   Title Patient will improve 10 MW score to 13 seconds or less indicating improved community ambulation and decrease fall risk by 10/17/2014   Baseline 16 seconds   Status Partially Met   PT LONG TERM GOAL #3   Title Patient will be able to ambulate without assistive device for short household ambulation and appropriate assistive device outside the home for more independence in the community by 11/06/2014   Baseline Patient is ambulating within one room a few steps without assistive device   Status On-going   PT LONG TERM GOAL #4   Title Patient will improve LEFS to 40/64 or better by 09/11/2014 demonstrating improved function with daily tasks involving LE   Baseline 8/64   Status On-going   PT LONG TERM GOAL #5   Title Patient will improve 10MW to 10 seconds or less demonstrating improved community ambulation and decreased for falls by 11/06/2014   Baseline 10MW = 16 seconds   Status On-going   PT LONG TERM GOAL #6   Title Patient will be independent with home program without verbal cuing and be able to self manage symptoms and exercises by 11/06/2014    Baseline Patient has limited knowledg of appropriate exercises and progression to improve function and self manage symptoms at home   Status On-going               Plan - 10/23/14 1026    Clinical Impression Statement Patient is making slow steady progress with hip mobility and strength.    Pt will benefit from skilled therapeutic intervention in order to improve on the following deficits Difficulty walking;Decreased endurance;Pain;Decreased strength;Impaired flexibility     Rehab Potential Good   Clinical Impairments Affecting Rehab Potential (+) motivated, family support   (-) multiple comorbidities: history of CA, colon resection, THR and subsequent fall with femur fracture and THR revision    PT Frequency 3x / week   PT Duration 8 weeks   PT Treatment/Interventions Patient/family education;Therapeutic exercise;Manual techniques;Moist Heat;Cryotherapy;Aquatic Therapy   PT Next Visit Plan aquatic therapy and manual therapy to help with spasms, and pain, therapeutic exercise for strength and endurance   PT Home Exercise Plan added hip ER AAROM with instructions to husband   Consulted and Agree with Plan of Care Patient        Problem List Patient Active Problem List   Diagnosis Date Noted  . Skin lesion of chest wall 07/17/2014  . Breast cancer 03/07/2013  . Personal history of malignant neoplasm of large intestine   . 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 Cancer 12/24/2009  . ANXIETY 06/11/2008  . CONSTIPATION, CHRONIC 06/11/2008  . FIBROMYALGIA 06/11/2008    Conetta,Kristyn, PT, MPT, GCS 10/23/2014, 10:27 AM  [facility] [address] [place], [place], [address] Phone: [phone]   Fax:  [phone]      

## 2014-10-28 ENCOUNTER — Ambulatory Visit: Payer: Medicare Other | Attending: Student | Admitting: Physical Therapy

## 2014-10-28 DIAGNOSIS — R262 Difficulty in walking, not elsewhere classified: Secondary | ICD-10-CM

## 2014-10-28 DIAGNOSIS — M25551 Pain in right hip: Secondary | ICD-10-CM | POA: Diagnosis not present

## 2014-10-28 DIAGNOSIS — M6281 Muscle weakness (generalized): Secondary | ICD-10-CM | POA: Diagnosis not present

## 2014-10-28 DIAGNOSIS — R531 Weakness: Secondary | ICD-10-CM | POA: Diagnosis not present

## 2014-10-28 DIAGNOSIS — M25659 Stiffness of unspecified hip, not elsewhere classified: Secondary | ICD-10-CM

## 2014-10-28 NOTE — Therapy (Signed)
[place] [facility] [address], [place], [address] Phone: [phone]   Fax:  [phone]  Physical Therapy Treatment  Patient Details  Name: Terri Perez MRN: [id number] Date of Birth: 10-15-1945 Referring Provider:  Vela Prose, MD  Encounter Date: 10/28/2014      PT End of Session - 10/28/14 1013    Visit Number 28   Number of Visits 28   Date for PT Re-Evaluation 11/06/14   Authorization Type 28   Authorization Time Period 30   PT Start Time 0920   PT Stop Time 1000   PT Time Calculation (min) 40 min   Activity Tolerance Patient tolerated treatment well;Patient limited by fatigue;No increased pain   Behavior During Therapy [facility] for tasks assessed/performed      Past Medical History  Diagnosis Date  . Personal history of fibromyalgia 2002  . Unspecified constipation   . H/O cystitis 2011  . Special screening for malignant neoplasms, colon   . Arm fracture     right forearm, d/t fall  . Personal history of malignant neoplasm of large intestine   . Bowel trouble 2010  . Cancer 12/2009    Left UOQ breast tumor; wide excision,sn bx and whole breast radiation. Chemotherapy done. There was only a 69m area of residual tumor remaining. All sn were negative. This was ER-positive, PR-negative, HER-2 neu not over expressed tumor. The original ultrasound size was 2.6 cm(T2).  . 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 On wide excision 3 mm area of residual tumor. Sentinel node negative. ER-30%, PR-negative, HER-2 not overexpressing.  . Breast cancer 12/2009    left, chemo and radiation    Past Surgical History  Procedure Laterality Date  . Colonoscopy  2010    Dr. WAllen Norris normal  . Portacath placement  2011  . Port-a-cath removal  2013  . Breast surgery Left 2011    wide excision  . Breast biopsy Left 2011  .  Breast cyst aspiration Left 1985  . Skin cancer excision  1980    face  . Tubal ligation  1973  . Abdominal hysterectomy  1974  . Back surgery  1985    bone spurs between 5&6, used bone from left hip  . [facility]  . Femur fracture surgery Right March 2016  . Joint replacement Bilateral Jan 2016 and March 2016    hip    There were no vitals filed for this visit.  Visit Diagnosis:  Muscle weakness  Hip stiffness, unspecified laterality  Difficulty walking      Subjective Assessment - 10/28/14 1010    Subjective Patient reports feeling stiff in her hips.    Patient is accompained by: Family member   Pertinent History Patient reports she fell in the Fall 2015 and then fractured right hip with THR Fall 2015 and while she was recuperating from this she fell and fractured her femur and underwent right hip revision and rod in right femur 04/04/2014. She was seen by home health physical therapy x 8 weeks and is now referred to out patient physical therapy for continued rehabiliation.    Limitations Sitting;Walking;House hold activities;Other (comment)   Patient Stated Goals She wants to get her "right leg in motion"   Currently in Pain? No/denies  Adult Aquatic Therapy - 10/28/14 1011    Aquatic Therapy Subjective   Subjective Patient reports feeling stiff in hips.    Treatment   Gait Entered/exited pool via ramp with handrail assist with increased time and effort required as well as close sba for safety.  Perrformed 3 laps forward walking with noodle support across pool with close supervision and assist for balance as needed. I lap backward along wall with rail for balance and 1 lap sidestepping also at wall with rail for safety.    Exercises Standing exercises to include squats, marching, hip abduction, hip extension, hip abduction with knee and hip at 90 degrees, squats x 1 min each.    Specific Exercises Hip/Low Back    Hip/Low Back Seated exercises to include, laq, abd/add, and bicycles x 2 min each.                     PT Education - 10/28/14 1012    Education provided Yes   Education Details safety, cues for proper form with exercises, expectations for recovery   Person(s) Educated Patient   Methods Explanation   Comprehension Verbalized understanding;Returned demonstration             PT Long Term Goals - 10/17/14 1050    PT LONG TERM GOAL #1   Title Patient will demonstrate improved function with daily tasks involving right LE as indicated by LEFS score of 30/64 or better by 10/17/2014   Baseline 8/64 (severe self perceived disabiltiy) current 19/64   Status Deferred   PT LONG TERM GOAL #2   Title Patient will improve 10 MW score to 13 seconds or less indicating improved community ambulation and decrease fall risk by 10/17/2014   Baseline 16 seconds   Status Partially Met   PT LONG TERM GOAL #3   Title Patient will be able to ambulate without assistive device for short household ambulation and appropriate assistive device outside the home for more independence in the community by 11/06/2014   Baseline Patient is ambulating within one room a few steps without assistive device   Status On-going   PT LONG TERM GOAL #4   Title Patient will improve LEFS to 40/64 or better by 09/11/2014 demonstrating improved function with daily tasks involving LE   Baseline 8/64   Status On-going   PT LONG TERM GOAL #5   Title Patient will improve 10MW to 10 seconds or less demonstrating improved community ambulation and decreased for falls by 11/06/2014   Baseline 10MW = 16 seconds   Status On-going   PT LONG TERM GOAL #6   Title Patient will be independent with home program without verbal cuing and be able to self manage symptoms and exercises by 11/06/2014   Baseline Patient has limited knowledg of appropriate exercises and progression to improve function and self manage symptoms at home    Status On-going               Plan - 10/28/14 1013    Clinical Impression Statement Patient required decreased assistance this session with balance during gait activities. Less time required to perform gait activities.    Pt will benefit from skilled therapeutic intervention in order to improve on the following deficits Difficulty walking;Decreased endurance;Pain;Decreased strength;Impaired flexibility   Rehab Potential Good   Clinical Impairments Affecting Rehab Potential (+) motivated, family support   (-) multiple comorbidities: history of CA, colon resection, THR and subsequent fall with femur fracture and THR revision  PT Frequency 3x / week   PT Duration 8 weeks   PT Treatment/Interventions Patient/family education;Therapeutic exercise;Manual techniques;Moist Heat;Cryotherapy;Aquatic Therapy   PT Next Visit Plan aquatic therapy and manual therapy to help with spasms, and pain, therapeutic exercise for strength and endurance   Consulted and Agree with Plan of Care Patient        Problem List Patient Active Problem List   Diagnosis Date Noted  . Skin lesion of chest wall 07/17/2014  . Breast cancer ([place]) 03/07/2013  . Personal history of malignant neoplasm of large intestine   . 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 Cancer ([place]) 12/24/2009  . ANXIETY 06/11/2008  . CONSTIPATION, CHRONIC 06/11/2008  . FIBROMYALGIA 06/11/2008    Louvina Cleary, PT, MPT, GCS 10/28/2014, 10:15 AM  [facility] [address], [place], [address] Phone: [phone]   Fax:  [phone]

## 2014-10-30 ENCOUNTER — Ambulatory Visit: Payer: Medicare Other | Admitting: Physical Therapy

## 2014-10-30 DIAGNOSIS — M25659 Stiffness of unspecified hip, not elsewhere classified: Secondary | ICD-10-CM | POA: Diagnosis not present

## 2014-10-30 DIAGNOSIS — R262 Difficulty in walking, not elsewhere classified: Secondary | ICD-10-CM | POA: Diagnosis not present

## 2014-10-30 DIAGNOSIS — R531 Weakness: Secondary | ICD-10-CM | POA: Diagnosis not present

## 2014-10-30 DIAGNOSIS — M6281 Muscle weakness (generalized): Secondary | ICD-10-CM

## 2014-10-30 DIAGNOSIS — M25551 Pain in right hip: Secondary | ICD-10-CM | POA: Diagnosis not present

## 2014-10-30 NOTE — Therapy (Signed)
[facility] [place] [facility] [address] [place], [address] Phone: [phone]   Fax:  [phone]  Physical Therapy Treatment  Patient Details  Name: Terri Perez MRN: [id number] Date of Birth: 04-02-1945 Referring Provider:  Vela Prose, MD  Encounter Date: 10/30/2014      PT End of Session - 10/30/14 1020    Visit Number 29   Number of Visits 28   Date for PT Re-Evaluation 11/06/14   Authorization Type 29   Authorization Time Period 30   PT Start Time 0925   PT Stop Time 1010   PT Time Calculation (min) 45 min   Activity Tolerance Patient tolerated treatment well;No increased pain;Patient limited by fatigue   Behavior During Therapy [facility] for tasks assessed/performed      Past Medical History  Diagnosis Date  . Personal history of fibromyalgia 2002  . Unspecified constipation   . H/O cystitis 2011  . Special screening for malignant neoplasms, colon   . Arm fracture     right forearm, d/t fall  . Personal history of malignant neoplasm of large intestine   . Bowel trouble 2010  . Cancer 12/2009    Left UOQ breast tumor; wide excision,sn bx and whole breast radiation. Chemotherapy done. There was only a 54m area of residual tumor remaining. All sn were negative. This was ER-positive, PR-negative, HER-2 neu not over expressed tumor. The original ultrasound size was 2.6 cm(T2).  . 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 On wide excision 3 mm area of residual tumor. Sentinel node negative. ER-30%, PR-negative, HER-2 not overexpressing.  . Breast cancer 12/2009    left, chemo and radiation    Past Surgical History  Procedure Laterality Date  . Colonoscopy  2010    Dr. WAllen Norris normal  . Portacath placement  2011  . Port-a-cath removal  2013  . Breast surgery Left 2011    wide excision  . Breast biopsy Left 2011  .  Breast cyst aspiration Left 1985  . Skin cancer excision  1980    face  . Tubal ligation  1973  . Abdominal hysterectomy  1974  . Back surgery  1985    bone spurs between 5&6, used bone from left hip  . [facility]  . Femur fracture surgery Right March 2016  . Joint replacement Bilateral Jan 2016 and March 2016    hip    There were no vitals filed for this visit.  Visit Diagnosis:  Muscle weakness  Difficulty walking  Hip stiffness, unspecified laterality      Subjective Assessment - 10/30/14 1017    Subjective Continues to report hip stiffness and some fatigue.    Patient is accompained by: Family member   Pertinent History Patient reports she fell in the Fall 2015 and then fractured right hip with THR Fall 2015 and while she was recuperating from this she fell and fractured her femur and underwent right hip revision and rod in right femur 04/04/2014. She was seen by home health physical therapy x 8 weeks and is now referred to out patient physical therapy for continued rehabiliation.    Limitations Sitting;Walking;House hold activities;Other (comment)   Patient Stated Goals She wants to get her "right leg in motion"   Currently in Pain? No/denies  Adult Aquatic Therapy - 10/30/14 1018    Aquatic Therapy Subjective   Subjective Continued stiffness in B hip area.    Treatment   Gait Entered/exited pool via ramp with handrail assist with increased time and effort required as well as close sba for safety.  Perrformed 3 laps forward walking with noodle support across pool with close supervision and assist for balance as needed. I lap backward along wall with rail for balance and 3 laps sidestepping also at wall with rail for safety.    Exercises Standing exercises to include squats, marching, hip abduction, hip extension, hip abduction with knee and hip at 90 degrees, squats x 1 min each.    Specific Exercises Hip/Low Back    Hip/Low Back Seated exercises to include, laq, abd/add, and bicycles x 2 min each.                     PT Education - 10/30/14 1019    Education provided Yes   Education Details safety, cues for exercises   Person(s) Educated Patient   Methods Explanation   Comprehension Verbalized understanding;Returned demonstration             PT Long Term Goals - 10/17/14 1050    PT LONG TERM GOAL #1   Title Patient will demonstrate improved function with daily tasks involving right LE as indicated by LEFS score of 30/64 or better by 10/17/2014   Baseline 8/64 (severe self perceived disabiltiy) current 19/64   Status Deferred   PT LONG TERM GOAL #2   Title Patient will improve 10 MW score to 13 seconds or less indicating improved community ambulation and decrease fall risk by 10/17/2014   Baseline 16 seconds   Status Partially Met   PT LONG TERM GOAL #3   Title Patient will be able to ambulate without assistive device for short household ambulation and appropriate assistive device outside the home for more independence in the community by 11/06/2014   Baseline Patient is ambulating within one room a few steps without assistive device   Status On-going   PT LONG TERM GOAL #4   Title Patient will improve LEFS to 40/64 or better by 09/11/2014 demonstrating improved function with daily tasks involving LE   Baseline 8/64   Status On-going   PT LONG TERM GOAL #5   Title Patient will improve 10MW to 10 seconds or less demonstrating improved community ambulation and decreased for falls by 11/06/2014   Baseline 10MW = 16 seconds   Status On-going   PT LONG TERM GOAL #6   Title Patient will be independent with home program without verbal cuing and be able to self manage symptoms and exercises by 11/06/2014   Baseline Patient has limited knowledg of appropriate exercises and progression to improve function and self manage symptoms at home   Status On-going               Plan  - 10/30/14 1021    Clinical Impression Statement Patient requires lass assistance with water walking, Improved balance and safety as well as increased gait speed observed.     Pt will benefit from skilled therapeutic intervention in order to improve on the following deficits Difficulty walking;Decreased endurance;Pain;Decreased strength;Impaired flexibility   Rehab Potential Good   Clinical Impairments Affecting Rehab Potential (+) motivated, family support   (-) multiple comorbidities: history of CA, colon resection, THR and subsequent fall with femur fracture and THR revision    PT Frequency 3x /  week   PT Duration 8 weeks   PT Treatment/Interventions Patient/family education;Therapeutic exercise;Manual techniques;Moist Heat;Cryotherapy;Aquatic Therapy   PT Next Visit Plan aquatic therapy and manual therapy to help with spasms, and pain, therapeutic exercise for strength and endurance   Consulted and Agree with Plan of Care Patient        Problem List Patient Active Problem List   Diagnosis Date Noted  . Skin lesion of chest wall 07/17/2014  . Breast cancer ([place]) 03/07/2013  . Personal history of malignant neoplasm of large intestine   . Malignant neoplasm of upper-outer quadrant of female breast ([place])   . Cancer ([place]) 12/24/2009  . ANXIETY 06/11/2008  . CONSTIPATION, CHRONIC 06/11/2008  . FIBROMYALGIA 06/11/2008    Juell Radney, PT, MPT, GCS 10/30/2014, 10:22 AM  [place] [facility] [address], [place], [address] Phone: [phone]   Fax:  [phone]

## 2014-10-31 ENCOUNTER — Encounter: Payer: Self-pay | Admitting: Physical Therapy

## 2014-10-31 ENCOUNTER — Ambulatory Visit: Payer: Medicare Other | Admitting: Physical Therapy

## 2014-10-31 DIAGNOSIS — M25659 Stiffness of unspecified hip, not elsewhere classified: Secondary | ICD-10-CM | POA: Diagnosis not present

## 2014-10-31 DIAGNOSIS — M6281 Muscle weakness (generalized): Secondary | ICD-10-CM | POA: Diagnosis not present

## 2014-10-31 DIAGNOSIS — M25551 Pain in right hip: Secondary | ICD-10-CM | POA: Diagnosis not present

## 2014-10-31 DIAGNOSIS — R531 Weakness: Secondary | ICD-10-CM

## 2014-10-31 DIAGNOSIS — R262 Difficulty in walking, not elsewhere classified: Secondary | ICD-10-CM | POA: Diagnosis not present

## 2014-10-31 NOTE — Therapy (Signed)
[place] [facility] [address], [place], [address] Phone: [phone]   Fax:  [phone]  Physical Therapy Treatment  Patient Details  Name: Terri Perez MRN: [id number] Date of Birth: Jan 12, 1946 Referring Provider:  Vela Prose, MD  Encounter Date: 10/31/2014      PT End of Session - 10/31/14 1308    Visit Number 30   Number of Visits 43   Date for PT Re-Evaluation 12/05/14   Authorization Type 30   Authorization Time Period 30   PT Start Time 1128   PT Stop Time 1210   PT Time Calculation (min) 42 min   Activity Tolerance Patient tolerated treatment well;No increased pain;Patient limited by fatigue   Behavior During Therapy [facility] for tasks assessed/performed      Past Medical History  Diagnosis Date  . Personal history of fibromyalgia 2002  . Unspecified constipation   . H/O cystitis 2011  . Special screening for malignant neoplasms, colon   . Arm fracture     right forearm, d/t fall  . Personal history of malignant neoplasm of large intestine   . Bowel trouble 2010  . Cancer [facility]) 12/2009    Left UOQ breast tumor; wide excision,sn bx and whole breast radiation. Chemotherapy done. There was only a 25m area of residual tumor remaining. All sn were negative. This was ER-positive, PR-negative, HER-2 neu not over expressed tumor. The original ultrasound size was 2.6 cm(T2).  . Malignant neoplasm of upper-outer quadrant of female breast ([facility] January 13, 2010    2+ centimeter tumor, neoadjuvant chemotherapy. On wide excision 3 mm area of residual tumor. Sentinel node negative. ER-30%, PR-negative, HER-2 not overexpressing.  [place]    [place], chemo and radiation    Past Surgical History  Procedure Laterality Date  . Colonoscopy  2010    Dr. WAllen Norris normal  . Portacath placement  2011  . Port-a-cath removal  2013  . Breast surgery Left 2011    wide excision  . Breast  biopsy Left 2011  . Breast cyst aspiration Left 1985  . Skin cancer excision  1980    face  . Tubal ligation  1973  . Abdominal hysterectomy  1974  . Back surgery  1985    bone spurs between 5&6, used bone from left hip  . [facility]  . Femur fracture surgery Right March 2016  . Joint replacement Bilateral Jan 2016 and March 2016    hip    There were no vitals filed for this visit.  Visit Diagnosis:  Difficulty walking  Muscle weakness (generalized)  Weakness generalized      Subjective Assessment - 10/31/14 1130    Subjective Patient reports she is doing well in pool because she can move with less difficulty and her right leg feels heavy: intermittently.  She feels she continues to need massage to her right leg and she is moving her leg a lot with exericses at home.    Patient Stated Goals Patient would like to be able to move more easily, dress herself and walk with less difficulty.  Pain reported as soreness in right thigh and up close to outside of hip today      Objective: LEFS: 28/64  (was 19/64) Outcome measures: 10/03/2014: TUG 35.5 seconds, 10 MW: 19 seconds, 5x sit to stand 36 seconds; 10/17/2014: 10 MW 14.9 seconds, TUG 31 seconds, 5x sit to  stand (off 21" table) 20 seconds: 10/31/2014: TUG 26.5 seconds,  10MW 15 seconds (using rolling walker), 5x sit to stand 15.5 seconds  Palpation; decreased sof ttissue elasticity in right quadriceps proximal region and just above patella and along vastus lateralis and proximal attachment of quadriceps and tensor fascia lata Gait: ambulating with rollator with more erect posture and improved cadence from previous assessment, with increased speed of walking note right quadriceps goes into spasms as patient's right LE begins to "jerk" as she is hindered from advancing right LE from push off position Strength: continues with decreased right LE strength hip extension, knee flexion, extension and hip abduction  and ER ~4-/5 as compared to left LE Flexibility: decreased right hip ER by 30%, decreased hip extension 25% as compared to left LE            [facility] Adult PT Treatment/Exercise - 10/31/14 1320    Exercises   Exercises Other Exercises   Other Exercises  Instructed in exercises to perform throughout the day to keep flexible in trunk rotation and shoulder flexion and LE knee flexion and extension: patient performed with guidance and instruction of therapist 10 reps each forward elevation of UE's hands to forehead, trunk rotation reach to outside of opposite knee x 10, tap heels to floor with knee extension x 10 reps (to be done during a commercial if watching TV: one exercise per commercial and alternate the exercises)   Manual Therapy   Manual Therapy Soft tissue mobilization   Soft tissue mobilization with patient seated on treatemnt table: STM performed superficial and deep techniques to lateral aspect of thigh and along quadriceps attachment to attachment, concentrated on proximal muscles today      Patient response to treatment: TUG, 10MW and sit to stand in need of interventions to improve outcome measures to WFL's and to reduce fall risk and improve household and community ambulation, patient verbalized understanding of exercises to be done at home to improve flexibility and endurance for household tasks such as reaching into cabinets and washing hair. Improved soft tissue elasticity and decreased soreness noted in right LE following STM with improved hip extension noted with gait pattern/push off phase on right          PT Education - 10/31/14 1327    Education provided Yes   Education Details instructed in home program to add endurance and flexiblity exercises to be done throughout the day   Person(s) Educated Patient   Methods Explanation;Demonstration;Verbal cues   Comprehension Verbalized understanding;Returned demonstration;Verbal cues required             PT Long  Term Goals - 10/31/14 1234    PT LONG TERM GOAL #1   Title patient will demonstrate decreased fall risk by TUG of 20 seconds or better by 12/05/2014   Baseline Curent TUG 26.5 seconds  (was 30 seconds 10/17/14)   Status New   PT LONG TERM GOAL #2   Title Patient will improve 10 MW score to 13 seconds or less indicating improved community ambulation and decrease fall risk by 12/05/2014   Baseline 16 seconds 10/31/14 = 15 seconds   Status Revised   PT LONG TERM GOAL #3   Title Patient will be able to ambulate without assistive device for short household ambulation and appropriate assistive device outside the home for more independence in the community by 12/05/2014   Baseline Patient is ambulating within one room a few steps without assistive device   Status On-going   PT  LONG TERM GOAL #4   Title Patient will improve LEFS to 40/64 or better by 12/05/2014 demonstrating improved function with daily tasks involving LE   Baseline 28/64   Status Revised   PT LONG TERM GOAL #5   Title Patient will be independent with home program without verbal cuing and be able to self manage symptoms and exercises by 12/05/2014   Baseline Patient has limited knowledg of appropriate exercises and progression to improve function and self manage symptoms at home   Status Revised               Plan - 11-30-14 1304    Clinical Impression Statement Patient is progressing with current treatment with progression towards all goals and significant changes noted with TUG and 5x sit to stand scores. She continues with limitations of muscle spasms in right thigh/LE musculature which limits her ability to walk in community or house without difficulty. She has weakness in right LE and general decreased endurance for daily activties. She is responding favorably to exercises with aquatic therapy and it is recommended that she continue with this 2x/week and re assess every 2 weeks for continued progress.    Pt will benefit  from skilled therapeutic intervention in order to improve on the following deficits Difficulty walking;Decreased endurance;Pain;Decreased strength;Impaired flexibility   Rehab Potential Good   PT Frequency 3x / week   PT Duration 6 weeks   PT Treatment/Interventions Patient/family education;Therapeutic exercise;Manual techniques;Moist Heat;Cryotherapy;Aquatic Therapy   PT Next Visit Plan aquatic therapy and manual therapy to help with spasms, and pain, therapeutic exercise for strength and endurance          G-Codes - November 30, 2014 1241    Functional Assessment Tool Used LEFS, 10MW, clinical judgment, pain scale, strength deficits, ROM deficits, TUG   Functional Limitation Mobility: Walking and moving around   Mobility: Walking and Moving Around Current Status [phone]) At least 40 percent but less than 60 percent impaired, limited or restricted   Mobility: Walking and Moving Around Goal Status [phone]) At least 20 percent but less than 40 percent impaired, limited or restricted      Problem List Patient Active Problem List   Diagnosis Date Noted  . Skin lesion of chest wall 07/17/2014  . Breast cancer ([place]) 03/07/2013  . Personal history of malignant neoplasm of large intestine   . Malignant neoplasm of upper-outer quadrant of female breast ([place])   . Cancer ([place]) 12/24/2009  . ANXIETY 06/11/2008  . CONSTIPATION, CHRONIC 06/11/2008  . FIBROMYALGIA 06/11/2008    Jomarie Longs PT 11/30/2014, 1:28 PM  [place] [facility] [address] [place], [address] Phone: [phone]   Fax:  [phone]

## 2014-11-04 ENCOUNTER — Ambulatory Visit: Payer: Medicare Other | Admitting: Physical Therapy

## 2014-11-04 DIAGNOSIS — R262 Difficulty in walking, not elsewhere classified: Secondary | ICD-10-CM | POA: Diagnosis not present

## 2014-11-04 DIAGNOSIS — M6281 Muscle weakness (generalized): Secondary | ICD-10-CM | POA: Diagnosis not present

## 2014-11-04 DIAGNOSIS — R531 Weakness: Secondary | ICD-10-CM | POA: Diagnosis not present

## 2014-11-04 DIAGNOSIS — M25551 Pain in right hip: Secondary | ICD-10-CM | POA: Diagnosis not present

## 2014-11-04 DIAGNOSIS — M25659 Stiffness of unspecified hip, not elsewhere classified: Secondary | ICD-10-CM

## 2014-11-04 NOTE — Therapy (Signed)
[place] [facility] [address] [place], [address] Phone: [phone]   Fax:  [phone]  Physical Therapy Treatment  Patient Details  Name: Terri Perez MRN: [id number] Date of Birth: 1945-03-13 Referring Provider:  Vela Prose, MD  Encounter Date: 11/04/2014      PT End of Session - 11/04/14 1016    Visit Number 31   Number of Visits 43   Date for PT Re-Evaluation 12/05/14   Authorization Type 31   Authorization Time Period 30   PT Start Time 0920   PT Stop Time 1007   PT Time Calculation (min) 47 min   Activity Tolerance Patient tolerated treatment well;No increased pain   Behavior During Therapy [facility] for tasks assessed/performed      Past Medical History  Diagnosis Date  . Personal history of fibromyalgia 2002  . Unspecified constipation   . H/O cystitis 2011  . Special screening for malignant neoplasms, colon   . Arm fracture     right forearm, d/t fall  . Personal history of malignant neoplasm of large intestine   . Bowel trouble 2010  . Cancer [facility]) 12/2009    Left UOQ breast tumor; wide excision,sn bx and whole breast radiation. Chemotherapy done. There was only a 40m area of residual tumor remaining. All sn were negative. This was ER-positive, PR-negative, HER-2 neu not over expressed tumor. The original ultrasound size was 2.6 cm(T2).  . 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 On wide excision 3 mm area of residual tumor. Sentinel node negative. ER-30%, PR-negative, HER-2 not overexpressing.  . [place]    left, chemo and radiation    Past Surgical History  Procedure Laterality Date  . Colonoscopy  2010    Dr. WAllen Norris normal  . Portacath placement  2011  . Port-a-cath removal  2013  . Breast surgery Left 2011    wide excision  . Breast biopsy Left 2011  . Breast  cyst aspiration Left 1985  . Skin cancer excision  1980    face  . Tubal ligation  1973  . Abdominal hysterectomy  1974  . Back surgery  1985    bone spurs between 5&6, used bone from left hip  . [facility]  . Femur fracture surgery Right March 2016  . Joint replacement Bilateral Jan 2016 and March 2016    hip    There were no vitals filed for this visit.  Visit Diagnosis:  Difficulty walking  Muscle weakness (generalized)  Hip stiffness, unspecified laterality      Subjective Assessment - 11/04/14 1013    Subjective Patient reports she is feeling very stiff, sore this morning in right LE.    Patient is accompained by: Family member   Pertinent History Patient reports she fell in the Fall 2015 and then fractured right hip with THR Fall 2015 and while she was recuperating from this she fell and fractured her femur and underwent right hip revision and rod in right femur 04/04/2014. She was seen by home health physical therapy x 8 weeks and is now referred to out patient physical therapy for continued rehabiliation.    Limitations Sitting;Walking;House hold activities;Other (comment)   Patient Stated Goals Patient would like to be able to move more easily, dress herself and walk with less difficulty.    Currently  in Pain? No/denies                     Adult Aquatic Therapy - 11/04/14 1014    Aquatic Therapy Subjective   Subjective Continued stiffness in B hip area.    Treatment   Gait Entered/exited pool via ramp with handrail assist with increased time and effort required as well as close sba for safety.  Perrformed 3 laps forward walking with noodle support across pool with close supervision and assist for balance as needed. I lap backward along wall with rail for balance and 3 laps sidestepping also at wall with rail for safety.    Exercises Standing exercises to include marching, hip abduction, hip extension, hip abduction with knee and  hip at 90 degrees, hip circles x 1 min each.    Specific Exercises Hip/Low Back   Hip/Low Back Seated exercises to include, laq, abd/add, and bicycles x 2 min each.      Patient reported increased stiffness with bicycles in seated position and she did not want to walk backward today due to  Hip stiffness and soreness.                PT Education - 11/04/14 1016    Education provided Yes   Education Details what muscles are being exercised when    Person(s) Educated Patient   Methods Explanation   Comprehension Verbalized understanding             PT Long Term Goals - 10/31/14 1234    PT LONG TERM GOAL #1   Title patient will demonstrate decreased fall risk by TUG of 20 seconds or better by 12/05/2014   Baseline Curent TUG 26.5 seconds  (was 30 seconds 10/17/14)   Status New   PT LONG TERM GOAL #2   Title Patient will improve 10 MW score to 13 seconds or less indicating improved community ambulation and decrease fall risk by 12/05/2014   Baseline 16 seconds 10/31/14 = 15 seconds   Status Revised   PT LONG TERM GOAL #3   Title Patient will be able to ambulate without assistive device for short household ambulation and appropriate assistive device outside the home for more independence in the community by 12/05/2014   Baseline Patient is ambulating within one room a few steps without assistive device   Status On-going   PT LONG TERM GOAL #4   Title Patient will improve LEFS to 40/64 or better by 12/05/2014 demonstrating improved function with daily tasks involving LE   Baseline 28/64   Status Revised   PT LONG TERM GOAL #5   Title Patient will be independent with home program without verbal cuing and be able to self manage symptoms and exercises by 12/05/2014   Baseline Patient has limited knowledg of appropriate exercises and progression to improve function and self manage symptoms at home   Status Revised               Plan - 11/04/14 1017    Clinical  Impression Statement Patient is limited by hip tightness, soreness, fatigue. Pt is making slow progress with functional mobility and strength.    Pt will benefit from skilled therapeutic intervention in order to improve on the following deficits Difficulty walking;Decreased endurance;Pain;Decreased strength;Impaired flexibility   Rehab Potential Good   Clinical Impairments Affecting Rehab Potential (+) motivated, family support   (-) multiple comorbidities: history of CA, colon resection, THR and subsequent fall with femur fracture and THR revision  PT Frequency 3x / week   PT Duration 6 weeks   PT Treatment/Interventions Patient/family education;Therapeutic exercise;Manual techniques;Moist Heat;Cryotherapy;Aquatic Therapy   PT Next Visit Plan aquatic therapy and manual therapy to help with spasms, and pain, therapeutic exercise for strength and endurance   Consulted and Agree with Plan of Care Patient        Problem List Patient Active Problem List   Diagnosis Date Noted  . Skin lesion of chest wall 07/17/2014  . Breast cancer ([place]) 03/07/2013  . Personal history of malignant neoplasm of large intestine   . Malignant neoplasm of upper-outer quadrant of female breast ([place])   . Cancer ([place]) 12/24/2009  . ANXIETY 06/11/2008  . CONSTIPATION, CHRONIC 06/11/2008  . FIBROMYALGIA 06/11/2008    Trennon Torbeck, PT, MPT, GCS 11/04/2014, 10:19 AM  [place] [facility] [address] [place], [address] Phone: [phone]   Fax:  [phone]

## 2014-11-06 ENCOUNTER — Ambulatory Visit: Payer: Medicare Other | Admitting: Physical Therapy

## 2014-11-07 ENCOUNTER — Ambulatory Visit: Payer: Medicare Other | Admitting: Physical Therapy

## 2014-11-11 ENCOUNTER — Ambulatory Visit: Payer: Medicare Other | Admitting: Physical Therapy

## 2014-11-13 ENCOUNTER — Ambulatory Visit: Payer: Medicare Other | Admitting: Physical Therapy

## 2014-11-13 DIAGNOSIS — M25659 Stiffness of unspecified hip, not elsewhere classified: Secondary | ICD-10-CM | POA: Diagnosis not present

## 2014-11-13 DIAGNOSIS — M25551 Pain in right hip: Secondary | ICD-10-CM | POA: Diagnosis not present

## 2014-11-13 DIAGNOSIS — R531 Weakness: Secondary | ICD-10-CM

## 2014-11-13 DIAGNOSIS — R262 Difficulty in walking, not elsewhere classified: Secondary | ICD-10-CM | POA: Diagnosis not present

## 2014-11-13 DIAGNOSIS — M6281 Muscle weakness (generalized): Secondary | ICD-10-CM | POA: Diagnosis not present

## 2014-11-13 NOTE — Therapy (Signed)
[place] [facility] [address] [place], [address] Phone: [phone]   Fax:  [phone]  Physical Therapy Treatment  Patient Details  Name: Terri Perez MRN: [id number] Date of Birth: 03/25/1945 No Data Recorded  Encounter Date: 11/13/2014      PT End of Session - 11/13/14 1036    Visit Number 32   Number of Visits 43   Date for PT Re-Evaluation 12/05/14   Authorization Type 32   Authorization Time Period 30   PT Start Time 0923   PT Stop Time 1002   PT Time Calculation (min) 39 min   Equipment Utilized During Treatment Back brace   Activity Tolerance Patient tolerated treatment well;No increased pain;Patient limited by fatigue   Behavior During Therapy [facility] for tasks assessed/performed      Past Medical History  Diagnosis Date  . Personal history of fibromyalgia 2002  . Unspecified constipation   . H/O cystitis 2011  . Special screening for malignant neoplasms, colon   . Arm fracture     right forearm, d/t fall  . Personal history of malignant neoplasm of large intestine   . Bowel trouble 2010  . Cancer [facility]) 12/2009    Left UOQ breast tumor; wide excision,sn bx and whole breast radiation. Chemotherapy done. There was only a 71m area of residual tumor remaining. All sn were negative. This was ER-positive, PR-negative, HER-2 neu not over expressed tumor. The original ultrasound size was 2.6 cm(T2).  . Malignant neoplasm of upper-outer quadrant of female breast ([facility] January 13, 2010    2+ centimeter tumor, neoadjuvant chemotherapy. On wide excision 3 mm area of residual tumor. Sentinel node negative. ER-30%, PR-negative, HER-2 not overexpressing.  . [place]    left, chemo and radiation    Past Surgical History  Procedure Laterality Date  . Colonoscopy  2010    Dr. WAllen Norris normal  . Portacath placement  2011  . Port-a-cath removal  2013  . Breast surgery Left 2011    wide  excision  . Breast biopsy Left 2011  . Breast cyst aspiration Left 1985  . Skin cancer excision  1980    face  . Tubal ligation  1973  . Abdominal hysterectomy  1974  . Back surgery  1985    bone spurs between 5&6, used bone from left hip  . [facility]  . Femur fracture surgery Right March 2016  . Joint replacement Bilateral Jan 2016 and March 2016    hip    There were no vitals filed for this visit.  Visit Diagnosis:  Difficulty walking  Right hip pain  Weakness generalized  Hip stiffness, unspecified laterality      Subjective Assessment - 11/13/14 1033    Subjective Patient reports that she has not been feeling well and believes that it is the osteoporosis medicine that is making her feel bad. Pt reports hip soreness with ball squeezes.    Patient is accompained by: Family member   Pertinent History Patient reports she fell in the Fall 2015 and then fractured right hip with THR Fall 2015 and while she was recuperating from this she fell and fractured her femur and underwent right hip revision and rod in right femur 04/04/2014. She was seen by home health physical therapy x 8 weeks and is now referred to out patient physical therapy for continued rehabiliation.    Patient Stated Goals  Patient would like to be able to move more easily, dress herself and walk with less difficulty.    Currently in Pain? Yes   Pain Score 2    Pain Location Hip   Pain Orientation Right   Pain Descriptors / Indicators Tightness;Sore   Pain Type Chronic pain   Pain Onset More than a month ago   Pain Frequency Intermittent   Multiple Pain Sites No                     Adult Aquatic Therapy - 11/13/14 1035    Aquatic Therapy Subjective   Subjective Continued stiffness in B hip area.    Treatment   Gait Entered/exited pool via ramp with handrail assist with increased time and effort required as well as close sba for safety.  Perrformed 3 laps forward  walking with noodle support across pool with close supervision and assist for balance as needed. I lap backward along wall with rail for balance and 3 laps sidestepping also at wall with rail for safety.    Exercises Standing exercises to include marching, hip abduction, hip extension, hip abduction with knee and hip at 90 degrees, hip circles x 1 min each.    Specific Exercises Hip/Low Back   Hip/Low Back Seated exercises to include, laq, abd/add, and bicycles x 2 min each.                     PT Education - 11/13/14 1036    Education provided Yes   Education Details reviewed what she is doing at home.    Person(s) Educated Patient   Methods Explanation   Comprehension Verbalized understanding             PT Long Term Goals - 10/31/14 1234    PT LONG TERM GOAL #1   Title patient will demonstrate decreased fall risk by TUG of 20 seconds or better by 12/05/2014   Baseline Curent TUG 26.5 seconds  (was 30 seconds 10/17/14)   Status New   PT LONG TERM GOAL #2   Title Patient will improve 10 MW score to 13 seconds or less indicating improved community ambulation and decrease fall risk by 12/05/2014   Baseline 16 seconds 10/31/14 = 15 seconds   Status Revised   PT LONG TERM GOAL #3   Title Patient will be able to ambulate without assistive device for short household ambulation and appropriate assistive device outside the home for more independence in the community by 12/05/2014   Baseline Patient is ambulating within one room a few steps without assistive device   Status On-going   PT LONG TERM GOAL #4   Title Patient will improve LEFS to 40/64 or better by 12/05/2014 demonstrating improved function with daily tasks involving LE   Baseline 28/64   Status Revised   PT LONG TERM GOAL #5   Title Patient will be independent with home program without verbal cuing and be able to self manage symptoms and exercises by 12/05/2014   Baseline Patient has limited knowledg of  appropriate exercises and progression to improve function and self manage symptoms at home   Status Revised               Plan - 11/13/14 1037    Clinical Impression Statement Patient demonstrate improved gait speed and required decreased assistance for balance this session.    Pt will benefit from skilled therapeutic intervention in order to improve on the following deficits  Difficulty walking;Decreased endurance;Pain;Decreased strength;Impaired flexibility   Rehab Potential Good   Clinical Impairments Affecting Rehab Potential (+) motivated, family support   (-) multiple comorbidities: history of CA, colon resection, THR and subsequent fall with femur fracture and THR revision    PT Frequency 3x / week   PT Duration 6 weeks   PT Treatment/Interventions Patient/family education;Therapeutic exercise;Manual techniques;Moist Heat;Cryotherapy;Aquatic Therapy   PT Next Visit Plan aquatic therapy and manual therapy to help with spasms, and pain, therapeutic exercise for strength and endurance   Consulted and Agree with Plan of Care Patient        Problem List Patient Active Problem List   Diagnosis Date Noted  . Skin lesion of chest wall 07/17/2014  . Breast cancer ([place]) 03/07/2013  . Personal history of malignant neoplasm of large intestine   . 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 Cancer ([place]) 12/24/2009  . ANXIETY 06/11/2008  . CONSTIPATION, CHRONIC 06/11/2008  . FIBROMYALGIA 06/11/2008    Cleaster Shiffer, PT, MPT, GCS 11/13/2014, 10:39 AM  [place] [facility] [address], [place], [address] Phone: [phone]   Fax:  [phone]  Name: KAMALI SAKATA MRN: [id number] Date of Birth: Mar 15, 1945

## 2014-11-14 ENCOUNTER — Ambulatory Visit: Payer: Medicare Other | Admitting: Physical Therapy

## 2014-11-14 ENCOUNTER — Encounter: Payer: Self-pay | Admitting: Physical Therapy

## 2014-11-14 DIAGNOSIS — R531 Weakness: Secondary | ICD-10-CM | POA: Diagnosis not present

## 2014-11-14 DIAGNOSIS — R262 Difficulty in walking, not elsewhere classified: Secondary | ICD-10-CM | POA: Diagnosis not present

## 2014-11-14 DIAGNOSIS — M6281 Muscle weakness (generalized): Secondary | ICD-10-CM | POA: Diagnosis not present

## 2014-11-14 DIAGNOSIS — M25551 Pain in right hip: Secondary | ICD-10-CM | POA: Diagnosis not present

## 2014-11-14 DIAGNOSIS — M25659 Stiffness of unspecified hip, not elsewhere classified: Secondary | ICD-10-CM | POA: Diagnosis not present

## 2014-11-14 NOTE — Therapy (Signed)
[place] [facility] [address] [place], [address] Phone: [phone]   Fax:  [phone]  Physical Therapy Treatment  Patient Details  Name: Terri Perez MRN: [id number] Date of Birth: [birth year] No Data Recorded  Encounter Date: 11/14/2014      PT End of Session - 11/14/14 1004    Visit Number 33   Number of Visits 43   Date for PT Re-Evaluation 12/05/14   Authorization Type 33   Authorization Time Period 40   PT Start Time 0957   PT Stop Time 1040   PT Time Calculation (min) 43 min   Activity Tolerance Patient tolerated treatment well   Behavior During Therapy [facility] for tasks assessed/performed      Past Medical History  Diagnosis Date  . Personal history of fibromyalgia 2002  . Unspecified constipation   . H/O cystitis 2011  . Special screening for malignant neoplasms, colon   . Arm fracture     right forearm, d/t fall  . Personal history of malignant neoplasm of large intestine   . Bowel trouble 2010  . Cancer [facility]) 12/2009    Left UOQ breast tumor; wide excision,sn bx and whole breast radiation. Chemotherapy done. There was only a 72m area of residual tumor remaining. All sn were negative. This was ER-positive, PR-negative, HER-2 neu not over expressed tumor. The original ultrasound size was 2.6 cm(T2).  . Malignant neoplasm of upper-outer quadrant of female breast ([facility] January 13, 2010    2+ centimeter tumor, neoadjuvant chemotherapy. On wide excision 3 mm area of residual tumor. Sentinel node negative. ER-30%, PR-negative, HER-2 not overexpressing.  . [place]    left, chemo and radiation    Past Surgical History  Procedure Laterality Date  . Colonoscopy  2010    Dr. WAllen Norris normal  . Portacath placement  2011  . Port-a-cath removal  2013  . Breast surgery Left 2011    wide excision  . Breast biopsy Left 2011  . Breast cyst aspiration Left 1985  . Skin cancer  excision  1980    face  . Tubal ligation  1973  . Abdominal hysterectomy  1974  . Back surgery  1985    bone spurs between 5&6, used bone from left hip  . [facility]  . Femur fracture surgery Right March 2016  . Joint replacement Bilateral Jan 2016 and March 2016    hip    There were no vitals filed for this visit.  Visit Diagnosis:  Difficulty walking  Muscle weakness (generalized)  Right hip pain      Subjective Assessment - 11/14/14 0959    Subjective Patient reports she is doing better with improving flexibility and strength. She has modified how she takes medication so she feels better. She feels muscle tightness today in right hip/groin region and she has not been able to massage it out in the past week or so.    Limitations Sitting;Walking;House hold activities   Patient Stated Goals Patient would like to be able to move more easily, dress herself and walk with less difficulty.    Currently in Pain? Yes   Pain Score 2    Pain Location Hip   Pain Orientation Right   Pain Descriptors / Indicators Tightness;Sore   Pain Type Chronic pain   Pain Onset More than a month ago   Pain Frequency Intermittent   Multiple Pain  Sites No        Palpation; decreased sof ttissue elasticity in right quadriceps proximal region and just above patella and along vastus lateralis and proximal attachment of quadriceps and tensor fascia lata Gait: ambulating with rollator with more erect posture and improved cadence from previous assessment, with increased speed of walking note right quadriceps goes into spasms as patient's right LE begins to "jerk" as she is hindered from advancing right LE from push off position Strength: continues with decreased right LE strength hip extension, knee flexion, extension and hip abduction and ER ~4-/5 as compared to left LE        [facility] Adult PT Treatment/Exercise - 11/14/14 1003    Exercises   Exercises Other Exercises    Other Exercises  sitting on treatment table: AAROM right knee flexion/extension isometric knee flexion with assistance multiple angles x 3 sets with 5 second holds, sit to stand  from high table (~22") 3 x 5 reps with miniimal assistance of therapist for proper weight shift, seated weight shift right/left with reaching for cone x 10 reps,  isometric hip abduction with ER in sitting x 10 reps with graded manual resistance (moderate),  Re assessed 10 MW = 16 seconds and 5x sit to stand 18.8, 19.9 seconds   Manual Therapy   Manual Therapy Soft tissue mobilization   Soft tissue mobilization with patient seated on treatemnt table: STM performed superficial and deep techniques to lateral aspect of thigh and along quadriceps attachment to attachment, patella mobilization performed with patella mobilizer x 2 sets      Patient response to treatment: improved soft tissue elasticity, decreased use of UE's for sit to stand, improved weight shifting with repetition of exercise, requires assistance to perform exercises through full ROM and with good technique.           PT Education - 11/14/14 1004    Education provided Yes   Education Details re inforced exercisesa dn proper sit to sit to stand posture   Person(s) Educated Patient   Methods Explanation   Comprehension Verbalized understanding             PT Long Term Goals - 10/31/14 1234    PT LONG TERM GOAL #1   Title patient will demonstrate decreased fall risk by TUG of 20 seconds or better by 12/05/2014   Baseline Curent TUG 26.5 seconds  (was 30 seconds 10/17/14)   Status New   PT LONG TERM GOAL #2   Title Patient will improve 10 MW score to 13 seconds or less indicating improved community ambulation and decrease fall risk by 12/05/2014   Baseline 16 seconds 10/31/14 = 15 seconds   Status Revised   PT LONG TERM GOAL #3   Title Patient will be able to ambulate without assistive device for short household ambulation and appropriate  assistive device outside the home for more independence in the community by 12/05/2014   Baseline Patient is ambulating within one room a few steps without assistive device   Status On-going   PT LONG TERM GOAL #4   Title Patient will improve LEFS to 40/64 or better by 12/05/2014 demonstrating improved function with daily tasks involving LE   Baseline 28/64   Status Revised   PT LONG TERM GOAL #5   Title Patient will be independent with home program without verbal cuing and be able to self manage symptoms and exercises by 12/05/2014   Baseline Patient has limited knowledg of appropriate exercises and progression to improve  function and self manage symptoms at home   Status Revised               Plan - 11/14/14 1455    Clinical Impression Statement Improved gait pattern, less stiffness, improved soft tissue elasticity with STM. Patient has missed a few sessions in the past 2 weeks of aquatic therapy with no significant changes noted with 10 MW and sit to stand and will benefit from additional sessions to continue to improve strength/balance and ability to walk with less difficulty.    Pt will benefit from skilled therapeutic intervention in order to improve on the following deficits Difficulty walking;Decreased endurance;Pain;Decreased strength;Impaired flexibility   Rehab Potential Good   PT Frequency 3x / week   PT Duration 6 weeks   PT Treatment/Interventions Patient/family education;Therapeutic exercise;Manual techniques;Moist Heat;Cryotherapy;Aquatic Therapy   PT Next Visit Plan aquatic therapy and manual therapy to help with spasms, and pain, therapeutic exercise for strength and endurance        Problem List Patient Active Problem List   Diagnosis Date Noted  . Skin lesion of chest wall 07/17/2014  . Breast cancer ([place]) 03/07/2013  . Personal history of malignant neoplasm of large intestine   . Malignant neoplasm of upper-outer quadrant of female breast ([place])   . Cancer  ([place]) 12/24/2009  . ANXIETY 06/11/2008  . CONSTIPATION, CHRONIC 06/11/2008  . FIBROMYALGIA 06/11/2008    Aldona Lento 11/15/2014, 1:49 PM  [place] [facility] [address] [place], [address] Phone: [phone]   Fax:  [phone]  Name: Terri Perez MRN: [id number] Date of Birth: 1945/10/18

## 2014-11-18 ENCOUNTER — Ambulatory Visit: Payer: Medicare Other | Admitting: Physical Therapy

## 2014-11-18 DIAGNOSIS — R262 Difficulty in walking, not elsewhere classified: Secondary | ICD-10-CM

## 2014-11-18 DIAGNOSIS — M6281 Muscle weakness (generalized): Secondary | ICD-10-CM | POA: Diagnosis not present

## 2014-11-18 DIAGNOSIS — M25551 Pain in right hip: Secondary | ICD-10-CM

## 2014-11-18 DIAGNOSIS — R531 Weakness: Secondary | ICD-10-CM | POA: Diagnosis not present

## 2014-11-18 DIAGNOSIS — M25659 Stiffness of unspecified hip, not elsewhere classified: Secondary | ICD-10-CM | POA: Diagnosis not present

## 2014-11-18 NOTE — Therapy (Signed)
[facility] [address] [place], [address] Phone: [phone]   Fax:  [phone]  Physical Therapy Treatment  Patient Details  Name: Terri Perez MRN: [id number] Date of Birth: 12-19-1945 No Data Recorded  Encounter Date: 11/18/2014      PT End of Session - 11/18/14 1011    Visit Number 34   Number of Visits 43   Date for PT Re-Evaluation 12/05/14   Authorization Type 34   Authorization Time Period 40   PT Start Time 0925   PT Stop Time 1005   PT Time Calculation (min) 40 min   Activity Tolerance Patient tolerated treatment well;Patient limited by fatigue   Behavior During Therapy [employer] for tasks assessed/performed      Past Medical History  Diagnosis Date  . Personal history of fibromyalgia 2002  . Unspecified constipation   . H/O cystitis 2011  . Special screening for malignant neoplasms, colon   . Arm fracture     right forearm, d/t fall  . Personal history of malignant neoplasm of large intestine   . Bowel trouble 2010  . Cancer [facility]) 12/2009    Left UOQ breast tumor; wide excision,sn bx and whole breast radiation. Chemotherapy done. There was only a 35mm area of residual tumor remaining. All sn were negative. This was ER-positive, PR-negative, HER-2 neu not over expressed tumor. The original ultrasound size was 2.6 cm(T2).  . Malignant neoplasm of upper-outer quadrant of female breast [facility]) January 13, 2010    2+ centimeter tumor, neoadjuvant chemotherapy. On wide excision 3 mm area of residual tumor. Sentinel node negative. ER-30%, PR-negative, HER-2 not overexpressing.  . Breast cancer ([place]) 12/2009    left, chemo and radiation    Past Surgical History  Procedure Laterality Date  . Colonoscopy  2010    Dr. Allen Norris, normal  . Portacath placement  2011  . Port-a-cath removal  2013  . Breast surgery Left 2011    wide excision  . Breast biopsy Left 2011  . Breast cyst aspiration  Left 1985  . Skin cancer excision  1980    face  . Tubal ligation  1973  . Abdominal hysterectomy  1974  . Back surgery  1985    bone spurs between 5&6, used bone from left hip  . Colon resection  Sept 2014    [facility]   . Femur fracture surgery Right March 2016  . Joint replacement Bilateral Jan 2016 and March 2016    hip    There were no vitals filed for this visit.  Visit Diagnosis:  Difficulty walking  Muscle weakness (generalized)  Right hip pain      Subjective Assessment - 11/18/14 1008    Subjective Patient reports feeling stiff for the last two days, but felt pretty good this morning.    Patient is accompained by: Family member   Pertinent History Patient reports she fell in the Fall 2015 and then fractured right hip with THR Fall 2015 and while she was recuperating from this she fell and fractured her femur and underwent right hip revision and rod in right femur 04/04/2014. She was seen by home health physical therapy x 8 weeks and is now referred to out patient physical therapy for continued rehabiliation.    Patient Stated Goals Patient would like to be able to move more easily, dress herself and walk with less difficulty.    Currently in Pain? Yes   Pain Score 2  Pain Location Hip   Pain Orientation Right   Pain Descriptors / Indicators Sore;Tightness   Pain Type Chronic pain   Pain Onset More than a month ago   Pain Frequency Intermittent   Multiple Pain Sites No                     Adult Aquatic Therapy - 11/18/14 1009    Aquatic Therapy Subjective   Subjective Continued stiffness in B hip area.    Treatment   Gait Entered/exited pool via ramp with handrail assist with increased time and effort required as well as close sba for safety.  Perrformed 5 laps forward walking with noodle support across pool with close supervision and assist for balance as needed. 1 lap sidestepping also at wall with rail for safety.    Exercises Standing exercises to  include marching, hip abduction, hip extension, hip abduction with knee and hip at 90 degrees, hip circles x 1 min each.    Specific Exercises Hip/Low Back   Hip/Low Back Seated exercises to include, laq, abd/add, and bicycles x 2 min each.                     PT Education - 11/18/14 1010    Education provided Yes   Education Details proper form with exercises, safety   Person(s) Educated Patient   Methods Explanation   Comprehension Verbalized understanding             PT Long Term Goals - 10/31/14 1234    PT LONG TERM GOAL #1   Title patient will demonstrate decreased fall risk by TUG of 20 seconds or better by 12/05/2014   Baseline Curent TUG 26.5 seconds  (was 30 seconds 10/17/14)   Status New   PT LONG TERM GOAL #2   Title Patient will improve 10 MW score to 13 seconds or less indicating improved community ambulation and decrease fall risk by 12/05/2014   Baseline 16 seconds 10/31/14 = 15 seconds   Status Revised   PT LONG TERM GOAL #3   Title Patient will be able to ambulate without assistive device for short household ambulation and appropriate assistive device outside the home for more independence in the community by 12/05/2014   Baseline Patient is ambulating within one room a few steps without assistive device   Status On-going   PT LONG TERM GOAL #4   Title Patient will improve LEFS to 40/64 or better by 12/05/2014 demonstrating improved function with daily tasks involving LE   Baseline 28/64   Status Revised   PT LONG TERM GOAL #5   Title Patient will be independent with home program without verbal cuing and be able to self manage symptoms and exercises by 12/05/2014   Baseline Patient has limited knowledg of appropriate exercises and progression to improve function and self manage symptoms at home   Status Revised               Plan - 11/18/14 1011    Clinical Impression Statement Patient had good endurance for ambulation this session.  However did fatigue after about 30 minutes of activity.   Pt will benefit from skilled therapeutic intervention in order to improve on the following deficits Difficulty walking;Decreased endurance;Pain;Decreased strength;Impaired flexibility   Rehab Potential Good   Clinical Impairments Affecting Rehab Potential (+) motivated, family support   (-) multiple comorbidities: history of CA, colon resection, THR and subsequent fall with femur fracture and THR revision  PT Frequency 3x / week   PT Duration 6 weeks   PT Treatment/Interventions Patient/family education;Therapeutic exercise;Manual techniques;Moist Heat;Cryotherapy;Aquatic Therapy   PT Next Visit Plan aquatic therapy and manual therapy to help with spasms, and pain, therapeutic exercise for strength and endurance   Consulted and Agree with Plan of Care Patient        Problem List Patient Active Problem List   Diagnosis Date Noted  . Skin lesion of chest wall 07/17/2014  . Breast cancer ([place]) 03/07/2013  . Personal history of malignant neoplasm of large intestine   . Malignant neoplasm of upper-outer quadrant of female breast ([place])   . Cancer ([place]) 12/24/2009  . ANXIETY 06/11/2008  . CONSTIPATION, CHRONIC 06/11/2008  . FIBROMYALGIA 06/11/2008    Lui Bellis, PT, MPT, GCS 11/18/2014, 10:13 AM  [place] [facility] [address] [place], [address] Phone: [phone]   Fax:  [phone]  Name: Terri Perez MRN: [id number] Date of Birth: 18-Nov-1945

## 2014-11-20 ENCOUNTER — Ambulatory Visit: Payer: Medicare Other | Admitting: Physical Therapy

## 2014-11-20 DIAGNOSIS — M25551 Pain in right hip: Secondary | ICD-10-CM

## 2014-11-20 DIAGNOSIS — M6281 Muscle weakness (generalized): Secondary | ICD-10-CM

## 2014-11-20 DIAGNOSIS — R531 Weakness: Secondary | ICD-10-CM | POA: Diagnosis not present

## 2014-11-20 DIAGNOSIS — M25659 Stiffness of unspecified hip, not elsewhere classified: Secondary | ICD-10-CM

## 2014-11-20 DIAGNOSIS — R262 Difficulty in walking, not elsewhere classified: Secondary | ICD-10-CM | POA: Diagnosis not present

## 2014-11-20 NOTE — Therapy (Signed)
[place] [facility] [address], [place], [address] Phone: [phone]   Fax:  [phone]  Physical Therapy Treatment  Patient Details  Name: Terri Perez MRN: [id number] Date of Birth: Jun 10, 1945 No Data Recorded  Encounter Date: 11/20/2014      PT End of Session - 11/20/14 1142    Visit Number 35   Number of Visits 43   Date for PT Re-Evaluation 12/05/14   Authorization Type 35   Authorization Time Period 40   PT Start Time 0925   PT Stop Time 1008   PT Time Calculation (min) 43 min   Activity Tolerance No increased pain;Patient limited by fatigue   Behavior During Therapy [facility] for tasks assessed/performed      Past Medical History  Diagnosis Date  . Personal history of fibromyalgia 2002  . Unspecified constipation   . H/O cystitis 2011  . Special screening for malignant neoplasms, colon   . Arm fracture     right forearm, d/t fall  . Personal history of malignant neoplasm of large intestine   . Bowel trouble 2010  . Cancer [facility]) 12/2009    Left UOQ breast tumor; wide excision,sn bx and whole breast radiation. Chemotherapy done. There was only a 69m area of residual tumor remaining. All sn were negative. This was ER-positive, PR-negative, HER-2 neu not over expressed tumor. The original ultrasound size was 2.6 cm(T2).  . Malignant neoplasm of upper-outer quadrant of female breast ([facility] January 13, 2010    2+ centimeter tumor, neoadjuvant chemotherapy. On wide excision 3 mm area of residual tumor. Sentinel node negative. ER-30%, PR-negative, HER-2 not overexpressing.  . [place] 12/[place]    left, chemo and radiation    Past Surgical History  Procedure Laterality Date  . Colonoscopy  2010    Dr. WAllen Norris normal  . Portacath placement  2011  . Port-a-cath removal  2013  . Breast surgery Left 2011    wide excision  . Breast biopsy Left 2011  . Breast cyst aspiration Left 1985  . Skin  cancer excision  1980    face  . Tubal ligation  1973  . Abdominal hysterectomy  1974  . Back surgery  1985    bone spurs between 5&6, used bone from left hip  . [facility]  . Femur fracture surgery Right March 2016  . Joint replacement Bilateral Jan 2016 and March 2016    hip    There were no vitals filed for this visit.  Visit Diagnosis:  Difficulty walking  Muscle weakness (generalized)  Right hip pain  Hip stiffness, unspecified laterality      Subjective Assessment - 11/20/14 1139    Subjective "I'm just tired." Patient reports soreness in hip initially that improved during session.    Patient is accompained by: Family member   Pertinent History Patient reports she fell in the Fall 2015 and then fractured right hip with THR Fall 2015 and while she was recuperating from this she fell and fractured her femur and underwent right hip revision and rod in right femur 04/04/2014. She was seen by home health physical therapy x 8 weeks and is now referred to out patient physical therapy for continued rehabiliation.    Patient Stated Goals Patient would like to be able to move more easily, dress herself and walk with less difficulty.    Currently in Pain? No/denies  Adult Aquatic Therapy - 11/20/14 1140    Aquatic Therapy Subjective   Subjective Continued stiffness in B hip area.    Treatment   Gait Entered/exited pool via ramp with handrail assist with increased time and effort required as well as close sba for safety.  Perrformed 5 laps forward walking with noodle support across pool with close supervision and assist for balance as needed. 1 lap sidestepping also at wall with rail for safety.    Exercises Standing exercises to include marching, hip abduction, hip extension, hip abduction with knee and hip at 90 degrees, hip circles x 1 min each.    Specific Exercises Hip/Low Back   Hip/Low Back Seated exercises to  include, laq, abd/add, and bicycles x 2 min each.                     PT Education - 11/20/14 1141    Education provided Yes   Education Details proper form, safety   Person(s) Educated Patient   Methods Explanation   Comprehension Verbalized understanding             PT Long Term Goals - 10/31/14 1234    PT LONG TERM GOAL #1   Title patient will demonstrate decreased fall risk by TUG of 20 seconds or better by 12/05/2014   Baseline Curent TUG 26.5 seconds  (was 30 seconds 10/17/14)   Status New   PT LONG TERM GOAL #2   Title Patient will improve 10 MW score to 13 seconds or less indicating improved community ambulation and decrease fall risk by 12/05/2014   Baseline 16 seconds 10/31/14 = 15 seconds   Status Revised   PT LONG TERM GOAL #3   Title Patient will be able to ambulate without assistive device for short household ambulation and appropriate assistive device outside the home for more independence in the community by 12/05/2014   Baseline Patient is ambulating within one room a few steps without assistive device   Status On-going   PT LONG TERM GOAL #4   Title Patient will improve LEFS to 40/64 or better by 12/05/2014 demonstrating improved function with daily tasks involving LE   Baseline 28/64   Status Revised   PT LONG TERM GOAL #5   Title Patient will be independent with home program without verbal cuing and be able to self manage symptoms and exercises by 12/05/2014   Baseline Patient has limited knowledg of appropriate exercises and progression to improve function and self manage symptoms at home   Status Revised               Plan - 11/20/14 1143    Clinical Impression Statement Patient demonstrates improved ambulation, continues to have decreased balance however she is able to regain balance without my assistance.    Pt will benefit from skilled therapeutic intervention in order to improve on the following deficits Difficulty  walking;Decreased endurance;Pain;Decreased strength;Impaired flexibility   Rehab Potential Good   Clinical Impairments Affecting Rehab Potential (+) motivated, family support   (-) multiple comorbidities: history of CA, colon resection, THR and subsequent fall with femur fracture and THR revision    PT Frequency 3x / week   PT Duration 6 weeks   PT Treatment/Interventions Patient/family education;Therapeutic exercise;Manual techniques;Moist Heat;Cryotherapy;Aquatic Therapy   PT Next Visit Plan aquatic therapy and manual therapy to help with spasms, and pain, therapeutic exercise for strength and endurance   Consulted and Agree with Plan of Care Patient  Problem List Patient Active Problem List   Diagnosis Date Noted  . Skin lesion of chest wall 07/17/2014  . Breast cancer ([place]) 03/07/2013  . Personal history of malignant neoplasm of large intestine   . 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 Cancer ([place]) 12/24/2009  . ANXIETY 06/11/2008  . CONSTIPATION, CHRONIC 06/11/2008  . FIBROMYALGIA 06/11/2008    Oland Arquette, PT, MPT, GCS 11/20/2014, 11:45 AM  [place] [facility] [address] [place], [address] Phone: [phone]   Fax:  [phone]  Name: Terri Perez MRN: [id number] Date of Birth: 07/04/1945

## 2014-11-25 ENCOUNTER — Ambulatory Visit: Payer: Medicare Other | Attending: Student | Admitting: Physical Therapy

## 2014-11-25 DIAGNOSIS — R262 Difficulty in walking, not elsewhere classified: Secondary | ICD-10-CM | POA: Insufficient documentation

## 2014-11-25 DIAGNOSIS — M6281 Muscle weakness (generalized): Secondary | ICD-10-CM

## 2014-11-25 DIAGNOSIS — M25659 Stiffness of unspecified hip, not elsewhere classified: Secondary | ICD-10-CM | POA: Diagnosis not present

## 2014-11-25 DIAGNOSIS — M25551 Pain in right hip: Secondary | ICD-10-CM | POA: Diagnosis not present

## 2014-11-25 NOTE — Therapy (Signed)
[place] [facility] [address] [place], [address] Phone: [phone]   Fax:  [phone]  Physical Therapy Treatment  Patient Details  Name: Terri Perez MRN: [id number] Date of Birth: [birth year] No Data Recorded  Encounter Date: 11/25/2014      PT End of Session - 11/25/14 1034    Visit Number 36   Number of Visits 43   Date for PT Re-Evaluation 12/05/14   Authorization Type 36   Authorization Time Period 40   PT Start Time 0925   PT Stop Time 1005   PT Time Calculation (min) 40 min   Activity Tolerance Patient tolerated treatment well;No increased pain;Patient limited by fatigue   Behavior During Therapy [facility] for tasks assessed/performed      Past Medical History  Diagnosis Date  . Personal history of fibromyalgia 2002  . Unspecified constipation   . H/O cystitis 2011  . Special screening for malignant neoplasms, colon   . Arm fracture     right forearm, d/t fall  . Personal history of malignant neoplasm of large intestine   . Bowel trouble 2010  . Cancer [facility]) 12/2009    Left UOQ breast tumor; wide excision,sn bx and whole breast radiation. Chemotherapy done. There was only a 28m area of residual tumor remaining. All sn were negative. This was ER-positive, PR-negative, HER-2 neu not over expressed tumor. The original ultrasound size was 2.6 cm(T2).  . Malignant neoplasm of upper-outer quadrant of female breast ([facility] January 13, 2010    2+ centimeter tumor, neoadjuvant chemotherapy. On wide excision 3 mm area of residual tumor. Sentinel node negative. ER-30%, PR-negative, HER-2 not overexpressing.  . [place]    left, chemo and radiation    Past Surgical History  Procedure Laterality Date  . Colonoscopy  2010    Dr. WAllen Norris normal  . Portacath placement  2011  . Port-a-cath removal  2013  . Breast surgery Left 2011    wide excision  . Breast biopsy Left 2011  . Breast  cyst aspiration Left 1985  . Skin cancer excision  1980    face  . Tubal ligation  1973  . Abdominal hysterectomy  1974  . Back surgery  1985    bone spurs between 5&6, used bone from left hip  . [facility]  . Femur fracture surgery Right March 2016  . Joint replacement Bilateral Jan 2016 and March 2016    hip    There were no vitals filed for this visit.  Visit Diagnosis:  Difficulty walking  Muscle weakness (generalized)  Hip stiffness, unspecified laterality      Subjective Assessment - 11/25/14 1031    Subjective "i'm tired, My hip feels very stiff."   Patient is accompained by: Family member   Pertinent History Patient reports she fell in the Fall 2015 and then fractured right hip with THR Fall 2015 and while she was recuperating from this she fell and fractured her femur and underwent right hip revision and rod in right femur 04/04/2014. She was seen by home health physical therapy x 8 weeks and is now referred to out patient physical therapy for continued rehabiliation.    Limitations Sitting;Walking;House hold activities   Patient Stated Goals Patient would like to be able to move more easily, dress herself and walk with less difficulty.    Currently in Pain? No/denies  Adult Aquatic Therapy - 11/25/14 1032    Aquatic Therapy Subjective   Subjective Stiffnes in hip, tired.   Treatment   Gait Entered/exited pool via ramp with handrail assist with increased time and effort required as well as close sba for safety.  Perrformed 5 laps forward walking with noodle support across pool with close supervision and assist for balance as needed. 1 lap sidestepping also at wall with rail for safety.    Exercises Standing exercises to include marching, hip abduction, hip extension, hip abduction with knee and hip at 90 degrees, hip circles x 1 min each.    Specific Exercises Hip/Low Back   Hip/Low Back Seated exercises to  include, laq, abd/add, and bicycles x 2 min each.                     PT Education - 11/25/14 1033    Education provided Yes   Education Details safety, proper technique for exercises and on/off bench in pool   Person(s) Educated Patient   Methods Explanation;Demonstration   Comprehension Verbalized understanding;Returned demonstration             PT Long Term Goals - 10/31/14 1234    PT LONG TERM GOAL #1   Title patient will demonstrate decreased fall risk by TUG of 20 seconds or better by 12/05/2014   Baseline Curent TUG 26.5 seconds  (was 30 seconds 10/17/14)   Status New   PT LONG TERM GOAL #2   Title Patient will improve 10 MW score to 13 seconds or less indicating improved community ambulation and decrease fall risk by 12/05/2014   Baseline 16 seconds 10/31/14 = 15 seconds   Status Revised   PT LONG TERM GOAL #3   Title Patient will be able to ambulate without assistive device for short household ambulation and appropriate assistive device outside the home for more independence in the community by 12/05/2014   Baseline Patient is ambulating within one room a few steps without assistive device   Status On-going   PT LONG TERM GOAL #4   Title Patient will improve LEFS to 40/64 or better by 12/05/2014 demonstrating improved function with daily tasks involving LE   Baseline 28/64   Status Revised   PT LONG TERM GOAL #5   Title Patient will be independent with home program without verbal cuing and be able to self manage symptoms and exercises by 12/05/2014   Baseline Patient has limited knowledg of appropriate exercises and progression to improve function and self manage symptoms at home   Status Revised               Plan - 11/25/14 1034    Clinical Impression Statement Patient reports improvement with muscle stiffness after session. Has difficulty progressing further in the water at this time. Continues to require noodle for balance assist with  ambulation in pool.    Pt will benefit from skilled therapeutic intervention in order to improve on the following deficits Difficulty walking;Decreased endurance;Pain;Decreased strength;Impaired flexibility   Rehab Potential Good   Clinical Impairments Affecting Rehab Potential (+) motivated, family support   (-) multiple comorbidities: history of CA, colon resection, THR and subsequent fall with femur fracture and THR revision    PT Frequency 3x / week   PT Duration 6 weeks   PT Treatment/Interventions Patient/family education;Therapeutic exercise;Manual techniques;Moist Heat;Cryotherapy;Aquatic Therapy   PT Next Visit Plan aquatic therapy and manual therapy to help with spasms, and pain, therapeutic exercise for strength and  endurance   Consulted and Agree with Plan of Care Patient        Problem List Patient Active Problem List   Diagnosis Date Noted  . Skin lesion of chest wall 07/17/2014  . Breast cancer ([place]) 03/07/2013  . Personal history of malignant neoplasm of large intestine   . Malignant neoplasm of upper-outer quadrant of female breast ([place])   . Cancer ([place]) 12/24/2009  . ANXIETY 06/11/2008  . CONSTIPATION, CHRONIC 06/11/2008  . FIBROMYALGIA 06/11/2008    Lundynn Cohoon, PT, MPT, GCS 11/25/2014, 10:37 AM  [place] [facility] [address] [place], [address] Phone: [phone]   Fax:  [phone]  Name: Terri Perez MRN: [id number] Date of Birth: 03/09/1945

## 2014-11-27 ENCOUNTER — Ambulatory Visit: Payer: Medicare Other | Admitting: Physical Therapy

## 2014-11-27 DIAGNOSIS — M25659 Stiffness of unspecified hip, not elsewhere classified: Secondary | ICD-10-CM | POA: Diagnosis not present

## 2014-11-27 DIAGNOSIS — M6281 Muscle weakness (generalized): Secondary | ICD-10-CM

## 2014-11-27 DIAGNOSIS — R262 Difficulty in walking, not elsewhere classified: Secondary | ICD-10-CM

## 2014-11-27 DIAGNOSIS — M25551 Pain in right hip: Secondary | ICD-10-CM | POA: Diagnosis not present

## 2014-11-27 NOTE — Therapy (Signed)
[place] [address] [place], [address] Phone: [phone]   Fax:  [phone]  Physical Therapy Treatment  Patient Details  Name: Terri Perez MRN: [id number] Date of Birth: 01/18/1946 No Data Recorded  Encounter Date: 11/27/2014      PT End of Session - 11/27/14 1049    Visit Number 37   Number of Visits 43   Date for PT Re-Evaluation 12/05/14   Authorization Type 37   Authorization Time Period 40   PT Start Time 1000   PT Stop Time 1040   PT Time Calculation (min) 40 min   Activity Tolerance Patient tolerated treatment well;No increased pain;Patient limited by fatigue   Behavior During Therapy [facility] for tasks assessed/performed      Past Medical History  Diagnosis Date  . Personal history of fibromyalgia 2002  . Unspecified constipation   . H/O cystitis 2011  . Special screening for malignant neoplasms, colon   . Arm fracture     right forearm, d/t fall  . Personal history of malignant neoplasm of large intestine   . Bowel trouble 2010  . Cancer [facility]) 12/2009    Left UOQ breast tumor; wide excision,sn bx and whole breast radiation. Chemotherapy done. There was only a 73m area of residual tumor remaining. All sn were negative. This was ER-positive, PR-negative, HER-2 neu not over expressed tumor. The original ultrasound size was 2.6 cm(T2).  . Malignant neoplasm of upper-outer quadrant of female breast ([facility] January 13, 2010    2+ centimeter tumor, neoadjuvant chemotherapy. On wide excision 3 mm area of residual tumor. Sentinel node negative. ER-30%, PR-negative, HER-2 not overexpressing.  . [place]    left, chemo and radiation    Past Surgical History  Procedure Laterality Date  . Colonoscopy  2010    Dr. WAllen Norris normal  . Portacath placement  2011  . Port-a-cath removal  2013  . Breast surgery Left 2011    wide excision  . Breast biopsy Left 2011  . Breast  cyst aspiration Left 1985  . Skin cancer excision  1980    face  . Tubal ligation  1973  . Abdominal hysterectomy  1974  . Back surgery  1985    bone spurs between 5&6, used bone from left hip  . [facility]  . Femur fracture surgery Right March 2016  . Joint replacement Bilateral Jan 2016 and March 2016    hip    There were no vitals filed for this visit.  Visit Diagnosis:  Difficulty walking  Muscle weakness (generalized)  Hip stiffness, unspecified laterality      Subjective Assessment - 11/27/14 1046    Subjective Patient reports hip stiffness, fatigue.    Patient is accompained by: Family member   Pertinent History Patient reports she fell in the Fall 2015 and then fractured right hip with THR Fall 2015 and while she was recuperating from this she fell and fractured her femur and underwent right hip revision and rod in right femur 04/04/2014. She was seen by home health physical therapy x 8 weeks and is now referred to out patient physical therapy for continued rehabiliation.    Patient Stated Goals Patient would like to be able to move more easily, dress herself and walk with less difficulty.    Currently in Pain? No/denies  Adult Aquatic Therapy - 11/27/14 1047    Aquatic Therapy Subjective   Subjective Reports stiffness in hip ( right) and fatigue.    Treatment   Gait Entered/exited pool via ramp with handrail assist with increased time and effort required as well as close sba for safety.  Perrformed 5 laps forward walking with noodle support across pool with close supervision and assist for balance as needed. 1 lap sidestepping also at wall with rail for safety.    Exercises Standing exercises to include marching, hip abduction, hip extension, hip abduction with knee and hip at 90 degrees, hip circles x 1 min each.bilaterally. Trying to increase stance and endurance time on right while performing exercises on  left.    Specific Exercises Hip/Low Back   Hip/Low Back Seated exercises to include, laq, abd/add, and bicycles x 2 min each.                     PT Education - 11/27/14 1048    Education provided Yes   Education Details safety, importance of increaseing right leg endurance in standing for improved stance time with walking and improved tolerance of standing on right LE.    Person(s) Educated Patient   Methods Explanation   Comprehension Verbalized understanding             PT Long Term Goals - 10/31/14 1234    PT LONG TERM GOAL #1   Title patient will demonstrate decreased fall risk by TUG of 20 seconds or better by 12/05/2014   Baseline Curent TUG 26.5 seconds  (was 30 seconds 10/17/14)   Status New   PT LONG TERM GOAL #2   Title Patient will improve 10 MW score to 13 seconds or less indicating improved community ambulation and decrease fall risk by 12/05/2014   Baseline 16 seconds 10/31/14 = 15 seconds   Status Revised   PT LONG TERM GOAL #3   Title Patient will be able to ambulate without assistive device for short household ambulation and appropriate assistive device outside the home for more independence in the community by 12/05/2014   Baseline Patient is ambulating within one room a few steps without assistive device   Status On-going   PT LONG TERM GOAL #4   Title Patient will improve LEFS to 40/64 or better by 12/05/2014 demonstrating improved function with daily tasks involving LE   Baseline 28/64   Status Revised   PT LONG TERM GOAL #5   Title Patient will be independent with home program without verbal cuing and be able to self manage symptoms and exercises by 12/05/2014   Baseline Patient has limited knowledg of appropriate exercises and progression to improve function and self manage symptoms at home   Status Revised               Plan - 11/27/14 1050    Clinical Impression Statement Patient reports muscle/right hip stiffness. Improves  during session. Patient is limited by fatigue and endurance during aquatic session.    Pt will benefit from skilled therapeutic intervention in order to improve on the following deficits Difficulty walking;Decreased endurance;Pain;Decreased strength;Impaired flexibility   Rehab Potential Good   Clinical Impairments Affecting Rehab Potential (+) motivated, family support   (-) multiple comorbidities: history of CA, colon resection, THR and subsequent fall with femur fracture and THR revision    PT Frequency 3x / week   PT Duration 6 weeks   PT Treatment/Interventions Patient/family education;Therapeutic exercise;Manual techniques;Moist Heat;Cryotherapy;Aquatic Therapy  PT Next Visit Plan aquatic therapy and manual therapy to help with spasms, and pain, therapeutic exercise for strength and endurance   Consulted and Agree with Plan of Care Patient        Problem List Patient Active Problem List   Diagnosis Date Noted  . Skin lesion of chest wall 07/17/2014  . Breast cancer ([place]) 03/07/2013  . Personal history of malignant neoplasm of large intestine   . Malignant neoplasm of upper-outer quadrant of female breast ([place])   . Cancer ([place]) 12/24/2009  . ANXIETY 06/11/2008  . CONSTIPATION, CHRONIC 06/11/2008  . FIBROMYALGIA 06/11/2008    Zigmond Trela, PT, MPT, GCS 11/27/2014, 10:52 AM  [facility] [address] [place], [address] Phone: [phone]   Fax:  [phone]  Name: GABRIELLAH RABEL MRN: [id number] Date of Birth: 16-Mar-1945

## 2014-11-28 ENCOUNTER — Ambulatory Visit: Payer: Medicare Other | Admitting: Physical Therapy

## 2014-11-28 ENCOUNTER — Encounter: Payer: Self-pay | Admitting: Physical Therapy

## 2014-11-28 DIAGNOSIS — M25551 Pain in right hip: Secondary | ICD-10-CM | POA: Diagnosis not present

## 2014-11-28 DIAGNOSIS — R262 Difficulty in walking, not elsewhere classified: Secondary | ICD-10-CM | POA: Diagnosis not present

## 2014-11-28 DIAGNOSIS — M25659 Stiffness of unspecified hip, not elsewhere classified: Secondary | ICD-10-CM | POA: Diagnosis not present

## 2014-11-28 DIAGNOSIS — M6281 Muscle weakness (generalized): Secondary | ICD-10-CM

## 2014-11-28 NOTE — Therapy (Addendum)
[facility] [address], [place], [address] Phone: [phone]   Fax:  [phone]  Physical Therapy Treatment  Patient Details  Name: Terri Perez MRN: [id number] Date of Birth: 1945-03-30 Referring Provider: Vela Prose, MD  Encounter Date: 11/28/2014      PT End of Session - 12/09/14 1102    Visit Number 41   Number of Visits 43   Date for PT Re-Evaluation 12/05/14   Authorization Type 41   Authorization Time Period 40   PT Start Time 0955   PT Stop Time 6962   PT Time Calculation (min) 40 min   Activity Tolerance Patient tolerated treatment well;No increased pain;Patient limited by fatigue   Behavior During Therapy [facility] for tasks assessed/performed      Past Medical History  Diagnosis Date  . Personal history of fibromyalgia 2002  . Unspecified constipation   . H/O cystitis 2011  . Special screening for malignant neoplasms, colon   . Arm fracture     right forearm, d/t fall  . Personal history of malignant neoplasm of large intestine   . Bowel trouble 2010  . Cancer [facility] 12/2009    Left UOQ breast tumor; wide excision,sn bx and whole breast radiation. Chemotherapy done. There was only a 24m area of residual tumor remaining. All sn were negative. This was ER-positive, PR-negative, HER-2 neu not over expressed tumor. The original ultrasound size was 2.6 cm(T2).  . Malignant neoplasm of upper-outer quadrant of female breast ([facility] January 13, 2010    2+ centimeter tumor, neoadjuvant chemotherapy. On wide excision 3 mm area of residual tumor. Sentinel node negative. ER-30%, PR-negative, HER-2 not overexpressing.  . Breast cancer [place] 12[place]    left, chemo and radiation    Past Surgical History  Procedure Laterality Date  . Colonoscopy  2010    Dr. WAllen Norris normal  . Portacath placement  2011  . Port-a-cath removal  2013  . Breast surgery Left 2011    wide excision  . Breast  biopsy Left 2011  . Breast cyst aspiration Left 1985  . Skin cancer excision  1980    face  . Tubal ligation  1973  . Abdominal hysterectomy  1974  . Back surgery  1985    bone spurs between 5&6, used bone from left hip  . [facility]  . Femur fracture surgery Right March 2016  . Joint replacement Bilateral Jan 2016 and March 2016    hip    There were no vitals filed for this visit.  Visit Diagnosis:  Difficulty walking - Plan: PT plan of care cert/re-cert  Muscle weakness (generalized) - Plan: PT plan of care cert/re-cert  Right hip pain - Plan: PT plan of care cert/re-cert      Subjective Assessment - 11/28/14 1116    Subjective Patient reports she feels she is doing well with aquatic therapy exercises and feels stronger. She is still sore in right groin/hip region and has weakness that limits her from being able to stand and walk like she wants to. Overall she feels she is improving. She is still moving slowly and has difficulty getting in/out of car and with household chores that require standing.    Limitations Sitting;Walking;House hold activities   Patient Stated Goals Patient would like to be able to move more easily, dress herself and walk with less difficulty.    Currently in Pain? No/denies  Objective: Observation: Gait: ambulating with rolling walker with improved cadence, occasional right LE spasms during swing through resulting in LE catching on surface, instructed patient ro take smaller steps and move with a steady pace and move more slowly to avoid right LE to go into spasm Palpation: right LE: + spasms along central thigh/quadriceps muscle and lateral thigh along ITB and lateral quadriceps and calf muscle  Treatment:  Re assessed TUG, 10MW and sit to stand activities: TUG 24.2 seconds, 5x sit to stand 13.4 seconds, 10 MW 13 seconds, re assessed home exercises to continue for LE strengthening including hip adduction, hip  stabilization and walking activities.  Manual therapy soft tissue mobilization to right thigh/hipnd calf musculature with patient in sitting to improved soft tissue elasticity and decrease pain and spasms for improved walking    Patient response to treatment: Patient improved all reassessment times from previous assessment by 3 seconds for 10MW, by 2 seconds for TUG and 5x sit to stand by 10 seconds. She requires assistance and guidance with exercises, improved sit to stand and soft tissue elasticity allowing improved ability to walk with less spasms           PT Education - 11/28/14 1145    Education provided Yes   Education Details instructed patient to continue with home exercises and aquatic exercises for strengthening and to walk  a steady pace for safety and to pick up her right LE more to clear floor   Person(s) Educated Patient   Methods Explanation;Verbal cues   Comprehension Verbalized understanding;Returned demonstration;Verbal cues required             PT Long Term Goals - 11/28/14 1200    PT LONG TERM GOAL #1   Title patient will demonstrate decreased fall risk by TUG of 20 seconds or better by 01/16/2015   Baseline Curent TUG 26.5 seconds  (was 30 seconds 10/17/14) (24.2 seconds: 11/28/2014)   Status Revised   PT LONG TERM GOAL #2   Title Patient will improve 10 MW score to 13 seconds or less indicating improved community ambulation and decrease fall risk by 12/05/2014   Baseline 16 seconds 10/31/14 = 15 seconds (11/28/2014 13 seconds)    Status Achieved   PT LONG TERM GOAL #3   Title Patient will be able to ambulate without assistive device for short household ambulation and appropriate assistive device outside the home for more independence in the community by 01/16/2015   Baseline Patient is ambulating within one room a few steps without assistive device   Status Partially Met   PT LONG TERM GOAL #4   Title Patient will improve LEFS to 40/64 or better by  12/05/2014 demonstrating improved function with daily tasks involving LE   Baseline 28/64   Status On-going   PT LONG TERM GOAL #5   Title Patient will be independent with home program without verbal cuing and be able to self manage symptoms and exercises by 01/16/2015   Baseline Patient has limited knowledg of appropriate exercises and progression to improve function and self manage symptoms at home   Status Revised   Additional Long Term Goals   Additional Long Term Goals Yes   PT LONG TERM GOAL #6   Title Patient will improve 10 MW score to 11 seconds or less indicating improved community ambulation and decrease fall risk by 01/16/2015   Baseline 16 seconds 10/31/14 = 15 seconds (11/28/2014 13 seconds)    Status Revised  Plan - 11/28/14 1116    Clinical Impression Statement Progressing steadily. Patient continues with difficulty with most tasks with personal care and household activities. TUG improved to 24.4 seconds, 10 MW to 13 seconds, and sit to stand x 5 reps from 20" hieight to 14 seconds demonstrating improvement in strength and endurance. She continues with significant weakness and difficulty with performing daily tasks and will benefit from additional physical therapy intervention with aquatic therapy exercises.   Pt will benefit from skilled therapeutic intervention in order to improve on the following deficits Difficulty walking;Decreased endurance;Pain;Decreased strength;Impaired flexibility   Rehab Potential Good   PT Frequency 3x / week   PT Duration 6 weeks   PT Treatment/Interventions Patient/family education;Therapeutic exercise;Manual techniques;Moist Heat;Cryotherapy;Aquatic Therapy        Problem List Patient Active Problem List   Diagnosis Date Noted  . Skin lesion of chest wall 07/17/2014  . Breast cancer ([place]) 03/07/2013  . Personal history of malignant neoplasm of large intestine   . Malignant neoplasm of upper-outer quadrant of female  breast ([place])   . Cancer ([place]) 12/24/2009  . ANXIETY 06/11/2008  . CONSTIPATION, CHRONIC 06/11/2008  . FIBROMYALGIA 06/11/2008    Jomarie Longs PT 12/10/2014, 3:56 PM  [facility] [address] [place], [address] Phone: [phone]   Fax:  [phone]  Name: Terri Perez MRN: [id number] Date of Birth: [birth year]

## 2014-12-02 ENCOUNTER — Ambulatory Visit: Payer: Medicare Other | Admitting: Physical Therapy

## 2014-12-02 DIAGNOSIS — M25551 Pain in right hip: Secondary | ICD-10-CM | POA: Diagnosis not present

## 2014-12-02 DIAGNOSIS — M6281 Muscle weakness (generalized): Secondary | ICD-10-CM

## 2014-12-02 DIAGNOSIS — R262 Difficulty in walking, not elsewhere classified: Secondary | ICD-10-CM

## 2014-12-02 DIAGNOSIS — M25659 Stiffness of unspecified hip, not elsewhere classified: Secondary | ICD-10-CM | POA: Diagnosis not present

## 2014-12-02 NOTE — Therapy (Signed)
[facility] [address], [place], [address] Phone: [phone]   Fax:  [phone]  Physical Therapy Treatment  Patient Details  Name: Terri Perez MRN: [id number] Date of Birth: January 26, 1945 No Data Recorded  Encounter Date: 12/02/2014      PT End of Session - 12/02/14 1023    Visit Number 39   Number of Visits 43   Date for PT Re-Evaluation 12/05/14   Authorization Type 39   Authorization Time Period 40   PT Start Time 0857   PT Stop Time 0935   PT Time Calculation (min) 38 min   Activity Tolerance Patient tolerated treatment well;No increased pain;Patient limited by fatigue   Behavior During Therapy [facility] for tasks assessed/performed      Past Medical History  Diagnosis Date  . Personal history of fibromyalgia 2002  . Unspecified constipation   . H/O cystitis 2011  . Special screening for malignant neoplasms, colon   . Arm fracture     right forearm, d/t fall  . Personal history of malignant neoplasm of large intestine   . Bowel trouble 2010  . Cancer [facility]) 12/2009    Left UOQ breast tumor; wide excision,sn bx and whole breast radiation. Chemotherapy done. There was only a 74m area of residual tumor remaining. All sn were negative. This was ER-positive, PR-negative, HER-2 neu not over expressed tumor. The original ultrasound size was 2.6 cm(T2).  . Malignant neoplasm of upper-outer quadrant of female breast ([facility] January 13, 2010    2+ centimeter tumor, neoadjuvant chemotherapy. On wide excision 3 mm area of residual tumor. Sentinel node negative. ER-30%, PR-negative, HER-2 not overexpressing.  . [place]    left, chemo and radiation    Past Surgical History  Procedure Laterality Date  . Colonoscopy  2010    Dr. WAllen Norris normal  . Portacath placement  2011  . Port-a-cath removal  2013  . Breast surgery Left 2011    wide excision  . Breast biopsy Left 2011  . Breast  cyst aspiration Left 1985  . Skin cancer excision  1980    face  . Tubal ligation  1973  . Abdominal hysterectomy  1974  . Back surgery  1985    bone spurs between 5&6, used bone from left hip  . [facility]  . Femur fracture surgery Right March 2016  . Joint replacement Bilateral Jan 2016 and March 2016    hip    There were no vitals filed for this visit.  Visit Diagnosis:  Difficulty walking  Muscle weakness (generalized)  Right hip pain  Hip stiffness, unspecified laterality      Subjective Assessment - 12/02/14 1021    Subjective Patient reports she feels tight this morning. Says she stood and cleaned yesterday.    Patient is accompained by: Family member   Pertinent History Patient reports she fell in the Fall 2015 and then fractured right hip with THR Fall 2015 and while she was recuperating from this she fell and fractured her femur and underwent right hip revision and rod in right femur 04/04/2014. She was seen by home health physical therapy x 8 weeks and is now referred to out patient physical therapy for continued rehabiliation.    Patient Stated Goals Patient would like to be able to move more easily, dress herself and walk with less difficulty.    Currently in Pain? No/denies  Adult Aquatic Therapy - 12/02/14 1022    Aquatic Therapy Subjective   Subjective Reports stiffness in hip ( right) and fatigue.    Treatment   Gait Entered/exited pool via ramp with handrail assist with increased time and effort required as well as close sba for safety.  Perrformed 5 laps forward walking with noodle support across pool with close supervision and assist for balance as needed. 1 lap sidestepping also at wall with rail for safety.    Exercises Standing exercises to include marching, hip abduction, hip extension, hip abduction with knee and hip at 90 degrees, hip circles x 1 min each.bilaterally. Trying to increase stance  and endurance time on right while performing exercises on left.    Specific Exercises Hip/Low Back   Hip/Low Back Seated exercises to include, laq, abd/add, and bicycles x 2 min each.                     PT Education - 12/02/14 1022    Education provided Yes   Education Details safety with [employer]) Educated Patient   Methods Explanation   Comprehension Verbalized understanding             PT Long Term Goals - 11/28/14 1200    PT LONG TERM GOAL #1   Title patient will demonstrate decreased fall risk by TUG of 20 seconds or better by 12/05/2014   Baseline Curent TUG 26.5 seconds  (was 30 seconds 10/17/14) (24.2 seconds: 11/28/2014)   Status Partially Met   PT LONG TERM GOAL #2   Title Patient will improve 10 MW score to 13 seconds or less indicating improved community ambulation and decrease fall risk by 12/05/2014   Baseline 16 seconds 10/31/14 = 15 seconds (11/28/2014 13 seconds)    Status Achieved   PT LONG TERM GOAL #3   Title Patient will be able to ambulate without assistive device for short household ambulation and appropriate assistive device outside the home for more independence in the community by 12/05/2014   Baseline Patient is ambulating within one room a few steps without assistive device   Status Partially Met   PT LONG TERM GOAL #4   Title Patient will improve LEFS to 40/64 or better by 12/05/2014 demonstrating improved function with daily tasks involving LE   Baseline 28/64   Status On-going   PT LONG TERM GOAL #5   Title Patient will be independent with home program without verbal cuing and be able to self manage symptoms and exercises by 12/05/2014   Baseline Patient has limited knowledg of appropriate exercises and progression to improve function and self manage symptoms at home   Status On-going               Plan - 12/02/14 1024    Clinical Impression Statement Patient continues to feel relief from stiffness during  aquatic activities.    Pt will benefit from skilled therapeutic intervention in order to improve on the following deficits Difficulty walking;Decreased endurance;Pain;Decreased strength;Impaired flexibility   Rehab Potential Good   Clinical Impairments Affecting Rehab Potential (+) motivated, family support   (-) multiple comorbidities: history of CA, colon resection, THR and subsequent fall with femur fracture and THR revision    PT Frequency 3x / week   PT Duration 6 weeks   PT Treatment/Interventions Patient/family education;Therapeutic exercise;Manual techniques;Moist Heat;Cryotherapy;Aquatic Therapy   PT Next Visit Plan aquatic therapy and manual therapy to help with spasms, and pain, therapeutic exercise for strength  and endurance   Consulted and Agree with Plan of Care Patient        Problem List Patient Active Problem List   Diagnosis Date Noted  . Skin lesion of chest wall 07/17/2014  . Breast cancer ([place]) 03/07/2013  . Personal history of malignant neoplasm of large intestine   . 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 Cancer ([place]) 12/24/2009  . ANXIETY 06/11/2008  . CONSTIPATION, CHRONIC 06/11/2008  . FIBROMYALGIA 06/11/2008    Caylan Chenard, PT, MPT, GCS 12/02/2014, 10:25 AM  [place] [address], [place], [address] Phone: [phone]   Fax:  [phone]  Name: RAIYAH SPEAKMAN MRN: [id number] Date of Birth: November 26, 1945

## 2014-12-04 ENCOUNTER — Ambulatory Visit: Payer: Medicare Other | Admitting: Physical Therapy

## 2014-12-04 DIAGNOSIS — M25659 Stiffness of unspecified hip, not elsewhere classified: Secondary | ICD-10-CM | POA: Diagnosis not present

## 2014-12-04 DIAGNOSIS — R262 Difficulty in walking, not elsewhere classified: Secondary | ICD-10-CM

## 2014-12-04 DIAGNOSIS — M6281 Muscle weakness (generalized): Secondary | ICD-10-CM | POA: Diagnosis not present

## 2014-12-04 DIAGNOSIS — M25551 Pain in right hip: Secondary | ICD-10-CM | POA: Diagnosis not present

## 2014-12-04 NOTE — Therapy (Signed)
[facility] [address], [place], [address] Phone: [phone]   Fax:  [phone]  Physical Therapy Treatment  Patient Details  Name: Terri Perez MRN: [id number] Date of Birth: 1945-08-22 No Data Recorded  Encounter Date: 12/04/2014      PT End of Session - 12/04/14 1055    Visit Number 40   Number of Visits 43   Date for PT Re-Evaluation 12/05/14   Authorization Type 40   Authorization Time Period 40   PT Start Time 1000   PT Stop Time 1040   PT Time Calculation (min) 40 min   Activity Tolerance Patient tolerated treatment well;No increased pain   Behavior During Therapy [facility] for tasks assessed/performed      Past Medical History  Diagnosis Date  . Personal history of fibromyalgia 2002  . Unspecified constipation   . H/O cystitis 2011  . Special screening for malignant neoplasms, colon   . Arm fracture     right forearm, d/t fall  . Personal history of malignant neoplasm of large intestine   . Bowel trouble 2010  . Cancer [facility]) 12/2009    Left UOQ breast tumor; wide excision,sn bx and whole breast radiation. Chemotherapy done. There was only a 19m area of residual tumor remaining. All sn were negative. This was ER-positive, PR-negative, HER-2 neu not over expressed tumor. The original ultrasound size was 2.6 cm(T2).  . Malignant neoplasm of upper-outer quadrant of female breast ([facility] January 13, 2010    2+ centimeter tumor, neoadjuvant chemotherapy. On wide excision 3 mm area of residual tumor. Sentinel node negative. ER-30%, PR-negative, HER-2 not overexpressing.  . [place]    left, chemo and radiation    Past Surgical History  Procedure Laterality Date  . Colonoscopy  2010    Dr. WAllen Norris normal  . Portacath placement  2011  . Port-a-cath removal  2013  . Breast surgery Left 2011    wide excision  . Breast biopsy Left 2011  . Breast cyst aspiration Left 1985   . Skin cancer excision  1980    face  . Tubal ligation  1973  . Abdominal hysterectomy  1974  . Back surgery  1985    bone spurs between 5&6, used bone from left hip  . [facility]  . Femur fracture surgery Right March 2016  . Joint replacement Bilateral Jan 2016 and March 2016    hip    There were no vitals filed for this visit.  Visit Diagnosis:  Difficulty walking  Muscle weakness (generalized)  Hip stiffness, unspecified laterality      Subjective Assessment - 12/04/14 1053    Subjective Patient continues to report tightness in hip.    Pertinent History Patient reports she fell in the Fall 2015 and then fractured right hip with THR Fall 2015 and while she was recuperating from this she fell and fractured her femur and underwent right hip revision and rod in right femur 04/04/2014. She was seen by home health physical therapy x 8 weeks and is now referred to out patient physical therapy for continued rehabiliation.    Patient Stated Goals Patient would like to be able to move more easily, dress herself and walk with less difficulty.    Currently in Pain? No/denies  PT Education - 12/04/14 1054    Education provided Yes   Education Details safety, benefits of different exercises.    Person(s) Educated Patient   Methods Explanation   Comprehension Verbalized understanding             PT Long Term Goals - 11/28/14 1200    PT LONG TERM GOAL #1   Title patient will demonstrate decreased fall risk by TUG of 20 seconds or better by 12/05/2014   Baseline Curent TUG 26.5 seconds  (was 30 seconds 10/17/14) (24.2 seconds: 11/28/2014)   Status Partially Met   PT LONG TERM GOAL #2   Title Patient will improve 10 MW score to 13 seconds or less indicating improved community ambulation and decrease fall risk by 12/05/2014   Baseline 16 seconds 10/31/14 = 15 seconds (11/28/2014 13 seconds)     Status Achieved   PT LONG TERM GOAL #3   Title Patient will be able to ambulate without assistive device for short household ambulation and appropriate assistive device outside the home for more independence in the community by 12/05/2014   Baseline Patient is ambulating within one room a few steps without assistive device   Status Partially Met   PT LONG TERM GOAL #4   Title Patient will improve LEFS to 40/64 or better by 12/05/2014 demonstrating improved function with daily tasks involving LE   Baseline 28/64   Status On-going   PT LONG TERM GOAL #5   Title Patient will be independent with home program without verbal cuing and be able to self manage symptoms and exercises by 12/05/2014   Baseline Patient has limited knowledg of appropriate exercises and progression to improve function and self manage symptoms at home   Status On-going               Problem List Patient Active Problem List   Diagnosis Date Noted  . Skin lesion of chest wall 07/17/2014  . Breast cancer ([place]) 03/07/2013  . Personal history of malignant neoplasm of large intestine   . Malignant neoplasm of upper-outer quadrant of female breast ([place])   . Cancer ([place]) 12/24/2009  . ANXIETY 06/11/2008  . CONSTIPATION, CHRONIC 06/11/2008  . FIBROMYALGIA 06/11/2008    Mkenzie Dotts, PT, MPT, GCS 12/04/2014, 12:38 PM  [facility] [address], [place], [address] Phone: [phone]   Fax:  [phone]  Name: Terri Perez MRN: [id number] Date of Birth: Nov 27, 1945

## 2014-12-04 NOTE — Therapy (Signed)
[facility] [address] [place], [address] Phone: [phone]   Fax:  [phone]  Physical Therapy Treatment  Patient Details  Name: Terri Perez MRN: [id number] Date of Birth: Nov 30, 1945 No Data Recorded  Encounter Date: 12/04/2014      PT End of Session - 12/04/14 1055    Visit Number 40   Number of Visits 43   Date for PT Re-Evaluation 12/05/14   Authorization Type 40   Authorization Time Period 40   PT Start Time 1000   PT Stop Time 1040   PT Time Calculation (min) 40 min   Activity Tolerance Patient tolerated treatment well;No increased pain   Behavior During Therapy [facility] for tasks assessed/performed      Past Medical History  Diagnosis Date  . Personal history of fibromyalgia 2002  . Unspecified constipation   . H/O cystitis 2011  . Special screening for malignant neoplasms, colon   . Arm fracture     right forearm, d/t fall  . Personal history of malignant neoplasm of large intestine   . Bowel trouble 2010  . Cancer [facility]) 12/2009    Left UOQ breast tumor; wide excision,sn bx and whole breast radiation. Chemotherapy done. There was only a 60m area of residual tumor remaining. All sn were negative. This was ER-positive, PR-negative, HER-2 neu not over expressed tumor. The original ultrasound size was 2.6 cm(T2).  . Malignant neoplasm of upper-outer quadrant of female breast ([facility] January 13, 2010    2+ centimeter tumor, neoadjuvant chemotherapy. On wide excision 3 mm area of residual tumor. Sentinel node negative. ER-30%, PR-negative, HER-2 not overexpressing.  . [place]    left, chemo and radiation    Past Surgical History  Procedure Laterality Date  . Colonoscopy  2010    Dr. WAllen Norris normal  . Portacath placement  2011  . Port-a-cath removal  2013  . Breast surgery Left 2011    wide excision  . Breast biopsy Left 2011  . Breast cyst aspiration Left 1985   . Skin cancer excision  1980    face  . Tubal ligation  1973  . Abdominal hysterectomy  1974  . Back surgery  1985    bone spurs between 5&6, used bone from left hip  . [facility]  Sept [facility]  . Femur fracture surgery Right March 2016  . Joint replacement Bilateral Jan 2016 and March 2016    hip    There were no vitals filed for this visit.  Visit Diagnosis:  Difficulty walking  Muscle weakness (generalized)  Hip stiffness, unspecified laterality      Subjective Assessment - 12/04/14 1053    Subjective Patient continues to report tightness in hip.    Pertinent History Patient reports she fell in the Fall 2015 and then fractured right hip with THR Fall 2015 and while she was recuperating from this she fell and fractured her femur and underwent right hip revision and rod in right femur 04/04/2014. She was seen by home health physical therapy x 8 weeks and is now referred to out patient physical therapy for continued rehabiliation.    Patient Stated Goals Patient would like to be able to move more easily, dress herself and walk with less difficulty.    Currently in Pain? No/denies                     Adult  Aquatic Therapy - 12/04/14 1238    Aquatic Therapy Subjective   Subjective Reports stiffness in hip ( right) and fatigue.    Treatment   Gait Entered/exited pool via ramp with handrail assist with increased time and effort required as well as close sba for safety.  Perrformed 5 laps forward walking with noodle support across pool with close supervision and assist for balance as needed. 2 laps of backward walking with pool noodle assist and close supervision.  1 lap sidestepping also at wall with rail for safety.    Exercises Standing exercises to include marching, hip abduction, hip extension, hip abduction with knee and hip at 90 degrees, hip circles x 1 min each.bilaterally. Trying to increase stance and endurance time on right while performing  exercises on left.    Specific Exercises Hip/Low Back   Hip/Low Back Seated exercises to include, laq, abd/add, and bicycles x 2 min each.                     PT Education - 12/04/14 1054    Education provided Yes   Education Details safety, benefits of different exercises.    Person(s) Educated Patient   Methods Explanation   Comprehension Verbalized understanding             PT Long Term Goals - 11/28/14 1200    PT LONG TERM GOAL #1   Title patient will demonstrate decreased fall risk by TUG of 20 seconds or better by 12/05/2014   Baseline Curent TUG 26.5 seconds  (was 30 seconds 10/17/14) (24.2 seconds: 11/28/2014)   Status Partially Met   PT LONG TERM GOAL #2   Title Patient will improve 10 MW score to 13 seconds or less indicating improved community ambulation and decrease fall risk by 12/05/2014   Baseline 16 seconds 10/31/14 = 15 seconds (11/28/2014 13 seconds)    Status Achieved   PT LONG TERM GOAL #3   Title Patient will be able to ambulate without assistive device for short household ambulation and appropriate assistive device outside the home for more independence in the community by 12/05/2014   Baseline Patient is ambulating within one room a few steps without assistive device   Status Partially Met   PT LONG TERM GOAL #4   Title Patient will improve LEFS to 40/64 or better by 12/05/2014 demonstrating improved function with daily tasks involving LE   Baseline 28/64   Status On-going   PT LONG TERM GOAL #5   Title Patient will be independent with home program without verbal cuing and be able to self manage symptoms and exercises by 12/05/2014   Baseline Patient has limited knowledg of appropriate exercises and progression to improve function and self manage symptoms at home   Status On-going               Problem List Patient Active Problem List   Diagnosis Date Noted  . Skin lesion of chest wall 07/17/2014  . Breast cancer ([place])  03/07/2013  . Personal history of malignant neoplasm of large intestine   . Malignant neoplasm of upper-outer quadrant of female breast ([place])   . Cancer ([place]) 12/24/2009  . ANXIETY 06/11/2008  . CONSTIPATION, CHRONIC 06/11/2008  . FIBROMYALGIA 06/11/2008    Yahsir Wickens, PT, MPT, GCS 12/04/2014, 12:42 PM  [facility] [address] [place], [address] Phone: [phone]   Fax:  [phone]  Name: Terri Perez MRN: [id number] Date  of Birth: 08/27/1945

## 2014-12-09 ENCOUNTER — Ambulatory Visit: Payer: Medicare Other | Admitting: Physical Therapy

## 2014-12-09 DIAGNOSIS — R262 Difficulty in walking, not elsewhere classified: Secondary | ICD-10-CM | POA: Diagnosis not present

## 2014-12-09 DIAGNOSIS — M25659 Stiffness of unspecified hip, not elsewhere classified: Secondary | ICD-10-CM | POA: Diagnosis not present

## 2014-12-09 DIAGNOSIS — M6281 Muscle weakness (generalized): Secondary | ICD-10-CM

## 2014-12-09 DIAGNOSIS — M25551 Pain in right hip: Secondary | ICD-10-CM | POA: Diagnosis not present

## 2014-12-09 NOTE — Therapy (Signed)
[facility] [address], [place], [address] Phone: [phone]   Fax:  [phone]  Physical Therapy Treatment  Patient Details  Name: Terri Perez MRN: [id number] Date of Birth: 1945/05/13 No Data Recorded  Encounter Date: 12/09/2014      PT End of Session - 12/09/14 1102    Visit Number 41   Number of Visits 43   Date for PT Re-Evaluation 12/05/14   Authorization Type 41   Authorization Time Period 40   PT Start Time 0955   PT Stop Time 0352   PT Time Calculation (min) 40 min   Activity Tolerance Patient tolerated treatment well;No increased pain;Patient limited by fatigue   Behavior During Therapy [facility] for tasks assessed/performed      Past Medical History  Diagnosis Date  . Personal history of fibromyalgia 2002  . Unspecified constipation   . H/O cystitis 2011  . Special screening for malignant neoplasms, colon   . Arm fracture     right forearm, d/t fall  . Personal history of malignant neoplasm of large intestine   . Bowel trouble 2010  . Cancer [facility]) 12/2009    Left UOQ breast tumor; wide excision,sn bx and whole breast radiation. Chemotherapy done. There was only a 36m area of residual tumor remaining. All sn were negative. This was ER-positive, PR-negative, HER-2 neu not over expressed tumor. The original ultrasound size was 2.6 cm(T2).  . Malignant neoplasm of upper-outer quadrant of female breast ([facility] January 13, 2010    2+ centimeter tumor, neoadjuvant chemotherapy. On wide excision 3 mm area of residual tumor. Sentinel node negative. ER-30%, PR-negative, HER-2 not overexpressing.  . [place]    left, chemo and radiation    Past Surgical History  Procedure Laterality Date  . Colonoscopy  2010    Dr. WAllen Norris normal  . Portacath placement  2011  . Port-a-cath removal  2013  . Breast surgery Left 2011    wide excision  . Breast biopsy Left 2011  . Breast  cyst aspiration Left 1985  . Skin cancer excision  1980    face  . Tubal ligation  1973  . Abdominal hysterectomy  1974  . Back surgery  1985    bone spurs between 5&6, used bone from left hip  . [facility]  . Femur fracture surgery Right March 2016  . Joint replacement Bilateral Jan 2016 and March 2016    hip    There were no vitals filed for this visit.  Visit Diagnosis:  Difficulty walking  Muscle weakness (generalized)  Hip stiffness, unspecified laterality      Subjective Assessment - 12/09/14 1100    Subjective Patient continues to report hip tightness.    Patient is accompained by: Family member   Pertinent History Patient reports she fell in the Fall 2015 and then fractured right hip with THR Fall 2015 and while she was recuperating from this she fell and fractured her femur and underwent right hip revision and rod in right femur 04/04/2014. She was seen by home health physical therapy x 8 weeks and is now referred to out patient physical therapy for continued rehabiliation.    Limitations Sitting;Walking;House hold activities   How long can you sit comfortably? > 1 hour   How long can you stand comfortably? 30 min.    How long can you walk comfortably? 10 min.  Patient Stated Goals Patient would like to be able to move more easily, dress herself and walk with less difficulty.    Currently in Pain? No/denies                     Adult Aquatic Therapy - 12/09/14 1101    Aquatic Therapy Subjective   Subjective Reports stiffness in hip ( right) and fatigue.    Treatment   Gait Entered/exited pool via ramp with handrail assist with increased time and effort required as well as close sba for safety.  Perrformed 5 laps forward walking with noodle support across pool with close supervision and assist for balance as needed. 2 laps of backward walking with pool noodle assist and close supervision.  1 lap sidestepping also at wall with  rail for safety.    Exercises Standing exercises to include marching, hip abduction, hip extension, hip abduction with knee and hip at 90 degrees, hip circles x 1 min each.bilaterally. Trying to increase stance and endurance time on right while performing exercises on left.    Specific Exercises Hip/Low Back   Hip/Low Back Seated exercises to include, laq, abd/add, and bicycles x 2 min each.                     PT Education - 12/09/14 1101    Education provided Yes   Education Details progression of healing   Person(s) Educated Patient   Methods Explanation   Comprehension Verbalized understanding             PT Long Term Goals - 11/28/14 1200    PT LONG TERM GOAL #1   Title patient will demonstrate decreased fall risk by TUG of 20 seconds or better by 12/05/2014   Baseline Curent TUG 26.5 seconds  (was 30 seconds 10/17/14) (24.2 seconds: 11/28/2014)   Status Partially Met   PT LONG TERM GOAL #2   Title Patient will improve 10 MW score to 13 seconds or less indicating improved community ambulation and decrease fall risk by 12/05/2014   Baseline 16 seconds 10/31/14 = 15 seconds (11/28/2014 13 seconds)    Status Achieved   PT LONG TERM GOAL #3   Title Patient will be able to ambulate without assistive device for short household ambulation and appropriate assistive device outside the home for more independence in the community by 12/05/2014   Baseline Patient is ambulating within one room a few steps without assistive device   Status Partially Met   PT LONG TERM GOAL #4   Title Patient will improve LEFS to 40/64 or better by 12/05/2014 demonstrating improved function with daily tasks involving LE   Baseline 28/64   Status On-going   PT LONG TERM GOAL #5   Title Patient will be independent with home program without verbal cuing and be able to self manage symptoms and exercises by 12/05/2014   Baseline Patient has limited knowledg of appropriate exercises and progression  to improve function and self manage symptoms at home   Status On-going               Plan - 12/09/14 1103    Clinical Impression Statement Patient continues to have less tightness/stiffness after aquatic sessions. She has made good progress with therapy and reports she is able to do more at home.    Pt will benefit from skilled therapeutic intervention in order to improve on the following deficits Difficulty walking;Decreased endurance;Pain;Decreased strength;Impaired flexibility   Rehab Potential Good  Clinical Impairments Affecting Rehab Potential (+) motivated, family support   (-) multiple comorbidities: history of CA, colon resection, THR and subsequent fall with femur fracture and THR revision    PT Frequency 3x / week   PT Duration 6 weeks   PT Treatment/Interventions Patient/family education;Therapeutic exercise;Manual techniques;Moist Heat;Cryotherapy;Aquatic Therapy   PT Next Visit Plan aquatic therapy and manual therapy to help with spasms, and pain, therapeutic exercise for strength and endurance   Consulted and Agree with Plan of Care Patient        Problem List Patient Active Problem List   Diagnosis Date Noted  . Skin lesion of chest wall 07/17/2014  . Breast cancer ([place]) 03/07/2013  . Personal history of malignant neoplasm of large intestine   . Malignant neoplasm of upper-outer quadrant of female breast ([place])   . Cancer ([place]) 12/24/2009  . ANXIETY 06/11/2008  . CONSTIPATION, CHRONIC 06/11/2008  . FIBROMYALGIA 06/11/2008    Kseniya Grunden,PT, MPT, GCS 12/09/2014, 11:05 AM  [facility] [address], [place], [address] Phone: [phone]   Fax:  [phone]  Name: Terri Perez MRN: [id number] Date of Birth: Jan 10, 1946

## 2014-12-10 NOTE — Addendum Note (Signed)
Addended by: Aldona Lento on: 12/10/2014 03:56 PM   Modules accepted: Orders

## 2014-12-11 ENCOUNTER — Ambulatory Visit: Payer: Medicare Other | Admitting: Physical Therapy

## 2014-12-11 DIAGNOSIS — R262 Difficulty in walking, not elsewhere classified: Secondary | ICD-10-CM | POA: Diagnosis not present

## 2014-12-11 DIAGNOSIS — M25551 Pain in right hip: Secondary | ICD-10-CM | POA: Diagnosis not present

## 2014-12-11 DIAGNOSIS — M25659 Stiffness of unspecified hip, not elsewhere classified: Secondary | ICD-10-CM

## 2014-12-11 DIAGNOSIS — M6281 Muscle weakness (generalized): Secondary | ICD-10-CM | POA: Diagnosis not present

## 2014-12-11 NOTE — Therapy (Signed)
[place] [facility] [address] [place], [address] Phone: [phone]   Fax:  [phone]  Physical Therapy Treatment  Patient Details  Name: Terri Perez MRN: [id number] Date of Birth: [birth year] No Data Recorded  Encounter Date: 12/11/2014      PT End of Session - 12/11/14 1156    Visit Number 42   Number of Visits 43   Date for PT Re-Evaluation 12/05/14   Authorization Type [id number]   Authorization Time Period 40   PT Start Time 0955   PT Stop Time 1962   PT Time Calculation (min) 40 min   Activity Tolerance Patient tolerated treatment well;No increased pain;Patient limited by fatigue   Behavior During Therapy [facility] for tasks assessed/performed      Past Medical History  Diagnosis Date  . Personal history of fibromyalgia 2002  . Unspecified constipation   . H/O cystitis 2011  . Special screening for malignant neoplasms, colon   . Arm fracture     right forearm, d/t fall  . Personal history of malignant neoplasm of large intestine   . Bowel trouble 2010  . Cancer [facility]) 12/2009    Left UOQ breast tumor; wide excision,sn bx and whole breast radiation. Chemotherapy done. There was only a 30m area of residual tumor remaining. All sn were negative. This was ER-positive, PR-negative, HER-2 neu not over expressed tumor. The original ultrasound size was 2.6 cm(T2).  . Malignant neoplasm of upper-outer quadrant of female breast ([facility] January 13, 2010    2+ centimeter tumor, neoadjuvant chemotherapy. On wide excision 3 mm area of residual tumor. Sentinel node negative. ER-30%, PR-negative, HER-2 not overexpressing.  . Breast cancer ([facility] 12/2009    left, chemo and radiation    Past Surgical History  Procedure Laterality Date  . Colonoscopy  2010    Dr. WAllen Norris normal  . Portacath placement  2011  . Port-a-cath removal  2013  . Breast surgery Left 2011    wide excision  . Breast biopsy Left 2011  . Breast  cyst aspiration Left 1985  . Skin cancer excision  1980    face  . Tubal ligation  1973  . Abdominal hysterectomy  1974  . Back surgery  1985    bone spurs between 5&6, used bone from left hip  . [facility]  . Femur fracture surgery Right March 2016  . Joint replacement Bilateral Jan 2016 and March 2016    hip    There were no vitals filed for this visit.  Visit Diagnosis:  Muscle weakness (generalized)  Difficulty walking  Hip stiffness, unspecified laterality      Subjective Assessment - 12/11/14 1154    Subjective Patient continues to report hip tightness.    Patient is accompained by: Family member   Pertinent History Patient reports she fell in the Fall 2015 and then fractured right hip with THR Fall 2015 and while she was recuperating from this she fell and fractured her femur and underwent right hip revision and rod in right femur 04/04/2014. She was seen by home health physical therapy x 8 weeks and is now referred to out patient physical therapy for continued rehabiliation.    Limitations Sitting;Walking;House hold activities   How long can you sit comfortably? > 1 hour   How long can you stand comfortably? 30 min.    How long can you walk comfortably? 10 min.  Patient Stated Goals Patient would like to be able to move more easily, dress herself and walk with less difficulty.    Currently in Pain? No/denies                     Adult Aquatic Therapy - 12/11/14 1155    Aquatic Therapy Subjective   Subjective Reports stiffness in hip ( right) and fatigue.    Treatment   Gait Entered/exited pool via ramp with handrail assist with increased time and effort required as well as close sba for safety.  Perrformed 5 laps forward walking with noodle support across pool with close supervision and assist for balance as needed. 2 laps forward walking without noodle for assist. 2 laps of backward walking with pool noodle assist and close  supervision.  1 lap sidestepping also at wall with rail for safety.    Exercises Standing exercises to include marching, hip abduction, hip extension, hip abduction with knee and hip at 90 degrees, hip circles x 1 min each.bilaterally. Trying to increase stance and endurance time on right while performing exercises on left.    Specific Exercises Hip/Low Back   Hip/Low Back Seated exercises to include, laq, abd/add, and bicycles x 2 min each.                          PT Long Term Goals - 11/28/14 1200    PT LONG TERM GOAL #1   Title patient will demonstrate decreased fall risk by TUG of 20 seconds or better by 01/16/2015   Baseline Curent TUG 26.5 seconds  (was 30 seconds 10/17/14) (24.2 seconds: 11/28/2014)   Status Revised   PT LONG TERM GOAL #2   Title Patient will improve 10 MW score to 13 seconds or less indicating improved community ambulation and decrease fall risk by 12/05/2014   Baseline 16 seconds 10/31/14 = 15 seconds (11/28/2014 13 seconds)    Status Achieved   PT LONG TERM GOAL #3   Title Patient will be able to ambulate without assistive device for short household ambulation and appropriate assistive device outside the home for more independence in the community by 01/16/2015   Baseline Patient is ambulating within one room a few steps without assistive device   Status Partially Met   PT LONG TERM GOAL #4   Title Patient will improve LEFS to 40/64 or better by 12/05/2014 demonstrating improved function with daily tasks involving LE   Baseline 28/64   Status On-going   PT LONG TERM GOAL #5   Title Patient will be independent with home program without verbal cuing and be able to self manage symptoms and exercises by 01/16/2015   Baseline Patient has limited knowledg of appropriate exercises and progression to improve function and self manage symptoms at home   Status Revised   Additional Long Term Goals   Additional Long Term Goals Yes   PT LONG TERM GOAL #6    Title Patient will improve 10 MW score to 11 seconds or less indicating improved community ambulation and decrease fall risk by 01/16/2015   Baseline 16 seconds 10/31/14 = 15 seconds (11/28/2014 13 seconds)    Status Revised               Plan - 12/11/14 1157    Clinical Impression Statement Patient making slow progress with pool therapy. Now is able to do some walking without noodle support. Tolerating 35 minutes consistently but is fatigued.  Pt will benefit from skilled therapeutic intervention in order to improve on the following deficits Difficulty walking;Decreased endurance;Pain;Decreased strength;Impaired flexibility   Rehab Potential Good   Clinical Impairments Affecting Rehab Potential (+) motivated, family support   (-) multiple comorbidities: history of CA, colon resection, THR and subsequent fall with femur fracture and THR revision    PT Frequency 3x / week   PT Duration 6 weeks   PT Treatment/Interventions Patient/family education;Therapeutic exercise;Manual techniques;Moist Heat;Cryotherapy;Aquatic Therapy   PT Next Visit Plan aquatic therapy and manual therapy to help with spasms, and pain, therapeutic exercise for strength and endurance   Consulted and Agree with Plan of Care Patient        Problem List Patient Active Problem List   Diagnosis Date Noted  . Skin lesion of chest wall 07/17/2014  . Breast cancer ([place]) 03/07/2013  . Personal history of malignant neoplasm of large intestine   . Malignant neoplasm of upper-outer quadrant of female breast ([place])   . Cancer ([place]) 12/24/2009  . ANXIETY 06/11/2008  . CONSTIPATION, CHRONIC 06/11/2008  . FIBROMYALGIA 06/11/2008    Trejon Duford, PT, MPT, GCS 12/11/2014, 12:02 PM  [facility] [address] [place], [address] Phone: [phone]   Fax:  [phone]  Name: Terri Perez MRN: [id number] Date of Birth:  1946/01/24

## 2014-12-12 ENCOUNTER — Ambulatory Visit: Payer: Medicare Other | Admitting: Physical Therapy

## 2014-12-12 ENCOUNTER — Encounter: Payer: Self-pay | Admitting: Physical Therapy

## 2014-12-12 DIAGNOSIS — M25659 Stiffness of unspecified hip, not elsewhere classified: Secondary | ICD-10-CM

## 2014-12-12 DIAGNOSIS — R262 Difficulty in walking, not elsewhere classified: Secondary | ICD-10-CM | POA: Diagnosis not present

## 2014-12-12 DIAGNOSIS — M6281 Muscle weakness (generalized): Secondary | ICD-10-CM | POA: Diagnosis not present

## 2014-12-12 DIAGNOSIS — M25551 Pain in right hip: Secondary | ICD-10-CM

## 2014-12-12 NOTE — Therapy (Signed)
[place] [facility] [address], [place], [address] Phone: [phone]   Fax:  [phone]  Physical Therapy Treatment  Patient Details  Name: Terri Perez MRN: [id number] Date of Birth: [birth year]  Encounter Date: 12/12/2014      PT End of Session - 12/12/14 1233    Visit Number 43   Number of Visits 61   Date for PT Re-Evaluation 01/16/15   Authorization Type 43   Authorization Time Period 50   PT Start Time 1139   PT Stop Time 1230   PT Time Calculation (min) 51 min   Activity Tolerance Patient tolerated treatment well;No increased pain;Patient limited by fatigue   Behavior During Therapy [facility] for tasks assessed/performed      Past Medical History  Diagnosis Date  . Personal history of fibromyalgia 2002  . Unspecified constipation   . H/O cystitis 2011  . Special screening for malignant neoplasms, colon   . Arm fracture     right forearm, d/t fall  . Personal history of malignant neoplasm of large intestine   . Bowel trouble 2010  . [facility]    Left UOQ breast tumor; wide excision,sn bx and whole breast radiation. Chemotherapy done. There was only a 108m area of residual tumor remaining. All sn were negative. This was ER-positive, PR-negative, HER-2 neu not over expressed tumor. The original ultrasound size was 2.6 cm(T2).  . 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 On wide excision 3 mm area of residual tumor. Sentinel node negative. ER-30%, PR-negative, HER-2 not overexpressing.  . [place]    left, chemo and radiation    Past Surgical History  Procedure Laterality Date  . Colonoscopy  2010    Dr. WAllen Norris normal  . Portacath placement  2011  . Port-a-cath removal  2013  . Breast surgery Left 2011    wide excision  . Breast biopsy Left 2011  . Breast cyst aspiration  Left 1985  . Skin cancer excision  1980    face  . Tubal ligation  1973  . Abdominal hysterectomy  1974  . Back surgery  1985    bone spurs between 5&6, used bone from left hip  . [facility]  . Femur fracture surgery Right March 2016  . Joint replacement Bilateral Jan 2016 and March 2016    hip    There were no vitals filed for this visit.  Visit Diagnosis:  Muscle weakness (generalized)  Difficulty walking  Hip stiffness, unspecified laterality  Right hip pain      Subjective Assessment - 12/12/14 1145    Subjective Patient continues to report hip tightness with groin spasms when lying in bed. She conitnues to see improvement with pool exercises.  She reports she is noticing improved balance and now transitioning to not using noodle for balance with walking however she did notice increased spasms in back because of using those muscles to walk. She is now able to perform glute sets better as well.    Limitations Sitting;Walking;House hold activities   Patient Stated Goals Patient would like to be able to move more easily, dress herself and walk with less difficulty.    Currently in Pain? No/denies  just stiff in her groin       Objective;  Gait: ambulating with rollator with more erect  posture and improving cadence, increased forward flexion at hips Palpation: decreased soft tissue elasticity with spasms noted along quadriceps muscle right LE       [facility] Adult PT Treatment/Exercise - 12/12/14 1147    Exercises   Exercises Other Exercises   Other Exercises  sitting on treatment table: AAROM right knee flexion/extension, sit to stand  from high table (~21") 3 x 5 reps with close supervision of therapist for proper weight shift, re assessed 10 MW (13.8 seconds), 5 x sit to stand (11 seconds), TUG (22.9 seconds), 6 min. Walk 150 feet in 1 min. 30 seconds (35 seconds to walk first 100 feet),  Instructed in walking in community at mall to increase  endurance: walk 2 min. And rest 1 min. And increase from there. Instructed in hip flexor stretching in supine position with active stretching.    Manual Therapy   Manual Therapy Soft tissue mobilization   Soft tissue mobilization  STM performed superficial and deep techniques to lateral aspect of thigh and along quadriceps attachment to attachment, patella mobilization performed with patella mobilizer x 2 sets      Patient response to treatment: demonstrated improved soft tissue elasticity and verbalized understanding of home exercises to improve flexibility in anterior hips and to improve community endurance with walking, She demonstrated improved endurance and strength as noted with improved time with TUG from 24.4 to 22.9 seconds and 5x sit to stand from 14 seconds 11/04 to 11 seconds 12/12/14          PT Education - 12/12/14 1232    Education provided Yes   Education Details Instructed in stretching for anterior hip stretching and  progressing walking into community with progressive walk rest routing: ie walk 2-3 min., rest 1 min. and advance to more minutes as able, husband also educated in this   Person(s) Educated Patient;Spouse   Methods Explanation   Comprehension Verbalized understanding             PT Long Term Goals - 11/28/14 1200    PT LONG TERM GOAL #1   Title patient will demonstrate decreased fall risk by TUG of 20 seconds or better by 01/16/2015   Baseline Curent TUG 26.5 seconds  (was 30 seconds 10/17/14) (24.2 seconds: 11/28/2014)   Status Revised   PT LONG TERM GOAL #2   Title Patient will improve 10 MW score to 13 seconds or less indicating improved community ambulation and decrease fall risk by 12/05/2014   Baseline 16 seconds 10/31/14 = 15 seconds (11/28/2014 13 seconds)    Status Achieved   PT LONG TERM GOAL #3   Title Patient will be able to ambulate without assistive device for short household ambulation and appropriate assistive device outside the  home for more independence in the community by 01/16/2015   Baseline Patient is ambulating within one room a few steps without assistive device   Status Partially Met   PT LONG TERM GOAL #4   Title Patient will improve LEFS to 40/64 or better by 12/05/2014 demonstrating improved function with daily tasks involving LE   Baseline 28/64   Status On-going   PT LONG TERM GOAL #5   Title Patient will be independent with home program without verbal cuing and be able to self manage symptoms and exercises by 01/16/2015   Baseline Patient has limited knowledg of appropriate exercises and progression to improve function and self manage symptoms at home   Status Revised   Additional Long Term Goals   Additional  Long Term Goals Yes   PT LONG TERM GOAL #6   Title Patient will improve 10 MW score to 11 seconds or less indicating improved community ambulation and decrease fall risk by 01/16/2015   Baseline 16 seconds 10/31/14 = 15 seconds (11/28/2014 13 seconds)    Status Revised               Plan - 12/12/14 1234    Clinical Impression Statement Patient demonstrates improvement in all areas assessed including TUG, 10MW and 5x sit to stand. she is walking with less flexion at hips and demonstrates improving cadence and endurance. She continues with spasms in right LE/quadriceps and is continuing to benefit from aquatic therapy to improve strength, endurance and ROM.    Pt will benefit from skilled therapeutic intervention in order to improve on the following deficits Difficulty walking;Decreased endurance;Pain;Decreased strength;Impaired flexibility   Rehab Potential Good   PT Frequency 2x / week   PT Duration 6 weeks   PT Treatment/Interventions Patient/family education;Therapeutic exercise;Manual techniques;Moist Heat;Cryotherapy;Aquatic Therapy   PT Next Visit Plan aquatic therapy and manual therapy to help with spasms, and pain, therapeutic exercise for strength and endurance         Problem List Patient Active Problem List   Diagnosis Date Noted  . Skin lesion of chest wall 07/17/2014  . Breast cancer ([place]) 03/07/2013  . Personal history of malignant neoplasm of large intestine   . 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 Cancer ([place]) 12/24/2009  . ANXIETY 06/11/2008  . CONSTIPATION, CHRONIC 06/11/2008  . FIBROMYALGIA 06/11/2008    Jomarie Longs PT 12/13/2014, 4:56 PM  [place] [facility] [address], [place], [address] Phone: [phone]   Fax:  [phone]  Name: AUSTIN PONGRATZ MRN: [id number] Date of Birth: 1945/07/26

## 2014-12-16 ENCOUNTER — Ambulatory Visit: Payer: Medicare Other | Admitting: Physical Therapy

## 2014-12-17 ENCOUNTER — Other Ambulatory Visit: Payer: Self-pay | Admitting: Family Medicine

## 2014-12-23 ENCOUNTER — Ambulatory Visit: Payer: Medicare Other | Admitting: Physical Therapy

## 2014-12-25 ENCOUNTER — Ambulatory Visit: Payer: Medicare Other | Admitting: Physical Therapy

## 2014-12-26 ENCOUNTER — Encounter: Payer: Medicare Other | Admitting: Physical Therapy

## 2015-02-18 ENCOUNTER — Ambulatory Visit (INDEPENDENT_AMBULATORY_CARE_PROVIDER_SITE_OTHER): Payer: Medicare Other | Admitting: Podiatry

## 2015-02-18 ENCOUNTER — Encounter: Payer: Self-pay | Admitting: Podiatry

## 2015-02-18 VITALS — BP 186/110 | HR 108 | Resp 18

## 2015-02-18 DIAGNOSIS — L600 Ingrowing nail: Secondary | ICD-10-CM

## 2015-02-18 MED ORDER — NEOMYCIN-POLYMYXIN-HC 3.5-10000-1 OT SOLN: OTIC | Status: DC

## 2015-02-18 NOTE — Patient Instructions (Signed)

## 2015-02-18 NOTE — Progress Notes (Signed)
   Subjective:    [name], female    DOB: 11/14/1945, 70 y.o.   MRN: [id number]  HPI i have an ingrown on my left big toe and it has been going on for about two days and doesn't hurt unless it is touched    Review of Systems  All other systems reviewed and are negative.      Objective:   Physical Exam : I have reviewed her past history medications allergies surgery social history and review of systems. She presents by signs stable alert and oriented 3 in no apparent distress. Pulses are strongly palpable. Neurologic sensorium is intact. Deep tendon reflexes are intact bilateral and muscle strength is 5 over 5 dorsiflexors plantar flexors and inverters and everters all intrinsic musculature is intact. Orthopedic evaluation of a straight hallux valgus deformity otherwise the foot appears to be rectus and no pain on range of motion all joints distal to the ankle without crepitus. Cutaneous  Evaluation demonstrates supple well-hydrated cutis no erythema edema saline as drainage or odor with exception of erythema and edema around the fibular border of the hallux left.        Assessment & Plan:   ingrown nail hallux left.  Plan: chemical matrixectomy was performed to the fibular border of the hallux left. This is performed after local anesthesia was administered. She tolerated the procedure well with her in 1 week for reevaluation. She was given both oral and written home-going instructions for care and soaking of her toe as well as a prescription for Cortisporin Otic to be applied twice daily

## 2015-02-25 ENCOUNTER — Ambulatory Visit (INDEPENDENT_AMBULATORY_CARE_PROVIDER_SITE_OTHER): Payer: Medicare Other | Admitting: Podiatry

## 2015-02-25 ENCOUNTER — Encounter: Payer: Self-pay | Admitting: Podiatry

## 2015-02-25 DIAGNOSIS — L600 Ingrowing nail: Secondary | ICD-10-CM

## 2015-02-25 NOTE — Patient Instructions (Signed)

## 2015-02-25 NOTE — Progress Notes (Signed)
She presents today for follow-up of her matrixectomy hallux left. She states this seems to be doing very well.  Objective: Vital signs are stable alert and oriented 3. Pulses are palpable. No erythema edema sallows drainage or odor.  Assessment: Well-healing surgical toe fibular border hallux left.  Plan: Continue soaking it is also warm water completely resolved. Notify us with any questions or concerns. She will cover during the daytime and leave open at nighttime.

## 2015-03-30 ENCOUNTER — Ambulatory Visit (INDEPENDENT_AMBULATORY_CARE_PROVIDER_SITE_OTHER): Payer: Medicare Other | Admitting: Family Medicine

## 2015-03-30 ENCOUNTER — Encounter: Payer: Self-pay | Admitting: Family Medicine

## 2015-03-30 VITALS — BP 132/94 | HR 92 | Temp 97.8°F | Resp 16 | Wt 208.0 lb

## 2015-03-30 DIAGNOSIS — C4490 Unspecified malignant neoplasm of skin, unspecified: Secondary | ICD-10-CM | POA: Insufficient documentation

## 2015-03-30 DIAGNOSIS — G43009 Migraine without aura, not intractable, without status migrainosus: Secondary | ICD-10-CM

## 2015-03-30 DIAGNOSIS — G43909 Migraine, unspecified, not intractable, without status migrainosus: Secondary | ICD-10-CM | POA: Insufficient documentation

## 2015-03-30 DIAGNOSIS — M797 Fibromyalgia: Secondary | ICD-10-CM | POA: Diagnosis not present

## 2015-03-30 DIAGNOSIS — R0683 Snoring: Secondary | ICD-10-CM

## 2015-03-30 DIAGNOSIS — F419 Anxiety disorder, unspecified: Secondary | ICD-10-CM | POA: Diagnosis not present

## 2015-03-30 DIAGNOSIS — M810 Age-related osteoporosis without current pathological fracture: Secondary | ICD-10-CM

## 2015-03-30 DIAGNOSIS — K219 Gastro-esophageal reflux disease without esophagitis: Secondary | ICD-10-CM | POA: Insufficient documentation

## 2015-03-30 DIAGNOSIS — G4700 Insomnia, unspecified: Secondary | ICD-10-CM | POA: Insufficient documentation

## 2015-03-30 DIAGNOSIS — F331 Major depressive disorder, recurrent, moderate: Secondary | ICD-10-CM

## 2015-03-30 DIAGNOSIS — F329 Major depressive disorder, single episode, unspecified: Secondary | ICD-10-CM | POA: Insufficient documentation

## 2015-03-30 DIAGNOSIS — R0681 Apnea, not elsewhere classified: Secondary | ICD-10-CM

## 2015-03-30 DIAGNOSIS — K589 Irritable bowel syndrome without diarrhea: Secondary | ICD-10-CM | POA: Insufficient documentation

## 2015-03-30 DIAGNOSIS — M158 Other polyosteoarthritis: Secondary | ICD-10-CM

## 2015-03-30 DIAGNOSIS — M1990 Unspecified osteoarthritis, unspecified site: Secondary | ICD-10-CM | POA: Insufficient documentation

## 2015-03-30 MED ORDER — TRAMADOL HCL 50 MG PO TABS: 50.0000 mg | ORAL_TABLET | ORAL | Status: DC | PRN

## 2015-03-30 MED ORDER — CYCLOBENZAPRINE HCL 10 MG PO TABS: 10.0000 mg | ORAL_TABLET | Freq: Three times a day (TID) | ORAL | Status: DC

## 2015-03-30 MED ORDER — ALPRAZOLAM 0.5 MG PO TABS: 0.5000 mg | ORAL_TABLET | ORAL | Status: DC | PRN

## 2015-03-30 NOTE — Progress Notes (Signed)
[name], female   DOB: 10-Jul-1945, 70 y.o.   MRN: [id number]    Subjective:  HPI  Patient is here for 6 months follow up.  Anxiety: patient takes Xanax about 3 times daily. She is ok with refills till May.  Fibromyalgia: patient takes Tramadol at least twice daily for muscle pain mainly, and takes Flexeril about 1 tablet daily.  Osteoporosis: Patient saw endocrinologist after BMD showed osteoprosis and patient was put on Fosamax but she could not tolerate it after 3 weeks. Had GI symtpoms.  Insomnia: patient states she has hard time sleeping. She wakes up about every 1 hour or 1 1/2 hours. Then she will feel like needing to go to urinate. She is not having urinary issues.  She has not taking insomnia medications before, she does not want to take something that will sedate her.  Prior to Admission medications   Medication Sig Start Date End Date Taking? Authorizing Provider  ALPRAZolam Duanne Moron) 0.5 MG tablet Take 1 tablet (0.5 mg total) by mouth every 4 (four) hours as needed. for pain 12/17/14  Yes Richard Maceo Pro., MD  calcium carbonate 1250 MG capsule Take 1,250 mg by mouth 2 (two) times daily with a meal.   Yes Historical Provider, MD  cyclobenzaprine (FLEXERIL) 10 MG tablet Take 1 tablet (10 mg total) by mouth 3 (three) times daily. 07/30/14  Yes Richard Maceo Pro., MD  traMADol (ULTRAM) 50 MG tablet Take 1 tablet (50 mg total) by mouth every 4 (four) hours as needed. 10/14/14  Yes Richard Maceo Pro., MD    Patient Active Problem List   Diagnosis Date Noted  . Skin lesion of chest wall 07/17/2014  . Closed fracture of shaft of femur ([place]) 04/01/2014  . Breast cancer ([place]) 03/07/2013  . Personal history of malignant neoplasm of large intestine   . 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 Cancer ([place]) 12/24/2009  . ANXIETY 06/11/2008  . CONSTIPATION, CHRONIC 06/11/2008  . FIBROMYALGIA 06/11/2008    Past Medical History    Diagnosis Date  . Personal history of fibromyalgia 2002  . Unspecified constipation   . H/O cystitis 2011  . Special screening for malignant neoplasms, colon   . Arm fracture     right forearm, d/t fall  . Personal history of malignant neoplasm of large intestine   . Bowel trouble 2010  . Cancer [facility]) 12/2009    Left UOQ breast tumor; wide excision,sn bx and whole breast radiation. Chemotherapy done. There was only a 41m area of residual tumor remaining. All sn were negative. This was ER-positive, PR-negative, HER-2 neu not over expressed tumor. The original ultrasound size was 2.6 cm(T2).  . 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 On wide excision 3 mm area of residual tumor. Sentinel node negative. ER-30%, PR-negative, HER-2 not overexpressing.  . Breast cancer ([place] 12/2009    left, chemo and radiation    Social History   Social History  . Marital Status: Married    Spouse Name: N/A  . Number of Children: N/A  . Years of Education: N/A   Occupational History  . Not on file.   Social History Main Topics  . Smoking status: Never Smoker   . Smokeless tobacco: Never Used  . Alcohol Use: No  . Drug Use: No  . Sexual Activity: No   Other Topics Concern  .  Not on file   Social History Narrative    Allergies  Allergen Reactions  . Alendronate     Gi symptoms  . Milk-Related Compounds   . Oxycodone-Acetaminophen   . Vicodin [Hydrocodone-Acetaminophen] Nausea Only  . Wheat Bran   . Tape Rash    Rash from steri strips    Review of Systems  Constitutional: Positive for malaise/fatigue.  Respiratory: Negative.   Cardiovascular: Negative.   Gastrointestinal: Negative.   Genitourinary: Positive for urgency (at night time when she cant sleep).  Musculoskeletal: Positive for myalgias, back pain and joint pain.  Neurological: Positive for weakness.  Psychiatric/Behavioral: The  patient is nervous/anxious and has insomnia.      Objective:  BP 132/94 mmHg  Pulse 92  Temp(Src) 97.8 F (36.6 C)  Resp 16  Wt 208 lb (94.348 kg)  Physical Exam  Constitutional: She is oriented to person, place, and time and well-developed, well-nourished, and in no distress.  HENT:  Head: Normocephalic and atraumatic.  Eyes: Conjunctivae are normal. Pupils are equal, round, and reactive to light.  Neck: Normal range of motion. Neck supple.  Cardiovascular: Normal rate, regular rhythm, normal heart sounds and intact distal pulses.   No murmur heard. Pulmonary/Chest: Effort normal and breath sounds normal. No respiratory distress. She has no wheezes.  Neurological: She is alert and oriented to person, place, and time. She is not agitated and not disoriented. She displays weakness. Gait abnormal.  Psychiatric: Mood, affect and judgment normal.    Lab Results  Component Value Date   WBC 7.8 09/16/2014   HGB 11.5* 05/15/2014   HCT 38.5 09/16/2014   PLT 271 09/16/2014   GLUCOSE 95 09/16/2014   CHOL 229* 09/16/2014   TRIG 76 09/16/2014   HDL 117 09/16/2014   LDLCALC 97 09/16/2014   TSH 0.882 09/16/2014   INR 0.9 02/26/2014   HGBA1C 6.0 02/27/2014    CMP     Component Value Date/Time   NA 141 09/16/2014 1521   NA 137 05/15/2014 1123   NA 140 05/24/2008 1310   K 5.8* 09/16/2014 1521   K 4.3 05/15/2014 1123   CL 101 09/16/2014 1521   CL 103 05/15/2014 1123   CO2 24 09/16/2014 1521   CO2 29 05/15/2014 1123   GLUCOSE 95 09/16/2014 1521   GLUCOSE 91 05/15/2014 1123   GLUCOSE 99 05/24/2008 1310   BUN 26 09/16/2014 1521   BUN 21* 05/15/2014 1123   BUN 12 05/24/2008 1310   CREATININE 1.01* 09/16/2014 1521   CREATININE 0.89 05/15/2014 1123   CALCIUM 10.4* 09/16/2014 1521   CALCIUM 9.8 05/15/2014 1123   PROT 7.0 09/16/2014 1521   PROT 7.3 05/15/2014 1123   ALBUMIN 4.2 09/16/2014 1521   ALBUMIN 3.7 05/15/2014 1123   AST 14 09/16/2014 1521   AST 18 05/15/2014 1123    ALT 17 09/16/2014 1521   ALT 15 05/15/2014 1123   ALKPHOS 127* 09/16/2014 1521   ALKPHOS 163* 05/15/2014 1123   BILITOT 0.2 09/16/2014 1521   BILITOT 0.4 05/15/2014 1123   GFRNONAA 57* 09/16/2014 1521   GFRNONAA >60 05/15/2014 1123   GFRNONAA >60 02/27/2014 0517   GFRAA 66 09/16/2014 1521   GFRAA >60 05/15/2014 1123   GFRAA >60 02/27/2014 0517    Assessment and Plan :  1. Fibromyalgia Refills given. - cyclobenzaprine (FLEXERIL) 10 MG tablet; Take 1 tablet (10 mg total) by mouth 3 (three) times daily.  Dispense: 90 tablet; Refill: 5 - traMADol (ULTRAM) 50 MG tablet; Take  1 tablet (50 mg total) by mouth every 4 (four) hours as needed.  Dispense: 180 tablet; Refill: 5  2. Acute anxiety Refill given. - ALPRAZolam (XANAX) 0.5 MG tablet; Take 1 tablet (0.5 mg total) by mouth every 4 (four) hours as needed. for pain  Dispense: 120 tablet; Refill: 5  3. Insomnia This could be due to sleep apnea. Will set up sleep study. Apnea is witnessed by patient's husband.  4. Snores/likely patient has obstructive sleep apnea. [place] is 22 today.more than 50% of visit is spent in counseling today.  5. Osteoporosis Did not tolerate Fosamax. Patient has been taking calcium and drinking almond milk, trying natural things.   Patient was seen and examined by Dr. Eulas Post and note was scribed by Theressa Millard, RMA.    Miguel Aschoff MD [facility] 03/30/2015 11:19 AM

## 2015-04-07 DIAGNOSIS — Z96641 Presence of right artificial hip joint: Secondary | ICD-10-CM | POA: Diagnosis not present

## 2015-04-07 DIAGNOSIS — Z471 Aftercare following joint replacement surgery: Secondary | ICD-10-CM | POA: Diagnosis not present

## 2015-04-08 ENCOUNTER — Telehealth: Payer: Self-pay | Admitting: Family Medicine

## 2015-04-08 NOTE — Telephone Encounter (Signed)
[name] called to see if there has been any appointment set up for his wife.  Stated it has been a week and he has not heard anything and wanted to make sure they had not forgot to contact them.

## 2015-04-17 ENCOUNTER — Ambulatory Visit: Payer: Medicare Other | Attending: Student | Admitting: Physical Therapy

## 2015-04-17 DIAGNOSIS — R531 Weakness: Secondary | ICD-10-CM

## 2015-04-17 DIAGNOSIS — R262 Difficulty in walking, not elsewhere classified: Secondary | ICD-10-CM

## 2015-04-17 DIAGNOSIS — M25659 Stiffness of unspecified hip, not elsewhere classified: Secondary | ICD-10-CM | POA: Diagnosis not present

## 2015-04-17 DIAGNOSIS — M25551 Pain in right hip: Secondary | ICD-10-CM | POA: Diagnosis not present

## 2015-04-17 NOTE — Therapy (Deleted)
[place] MAIN [facility] SERVICES [address] [place], [place], [address] Phone: [phone]   Fax:  [phone]  Physical Therapy Evaluation  Patient Details  Name: Terri Perez MRN: [id number] Date of Birth: 26-Jun-1945 No Data Recorded  Encounter Date: 04/17/2015      PT End of Session - 04/18/15 2132    Visit Number 1   Number of Visits 12   Date for PT Re-Evaluation 07/09/15   Authorization Type 1   Authorization Time Period 12   PT Start Time 1405   PT Stop Time 1500   PT Time Calculation (min) 55 min   Activity Tolerance No increased pain;Patient tolerated treatment well   Behavior During Therapy [facility] for tasks assessed/performed      Past Medical History  Diagnosis Date  . Personal history of fibromyalgia 2002  . Unspecified constipation   . H/O cystitis 2011  . Special screening for malignant neoplasms, colon   . Arm fracture     right forearm, d/t fall  . Personal history of malignant neoplasm of large intestine   . Bowel trouble 2010  . Cancer [facility]) 12/2009    Left UOQ breast tumor; wide excision,sn bx and whole breast radiation. Chemotherapy done. There was only a 28m area of residual tumor remaining. All sn were negative. This was ER-positive, PR-negative, HER-2 neu not over expressed tumor. The original ultrasound size was 2.6 cm(T2).  . Malignant neoplasm of upper-outer quadrant of female breast ([facility] January 13, 2010    2+ centimeter tumor, neoadjuvant chemotherapy. On wide excision 3 mm area of residual tumor. Sentinel node negative. ER-30%, PR-negative, HER-2 not overexpressing.  . [place] [place]    left, chemo and radiation    Past Surgical History  Procedure Laterality Date  . Colonoscopy  2010    Dr. WAllen Norris normal  . Portacath placement  2011  . Port-a-cath removal  2013  . Breast surgery Left 2011    wide excision  . Breast biopsy Left 2011  . Breast cyst aspiration Left 1985  . Skin  cancer excision  1980    face  . Tubal ligation  1973  . Abdominal hysterectomy  1974  . Back surgery  1985    bone spurs between 5&6, used bone from left hip  . [facility]  . Femur fracture surgery Right March 2016  . Joint replacement Bilateral Jan 2016 and March 2016    hip  . Cervical discectomy    . Upper gi endoscopy  08/20/07    gastritis without hemorrhage    There were no vitals filed for this visit.  Visit Diagnosis:  Difficulty walking  Hip stiffness, unspecified laterality  Right hip pain  Weakness generalized      Subjective Assessment - 04/18/15 2042    Subjective  1) Pt reports tightness and achiness in both thighs R > L. Pt reports her groin is tight as she sits in the WC.  Pt thinks her tightness may be related to to the abdominal surgery due to insufficeincy of bowels 2015. Pt takes Miralax for bowel momvents, 3x/day , Stool Type 5-7.  Denied fecal and urinary incontinence.  2) poor sleep: pt's husband reports she gets up every hour. Pt sleeps in a lift chair.      Patient is accompained by: Family member  husband   Limitations Sitting;Walking;House hold activities   Patient Stated Goals Patient would like  to be able to move more easily, dress herself and walk with less difficulty.             [facility] PT Assessment - 04/18/15 2043    Assessment   Medical Diagnosis s/p hip revision   Prior Function   Level of Independence Needs assistance with ADLs;Needs assistance with transfers   Observation/Other Assessments   Other Surveys  --  LEFS 9/80   Observation/Other Assessments-Edema    Edema --  noted 3+ pitting edema bilaterally   Coordination   Gross Motor Movements are Fluid and Coordinated --  abdominal /pelvic floor straining w/ cue for BM   Squat   Comments able to perform minisquats with rolling walker (static) 5 reps    Sit to Stand   Comments limited knee flexion due to swelling in her legs, required assistance  from her husband, used momentum    ROM / Strength   AROM / PROM / Strength --  hip ext, knee flexion limited severely by swelling in BLE   Transfers   Comments husband required in bathroom to assist with her t/f off toilet   Ambulation/Gait   Assistive device Rolling walker   Gait velocity 0.5 m/s                [facility] Adult PT Treatment/Exercise - 04/18/15 2043    Therapeutic Activites    Therapeutic Activities --  standing m ini squats 5 reps, 5 x day   Other Therapeutic Activities referral to lymphedema specialist to decrease swelling and regain ROM. Postpone aquatic therapy and explained reason, pt was interested in aquatic therapy because she found it helpful in the past. PT explained the importance of addressing her mobility first   Neuro Re-ed    Neuro Re-ed Details  breathing technique to relax pelvic floor                     Problem List Patient Active Problem List   Diagnosis Date Noted  . Episode of syncope 04/24/2015  . History of operative procedure on hip 04/24/2015  . Restless leg 04/24/2015  . Osteoporosis 03/30/2015  . Insomnia 03/30/2015  . Skin cancer 03/30/2015  . MDD (major depressive disorder) ([place]) 03/30/2015  . Migraine 03/30/2015  . GERD (gastroesophageal reflux disease) 03/30/2015  . IBS (irritable bowel syndrome) 03/30/2015  . OA (osteoarthritis) 03/30/2015  . Skin lesion of chest wall 07/17/2014  . Closed fracture of shaft of femur ([place]) 04/01/2014  . Breast cancer ([place]) 03/07/2013  . Personal history of malignant neoplasm of large intestine   . Malignant neoplasm of upper-outer quadrant of female breast ([place])   . Cancer ([place]) 12/24/2009  . Acute anxiety 06/11/2008  . CONSTIPATION, CHRONIC 06/11/2008  . Fibromyalgia 06/11/2008    Jerl Mina ,PT, DPT, E-RYT  05/04/2015, 10:20 PM  [place] MAIN [facility] SERVICES [address] [place], [place], [address] Phone: [phone]    Fax:  [phone]  Name: Terri Perez MRN: [id number] Date of Birth: 1945/02/03

## 2015-04-21 ENCOUNTER — Ambulatory Visit: Payer: Medicare Other | Admitting: Physical Therapy

## 2015-04-22 ENCOUNTER — Ambulatory Visit: Payer: Medicare Other | Attending: Otolaryngology

## 2015-04-22 ENCOUNTER — Ambulatory Visit: Payer: Medicare Other | Admitting: Occupational Therapy

## 2015-04-22 DIAGNOSIS — R0683 Snoring: Secondary | ICD-10-CM | POA: Diagnosis not present

## 2015-04-22 DIAGNOSIS — G4733 Obstructive sleep apnea (adult) (pediatric): Secondary | ICD-10-CM | POA: Insufficient documentation

## 2015-04-23 ENCOUNTER — Ambulatory Visit: Payer: Medicare Other | Admitting: Physical Therapy

## 2015-04-23 ENCOUNTER — Other Ambulatory Visit: Payer: Self-pay | Admitting: *Deleted

## 2015-04-23 ENCOUNTER — Telehealth: Payer: Self-pay

## 2015-04-23 DIAGNOSIS — C50412 Malignant neoplasm of upper-outer quadrant of left female breast: Secondary | ICD-10-CM

## 2015-04-23 NOTE — Telephone Encounter (Signed)
Patient's husband is calling saying that patient has had increased swelling in both legs that has gotten worse over the last week. He is wanting to know if you could call something in for patient or can she be seen today? Please advise. Contact number is correct. [facility] [address]. Thanks!

## 2015-04-23 NOTE — Telephone Encounter (Signed)
Scheduled patient an appt to be seen by [facility] in the morning.

## 2015-04-24 ENCOUNTER — Ambulatory Visit (INDEPENDENT_AMBULATORY_CARE_PROVIDER_SITE_OTHER): Payer: Medicare Other | Admitting: Physician Assistant

## 2015-04-24 ENCOUNTER — Encounter: Payer: Self-pay | Admitting: Physician Assistant

## 2015-04-24 VITALS — BP 160/80 | HR 110 | Temp 98.5°F | Resp 16

## 2015-04-24 DIAGNOSIS — M2550 Pain in unspecified joint: Secondary | ICD-10-CM | POA: Diagnosis not present

## 2015-04-24 DIAGNOSIS — M05752 Rheumatoid arthritis with rheumatoid factor of left hip without organ or systems involvement: Secondary | ICD-10-CM

## 2015-04-24 DIAGNOSIS — R55 Syncope and collapse: Secondary | ICD-10-CM | POA: Insufficient documentation

## 2015-04-24 DIAGNOSIS — G2581 Restless legs syndrome: Secondary | ICD-10-CM | POA: Insufficient documentation

## 2015-04-24 DIAGNOSIS — R600 Localized edema: Secondary | ICD-10-CM | POA: Diagnosis not present

## 2015-04-24 DIAGNOSIS — Z9889 Other specified postprocedural states: Secondary | ICD-10-CM | POA: Insufficient documentation

## 2015-04-24 DIAGNOSIS — M05751 Rheumatoid arthritis with rheumatoid factor of right hip without organ or systems involvement: Secondary | ICD-10-CM | POA: Diagnosis not present

## 2015-04-24 NOTE — Patient Instructions (Signed)
Lymphedema  Lymphedema is swelling that is caused by the abnormal collection of lymph under the skin. Lymph is fluid from the tissues in your body that travels in the lymphatic system. This system is part of the immune system and includes lymph nodes and lymph vessels. The lymph vessels collect and carry the excess fluid, fats, proteins, and wastes from the tissues of the body to the bloodstream. This system also works to clean and remove bacteria and waste products from the body.  Lymphedema occurs when the lymphatic system is blocked. When the lymph vessels or lymph nodes are blocked or damaged, lymph does not drain properly, causing an abnormal buildup of lymph. This leads to swelling in the arms or legs. Lymphedema cannot be cured by medicines, but various methods can be used to help reduce the swelling.  CAUSES  There are two types of lymphedema. Primary lymphedema is caused by the absence or abnormality of the lymph vessel at birth. Secondary lymphedema is more common. It occurs when the lymph vessel is damaged or blocked. Common causes of lymph vessel blockage include:  · Skin infection, such as cellulitis.  · Infection by parasites (filariasis).  · Injury.  · Cancer.  · Radiation therapy.  · Formation of scar tissue.  · Surgery.  SYMPTOMS  Symptoms of this condition include:  · Swelling of the arm or leg.  · A heavy or tight feeling in the arm or leg.  · Swelling of the feet, toes, or fingers. Shoes or rings may fit more tightly than before.  · Redness of the skin over the affected area.  · Limited movement of the affected limb.  · Sensitivity to touch or discomfort in the affected limb.  DIAGNOSIS  This condition may be diagnosed with:  · A physical exam.  · Medical history.  · Imaging tests, such as:    Lymphoscintigraphy. In this test, a low dose of a radioactive substance is injected to trace the flow of lymph through the lymph vessels.    MRI.    CT scan.    Duplex ultrasound. This test uses sound waves  to produce images of the vessels and the blood flow on a screen.    Lymphangiography. In this test, a contrast dye is injected into the lymph vessel to help show blockages.  TREATMENT  Treatment for this condition may depend on the cause. Treatment may include:  · Exercise. Certain exercises can help fluid move out of the affected limb.  · Massage. Gentle massage of the affected limb can help move the fluid out of the area.  · Compression. Various methods may be used to apply pressure to the affected limb in order to reduce the swelling.    Wearing compression stockings or sleeves on the affected limb.    Bandaging the affected limb.    Using an external pump that is attached to a sleeve that alternates between applying pressure and releasing pressure.  · Surgery. This is usually only done for severe cases. For example, surgery may be done if you have trouble moving the limb or if the swelling does not get better with other treatments.  If an underlying condition is causing the lymphedema, treatment for that condition is needed. For example, antibiotic medicines may be used to treat an infection.  HOME CARE INSTRUCTIONS  Activities  · Exercise regularly as directed by your health care provider.  · Do not sit with your legs crossed.  · When possible, keep the affected limb   raised (elevated) above the level of your heart.  · Avoid carrying things with an arm that is affected by lymphedema.  · Remember that the affected area is more likely to become injured or infected.  · Take these steps to help prevent infection:    Keep the affected area clean and dry.    Protect your skin from cuts. For example, you should use gloves while cooking or gardening. Do not walk barefoot. If you shave the affected area, use an electric razor.  General Instructions  · Take medicines only as directed by your health care provider.  · Eat a healthy diet that includes a lot of fruits and vegetables.  · Do not wear tight clothes, shoes, or  jewelry.  · Do not use heating pads over the affected area.  · Avoid having blood pressure checked on the affected limb.  · Keep all follow-up visits as directed by your health care provider. This is important.  SEEK MEDICAL CARE IF:  · You continue to have swelling in your limb.  · You have a fever.  · You have a cut that does not heal.  · You have redness or pain in the affected area.  · You have new swelling in your limb that comes on suddenly.  · You develop purplish spots or sores (lesions) on your limb.  SEEK IMMEDIATE MEDICAL CARE IF:  · You have a skin rash.  · You have chills or sweats.  · You have shortness of breath.     This information is not intended to replace advice given to you by your health care provider. Make sure you discuss any questions you have with your health care provider.     Document Released: 11/07/2006 Document Revised: 05/27/2014 Document Reviewed: 12/18/2013  [employer]vier Interactive Patient Education ©2016 [employer]

## 2015-04-24 NOTE — Progress Notes (Signed)
Patient: Terri Perez Female    DOB: 1945-08-01   70 y.o.   MRN: [id number] Visit Date: 04/24/2015  Today's Provider: Mar Daring, PA-C   Chief Complaint  Patient presents with  . Leg Swelling   Subjective:    HPI  Anthea Udovich is here today with c/o leg swelling in bilateral lower extremities. She reports that lately her hip and knees have been getting swollen with pain. The pain quality is 8/10. On her knees she feels tingling, numbness and chills bilaterally. No chest pain, fever, headache, or dizziness.She does have pertinent medical history of fibromyalgia and osteoporosis. She has had a total hip replacement with a rod in her right femur as well. She states that she had had swelling initially after her surgery but that this improved with physical therapy. She is not been doing physical therapy formally since November but has been doing home exercises. She states that her ankle has stayed swollen on the right but that the pain that she is now having with the swelling in both legs is new over the last week. She reports the pain she feels mostly in her knees and hips. She states it is different than her fibromyalgia pain since it is involving the joints more so than her muscles.     Allergies  Allergen Reactions  . Alendronate     Gi symptoms  . Milk-Related Compounds   . Nsaids     GI upset.  . Oxycodone-Acetaminophen   . Vicodin [Hydrocodone-Acetaminophen] Nausea Only  . Wheat Bran   . Tape Rash    Rash from steri strips   Previous Medications   ALPRAZOLAM (XANAX) 0.5 MG TABLET    Take 1 tablet (0.5 mg total) by mouth every 4 (four) hours as needed. for pain   ASPIRIN-ACETAMINOPHEN-CAFFEINE (EXCEDRIN MIGRAINE) 250-250-65 MG TABLET    Take by mouth.   CALCIUM CARBONATE 1250 MG CAPSULE    Take 1,250 mg by mouth 2 (two) times daily with a meal. Reported on 04/24/2015   CYCLOBENZAPRINE (FLEXERIL) 10 MG TABLET    Take 1 tablet (10 mg total) by mouth 3 (three)  times daily.   OMEPRAZOLE (PRILOSEC) 20 MG CAPSULE    Take by mouth.   TRAMADOL (ULTRAM) 50 MG TABLET    Take 1 tablet (50 mg total) by mouth every 4 (four) hours as needed.    Review of Systems  Constitutional: Negative for fever and fatigue.  HENT: Negative.   Respiratory: Negative.   Cardiovascular: Positive for leg swelling. Negative for chest pain and palpitations.  Musculoskeletal: Positive for myalgias, joint swelling, arthralgias and gait problem. Negative for back pain, neck pain and neck stiffness.    Social History  Substance Use Topics  . Smoking status: Never Smoker   . Smokeless tobacco: Never Used  . Alcohol Use: No   Objective:   BP 160/80 mmHg  Pulse 110  Temp(Src) 98.5 F (36.9 C) (Oral)  Resp 16  Wt   Physical Exam  Constitutional: She appears well-developed and well-nourished. No distress.  Neck: Normal range of motion. Neck supple. No JVD present. No tracheal deviation present. No thyromegaly present.  Cardiovascular: Normal rate, regular rhythm and normal heart sounds.  Exam reveals no gallop and no friction rub.   No murmur heard. Pulmonary/Chest: Effort normal and breath sounds normal. No respiratory distress. She has no wheezes. She has no rales.  Musculoskeletal:       Right hip: She exhibits decreased  range of motion, decreased strength, tenderness and swelling. She exhibits no bony tenderness.       Left hip: She exhibits decreased range of motion, decreased strength and tenderness. She exhibits no bony tenderness.       Right knee: She exhibits swelling (per patient). She exhibits normal range of motion, no effusion, no deformity, no erythema and no bony tenderness. Tenderness found.       Left knee: She exhibits swelling (per patient). She exhibits normal range of motion, no effusion and no bony tenderness. Tenderness found.       Right ankle: She exhibits swelling. She exhibits normal range of motion.       Left ankle: She exhibits swelling. She  exhibits normal range of motion.  Patient is in a wheelchair today due to pain and weakness with ambulation  Lymphadenopathy:    She has no cervical adenopathy.  Skin: She is not diaphoretic.  Vitals reviewed.       Assessment & Plan:     1. Joint pain Worsening joint pain and swelling. I will check labs as below and follow-up pending these lab results. If her kidney function is stable may consider adding a fluid pill for short time to see if there is any benefit in her lower extremity edema with this. She also does have tramadol for pain but she has only been taking it once or twice a day. I did advise her to increase taking tramadol as it is prescribed for the time being to see if this helps relieve her pain. She and her husband both voiced understanding. I do feel that her swelling is going to be most likely secondary to lymphedema. I did discuss lymphedema with them as well as gave them a brochure about lymphedema. Her husband states that he thinks that the physical therapy she is going for next week has to do with lymphedema as well as pelvic floor therapies. I advised them to call if her symptoms do not improve or worsen over the next week or so. - CBC with Differential - Comprehensive Metabolic Panel (CMET) - Sed Rate (ESR) - C-reactive protein - CK (Creatine Kinase) - Rheumatoid Factor - ANA w/Reflex if Positive  2. Bilateral edema of lower extremity See above medical treatment plan. - CBC with Differential - Comprehensive Metabolic Panel (CMET) - Sed Rate (ESR) - C-reactive protein - CK (Creatine Kinase) - Rheumatoid Factor - ANA w/Reflex if Positive       Mar Daring, PA-C  [facility]

## 2015-04-25 LAB — COMPREHENSIVE METABOLIC PANEL
A/G RATIO: 1.7 (ref 1.2–2.2)
ALBUMIN: 4.3 g/dL (ref 3.5–4.8)
ALK PHOS: 89 IU/L (ref 39–117)
ALT: 14 IU/L (ref 0–32)
AST: 13 IU/L (ref 0–40)
BUN / CREAT RATIO: 20 (ref 11–26)
BUN: 21 mg/dL (ref 8–27)
CHLORIDE: 101 mmol/L (ref 96–106)
CO2: 24 mmol/L (ref 18–29)
Calcium: 9.8 mg/dL (ref 8.7–10.3)
Creatinine, Ser: 1.03 mg/dL — ABNORMAL HIGH (ref 0.57–1.00)
GFR calc non Af Amer: 55 mL/min/{1.73_m2} — ABNORMAL LOW (ref >59)
GFR, EST AFRICAN AMERICAN: 64 mL/min/{1.73_m2} (ref >59)
GLOBULIN, TOTAL: 2.6 g/dL (ref 1.5–4.5)
Glucose: 98 mg/dL (ref 65–99)
POTASSIUM: 4.9 mmol/L (ref 3.5–5.2)
SODIUM: 142 mmol/L (ref 134–144)
TOTAL PROTEIN: 6.9 g/dL (ref 6.0–8.5)

## 2015-04-25 LAB — CBC WITH DIFFERENTIAL/PLATELET
BASOS: 0 %
Basophils Absolute: 0 10*3/uL (ref 0.0–0.2)
EOS (ABSOLUTE): 0.3 10*3/uL (ref 0.0–0.4)
Eos: 4 %
HEMOGLOBIN: 12.5 g/dL (ref 11.1–15.9)
Hematocrit: 38.3 % (ref 34.0–46.6)
IMMATURE GRANS (ABS): 0 10*3/uL (ref 0.0–0.1)
Immature Granulocytes: 0 %
LYMPHS ABS: 1.9 10*3/uL (ref 0.7–3.1)
LYMPHS: 31 %
MCH: 29.8 pg (ref 26.6–33.0)
MCHC: 32.6 g/dL (ref 31.5–35.7)
MCV: 91 fL (ref 79–97)
MONOCYTES: 9 %
Monocytes Absolute: 0.5 10*3/uL (ref 0.1–0.9)
NEUTROS ABS: 3.4 10*3/uL (ref 1.4–7.0)
Neutrophils: 56 %
Platelets: 277 10*3/uL (ref 150–379)
RBC: 4.19 x10E6/uL (ref 3.77–5.28)
RDW: 14.7 % (ref 12.3–15.4)
WBC: 6.1 10*3/uL (ref 3.4–10.8)

## 2015-04-25 LAB — SEDIMENTATION RATE: SED RATE: 16 mm/h (ref 0–40)

## 2015-04-25 LAB — ANA W/REFLEX IF POSITIVE: Anti Nuclear Antibody(ANA): NEGATIVE

## 2015-04-25 LAB — C-REACTIVE PROTEIN: CRP: 6.2 mg/L — ABNORMAL HIGH (ref 0.0–4.9)

## 2015-04-25 LAB — CK: Total CK: 65 U/L (ref 24–173)

## 2015-04-25 LAB — RHEUMATOID FACTOR: RHEUMATOID FACTOR: 24 [IU]/mL — AB (ref 0.0–13.9)

## 2015-04-27 ENCOUNTER — Telehealth: Payer: Self-pay

## 2015-04-27 NOTE — Telephone Encounter (Signed)
Patient's husband Jori Moll advised as directed below. Jori Moll states patient has a appointment scheduled with rheumatologist.

## 2015-04-27 NOTE — Addendum Note (Signed)
Addended by: Mar Daring on: 04/27/2015 09:43 AM   Modules accepted: Orders

## 2015-04-27 NOTE — Telephone Encounter (Signed)
-----   Message from Mar Daring, [place] sent at 04/27/2015  9:42 AM EDT ----- All labs were fairly stable but your rheumatoid factor was elevated indicating possible rheumatoid arthritis.  I did discuss with Dr. Rosanna Randy and he recommends referral for further eval and possible treatment.

## 2015-04-28 ENCOUNTER — Ambulatory Visit: Payer: Medicare Other | Admitting: Physical Therapy

## 2015-04-28 ENCOUNTER — Encounter: Payer: Self-pay | Admitting: *Deleted

## 2015-04-29 ENCOUNTER — Ambulatory Visit: Payer: Medicare Other | Attending: Student | Admitting: Occupational Therapy

## 2015-04-29 ENCOUNTER — Encounter: Payer: Self-pay | Admitting: Occupational Therapy

## 2015-04-29 DIAGNOSIS — I890 Lymphedema, not elsewhere classified: Secondary | ICD-10-CM

## 2015-04-29 NOTE — Patient Instructions (Signed)

## 2015-04-30 ENCOUNTER — Ambulatory Visit: Payer: Medicare Other | Admitting: Physical Therapy

## 2015-04-30 ENCOUNTER — Ambulatory Visit: Payer: Medicare Other | Admitting: Occupational Therapy

## 2015-04-30 DIAGNOSIS — I890 Lymphedema, not elsewhere classified: Secondary | ICD-10-CM

## 2015-04-30 NOTE — Therapy (Signed)
[place] [facility] SERVICES [address] [place], [place], [address] Phone: [phone]   Fax:  [phone]  Occupational Therapy Treatment  Patient Details  Name: Terri Perez MRN: [id number] Date of Birth: [birth year] No Data Recorded  Encounter Date: 04/30/2015      OT End of Session - 04/30/15 1301    Visit Number 2   Number of Visits 36   Date for OT Re-Evaluation 07/28/15   OT Start Time 1102   OT Stop Time 1200   OT Time Calculation (min) 58 min   Activity Tolerance Patient limited by pain;Patient tolerated treatment well;No increased pain  Pt unable to participate in 1 hr eval due to painful legs and sitting intolerance.    Behavior During Therapy Flat affect      Past Medical History  Diagnosis Date  . Personal history of fibromyalgia 2002  . Unspecified constipation   . H/O cystitis 2011  . Special screening for malignant neoplasms, colon   . Arm fracture     right forearm, d/t fall  . Personal history of malignant neoplasm of large intestine   . Bowel trouble 2010  . Cancer [facility]) 12/2009    Left UOQ breast tumor; wide excision,sn bx and whole breast radiation. Chemotherapy done. There was only a 61m area of residual tumor remaining. All sn were negative. This was ER-positive, PR-negative, HER-2 neu not over expressed tumor. The original ultrasound size was 2.6 cm(T2).  . Malignant neoplasm of upper-outer quadrant of female breast ([facility] January 13, 2010    2+ centimeter tumor, neoadjuvant chemotherapy. On wide excision 3 mm area of residual tumor. Sentinel node negative. ER-30%, PR-negative, HER-2 not overexpressing.  . [place]    left, chemo and radiation    Past Surgical History  Procedure Laterality Date  . Colonoscopy  2010    Dr. WAllen Norris normal  . Portacath placement  2011  . Port-a-cath removal  2013  . Breast surgery Left 2011    wide excision  . Breast biopsy Left 2011  . Breast  cyst aspiration Left 1985  . Skin cancer excision  1980    face  . Tubal ligation  1973  . Abdominal hysterectomy  1974  . Back surgery  1985    bone spurs between 5&6, used bone from left hip  . [facility]  . Femur fracture surgery Right March 2016  . Joint replacement Bilateral Jan 2016 and March 2016    hip  . Cervical discectomy    . Upper gi endoscopy  08/20/07    gastritis without hemorrhage    There were no vitals filed for this visit.  Visit Diagnosis:  Lymphedema, not elsewhere classified      Subjective Assessment - 04/30/15 1251    Subjective  Pt presents for OT visit 2 to address BLE lymphedema. Pt is accompanied by her spouse throughout session. Pt asks for comptression wraps to be applied after MLD today.   Patient is accompained by: Family member   Pertinent History Hx fibromyalgia, Hx malignant large intestine neoplasm, colon resection 2014, abdominal hysterectomy 1974, cervical discectomy, 01/2014 B hip replacemet, 03/2014 R femur fx     Limitations difficulty walking, difficulty w/ all transfers and functional mobility, needs assistance with all basic and instrumental ADLs requiring standing, walking, extended sitting, lifting legs, Pain limiting participation in social and cvommunity activities, ADL performance, and functional  performance of  productive and leisure activities.   Repetition Increases Symptoms   Patient Stated Goals be able to move better, do more for myself, relieve pain   Pain Score --  not rated   Pain Location Leg   Pain Orientation Right;Left   Pain Descriptors / Indicators Aching;Guarding;Tender;Throbbing;Tightness;Tiring;Pressure;Discomfort;Dull;Pins and needles   Pain Type Acute pain   Pain Onset 1 to 4 weeks ago            [facility] OT Assessment - 04/30/15 0001    Assessment   Diagnosis Mild, stage I, BLE lymphedema, R>L   Onset Date 04/13/15   Prior Therapy no prior LE rx, no compression garments    Precautions   Precautions Other (comment)  LUE/LUQ lymphedema precautions 2/2 BCRL   Restrictions   Weight Bearing Restrictions No   Balance Screen   Has the patient fallen in the past 6 months No   Has the patient had a decrease in activity level because of a fear of falling?  --  Hx falls resulting in arm and femure fx > 6 mos   Home  Environment   Lives With Spouse   Prior Function   Level of Independence Needs assistance with transfers;Needs assistance with gait;Needs assistance with homemaking;Needs assistance with ADLs   Vocation Retired   Cognition   Memory Impaired   Behaviors Restless;Poor frustration tolerance         LYMPHEDEMA/ONCOLOGY QUESTIONNAIRE - 04/30/15 1051    Surgeries   Mastectomy Date 11/29/09   What other symptoms do you have   Are you Having Heaviness or Tightness Yes   Are you having Pain Yes   Are you having pitting edema Yes   Body Site BLE, R>L   Is it Hard or Difficult finding clothes that fit Yes   Do you have infections Yes   Stemmer Sign Yes   Lymphedema Stage   Stage STAGE 1 SPONTANEOUSLY REVERSIBLE   Lymphedema Assessments   Lymphedema Assessments Lower extremities   Right Lower Extremity Lymphedema   Other TBA   Left Lower Extremity Lymphedema   Other TBA                 OT Treatments/Exercises (OP) - 04/30/15 0001    ADLs   ADL Education Given Yes   Manual Therapy   Manual Therapy Edema management                OT Education - 04/30/15 1253    Education provided Yes   Education Details Continued skilled Pt/caregiver Education  And LE ADL training throughout visit for lymphedema self care, including compression wrapping, compression garment and device wear/care, lymphatic pumping ther ex, simple self-MLD, and skin care. Discussed progress towards goals.   Person(s) Educated Patient;Spouse   Methods Explanation;Demonstration;Tactile cues;Verbal cues;Handout   Comprehension Verbalized understanding;Need further  instruction             OT Long Term Goals - 04/30/15 1237    OT LONG TERM GOAL #1   Title Pt able to apply thigh-length, multi layered, gradient compression wraps with maximum caregiver assistance within 2 weeks to achieve optimal limb volume reduction.   Baseline dependent   Time 2   Period Weeks   Status New   OT LONG TERM GOAL #2   Title Pt to achieve at least 10% BLE limb volume reductions during Intensive CDT to limit LE progression, decrease infection risk and fall risk, to reduce pain/ discomfort, and to improve safe ambulation and functional mobility.  Baseline dependent    Time 12   Period Weeks   Status New   OT LONG TERM GOAL #3   Title Pt >/= 85 % compliant with all daily, LE self-care protocols, including simple self-manual lymphatic drainage (MLD), skin care, lymphatic pumping the ex, skin care, and donning/ doffing compression wraps and garments with needed level of caregiver assistance to limit LE progression and further functional decline.     Baseline dependent    Time 12   Period Weeks   Status New   OT LONG TERM GOAL #4   Title During Management Phase CDT Pt to sustain limb volume reductions achieved during Intensive Phase CDT within 5% utilizing LE self-care protocols, appropriate compression garments/ devices, and needed level of caregiver assistance.   Baseline dependent    Time 6   Period Months   Status New   OT LONG TERM GOAL #5   Baseline dependent    Status New               Plan - May 15, 2015 1302    Clinical Impression Statement Pt tolerated RLE/ RLQ MLD without digfficulty today. Wraps applied in greadient configuration from foot to below knees. Pt reported no increased pain with MLD and improved comfort in lower leg with compression.   Pt will benefit from skilled therapeutic intervention in order to improve on the following deficits (Retired) Abnormal gait;Decreased cognition;Decreased knowledge of use of DME;Decreased skin  integrity;Increased edema;Impaired flexibility;Decreased mobility;Decreased activity tolerance;Decreased endurance;Decreased range of motion;Decreased strength;Decreased balance;Decreased knowledge of precautions;Difficulty walking;Impaired perceived functional ability;Pain   Rehab Potential Good   OT Frequency 3x / week   OT Duration 12 weeks   OT Treatment/Interventions Self-care/ADL training;DME and/or AE instruction;Manual lymph drainage;Patient/family education;Compression bandaging;Therapeutic exercises;Therapeutic activities;Manual Therapy   Consulted and Agree with Plan of Care Patient;Family member/caregiver          G-Codes - 05-15-2015 1240    Functional Limitation Self care   Self Care Current Status [phone]) At least 80 percent but less than 100 percent impaired, limited or restricted   Self Care Goal Status ([id number]) At least 20 percent but less than 40 percent impaired, limited or restricted      Problem List Patient Active Problem List   Diagnosis Date Noted  . Episode of syncope 04/24/2015  . History of operative procedure on hip 04/24/2015  . Restless leg 04/24/2015  . Osteoporosis 03/30/2015  . Insomnia 03/30/2015  . Skin cancer 03/30/2015  . MDD (major depressive disorder) ([place]) 03/30/2015  . Migraine 03/30/2015  . GERD (gastroesophageal reflux disease) 03/30/2015  . IBS (irritable bowel syndrome) 03/30/2015  . OA (osteoarthritis) 03/30/2015  . Skin lesion of chest wall 07/17/2014  . Closed fracture of shaft of femur ([place]) 04/01/2014  . Breast cancer ([place]) 03/07/2013  . Personal history of malignant neoplasm of large intestine   . Malignant neoplasm of upper-outer quadrant of female breast ([place])   . Cancer ([place]) 12/24/2009  . Acute anxiety 06/11/2008  . CONSTIPATION, CHRONIC 06/11/2008  . Fibromyalgia 06/11/2008    Andrey Spearman, MS, OTR/L, CLT-LANA 05/15/15 1:05 PM   [place] MAIN [facility] SERVICES [address]  [place], [place], [address] Phone: [phone]   Fax:  [phone]  Name: TEMARI SCHOOLER MRN: [id number] Date of Birth: Oct 08, 1945

## 2015-04-30 NOTE — Patient Instructions (Signed)
LE instructions and precautions as established- see initial eval.   

## 2015-04-30 NOTE — Therapy (Signed)
[place] MAIN [facility] SERVICES [address] [place], [place], [address] Phone: [phone]   Fax:  [phone]  Occupational Therapy Evaluation  Patient Details  Name: Terri Perez MRN: [id number] Date of Birth: 1945-04-28 No Data Recorded  Encounter Date: 04/29/2015      OT End of Session - 04/29/15 1234    Visit Number 1   Number of Visits 36   Date for OT Re-Evaluation 07/28/15   OT Start Time 1012   OT Stop Time 1047   OT Time Calculation (min) 35 min   Activity Tolerance Patient limited by pain;Other (comment)  Pt unable to participate in 1 hr eval due to painful legs and sitting intolerance.    Behavior During Therapy Restless;Anxious;Flat affect      Past Medical History  Diagnosis Date  . Personal history of fibromyalgia 2002  . Unspecified constipation   . H/O cystitis 2011  . Special screening for malignant neoplasms, colon   . Arm fracture     right forearm, d/t fall  . Personal history of malignant neoplasm of large intestine   . Bowel trouble 2010  . Cancer [facility]) 12/2009    Left UOQ breast tumor; wide excision,sn bx and whole breast radiation. Chemotherapy done. There was only a 16m area of residual tumor remaining. All sn were negative. This was ER-positive, PR-negative, HER-2 neu not over expressed tumor. The original ultrasound size was 2.6 cm(T2).  . 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 On wide excision 3 mm area of residual tumor. Sentinel node negative. ER-30%, PR-negative, HER-2 not overexpressing.  . Breast cancer ([place] 12/2009    left, chemo and radiation    Past Surgical History  Procedure Laterality Date  . Colonoscopy  2010    Dr. WAllen Norris normal  . Portacath placement  2011  . Port-a-cath removal  2013  . Breast surgery Left 2011    wide excision  . Breast biopsy Left 2011  . Breast cyst aspiration  Left 1985  . Skin cancer excision  1980    face  . Tubal ligation  1973  . Abdominal hysterectomy  1974  . Back surgery  1985    bone spurs between 5&6, used bone from left hip  . [facility]  . Femur fracture surgery Right March 2016  . Joint replacement Bilateral Jan 2016 and March 2016    hip  . Cervical discectomy    . Upper gi endoscopy  08/20/07    gastritis without hemorrhage    There were no vitals filed for this visit.  Visit Diagnosis:  Lymphedema, not elsewhere classified      Subjective Assessment - 04/29/15 1236    Subjective  Pt is referred for OT evaluation and treatment of BLE limb swelling by SJerl Mina PT, DPT. E-RYT and Maxwel WJoya Gaskins MD. Pt reports fluctuating, but unresolving BLE swelling, R>L, and severe joint pain in hips and knees with onset about 2 weeks ago. without known precipitating event.    Patient is accompained by: Family member   Pertinent History Hx fibromyalgia, Hx malignant large intestine neoplasm, colon resection 2014, abdominal hysterectomy 1974, cervical discectomy, 01/2014 B hip replacemet, 03/2014 R femur fx     Limitations difficulty walking, difficulty w/ all transfers and functional mobility, needs assistance with all basic and instrumental ADLs requiring standing, walking, extended sitting,  lifting legs, Pain limiting participation in social and cvommunity activities, ADL performance, and functional  performance of productive and leisure activities.   Repetition Increases Symptoms   Patient Stated Goals be able to move better, do more for myself, relieve pain   Currently in Pain? Yes   Pain Score 8    Pain Location Leg   Pain Orientation Right;Left   Pain Descriptors / Indicators Dull;Grimacing;Guarding;Heaviness;Penetrating;Pins and needles;Pressure;Sore;Squeezing;Tightness;Tingling   Pain Type Acute pain   Pain Onset 1 to 4 weeks ago   Aggravating Factors  standing, walking, sitting, transfers, bed  mobility   Pain Relieving Factors supine positioning in bed           [facility] OT Assessment - 04/30/15 0001    Assessment   Diagnosis Mild, stage I, BLE lymphedema, R>L   Onset Date 04/13/15   Prior Therapy no prior LE rx, no compression garments   Precautions   Precautions Other (comment)  LUE/LUQ lymphedema precautions 2/2 BCRL   Restrictions   Weight Bearing Restrictions No   Balance Screen   Has the patient fallen in the past 6 months No   Has the patient had a decrease in activity level because of a fear of falling?  --  Hx falls resulting in arm and femure fx > 6 mos   Home  Environment   Lives With Spouse   Prior Function   Level of Independence Needs assistance with transfers;Needs assistance with gait;Needs assistance with homemaking;Needs assistance with ADLs   Vocation Retired   Cognition   Memory Impaired   Behaviors Restless;Poor frustration tolerance          LYMPHEDEMA/ONCOLOGY QUESTIONNAIRE - 04/30/15 1051    Surgeries   Mastectomy Date 11/29/09   What other symptoms do you have   Are you Having Heaviness or Tightness Yes   Are you having Pain Yes   Are you having pitting edema Yes   Body Site BLE, R>L   Is it Hard or Difficult finding clothes that fit Yes   Do you have infections Yes   Stemmer Sign Yes   Lymphedema Stage   Stage STAGE 1 SPONTANEOUSLY REVERSIBLE   Lymphedema Assessments   Lymphedema Assessments Lower extremities   Right Lower Extremity Lymphedema   Other TBA   Left Lower Extremity Lymphedema   Other TBA                OT Treatments/Exercises (OP) - 04/30/15 0001    ADLs   ADL Education Given Yes   Manual Therapy   Manual Therapy Edema management               OT Education - 04/29/15 1232    Education provided Yes   Education Details typical Pt and caregiver skilled edu abbreviated today 2/2 pain. Pt unable to tolerate sitting for more than 10 minutes at a time throughout eval: Provided Pt/caregiver skilled  education and ADL training throughout visit for lymphedema etiology, progression, and treatment including Intensive and Management Phase Complete Decongestive Therapy (CDT)  Discussed lymphedema precautions, cellulitis risk, and all CDT and LE self-care components, including compression wrapping/ garments & devices, lymphatic pumping ther ex, simple self-MLD, and skin care. Provided printed Lymphedema Workbook for reference.   Person(s) Educated Patient;Spouse   Methods Explanation;Handout   Comprehension Need further instruction             OT Long Term Goals - 04/30/15 1237    OT LONG TERM GOAL #1   Title Pt able  to apply thigh-length, multi layered, gradient compression wraps with maximum caregiver assistance within 2 weeks to achieve optimal limb volume reduction.   Baseline dependent   Time 2   Period Weeks   Status New   OT LONG TERM GOAL #2   Title Pt to achieve at least 10% BLE limb volume reductions during Intensive CDT to limit LE progression, decrease infection risk and fall risk, to reduce pain/ discomfort, and to improve safe ambulation and functional mobility.   Baseline dependent    Time 12   Period Weeks   Status New   OT LONG TERM GOAL #3   Title Pt >/= 85 % compliant with all daily, LE self-care protocols, including simple self-manual lymphatic drainage (MLD), skin care, lymphatic pumping the ex, skin care, and donning/ doffing compression wraps and garments with needed level of caregiver assistance to limit LE progression and further functional decline.     Baseline dependent    Time 12   Period Weeks   Status New   OT LONG TERM GOAL #4   Title During Management Phase CDT Pt to sustain limb volume reductions achieved during Intensive Phase CDT within 5% utilizing LE self-care protocols, appropriate compression garments/ devices, and needed level of caregiver assistance.   Baseline dependent    Time 6   Period Months   Status New   OT LONG TERM GOAL #5    Baseline dependent    Status New               Plan - 2015-05-25 1054    Clinical Impression Statement Pt will benefit from [place] for ntensive Phase Complete Decongestive Therapy to reduce leg swelling, limit falls and infection risk, and to decreased pain for return to premorbid level of function. Without skilled OT, chronic lymphedema is expected to progress and further functional decline is expected.   Pt will benefit from skilled therapeutic intervention in order to improve on the following deficits (Retired) Abnormal gait;Decreased cognition;Decreased knowledge of use of DME;Decreased skin integrity;Increased edema;Impaired flexibility;Decreased mobility;Decreased activity tolerance;Decreased endurance;Decreased range of motion;Decreased strength;Decreased balance;Decreased knowledge of precautions;Difficulty walking;Impaired perceived functional ability;Pain   Rehab Potential Good   OT Frequency 3x / week   OT Duration 12 weeks   OT Treatment/Interventions Self-care/ADL training;DME and/or AE instruction;Manual lymph drainage;Patient/family education;Compression bandaging;Therapeutic exercises;Therapeutic activities;Manual Therapy   Plan MLD, skin care, ther ex for lymphatic pumping, compression wraps- emphasis on RLE   Consulted and Agree with Plan of Care Patient;Family member/caregiver          G-Codes - May 25, 2015 1240    Functional Limitation Self care   Self Care Current Status [phone]) At least 80 percent but less than 100 percent impaired, limited or restricted   Self Care Goal Status ([id number]) At least 20 percent but less than 40 percent impaired, limited or restricted      Problem List Patient Active Problem List   Diagnosis Date Noted  . Episode of syncope 04/24/2015  . History of operative procedure on hip 04/24/2015  . Restless leg 04/24/2015  . Osteoporosis 03/30/2015  . Insomnia 03/30/2015  . Skin cancer 03/30/2015  . MDD (major depressive  disorder) ([place]) 03/30/2015  . Migraine 03/30/2015  . GERD (gastroesophageal reflux disease) 03/30/2015  . IBS (irritable bowel syndrome) 03/30/2015  . OA (osteoarthritis) 03/30/2015  . Skin lesion of chest wall 07/17/2014  . Closed fracture of shaft of femur ([place]) 04/01/2014  . Breast cancer ([place]) 03/07/2013  . Personal history of malignant neoplasm of  large intestine   . 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 Cancer ([place]) 12/24/2009  . Acute anxiety 06/11/2008  . CONSTIPATION, CHRONIC 06/11/2008  . Fibromyalgia 06/11/2008    Andrey Spearman, MS, OTR/L, [facility] 04/30/2015 12:47 PM   [place] MAIN [facility] SERVICES [address] [place], [place], [address] Phone: [phone]   Fax:  [phone]  Name: Terri Perez MRN: [id number] Date of Birth: Dec 01, 1945

## 2015-05-01 ENCOUNTER — Ambulatory Visit: Payer: Medicare Other | Admitting: Occupational Therapy

## 2015-05-01 DIAGNOSIS — I890 Lymphedema, not elsewhere classified: Secondary | ICD-10-CM | POA: Diagnosis not present

## 2015-05-01 NOTE — Therapy (Signed)
[place] MAIN [facility] SERVICES [address] [place], [place], [address] Phone: [phone]   Fax:  [phone]  Occupational Therapy Treatment  Patient Details  Name: Terri Perez MRN: [id number] Date of Birth: 1945-07-30 No Data Recorded  Encounter Date: 05/01/2015      OT End of Session - 05/01/15 1220    Visit Number 3   Number of Visits 36   Date for OT Re-Evaluation 07/28/15   OT Start Time 1105   OT Stop Time 1829   OT Time Calculation (min) 69 min   Activity Tolerance Patient limited by pain;Patient tolerated treatment well;No increased pain  Pt unable to participate in 1 hr eval due to painful legs and sitting intolerance.    Behavior During Therapy Flat affect      Past Medical History  Diagnosis Date  . Personal history of fibromyalgia 2002  . Unspecified constipation   . H/O cystitis 2011  . Special screening for malignant neoplasms, colon   . Arm fracture     right forearm, d/t fall  . Personal history of malignant neoplasm of large intestine   . Bowel trouble 2010  . Cancer [facility]) 12/2009    Left UOQ breast tumor; wide excision,sn bx and whole breast radiation. Chemotherapy done. There was only a 31m area of residual tumor remaining. All sn were negative. This was ER-positive, PR-negative, HER-2 neu not over expressed tumor. The original ultrasound size was 2.6 cm(T2).  . Malignant neoplasm of upper-outer quadrant of female breast ([facility] January 13, 2010    2+ centimeter tumor, neoadjuvant chemotherapy. On wide excision 3 mm area of residual tumor. Sentinel node negative. ER-30%, PR-negative, HER-2 not overexpressing.  . [place]    left, chemo and radiation    Past Surgical History  Procedure Laterality Date  . Colonoscopy  2010    Dr. WAllen Norris normal  . Portacath placement  2011  . Port-a-cath removal  2013  . Breast surgery Left 2011    wide excision  . Breast biopsy Left 2011  . Breast  cyst aspiration Left 1985  . Skin cancer excision  1980    face  . Tubal ligation  1973  . Abdominal hysterectomy  1974  . Back surgery  1985    bone spurs between 5&6, used bone from left hip  . [facility]  . Femur fracture surgery Right March 2016  . Joint replacement Bilateral Jan 2016 and March 2016    hip  . Cervical discectomy    . Upper gi endoscopy  08/20/07    gastritis without hemorrhage    There were no vitals filed for this visit.      Subjective Assessment - 05/01/15 1146    Subjective  Pt presents for OT visit 3 to address BLE lymphedema. Pt is accompanied by her spouse throughout session. Pt asks for comptression wraps to be applied to groin. Pt reports no problems tolerating compression wraps to knee over visit interval.   Patient is accompained by: Family member   Pertinent History Hx fibromyalgia, Hx malignant large intestine neoplasm, colon resection 2014, abdominal hysterectomy 1974, cervical discectomy, 01/2014 B hip replacemet, 03/2014 R femur fx     Limitations difficulty walking, difficulty w/ all transfers and functional mobility, needs assistance with all basic and instrumental ADLs requiring standing, walking, extended sitting, lifting legs, Pain limiting participation in social and cvommunity activities, ADL performance, and functional  performance of productive and leisure activities.   Repetition Increases Symptoms   Patient Stated Goals be able to move better, do more for myself, relieve pain   Currently in Pain? Yes   Pain Location Leg   Pain Orientation Right;Left   Pain Onset 1 to 4 weeks ago             LYMPHEDEMA/ONCOLOGY QUESTIONNAIRE - 04/30/15 1051    Surgeries   Mastectomy Date 11/29/09   What other symptoms do you have   Are you Having Heaviness or Tightness Yes   Are you having Pain Yes   Are you having pitting edema Yes   Body Site BLE, R>L   Is it Hard or Difficult finding clothes that fit Yes   Do  you have infections Yes   Stemmer Sign Yes   Lymphedema Stage   Stage STAGE 1 SPONTANEOUSLY REVERSIBLE   Lymphedema Assessments   Lymphedema Assessments Lower extremities   Right Lower Extremity Lymphedema   Other TBA   Left Lower Extremity Lymphedema   Other TBA                 OT Treatments/Exercises (OP) - 05/01/15 0001    ADLs   ADL Education Given Yes   Manual Therapy   Manual Therapy Edema management;Manual Lymphatic Drainage (MLD);Compression Bandaging;Other (comment)  skin care w/ low ph castor oil throughout MLD   Manual therapy comments excellent tolerance- analgesic effect   Edema Management spouse reluctant to learn simple self MLD   Manual Lymphatic Drainage (MLD) Manual lymph drainage (MLD) to LLE in supine utilizing functional inguinal lymph nodes and deep abdominal lymphatics as is customary for non-cancer related lower extremity LE, including bilateral "short neck" sequence, J strokes to sub and supraclavicular LN, deep abdominal pathways, functional inguinal LN, lower extremity proximal to distal w/ emphasis on medial knee bottleneck and politeal LN. Performed fibrosis technique to B maleoli and distal  legs to address fatty fibrosis. Good tolerance.   Compression Bandaging LLE gradient compression wraps applied circumferentially in gradient configuration from toes to groin as follows: toe wrap x1 under cotton stockinett; 8 cm x 1 to foot and ankle, 10 cm x 2, then 12 cm x 2- all layered over .04 x 10 cm and 12 cm Rosidol Soft foam from A-G.   Other Manual Therapy skin care                OT Education - 05/01/15 1150    Education provided Yes   Education Details  lEmphasis of LE ADL training today on compression wrapping, ymphatic pumping ther ex, and intro level simple self MLD   Person(s) Educated Patient;Spouse             OT Long Term Goals - 04/30/15 1237    OT LONG TERM GOAL #1   Title Pt able to apply thigh-length, multi layered,  gradient compression wraps with maximum caregiver assistance within 2 weeks to achieve optimal limb volume reduction.   Baseline dependent   Time 2   Period Weeks   Status New   OT LONG TERM GOAL #2   Title Pt to achieve at least 10% BLE limb volume reductions during Intensive CDT to limit LE progression, decrease infection risk and fall risk, to reduce pain/ discomfort, and to improve safe ambulation and functional mobility.   Baseline dependent    Time 12   Period Weeks   Status New   OT LONG TERM GOAL #3   Title  Pt >/= 85 % compliant with all daily, LE self-care protocols, including simple self-manual lymphatic drainage (MLD), skin care, lymphatic pumping the ex, skin care, and donning/ doffing compression wraps and garments with needed level of caregiver assistance to limit LE progression and further functional decline.     Baseline dependent    Time 12   Period Weeks   Status New   OT LONG TERM GOAL #4   Title During Management Phase CDT Pt to sustain limb volume reductions achieved during Intensive Phase CDT within 5% utilizing LE self-care protocols, appropriate compression garments/ devices, and needed level of caregiver assistance.   Baseline dependent    Time 6   Period Months   Status New   OT LONG TERM GOAL #5   Baseline dependent    Status New               Plan - 05/01/15 1221    Clinical Impression Statement Pt tolerated knee high gradient compression wrap without difficulty. Pt reports burning in her foot and leg has been absent while standing in wraps. RLE limb volume has reduced considerably below knee vy visiual assessment. Pt able to perform therex by end of session with Max A., including vissual demo and verbal coeing,.   Rehab Potential Good   OT Frequency 3x / week   OT Duration 12 weeks   OT Treatment/Interventions Self-care/ADL training;DME and/or AE instruction;Manual lymph drainage;Patient/family education;Compression bandaging;Therapeutic  exercises;Therapeutic activities;Manual Therapy   Consulted and Agree with Plan of Care Patient;Family member/caregiver      Patient will benefit from skilled therapeutic intervention in order to improve the following deficits and impairments:  Abnormal gait, Decreased cognition, Decreased knowledge of use of DME, Decreased skin integrity, Increased edema, Impaired flexibility, Decreased mobility, Decreased activity tolerance, Decreased endurance, Decreased range of motion, Decreased strength, Decreased balance, Decreased knowledge of precautions, Difficulty walking, Impaired perceived functional ability, Pain  Visit Diagnosis: Lymphedema, not elsewhere classified      G-Codes - 05-04-2015 1240    Functional Limitation Self care   Self Care Current Status ([id number]) At least 80 percent but less than 100 percent impaired, limited or restricted   Self Care Goal Status ([id number]) At least 20 percent but less than 40 percent impaired, limited or restricted      Problem List Patient Active Problem List   Diagnosis Date Noted  . Episode of syncope 04/24/2015  . History of operative procedure on hip 04/24/2015  . Restless leg 04/24/2015  . Osteoporosis 03/30/2015  . Insomnia 03/30/2015  . Skin cancer 03/30/2015  . MDD (major depressive disorder) ([place]) 03/30/2015  . Migraine 03/30/2015  . GERD (gastroesophageal reflux disease) 03/30/2015  . IBS (irritable bowel syndrome) 03/30/2015  . OA (osteoarthritis) 03/30/2015  . Skin lesion of chest wall 07/17/2014  . Closed fracture of shaft of femur ([place]) 04/01/2014  . Breast cancer ([place]) 03/07/2013  . Personal history of malignant neoplasm of large intestine   . 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 Cancer ([place]) 12/24/2009  . Acute anxiety 06/11/2008  . CONSTIPATION, CHRONIC 06/11/2008  . Fibromyalgia 06/11/2008    Andrey Spearman, MS, OTR/L, [facility] 05/01/2015 12:24 PM   [place] MAIN [facility] SERVICES [address] [place], [place], [address] Phone: [phone]   Fax:  [phone]  Name: Terri Perez MRN: [id number] Date of Birth: 1945-02-05

## 2015-05-04 ENCOUNTER — Ambulatory Visit: Payer: Medicare Other | Admitting: Occupational Therapy

## 2015-05-04 ENCOUNTER — Ambulatory Visit: Payer: Medicare Other | Admitting: Physical Therapy

## 2015-05-04 DIAGNOSIS — I890 Lymphedema, not elsewhere classified: Secondary | ICD-10-CM | POA: Diagnosis not present

## 2015-05-04 NOTE — Therapy (Signed)
[place] MAIN [facility] SERVICES [address] [place], [place], [address] Phone: [phone]   Fax:  [phone]  Occupational Therapy Treatment  Patient Details  Name: Terri Perez MRN: [id number] Date of Birth: 02-11-1945 No Data Recorded  Encounter Date: 05/04/2015      OT End of Session - 05/04/15 1721    Visit Number 4   Number of Visits 36   Date for OT Re-Evaluation 07/28/15   OT Start Time 0305   OT Stop Time 0434   OT Time Calculation (min) 89 min   Activity Tolerance Patient limited by pain;Patient tolerated treatment well;No increased pain  Pt unable to participate in 1 hr eval due to painful legs and sitting intolerance.    Behavior During Therapy Flat affect      Past Medical History  Diagnosis Date  . Personal history of fibromyalgia 2002  . Unspecified constipation   . H/O cystitis 2011  . Special screening for malignant neoplasms, colon   . Arm fracture     right forearm, d/t fall  . Personal history of malignant neoplasm of large intestine   . Bowel trouble 2010  . Cancer [facility]) 12/2009    Left UOQ breast tumor; wide excision,sn bx and whole breast radiation. Chemotherapy done. There was only a 25m area of residual tumor remaining. All sn were negative. This was ER-positive, PR-negative, HER-2 neu not over expressed tumor. The original ultrasound size was 2.6 cm(T2).  . Malignant neoplasm of upper-outer quadrant of female breast ([facility] January 13, 2010    2+ centimeter tumor, neoadjuvant chemotherapy. On wide excision 3 mm area of residual tumor. Sentinel node negative. ER-30%, PR-negative, HER-2 not overexpressing.  . [place]    left, chemo and radiation    Past Surgical History  Procedure Laterality Date  . Colonoscopy  2010    Dr. WAllen Norris normal  . Portacath placement  2011  . Port-a-cath removal  2013  . Breast surgery Left 2011    wide excision  . Breast biopsy Left 2011  . Breast  cyst aspiration Left 1985  . Skin cancer excision  1980    face  . Tubal ligation  1973  . Abdominal hysterectomy  1974  . Back surgery  1985    bone spurs between 5&6, used bone from left hip  . [facility]  . Femur fracture surgery Right March 2016  . Joint replacement Bilateral Jan 2016 and March 2016    hip  . Cervical discectomy    . Upper gi endoscopy  08/20/07    gastritis without hemorrhage    There were no vitals filed for this visit.      Subjective Assessment - 05/04/15 1521    Subjective  Pt presents for OT visit 4 to address BLE lymphedema. Pt is accompanied by her spouse throughout session.    Patient is accompained by: Family member   Pertinent History Hx fibromyalgia, Hx malignant large intestine neoplasm, colon resection 2014, abdominal hysterectomy 1974, cervical discectomy, 01/2014 B hip replacemet, 03/2014 R femur fx     Limitations difficulty walking, difficulty w/ all transfers and functional mobility, needs assistance with all basic and instrumental ADLs requiring standing, walking, extended sitting, lifting legs, Pain limiting participation in social and cvommunity activities, ADL performance, and functional  performance of productive and leisure activities.   Repetition Increases Symptoms   Patient Stated Goals be able to move  better, do more for myself, relieve pain   Currently in Pain? Yes   Pain Score --  not rated   Pain Location Leg   Pain Orientation Right;Left   Pain Type Chronic pain   Pain Onset 1 to 4 weeks ago             LYMPHEDEMA/ONCOLOGY QUESTIONNAIRE - 05/04/15 1716    Right Lower Extremity Lymphedema   Other RLE below the knee (A-D) limb volume =  4672.41 cm.                   OT Treatments/Exercises (OP) - 05/04/15 0001    ADLs   ADL Education Given Yes   Manual Therapy   Manual Therapy Edema management;Compression Bandaging   Manual therapy comments BLE Comparative limb volumetrics    Compression Bandaging LLE gradient compression wraps applied circumferentially in gradient configuration from toes to groin as follows: toe wrap x1 under cotton stockinett; 8 cm x 1 to foot and ankle, 10 cm x 2, then 12 cm x 2- all layered over .04 x 10 cm and 12 cm Rosidol Soft foam from A-G.                OT Education - 05/04/15 1720    Education provided Yes   Education Details Continued skilled Pt/caregiver Education  And LE ADL training throughout visit for lymphedema self care, including compression wrapping, compression garment and device wear/care, lymphatic pumping ther ex, simple self-MLD, and skin care. Discussed progress towards goals.   Person(s) Educated Patient;Spouse   Methods Explanation;Demonstration   Comprehension Verbalized understanding;Need further instruction             OT Long Term Goals - 04/30/15 1237    OT LONG TERM GOAL #1   Title Pt able to apply thigh-length, multi layered, gradient compression wraps with maximum caregiver assistance within 2 weeks to achieve optimal limb volume reduction.   Baseline dependent   Time 2   Period Weeks   Status New   OT LONG TERM GOAL #2   Title Pt to achieve at least 10% BLE limb volume reductions during Intensive CDT to limit LE progression, decrease infection risk and fall risk, to reduce pain/ discomfort, and to improve safe ambulation and functional mobility.   Baseline dependent    Time 12   Period Weeks   Status New   OT LONG TERM GOAL #3   Title Pt >/= 85 % compliant with all daily, LE self-care protocols, including simple self-manual lymphatic drainage (MLD), skin care, lymphatic pumping the ex, skin care, and donning/ doffing compression wraps and garments with needed level of caregiver assistance to limit LE progression and further functional decline.     Baseline dependent    Time 12   Period Weeks   Status New   OT LONG TERM GOAL #4   Title During Management Phase CDT Pt to sustain limb volume  reductions achieved during Intensive Phase CDT within 5% utilizing LE self-care protocols, appropriate compression garments/ devices, and needed level of caregiver assistance.   Baseline dependent    Time 6   Period Months   Status New   OT LONG TERM GOAL #5   Baseline dependent    Status New               Plan - 05/04/15 1721    Clinical Impression Statement Pt reports pain is decreased from 8/10 on first visit to 4/10 after wrapping leg with short  stretch gradient wraps for ~ 1 week. Limb volume in the painful RLE is obviously decreased significantly by visual assessment, despite we did not take initial baseline volumetrics prior to commencing w/ compression wrapping 2/2 pain and activity intolerance. Volumetrics reveal  mild limb volume differential below the knee , and moderate 19% LVD for full leg w/ L>R at present for both landmarks.     Rehab Potential Good   OT Frequency 3x / week   OT Duration 12 weeks   OT Treatment/Interventions Self-care/ADL training;DME and/or AE instruction;Manual lymph drainage;Patient/family education;Compression bandaging;Therapeutic exercises;Therapeutic activities;Manual Therapy   Consulted and Agree with Plan of Care Patient;Family member/caregiver      Patient will benefit from skilled therapeutic intervention in order to improve the following deficits and impairments:  Abnormal gait, Decreased cognition, Decreased knowledge of use of DME, Decreased skin integrity, Increased edema, Impaired flexibility, Decreased mobility, Decreased activity tolerance, Decreased endurance, Decreased range of motion, Decreased strength, Decreased balance, Decreased knowledge of precautions, Difficulty walking, Impaired perceived functional ability, Pain  Visit Diagnosis: Lymphedema, not elsewhere classified    Problem List Patient Active Problem List   Diagnosis Date Noted  . Episode of syncope 04/24/2015  . History of operative procedure on hip 04/24/2015   . Restless leg 04/24/2015  . Osteoporosis 03/30/2015  . Insomnia 03/30/2015  . Skin cancer 03/30/2015  . MDD (major depressive disorder) ([place]) 03/30/2015  . Migraine 03/30/2015  . GERD (gastroesophageal reflux disease) 03/30/2015  . IBS (irritable bowel syndrome) 03/30/2015  . OA (osteoarthritis) 03/30/2015  . Skin lesion of chest wall 07/17/2014  . Closed fracture of shaft of femur ([place]) 04/01/2014  . Breast cancer ([place]) 03/07/2013  . Personal history of malignant neoplasm of large intestine   . 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 Cancer ([place]) 12/24/2009  . Acute anxiety 06/11/2008  . CONSTIPATION, CHRONIC 06/11/2008  . Fibromyalgia 06/11/2008    Andrey Spearman, MS, OTR/L, [facility] 05/04/2015 5:28 PM  [place] MAIN [facility] SERVICES [address] [place], [place], [address] Phone: [phone]   Fax:  [phone]  Name: Terri Perez MRN: [id number] Date of Birth: February 16, 1945

## 2015-05-04 NOTE — Patient Instructions (Signed)
LE instructions and precautions as established- see initial eval.   

## 2015-05-04 NOTE — Therapy (Addendum)
[place] MAIN [facility] SERVICES [address] [place], [place], [address] Phone: [phone]   Fax:  [phone]  Physical Therapy Evaluation  Patient Details  Name: Terri Perez MRN: [id number] Date of Birth: [birth year] No Data Recorded  Encounter Date: 04/17/2015      PT End of Session - 04/18/15 2132    Visit Number 1   Number of Visits 12   Date for PT Re-Evaluation 07/09/15   Authorization Type 1   Authorization Time Period 12   PT Start Time 1405   PT Stop Time 1500   PT Time Calculation (min) 55 min   Activity Tolerance No increased pain;Patient tolerated treatment well   Behavior During Therapy [facility] for tasks assessed/performed      Past Medical History  Diagnosis Date  . Personal history of fibromyalgia 2002  . Unspecified constipation   . H/O cystitis 2011  . Special screening for malignant neoplasms, colon   . Arm fracture     right forearm, d/t fall  . Personal history of malignant neoplasm of large intestine   . Bowel trouble 2010  . Cancer [facility]) 12/2009    Left UOQ breast tumor; wide excision,sn bx and whole breast radiation. Chemotherapy done. There was only a 98m area of residual tumor remaining. All sn were negative. This was ER-positive, PR-negative, HER-2 neu not over expressed tumor. The original ultrasound size was 2.6 cm(T2).  . Malignant neoplasm of upper-outer quadrant of female breast ([facility] January 13, 2010    2+ centimeter tumor, neoadjuvant chemotherapy. On wide excision 3 mm area of residual tumor. Sentinel node negative. ER-30%, PR-negative, HER-2 not overexpressing.  . [place]    left, chemo and radiation    Past Surgical History  Procedure Laterality Date  . Colonoscopy  2010    Dr. WAllen Norris normal  . Portacath placement  2011  . Port-a-cath removal  2013  . Breast surgery Left 2011    wide excision  . Breast biopsy Left 2011  . Breast cyst aspiration Left 1985  . Skin  cancer excision  1980    face  . Tubal ligation  1973  . Abdominal hysterectomy  1974  . Back surgery  1985    bone spurs between 5&6, used bone from left hip  . [facility]  . Femur fracture surgery Right March 2016  . Joint replacement Bilateral Jan 2016 and March 2016    hip  . Cervical discectomy    . Upper gi endoscopy  08/20/07    gastritis without hemorrhage    There were no vitals filed for this visit.       Subjective Assessment - 04/18/15 2042    Subjective  1) Pt reports tightness and achiness in both thighs R > L. Pt reports her groin is tight as she sits in the WC.  Pt thinks her tightness may be related to to the abdominal surgery due to a colon resection surgery 2015. Pt takes Miralax for bowel movements, 3x/day , Stool Type 5-7.  Denied fecal and urinary incontinence.  2) poor sleep: pt's husband reports she gets up every hour. Pt sleeps in a lift chair.      Patient is accompained by: Family member  husband   Limitations Sitting;Walking;House hold activities   Patient Stated Goals Patient would like to be able to move more easily, dress herself and walk with less difficulty.  [facility] PT Assessment - 04/18/15 03/06/41    Assessment   Medical Diagnosis s/p hip revision   Prior Function   Level of Independence Needs assistance with ADLs;Needs assistance with transfers   Observation/Other Assessments   Other Surveys  --  LEFS 9/80   Observation/Other Assessments-Edema    Edema --  noted 3+ pitting edema bilaterally   Coordination   Gross Motor Movements are Fluid and Coordinated --  abdominal /pelvic floor straining w/ cue for BM   Squat   Comments able to perform minisquats with rolling walker (static) 5 reps    Sit to Stand   Comments limited knee flexion due to swelling in her legs, required assistance from her husband, used momentum    ROM / Strength   AROM / PROM / Strength --  hip ext, knee flexion limited severely  by swelling in BLE   Transfers   Comments husband required in bathroom to assist with her t/f off toilet   Ambulation/Gait   Assistive device Rolling walker   Gait velocity 0.5 m/s                  [facility] Adult PT Treatment/Exercise - 04/18/15 03/06/2041    Therapeutic Activites    Therapeutic Activities --  standing m ini squats 5 reps, 5 x day   Other Therapeutic Activities referral to lymphedema specialist to decrease swelling and regain ROM. Postpone aquatic therapy and explained reason, pt was interested in aquatic therapy because she found it helpful in the past. PT explained the importance of addressing her mobility first   Neuro Re-ed    Neuro Re-ed Details  breathing technique to relax pelvic floor                  PT Short Term Goals - 04/18/15 2227/03/07    PT SHORT TERM GOAL #1   Title Pt will be referred to a lymphemda specialist to address BLE swelling and limited ROM.   Time 1   Period Days   Status Achieved             Plan - 04/18/15 2222/03/06    Clinical Impression Statement Pt is a 70 yo female who c/o tightness and achiness in BLE (R > L) and poor sleep. Pt 's clinical presentations include BLE swelling with limited ROM for functional mobility, generalized weakness, poor coordination of pelvic floor /abdominal muscles.Pt also relies on her husband and an assistive device for functional mobility.  Pt would benefit from a referral to a lymphedema specialist at this time in order to address her BLE swelling and limited ROM.  Upon completing lymphedema treatments, pt may benefit from a new order for PT to help further progress her functional mobility.  Pt is being d/c at this time.    Rehab Potential Fair   Clinical Impairments Affecting Rehab Potential co-morbidities, multiple surgeries   PT Frequency One time visit   PT Duration Other (comment)   PT Treatment/Interventions Other (comment)  Pt is being referred to a lymphedema specialist at this time for  optimal outcomes      Patient will benefit from skilled therapeutic intervention in order to improve the following deficits and impairments:  Difficulty walking, Decreased endurance, Pain, Decreased strength, Impaired flexibility, Abnormal gait, Decreased range of motion, Hypomobility, Increased edema  Visit Diagnosis: Difficulty walking  Hip stiffness, unspecified laterality  Right hip pain  Weakness generalized      G-Codes - 04/21/15 03-06-2233    Functional Assessment  Tool Used gait speed , clinical judgement    Functional Limitation Mobility: Walking and moving around   Mobility: Walking and Moving Around Current Status [phone]) At least 60 percent but less than 80 percent impaired, limited or restricted   Mobility: Walking and Moving Around Goal Status [phone]) At least 40 percent but less than 60 percent impaired, limited or restricted       Problem List Patient Active Problem List   Diagnosis Date Noted  . Episode of syncope 04/24/2015  . History of operative procedure on hip 04/24/2015  . Restless leg 04/24/2015  . Osteoporosis 03/30/2015  . Insomnia 03/30/2015  . Skin cancer 03/30/2015  . MDD (major depressive disorder) ([place]) 03/30/2015  . Migraine 03/30/2015  . GERD (gastroesophageal reflux disease) 03/30/2015  . IBS (irritable bowel syndrome) 03/30/2015  . OA (osteoarthritis) 03/30/2015  . Skin lesion of chest wall 07/17/2014  . Closed fracture of shaft of femur ([place]) 04/01/2014  . Breast cancer ([place]) 03/07/2013  . Personal history of malignant neoplasm of large intestine   . 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 Cancer ([place]) 12/24/2009  . Acute anxiety 06/11/2008  . CONSTIPATION, CHRONIC 06/11/2008  . Fibromyalgia 06/11/2008    Jerl Mina ,PT, DPT, E-RYT  05/04/2015, 10:35 PM  [place] MAIN [facility] SERVICES [address] [place], [place], [address] Phone: [phone]   Fax:   [phone]  Name: MAKIYLA LINCH MRN: [id number] Date of Birth: 1945-04-28

## 2015-05-04 NOTE — Addendum Note (Signed)
Addended by: Jerl Mina on: 05/04/2015 10:47 PM   Modules accepted: Orders

## 2015-05-06 ENCOUNTER — Ambulatory Visit: Payer: Medicare Other | Admitting: Occupational Therapy

## 2015-05-06 ENCOUNTER — Telehealth: Payer: Self-pay

## 2015-05-06 DIAGNOSIS — I890 Lymphedema, not elsewhere classified: Secondary | ICD-10-CM

## 2015-05-06 DIAGNOSIS — G4733 Obstructive sleep apnea (adult) (pediatric): Secondary | ICD-10-CM

## 2015-05-06 NOTE — Telephone Encounter (Signed)
Dr. Rosanna Randy The patient's husband came by and said that the patient had the sleep study done but was unable to sleep much.  The report stated that she had significant OSA when ever she did sleep.  Because of the severity they initiated CPAP.  They titrated her to but she continued with respiratory events.  They recommended repeat testing.  The husband said she is unable to do another test with the CPAP.  He said they told him to see if we can order an APAP.  I am not familiar with this and told him I would leave you a message.  He is going to call back next week to get your recommendation.   Thanks [employer]

## 2015-05-06 NOTE — Therapy (Signed)
[place] MAIN [facility] SERVICES [address] [place], [place], [address] Phone: [phone]   Fax:  [phone]  Occupational Therapy Treatment  Patient Details  Name: Terri Perez MRN: [id number] Date of Birth: 1945/09/07 No Data Recorded  Encounter Date: 05/06/2015      OT End of Session - 05/06/15 1655    Visit Number 5   Number of Visits 36   Date for OT Re-Evaluation 07/28/15   OT Start Time 0210   OT Stop Time 0330   OT Time Calculation (min) 80 min   Activity Tolerance Patient limited by pain;Patient tolerated treatment well;No increased pain  Pt unable to participate in 1 hr eval due to painful legs and sitting intolerance.    Behavior During Therapy Flat affect      Past Medical History  Diagnosis Date  . Personal history of fibromyalgia 2002  . Unspecified constipation   . H/O cystitis 2011  . Special screening for malignant neoplasms, colon   . Arm fracture     right forearm, d/t fall  . Personal history of malignant neoplasm of large intestine   . Bowel trouble 2010  . Cancer [facility]) 12/2009    Left UOQ breast tumor; wide excision,sn bx and whole breast radiation. Chemotherapy done. There was only a 54m area of residual tumor remaining. All sn were negative. This was ER-positive, PR-negative, HER-2 neu not over expressed tumor. The original ultrasound size was 2.6 cm(T2).  . 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 On wide excision 3 mm area of residual tumor. Sentinel node negative. ER-30%, PR-negative, HER-2 not overexpressing.  . Breast cancer ([place] 12/2009    left, chemo and radiation    Past Surgical History  Procedure Laterality Date  . Colonoscopy  2010    Dr. WAllen Norris normal  . Portacath placement  2011  . Port-a-cath removal  2013  . Breast surgery Left 2011    wide excision  . Breast biopsy Left 2011  . Breast  cyst aspiration Left 1985  . Skin cancer excision  1980    face  . Tubal ligation  1973  . Abdominal hysterectomy  1974  . Back surgery  1985    bone spurs between 5&6, used bone from left hip  . [facility]  . Femur fracture surgery Right March 2016  . Joint replacement Bilateral Jan 2016 and March 2016    hip  . Cervical discectomy    . Upper gi endoscopy  08/20/07    gastritis without hemorrhage    There were no vitals filed for this visit.      Subjective Assessment - 05/06/15 1651    Subjective  Pt presents for OT visit 5 to address BLE lymphedema. Pt is accompanied by her spouse throughout session. Pt reports she is doing LE ther ex as directed and she feels like she is able to move a little easier for hip abduction. She continues to c/o "stiffness" in R hipo.   Patient is accompained by: Family member   Pertinent History Hx fibromyalgia, Hx malignant large intestine neoplasm, colon resection 2014, abdominal hysterectomy 1974, cervical discectomy, 01/2014 B hip replacemet, 03/2014 R femur fx     Limitations difficulty walking, difficulty w/ all transfers and functional mobility, needs assistance with all basic and instrumental ADLs requiring standing, walking, extended sitting, lifting legs, Pain  limiting participation in social and cvommunity activities, ADL performance, and functional  performance of productive and leisure activities.   Repetition Increases Symptoms   Patient Stated Goals be able to move better, do more for myself, relieve pain   Currently in Pain? Yes   Pain Score --  not rated   Pain Location Hip   Pain Orientation Right   Pain Descriptors / Indicators Sore  stiff   Pain Type Chronic pain   Pain Onset 1 to 4 weeks ago                      OT Treatments/Exercises (OP) - 05/06/15 0001    ADLs   ADL Education Given Yes   Manual Therapy   Manual Therapy Edema management;Compression Bandaging;Manual Lymphatic  Drainage (MLD)   Compression Bandaging LLE gradient compression wraps applied circumferentially in gradient configuration from toes to groin as follows: toe wrap x1 under cotton stockinett; 8 cm x 1 to foot and ankle, 10 cm x 2, then 12 cm x 2- all layered over .04 x 10 cm and 12 cm Rosidol Soft foam from A-G.   Other Manual Therapy skin care                OT Education - 05/06/15 1654    Education provided Yes   Education Details Continued skilled Pt/caregiver Education  And LE ADL training throughout visit for lymphedema self care, including compression wrapping, compression garment and device wear/care, lymphatic pumping ther ex, simple self-MLD, and skin care. Discussed progress towards goals.   Person(s) Educated Patient;Spouse   Methods Explanation   Comprehension Verbalized understanding;Need further instruction             OT Long Term Goals - 04/30/15 1237    OT LONG TERM GOAL #1   Title Pt able to apply thigh-length, multi layered, gradient compression wraps with maximum caregiver assistance within 2 weeks to achieve optimal limb volume reduction.   Baseline dependent   Time 2   Period Weeks   Status New   OT LONG TERM GOAL #2   Title Pt to achieve at least 10% BLE limb volume reductions during Intensive CDT to limit LE progression, decrease infection risk and fall risk, to reduce pain/ discomfort, and to improve safe ambulation and functional mobility.   Baseline dependent    Time 12   Period Weeks   Status New   OT LONG TERM GOAL #3   Title Pt >/= 85 % compliant with all daily, LE self-care protocols, including simple self-manual lymphatic drainage (MLD), skin care, lymphatic pumping the ex, skin care, and donning/ doffing compression wraps and garments with needed level of caregiver assistance to limit LE progression and further functional decline.     Baseline dependent    Time 12   Period Weeks   Status New   OT LONG TERM GOAL #4   Title During  Management Phase CDT Pt to sustain limb volume reductions achieved during Intensive Phase CDT within 5% utilizing LE self-care protocols, appropriate compression garments/ devices, and needed level of caregiver assistance.   Baseline dependent    Time 6   Period Months   Status New   OT LONG TERM GOAL #5   Baseline dependent    Status New               Plan - 05/06/15 1656    Clinical Impression Statement RLE appears well decongested below the knee. Foot continues to  present w/ mild positive Stemmer sign. Thigh remains mildly swollen. Pt soreness continues to fluctuatae. Cognative status also somewhat fluctuating with slower processing speene noted today. Spouse becoming more accepting of caregiving roll for compression wrapping between visits., We'll focus on  caregivcer traiining next session if he is open to the idea.   Rehab Potential Good   OT Frequency 3x / week   OT Duration 12 weeks   OT Treatment/Interventions Self-care/ADL training;DME and/or AE instruction;Manual lymph drainage;Patient/family education;Compression bandaging;Therapeutic exercises;Therapeutic activities;Manual Therapy   Consulted and Agree with Plan of Care Patient;Family member/caregiver      Patient will benefit from skilled therapeutic intervention in order to improve the following deficits and impairments:  Abnormal gait, Decreased cognition, Decreased knowledge of use of DME, Decreased skin integrity, Increased edema, Impaired flexibility, Decreased mobility, Decreased activity tolerance, Decreased endurance, Decreased range of motion, Decreased strength, Decreased balance, Decreased knowledge of precautions, Difficulty walking, Impaired perceived functional ability, Pain  Visit Diagnosis: Lymphedema, not elsewhere classified    Problem List Patient Active Problem List   Diagnosis Date Noted  . Episode of syncope 04/24/2015  . History of operative procedure on hip 04/24/2015  . Restless leg  04/24/2015  . Osteoporosis 03/30/2015  . Insomnia 03/30/2015  . Skin cancer 03/30/2015  . MDD (major depressive disorder) ([place]) 03/30/2015  . Migraine 03/30/2015  . GERD (gastroesophageal reflux disease) 03/30/2015  . IBS (irritable bowel syndrome) 03/30/2015  . OA (osteoarthritis) 03/30/2015  . Skin lesion of chest wall 07/17/2014  . Closed fracture of shaft of femur ([place]) 04/01/2014  . Breast cancer ([place]) 03/07/2013  . Personal history of malignant neoplasm of large intestine   . 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 Cancer ([place]) 12/24/2009  . Acute anxiety 06/11/2008  . CONSTIPATION, CHRONIC 06/11/2008  . Fibromyalgia 06/11/2008   Andrey Spearman, MS, OTR/L, CLT-LANA 05/06/2015 5:00 PM  [place] MAIN [facility] SERVICES [address] [place], [place], [address] Phone: [phone]   Fax:  [phone]  Name: Terri Perez MRN: [id number] Date of Birth: January 16, 1946

## 2015-05-06 NOTE — Patient Instructions (Signed)
LE instructions and precautions as established- see initial eval.   

## 2015-05-07 ENCOUNTER — Ambulatory Visit: Payer: Medicare Other | Admitting: Physical Therapy

## 2015-05-07 ENCOUNTER — Encounter: Payer: Self-pay | Admitting: Family Medicine

## 2015-05-08 ENCOUNTER — Encounter: Payer: Medicare Other | Admitting: Physical Therapy

## 2015-05-08 ENCOUNTER — Ambulatory Visit: Payer: Medicare Other | Admitting: Occupational Therapy

## 2015-05-08 DIAGNOSIS — I890 Lymphedema, not elsewhere classified: Secondary | ICD-10-CM | POA: Diagnosis not present

## 2015-05-08 NOTE — Therapy (Signed)
[place] MAIN [facility] SERVICES [address] [place], [place], [address] Phone: [phone]   Fax:  [phone]  Occupational Therapy Treatment  Patient Details  Name: Terri Perez MRN: [id number] Date of Birth: 05-May-1945 No Data Recorded  Encounter Date: 05/08/2015      OT End of Session - 05/08/15 1109    Visit Number 6   Number of Visits 36   Date for OT Re-Evaluation 07/28/15   OT Start Time 1010   OT Stop Time 1058   OT Time Calculation (min) 48 min   Activity Tolerance Patient limited by pain;Patient tolerated treatment well;No increased pain;Patient limited by fatigue  Pt unable to participate in 1 hr eval due to painful legs and sitting intolerance.    Behavior During Therapy Flat affect      Past Medical History  Diagnosis Date  . Personal history of fibromyalgia 2002  . Unspecified constipation   . H/O cystitis 2011  . Special screening for malignant neoplasms, colon   . Arm fracture     right forearm, d/t fall  . Personal history of malignant neoplasm of large intestine   . Bowel trouble 2010  . Cancer [facility]) 12/2009    Left UOQ breast tumor; wide excision,sn bx and whole breast radiation. Chemotherapy done. There was only a 37m area of residual tumor remaining. All sn were negative. This was ER-positive, PR-negative, HER-2 neu not over expressed tumor. The original ultrasound size was 2.6 cm(T2).  . Malignant neoplasm of upper-outer quadrant of female breast ([facility] January 13, 2010    2+ centimeter tumor, neoadjuvant chemotherapy. On wide excision 3 mm area of residual tumor. Sentinel node negative. ER-30%, PR-negative, HER-2 not overexpressing.  . Breast cancer [place] 12/[place]    left, chemo and radiation    Past Surgical History  Procedure Laterality Date  . Colonoscopy  2010    Dr. WAllen Norris normal  . Portacath placement  2011  . Port-a-cath removal  2013  . Breast surgery Left 2011    wide excision  . Breast  biopsy Left 2011  . Breast cyst aspiration Left 1985  . Skin cancer excision  1980    face  . Tubal ligation  1973  . Abdominal hysterectomy  1974  . Back surgery  1985    bone spurs between 5&6, used bone from left hip  . [facility]  Sept [facility]  . Femur fracture surgery Right March 2016  . Joint replacement Bilateral Jan 2016 and March 2016    hip  . Cervical discectomy    . Upper gi endoscopy  08/20/07    gastritis without hemorrhage    There were no vitals filed for this visit.      Subjective Assessment - 05/08/15 1105    Subjective  Pt presents for OT visit 6 to address BLE lymphedema. Pt is accompanied by her spouse throughout session. Pt has no new complaints.   Patient is accompained by: Family member   Pertinent History Hx fibromyalgia, Hx malignant large intestine neoplasm, colon resection 2014, abdominal hysterectomy 1974, cervical discectomy, 01/2014 B hip replacemet, 03/2014 R femur fx     Limitations difficulty walking, difficulty w/ all transfers and functional mobility, needs assistance with all basic and instrumental ADLs requiring standing, walking, extended sitting, lifting legs, Pain limiting participation in social and cvommunity activities, ADL performance, and functional  performance of productive and leisure activities.   Repetition Increases Symptoms  Patient Stated Goals be able to move better, do more for myself, relieve pain   Currently in Pain? Yes   Pain Score --  not rated- chronic pain unchanged   Pain Onset 1 to 4 weeks ago                              OT Education - 05/08/15 1106    Education provided Yes   Education Details   Emphasis of LE ADL training today on patient and caregiver edu for proper compression wraps application using  Circumferential, gradient techniques and proper positioning.    Person(s) Educated Patient;Spouse   Methods Explanation;Demonstration;Tactile cues;Verbal cues;Handout    Comprehension Verbalized understanding;Returned demonstration;Verbal cues required;Tactile cues required;Need further instruction;Other (comment)  need extra time             OT Long Term Goals - 04/30/15 1237    OT LONG TERM GOAL #1   Title Pt able to apply thigh-length, multi layered, gradient compression wraps with maximum caregiver assistance within 2 weeks to achieve optimal limb volume reduction.   Baseline dependent   Time 2   Period Weeks   Status New   OT LONG TERM GOAL #2   Title Pt to achieve at least 10% BLE limb volume reductions during Intensive CDT to limit LE progression, decrease infection risk and fall risk, to reduce pain/ discomfort, and to improve safe ambulation and functional mobility.   Baseline dependent    Time 12   Period Weeks   Status New   OT LONG TERM GOAL #3   Title Pt >/= 85 % compliant with all daily, LE self-care protocols, including simple self-manual lymphatic drainage (MLD), skin care, lymphatic pumping the ex, skin care, and donning/ doffing compression wraps and garments with needed level of caregiver assistance to limit LE progression and further functional decline.     Baseline dependent    Time 12   Period Weeks   Status New   OT LONG TERM GOAL #4   Title During Management Phase CDT Pt to sustain limb volume reductions achieved during Intensive Phase CDT within 5% utilizing LE self-care protocols, appropriate compression garments/ devices, and needed level of caregiver assistance.   Baseline dependent    Time 6   Period Months   Status New   OT LONG TERM GOAL #5   Baseline dependent    Status New               Plan - 05/08/15 1110    Clinical Impression Statement By end of session caregiver was able to aply LLE full length wrap with max assist, including verbal and physical cues, demonstration, and handouts.. Pt able to provide direction without cues for feedback.   Rehab Potential Good   OT Frequency 3x / week   OT  Duration 12 weeks   OT Treatment/Interventions Self-care/ADL training;DME and/or AE instruction;Manual lymph drainage;Patient/family education;Compression bandaging;Therapeutic exercises;Therapeutic activities;Manual Therapy   Consulted and Agree with Plan of Care Patient;Family member/caregiver      Patient will benefit from skilled therapeutic intervention in order to improve the following deficits and impairments:  Abnormal gait, Decreased cognition, Decreased knowledge of use of DME, Decreased skin integrity, Increased edema, Impaired flexibility, Decreased mobility, Decreased activity tolerance, Decreased endurance, Decreased range of motion, Decreased strength, Decreased balance, Decreased knowledge of precautions, Difficulty walking, Impaired perceived functional ability, Pain  Visit Diagnosis: Lymphedema, not elsewhere classified    Problem List  Patient Active Problem List   Diagnosis Date Noted  . Episode of syncope 04/24/2015  . History of operative procedure on hip 04/24/2015  . Restless leg 04/24/2015  . Osteoporosis 03/30/2015  . Insomnia 03/30/2015  . Skin cancer 03/30/2015  . MDD (major depressive disorder) ([place]) 03/30/2015  . Migraine 03/30/2015  . GERD (gastroesophageal reflux disease) 03/30/2015  . IBS (irritable bowel syndrome) 03/30/2015  . OA (osteoarthritis) 03/30/2015  . Skin lesion of chest wall 07/17/2014  . Closed fracture of shaft of femur ([place]) 04/01/2014  . Breast cancer ([place]) 03/07/2013  . Personal history of malignant neoplasm of large intestine   . 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 Cancer ([place]) 12/24/2009  . Acute anxiety 06/11/2008  . CONSTIPATION, CHRONIC 06/11/2008  . Fibromyalgia 06/11/2008    Andrey Spearman, MS, OTR/L, [facility] 05/08/2015 11:12 AM  [place] MAIN [facility] SERVICES [address] [place], [place], [address] Phone: [phone]   Fax:   [phone]  Name: Terri Perez MRN: [id number] Date of Birth: 02-Jun-1945

## 2015-05-11 ENCOUNTER — Ambulatory Visit: Payer: Medicare Other | Admitting: Occupational Therapy

## 2015-05-11 DIAGNOSIS — I890 Lymphedema, not elsewhere classified: Secondary | ICD-10-CM

## 2015-05-11 DIAGNOSIS — N183 Chronic kidney disease, stage 3 unspecified: Secondary | ICD-10-CM | POA: Insufficient documentation

## 2015-05-11 DIAGNOSIS — M810 Age-related osteoporosis without current pathological fracture: Secondary | ICD-10-CM | POA: Insufficient documentation

## 2015-05-11 DIAGNOSIS — R768 Other specified abnormal immunological findings in serum: Secondary | ICD-10-CM | POA: Diagnosis not present

## 2015-05-11 DIAGNOSIS — M160 Bilateral primary osteoarthritis of hip: Secondary | ICD-10-CM | POA: Insufficient documentation

## 2015-05-11 NOTE — Therapy (Signed)
[place] MAIN [facility] SERVICES [address] [place], [place], [address] Phone: [phone]   Fax:  [phone]  Occupational Therapy Treatment  Patient Details  Name: Terri Perez MRN: [id number] Date of Birth: November 02, 1945 No Data Recorded  Encounter Date: 05/11/2015      OT End of Session - 05/11/15 1113    Visit Number 6   Number of Visits 36   Date for OT Re-Evaluation 07/28/15   OT Start Time 1103   OT Stop Time 1221   OT Time Calculation (min) 78 min   Equipment Utilized During Treatment [employer] samples, CircAid samples, Juzo A-G samples, Elvarex samples   Activity Tolerance Patient limited by pain;Patient tolerated treatment well;No increased pain;Patient limited by fatigue  Pt unable to participate in 1 hr eval due to painful legs and sitting intolerance.    Behavior During Therapy Flat affect      Past Medical History  Diagnosis Date  . Personal history of fibromyalgia 2002  . Unspecified constipation   . H/O cystitis 2011  . Special screening for malignant neoplasms, colon   . Arm fracture     right forearm, d/t fall  . Personal history of malignant neoplasm of large intestine   . Bowel trouble 2010  . Cancer [facility]) 12/2009    Left UOQ breast tumor; wide excision,sn bx and whole breast radiation. Chemotherapy done. There was only a 8m area of residual tumor remaining. All sn were negative. This was ER-positive, PR-negative, HER-2 neu not over expressed tumor. The original ultrasound size was 2.6 cm(T2).  . Malignant neoplasm of upper-outer quadrant of female breast ([facility] January 13, 2010    2+ centimeter tumor, neoadjuvant chemotherapy. On wide excision 3 mm area of residual tumor. Sentinel node negative. ER-30%, PR-negative, HER-2 not overexpressing.  . [place]    left, chemo and radiation    Past Surgical History  Procedure Laterality Date  . Colonoscopy  2010    Dr. WAllen Norris normal  .  Portacath placement  2011  . Port-a-cath removal  2013  . Breast surgery Left 2011    wide excision  . Breast biopsy Left 2011  . Breast cyst aspiration Left 1985  . Skin cancer excision  1980    face  . Tubal ligation  1973  . Abdominal hysterectomy  1974  . Back surgery  1985    bone spurs between 5&6, used bone from left hip  . [facility]  . Femur fracture surgery Right March 2016  . Joint replacement Bilateral Jan 2016 and March 2016    hip  . Cervical discectomy    . Upper gi endoscopy  08/20/07    gastritis without hemorrhage    There were no vitals filed for this visit.      Subjective Assessment - 05/11/15 1107    Subjective  Pt presents for OT visit 7 to address BLE lymphedema. Pt is accompanied by her spouse throughout session. Pt reports , "He did a good job with the wraps over the weekend."   Patient is accompained by: Family member   Pertinent History Hx fibromyalgia, Hx malignant large intestine neoplasm, colon resection 2014, abdominal hysterectomy 1974, cervical discectomy, 01/2014 B hip replacemet, 03/2014 R femur fx     Limitations difficulty walking, difficulty w/ all transfers and functional mobility, needs assistance with all basic and instrumental ADLs requiring standing, walking, extended sitting, lifting legs, Pain  limiting participation in social and cvommunity activities, ADL performance, and functional  performance of productive and leisure activities.   Repetition Increases Symptoms   Patient Stated Goals be able to move better, do more for myself, relieve pain   Currently in Pain? Yes   Pain Score --  not rated numerically- unchanged   Pain Onset 1 to 4 weeks ago                      OT Treatments/Exercises (OP) - 05/11/15 0001    ADLs   ADL Education Given Yes   Manual Therapy   Manual Therapy Edema management;Compression Bandaging;Manual Lymphatic Drainage (MLD)   Manual Lymphatic Drainage (MLD) Manual  lymph drainage (MLD) to LLE in supine utilizing functional inguinal lymph nodes and deep abdominal lymphatics as is customary for non-cancer related lower extremity LE, including bilateral "short neck" sequence, J strokes to sub and supraclavicular LN, deep abdominal pathways, functional inguinal LN, lower extremity proximal to distal w/ emphasis on medial knee bottleneck and politeal LN. Performed fibrosis technique to B maleoli and distal  legs to address fatty fibrosis. Good tolerance.   Compression Bandaging LLE gradient compression wraps applied circumferentially in gradient configuration from toes to groin as follows: toe wrap x1 under cotton stockinett; 8 cm x 1 to foot and ankle, 10 cm x 2, then 12 cm x 2- all layered over .04 x 10 cm and 12 cm Rosidol Soft foam from A-G.   Other Manual Therapy skin care                OT Education - 05/11/15 1109    Education provided Yes   Education Details Emphasis of skilled LE self-care training today on compression garment/ devices, including pros and cons of  standard elastic vs alternative, adjustable daytime garments, proper fit and function,  wear and care regimes, and assistive devices, and options for assistive devices for donning and doffing.   Person(s) Educated Spouse;Patient   Methods Explanation;Demonstration;Handout   Comprehension Verbalized understanding;Need further instruction             OT Long Term Goals - 04/30/15 1237    OT LONG TERM GOAL #1   Title Pt able to apply thigh-length, multi layered, gradient compression wraps with maximum caregiver assistance within 2 weeks to achieve optimal limb volume reduction.   Baseline dependent   Time 2   Period Weeks   Status New   OT LONG TERM GOAL #2   Title Pt to achieve at least 10% BLE limb volume reductions during Intensive CDT to limit LE progression, decrease infection risk and fall risk, to reduce pain/ discomfort, and to improve safe ambulation and functional  mobility.   Baseline dependent    Time 12   Period Weeks   Status New   OT LONG TERM GOAL #3   Title Pt >/= 85 % compliant with all daily, LE self-care protocols, including simple self-manual lymphatic drainage (MLD), skin care, lymphatic pumping the ex, skin care, and donning/ doffing compression wraps and garments with needed level of caregiver assistance to limit LE progression and further functional decline.     Baseline dependent    Time 12   Period Weeks   Status New   OT LONG TERM GOAL #4   Title During Management Phase CDT Pt to sustain limb volume reductions achieved during Intensive Phase CDT within 5% utilizing LE self-care protocols, appropriate compression garments/ devices, and needed level of caregiver assistance.  Baseline dependent    Time 6   Period Months   Status New   OT LONG TERM GOAL #5   Baseline dependent    Status New               Plan - 05/11/15 1226    Clinical Impression Statement Spouse did well with compression wrapping over the weekend according to patient and Mr. Wadley report. RLE swelling is well controlled today, espite c/o worsening hip pain. Discussed  and demonstrated multiple day time compression options during session, including adjustable CircAid and [employer], and also provided demos of standard and custom stockings. Pt and therapist have not yet determined optimal garment selection for day time use. We'll continue to experiment w/ wraps until we have narrowed down  the most funtionally appropriate choice. Pt interested in further exploring Flexitouch sequential pneumatic compression pump. Hand out provided. This device will enable Pt to be as independent as possible w/ LE self care for MLD. Rep will assess insurance benefits and contact Pt directly. We'll schedule trial in clinic PRN.   Rehab Potential Good   OT Frequency 3x / week   OT Duration 12 weeks   OT Treatment/Interventions Self-care/ADL training;DME and/or AE  instruction;Manual lymph drainage;Patient/family education;Compression bandaging;Therapeutic exercises;Therapeutic activities;Manual Therapy   Consulted and Agree with Plan of Care Patient;Family member/caregiver      Patient will benefit from skilled therapeutic intervention in order to improve the following deficits and impairments:  Abnormal gait, Decreased cognition, Decreased knowledge of use of DME, Decreased skin integrity, Increased edema, Impaired flexibility, Decreased mobility, Decreased activity tolerance, Decreased endurance, Decreased range of motion, Decreased strength, Decreased balance, Decreased knowledge of precautions, Difficulty walking, Impaired perceived functional ability, Pain  Visit Diagnosis: Lymphedema, not elsewhere classified    Problem List Patient Active Problem List   Diagnosis Date Noted  . Episode of syncope 04/24/2015  . History of operative procedure on hip 04/24/2015  . Restless leg 04/24/2015  . Osteoporosis 03/30/2015  . Insomnia 03/30/2015  . Skin cancer 03/30/2015  . MDD (major depressive disorder) ([place]) 03/30/2015  . Migraine 03/30/2015  . GERD (gastroesophageal reflux disease) 03/30/2015  . IBS (irritable bowel syndrome) 03/30/2015  . OA (osteoarthritis) 03/30/2015  . Skin lesion of chest wall 07/17/2014  . Closed fracture of shaft of femur ([place]) 04/01/2014  . Breast cancer ([place]) 03/07/2013  . Personal history of malignant neoplasm of large intestine   . Malignant neoplasm of upper-outer quadrant of female breast ([place])   . Cancer ([place]) 12/24/2009  . Acute anxiety 06/11/2008  . CONSTIPATION, CHRONIC 06/11/2008  . Fibromyalgia 06/11/2008    Andrey Spearman, MS, OTR/L, [facility] 05/11/2015 12:32 PM  [place] MAIN [facility] SERVICES [address] [place], [place], [address] Phone: [phone]   Fax:  [phone]  Name: Terri Perez MRN: [id number] Date of Birth: 27-Mar-1945

## 2015-05-11 NOTE — Patient Instructions (Signed)
LE instructions and precautions as established- see initial eval.   

## 2015-05-12 ENCOUNTER — Inpatient Hospital Stay: Payer: Medicare Other | Attending: Oncology

## 2015-05-12 ENCOUNTER — Encounter: Payer: Self-pay | Admitting: Family Medicine

## 2015-05-12 ENCOUNTER — Inpatient Hospital Stay (HOSPITAL_BASED_OUTPATIENT_CLINIC_OR_DEPARTMENT_OTHER): Payer: Medicare Other | Admitting: Family Medicine

## 2015-05-12 ENCOUNTER — Ambulatory Visit: Payer: Medicare Other | Admitting: Physical Therapy

## 2015-05-12 DIAGNOSIS — Z85038 Personal history of other malignant neoplasm of large intestine: Secondary | ICD-10-CM

## 2015-05-12 DIAGNOSIS — Z7982 Long term (current) use of aspirin: Secondary | ICD-10-CM

## 2015-05-12 DIAGNOSIS — Z8781 Personal history of (healed) traumatic fracture: Secondary | ICD-10-CM

## 2015-05-12 DIAGNOSIS — C50419 Malignant neoplasm of upper-outer quadrant of unspecified female breast: Secondary | ICD-10-CM

## 2015-05-12 DIAGNOSIS — Z85828 Personal history of other malignant neoplasm of skin: Secondary | ICD-10-CM | POA: Insufficient documentation

## 2015-05-12 DIAGNOSIS — Z170 Estrogen receptor positive status [ER+]: Secondary | ICD-10-CM | POA: Insufficient documentation

## 2015-05-12 DIAGNOSIS — Z8041 Family history of malignant neoplasm of ovary: Secondary | ICD-10-CM | POA: Diagnosis not present

## 2015-05-12 DIAGNOSIS — F419 Anxiety disorder, unspecified: Secondary | ICD-10-CM

## 2015-05-12 DIAGNOSIS — R5381 Other malaise: Secondary | ICD-10-CM | POA: Insufficient documentation

## 2015-05-12 DIAGNOSIS — R5383 Other fatigue: Secondary | ICD-10-CM | POA: Insufficient documentation

## 2015-05-12 DIAGNOSIS — Z853 Personal history of malignant neoplasm of breast: Secondary | ICD-10-CM | POA: Insufficient documentation

## 2015-05-12 DIAGNOSIS — I890 Lymphedema, not elsewhere classified: Secondary | ICD-10-CM

## 2015-05-12 DIAGNOSIS — Z9223 Personal history of estrogen therapy: Secondary | ICD-10-CM | POA: Insufficient documentation

## 2015-05-12 DIAGNOSIS — Z803 Family history of malignant neoplasm of breast: Secondary | ICD-10-CM | POA: Diagnosis not present

## 2015-05-12 DIAGNOSIS — I499 Cardiac arrhythmia, unspecified: Secondary | ICD-10-CM | POA: Diagnosis not present

## 2015-05-12 DIAGNOSIS — Z79899 Other long term (current) drug therapy: Secondary | ICD-10-CM | POA: Diagnosis not present

## 2015-05-12 DIAGNOSIS — C50412 Malignant neoplasm of upper-outer quadrant of left female breast: Secondary | ICD-10-CM

## 2015-05-12 LAB — CBC WITH DIFFERENTIAL/PLATELET
BASOS PCT: 1 %
Basophils Absolute: 0 10*3/uL (ref 0–0.1)
EOS PCT: 2 %
Eosinophils Absolute: 0.1 10*3/uL (ref 0–0.7)
HEMATOCRIT: 37.9 % (ref 35.0–47.0)
Hemoglobin: 12.7 g/dL (ref 12.0–16.0)
Lymphocytes Relative: 28 %
Lymphs Abs: 1.8 10*3/uL (ref 1.0–3.6)
MCH: 30.5 pg (ref 26.0–34.0)
MCHC: 33.4 g/dL (ref 32.0–36.0)
MCV: 91.1 fL (ref 80.0–100.0)
MONO ABS: 0.6 10*3/uL (ref 0.2–0.9)
MONOS PCT: 9 %
NEUTROS ABS: 4.1 10*3/uL (ref 1.4–6.5)
Neutrophils Relative %: 60 %
PLATELETS: 231 10*3/uL (ref 150–440)
RBC: 4.16 MIL/uL (ref 3.80–5.20)
RDW: 15 % — AB (ref 11.5–14.5)
WBC: 6.6 10*3/uL (ref 3.6–11.0)

## 2015-05-12 LAB — COMPREHENSIVE METABOLIC PANEL
ALBUMIN: 4.2 g/dL (ref 3.5–5.0)
ALT: 17 U/L (ref 14–54)
ANION GAP: 5 (ref 5–15)
AST: 19 U/L (ref 15–41)
Alkaline Phosphatase: 84 U/L (ref 38–126)
BILIRUBIN TOTAL: 0.4 mg/dL (ref 0.3–1.2)
BUN: 23 mg/dL — ABNORMAL HIGH (ref 6–20)
CHLORIDE: 106 mmol/L (ref 101–111)
CO2: 28 mmol/L (ref 22–32)
Calcium: 9.9 mg/dL (ref 8.9–10.3)
Creatinine, Ser: 1.07 mg/dL — ABNORMAL HIGH (ref 0.44–1.00)
GFR calc Af Amer: 60 mL/min — ABNORMAL LOW (ref >60)
GFR, EST NON AFRICAN AMERICAN: 51 mL/min — AB (ref >60)
Glucose, Bld: 107 mg/dL — ABNORMAL HIGH (ref 65–99)
POTASSIUM: 4.5 mmol/L (ref 3.5–5.1)
Sodium: 139 mmol/L (ref 135–145)
TOTAL PROTEIN: 7.8 g/dL (ref 6.5–8.1)

## 2015-05-12 NOTE — Progress Notes (Signed)
[place]  [phone]  [phone]     AKITA MAXIM DOB: 11-29-1945  MR#: [id number]  QAS#:[id number]  Patient Care Team: Jerrol Banana., MD as PCP - General (Family Medicine) Robert Bellow, MD (General Surgery)  CHIEF COMPLAINT:  Chief Complaint  Patient presents with  . Breast Cancer    INTERVAL HISTORY:  Patient is here for further evaluation and treatment consideration regarding carcinoma of the left breast. Patient was diagnosed with an infiltrating ductal carcinoma of the left breast in 2011. She is status post lumpectomy and sentinel lymph node assessment. She was started on Femara and was unable to tolerate it, then switched to tamoxifen and also unable to tolerate. Her most recent mammogram was in June 2016 that was reported as BI-RADS 2, benign. She continues to follow with Dr. Bary Castilla closely and he orders her mammograms. She has recently had a fracture of the right hip as well as a fracture of her right femur. She is currently going to lymphedema clinic due to ongoing swelling and working with physical therapy. Otherwise she has no acute complaints other than her anxiety.  REVIEW OF SYSTEMS:   Review of Systems  Constitutional: Positive for malaise/fatigue. Negative for fever, chills, weight loss and diaphoresis.  HENT: Negative.   Eyes: Negative.   Respiratory: Negative for cough, hemoptysis, sputum production, shortness of breath and wheezing.   Cardiovascular: Positive for leg swelling. Negative for chest pain, palpitations, orthopnea, claudication and PND.  Gastrointestinal: Negative for heartburn, nausea, vomiting, abdominal pain, diarrhea, constipation, blood in stool and melena.  Genitourinary: Negative.   Musculoskeletal: Negative.   Skin: Negative.   Neurological: Negative for dizziness, tingling, focal weakness, seizures and weakness.  Endo/Heme/Allergies: Does not bruise/bleed easily.  Psychiatric/Behavioral:  Negative for depression. The patient is nervous/anxious. The patient does not have insomnia.     As per HPI. Otherwise, a complete review of systems is negatve.  ONCOLOGY HISTORY: Oncology History   1.  Carcinoma of breast  infiltrating ductal carcinoma (left breast)  T2, N0, M0 tumor  ER 30%, PR negative, HER-2 receptor 2+ by IHC FISH study, negative there is no   gene amplification. 2.  Status post lumpectomy and Sentinel lymph node.  Biopsy.  December 2 012 AJCC Staging: [id number] Stage Grouping:  p1a  Had the MammoSite March 2012  3.  Started on Femara March of 2012. 4.  Patient has been taken off femara in September of 2015 because of progressing bony pain 5.  Started on tamoxifen which patient did not tolerate well with a single dose. Patient became sick and started throwing up and patient discontinued tamoxifen.      Breast cancer ([place])   03/07/2013 Initial Diagnosis Breast cancer [facility])    PAST MEDICAL HISTORY: Past Medical History  Diagnosis Date  . Personal history of fibromyalgia 2002  . Unspecified constipation   . H/O cystitis 2011  . Special screening for malignant neoplasms, colon   . Arm fracture     right forearm, d/t fall  . Personal history of malignant neoplasm of large intestine   . Bowel trouble 2010  . Cancer [facility]) 12/2009    Left UOQ breast tumor; wide excision,sn bx and whole breast radiation. Chemotherapy done. There was only a 12m area of residual tumor remaining. All sn were negative. This was ER-positive, PR-negative, HER-2 neu not over expressed tumor. The original ultrasound size was 2.6 cm(T2).  . Malignant ne[place] of [place] quadrant of [place]  [place], 2010    2+ centimeter tumor, neoadjuvant chemotherapy. On wide excision 3 mm area of residual tumor. Sentinel node negative. ER-30%, PR-negative, HER-2 not overexpressing.  . Breast cancer ([place]) 12/2009    left, chemo and radiation    PAST SURGICAL HISTORY: Past Surgical  History  Procedure Laterality Date  . Colonoscopy  2010    Dr. Allen Norris, normal  . Portacath placement  2011  . Port-a-cath removal  2013  . Breast surgery Left 2011    wide excision  . Breast biopsy Left 2011  . Breast cyst aspiration Left 1985  . Skin cancer excision  1980    face  . Tubal ligation  1973  . Abdominal hysterectomy  1974  . Back surgery  1985    bone spurs between 5&6, used bone from left hip  . Colon resection  Sept 2014    [facility]   . Femur fracture surgery Right March 2016  . Joint replacement Bilateral Jan 2016 and March 2016    hip  . Cervical discectomy    . Upper gi endoscopy  08/20/07    gastritis without hemorrhage    FAMILY HISTORY Family History  Problem Relation Age of Onset  . Cancer Brother     colon  . Cancer Other     breast and ovarian cancers, relationships not listed  . Cancer Other 19    niece  . Stroke Mother   . Osteoporosis Mother   . Depression Sister   . Depression Sister   . Depression Sister     GYNECOLOGIC HISTORY:  No LMP recorded. Patient has had a hysterectomy.     ADVANCED DIRECTIVES:    HEALTH MAINTENANCE: Social History  Substance Use Topics  . Smoking status: Never Smoker   . Smokeless tobacco: Never Used  . Alcohol Use: No     Mammogram: June 2016   Allergies  Allergen Reactions  . Alendronate Nausea Only    Gi symptoms  . Gluten Meal Nausea Only    Other reaction(s): NAUSEA Other reaction(s): NAUSEA Other reaction(s): NAUSEA  . Lac Bovis Nausea And Vomiting  . Milk-Related Compounds   . Nsaids     GI upset.  . Other Other (See Comments)    Other reaction(s): OTHER  . Oxycodone-Acetaminophen   . Oxycodone-Acetaminophen Other (See Comments)    Other reaction(s): NAUSEA  . Vicodin [Hydrocodone-Acetaminophen] Nausea Only, Diarrhea and Nausea And Vomiting  . Wheat Bran Other (See Comments)  . Sulfa Antibiotics Rash    Rash from steri strips  . Tape Rash    Rash from steri strips    Current  Outpatient Prescriptions  Medication Sig Dispense Refill  . ALPRAZolam (XANAX) 0.5 MG tablet Take 1 tablet (0.5 mg total) by mouth every 4 (four) hours as needed. for pain 120 tablet 5  . aspirin-acetaminophen-caffeine (EXCEDRIN MIGRAINE) 250-250-65 MG tablet Take by mouth.    . calcium carbonate 1250 MG capsule Take 1,250 mg by mouth 2 (two) times daily with a meal. Reported on 04/24/2015    . omeprazole (PRILOSEC) 20 MG capsule Take by mouth.    . traMADol (ULTRAM) 50 MG tablet Take 1 tablet (50 mg total) by mouth every 4 (four) hours as needed. 180 tablet 5   No current facility-administered medications for this visit.    OBJECTIVE: There were no vitals taken for this visit.   There is no weight on file to calculate BMI.    ECOG FS:2 - Symptomatic, <50% confined  to bed  General: Patient is in wheelchair due to right lower extremity edema, highly anxious. Eyes: Pink conjunctiva, anicteric sclera. HEENT: Normocephalic, moist mucous membranes, clear oropharnyx. Lungs: Clear to auscultation bilaterally. Heart: Irregular rhythm and rate, new for patient. No rubs, murmurs. Abdomen: Soft, nontender, nondistended. No organomegaly noted, normoactive bowel sounds. Breast: Patient refused. Musculoskeletal: Right lower extremity edema, no cyanosis, or clubbing. Neuro: Alert, answering all questions appropriately. Cranial nerves grossly intact. Skin: No rashes or petechiae noted. Psych: Highly anxious    LAB RESULTS:  Appointment on 05/12/2015  Component Date Value Ref Range Status  . WBC 05/12/2015 6.6  3.6 - 11.0 K/uL Final  . RBC 05/12/2015 4.16  3.80 - 5.20 MIL/uL Final  . Hemoglobin 05/12/2015 12.7  12.0 - 16.0 g/dL Final  . HCT 05/12/2015 37.9  35.0 - 47.0 % Final  . MCV 05/12/2015 91.1  80.0 - 100.0 fL Final  . MCH 05/12/2015 30.5  26.0 - 34.0 pg Final  . MCHC 05/12/2015 33.4  32.0 - 36.0 g/dL Final  . RDW 05/12/2015 15.0* 11.5 - 14.5 % Final  . Platelets 05/12/2015 231  150 - 440  K/uL Final  . Neutrophils Relative % 05/12/2015 60   Final  . Neutro Abs 05/12/2015 4.1  1.4 - 6.5 K/uL Final  . Lymphocytes Relative 05/12/2015 28   Final  . Lymphs Abs 05/12/2015 1.8  1.0 - 3.6 K/uL Final  . Monocytes Relative 05/12/2015 9   Final  . Monocytes Absolute 05/12/2015 0.6  0.2 - 0.9 K/uL Final  . Eosinophils Relative 05/12/2015 2   Final  . Eosinophils Absolute 05/12/2015 0.1  0 - 0.7 K/uL Final  . Basophils Relative 05/12/2015 1   Final  . Basophils Absolute 05/12/2015 0.0  0 - 0.1 K/uL Final  . Sodium 05/12/2015 139  135 - 145 mmol/L Final  . Potassium 05/12/2015 4.5  3.5 - 5.1 mmol/L Final  . Chloride 05/12/2015 106  101 - 111 mmol/L Final  . CO2 05/12/2015 28  22 - 32 mmol/L Final  . Glucose, Bld 05/12/2015 107* 65 - 99 mg/dL Final  . BUN 05/12/2015 23* 6 - 20 mg/dL Final  . Creatinine, Ser 05/12/2015 1.07* 0.44 - 1.00 mg/dL Final  . Calcium 05/12/2015 9.9  8.9 - 10.3 mg/dL Final  . Total Protein 05/12/2015 7.8  6.5 - 8.1 g/dL Final  . Albumin 05/12/2015 4.2  3.5 - 5.0 g/dL Final  . AST 05/12/2015 19  15 - 41 U/L Final  . ALT 05/12/2015 17  14 - 54 U/L Final  . Alkaline Phosphatase 05/12/2015 84  38 - 126 U/L Final  . Total Bilirubin 05/12/2015 0.4  0.3 - 1.2 mg/dL Final  . GFR calc non Af Amer 05/12/2015 51* >60 mL/min Final  . GFR calc Af Amer 05/12/2015 60* >60 mL/min Final   Comment: (NOTE) The eGFR has been calculated using the CKD EPI equation. This calculation has not been validated in all clinical situations. eGFR's persistently <60 mL/min signify possible Chronic Kidney Disease.   . Anion gap 05/12/2015 5  5 - 15 Final    STUDIES: No results found.  ASSESSMENT:  Carcinoma of left breast, stage IA, T1a N0 M0.  PLAN:   1. Carcinoma of left breast. Patient is status post lumpectomy and sentinel lymph node biopsy from December 2011. Patient has trialed femara as well as tamoxifen and was unable to tolerate either. She is currently on no anti-hormonal  medications. Her most recent mammogram was June 2016  reported as BI-RADS 2, benign. Clinically there is no evidence of recurrent disease however, patient refused breast exam today. She states that she would prefer to have her mammogram performed in June and then have Dr. Bary Castilla performed breast exam in his clinic. Discussed with patient the importance of monthly self breast exams, education provided as to the proper technique. 2. Cardiac arrhythmia. Patient was noted to have an irregular rhythm while in the clinic today which she reports as needed. She relates these "palpitations" to her stress level which is very high today. Instructed to discuss these findings with Dr. Rosanna Randy on her next follow-up appointment. We will forward this note to his office as well. 3. Right lower extremity lymphedema. Secondary to fracture of right hip as well as fracture of femur. Per patient and family member edema is improving with visits to lymphedema clinic as well as physical therapy.  Patient should return to our clinic in approximately 1 year for further evaluation. Patient expressed understanding and was in agreement with this plan. She also understands that She can call clinic at any time with any questions, concerns, or complaints.   Dr. Oliva Bustard was available for consultation and review of plan of care for this patient.  Stage IA, T1 1 N0 M0.   Evlyn Kanner, NP   05/12/2015 3:25 PM

## 2015-05-13 ENCOUNTER — Telehealth: Payer: Self-pay

## 2015-05-13 ENCOUNTER — Encounter: Payer: Medicare Other | Admitting: Occupational Therapy

## 2015-05-13 DIAGNOSIS — F419 Anxiety disorder, unspecified: Secondary | ICD-10-CM

## 2015-05-13 MED ORDER — ALPRAZOLAM 0.5 MG PO TABS: 0.5000 mg | ORAL_TABLET | ORAL | Status: DC | PRN

## 2015-05-13 NOTE — Telephone Encounter (Signed)
Please review-aa 

## 2015-05-13 NOTE — Telephone Encounter (Signed)
RX called in-aa 

## 2015-05-13 NOTE — Telephone Encounter (Signed)
#  [phone]

## 2015-05-13 NOTE — Telephone Encounter (Signed)
Patient is requesting a new RX for Alprazolam be called in a [facility] pharmacy for quantity #90 instead of #120. Patient states she does not need #120 monthly, and she is also receiving a letter from insurance that they will not cover #120. CB# [phone]

## 2015-05-14 ENCOUNTER — Ambulatory Visit: Payer: Medicare Other | Admitting: Oncology

## 2015-05-14 ENCOUNTER — Other Ambulatory Visit: Payer: Medicare Other

## 2015-05-14 NOTE — Telephone Encounter (Signed)
I cannot find the sleep study in the chart.

## 2015-05-14 NOTE — Telephone Encounter (Signed)
Order put in for APAP, sleep study is in the chart and Dr. Rosanna Randy reviewed that. Sarah please order thank you-aa Patient advised that this is been worked Nutritional therapist

## 2015-05-15 ENCOUNTER — Encounter: Payer: Medicare Other | Admitting: Occupational Therapy

## 2015-05-15 ENCOUNTER — Ambulatory Visit: Payer: Medicare Other | Admitting: Occupational Therapy

## 2015-05-18 ENCOUNTER — Ambulatory Visit: Payer: Medicare Other | Admitting: Occupational Therapy

## 2015-05-20 ENCOUNTER — Ambulatory Visit: Payer: Medicare Other | Admitting: Occupational Therapy

## 2015-05-20 DIAGNOSIS — I890 Lymphedema, not elsewhere classified: Secondary | ICD-10-CM

## 2015-05-20 NOTE — Patient Instructions (Signed)
LE instructions and precautions as established- see initial eval.   

## 2015-05-20 NOTE — Therapy (Signed)
[place] MAIN [facility] SERVICES [address] [place], [place], [address] Phone: [phone]   Fax:  [phone]  Occupational Therapy Treatment  Patient Details  Name: Terri Perez MRN: [id number] Date of Birth: May 17, 1945 No Data Recorded  Encounter Date: 05/20/2015      OT End of Session - 05/20/15 1718    Visit Number 8   Number of Visits 36   Date for OT Re-Evaluation 07/28/15   OT Start Time 0201   OT Stop Time 0312   OT Time Calculation (min) 71 min   Equipment Utilized During Treatment [employer] samples, CircAid samples, Juzo A-G samples, Elvarex samples   Activity Tolerance Patient limited by pain;Patient tolerated treatment well;No increased pain;Patient limited by fatigue  Pt unable to participate in 1 hr eval due to painful legs and sitting intolerance.    Behavior During Therapy Flat affect      Past Medical History  Diagnosis Date  . Personal history of fibromyalgia 2002  . Unspecified constipation   . H/O cystitis 2011  . Special screening for malignant neoplasms, colon   . Arm fracture     right forearm, d/t fall  . Personal history of malignant neoplasm of large intestine   . Bowel trouble 2010  . Cancer [facility]) 12/2009    Left UOQ breast tumor; wide excision,sn bx and whole breast radiation. Chemotherapy done. There was only a 5m area of residual tumor remaining. All sn were negative. This was ER-positive, PR-negative, HER-2 neu not over expressed tumor. The original ultrasound size was 2.6 cm(T2).  . 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 On wide excision 3 mm area of residual tumor. Sentinel node negative. ER-30%, PR-negative, HER-2 not overexpressing.  . [place]    left, chemo and radiation    Past Surgical History  Procedure Laterality Date  . Colonoscopy  2010    Dr. WAllen Norris normal  .  Portacath placement  2011  . Port-a-cath removal  2013  . Breast surgery Left 2011    wide excision  . Breast biopsy Left 2011  . Breast cyst aspiration Left 1985  . Skin cancer excision  1980    face  . Tubal ligation  1973  . Abdominal hysterectomy  1974  . Back surgery  1985    bone spurs between 5&6, used bone from left hip  . [facility]  . Femur fracture surgery Right March 2016  . Joint replacement Bilateral Jan 2016 and March 2016    hip  . Cervical discectomy    . Upper gi endoscopy  08/20/07    gastritis without hemorrhage    There were no vitals filed for this visit.      Subjective Assessment - 05/20/15 1715    Subjective  Pt presents for OT visit 8 to address BLE lymphedema. Pt is accompanied by her spouse throughout session. Pt reports , "He did a good job with the wraps again. I've been doing the exercises once a day, but my leg gets so stiff."   Patient is accompained by: Family member   Pertinent History Hx fibromyalgia, Hx malignant large intestine neoplasm, colon resection 2014, abdominal hysterectomy 1974, cervical discectomy, 01/2014 B hip replacemet, 03/2014 R femur fx     Limitations difficulty walking, difficulty w/ all transfers and functional mobility, needs assistance with all  basic and instrumental ADLs requiring standing, walking, extended sitting, lifting legs, Pain limiting participation in social and cvommunity activities, ADL performance, and functional  performance of productive and leisure activities.   Repetition Increases Symptoms   Patient Stated Goals be able to move better, do more for myself, relieve pain   Currently in Pain? No/denies   Pain Onset 1 to 4 weeks ago                      OT Treatments/Exercises (OP) - 05/20/15 0001    ADLs   ADL Education Given Yes   Manual Therapy   Manual therapy comments Completed anatomical RLE measurements for compression garments   Manual Lymphatic Drainage  (MLD) Manual lymph drainage (MLD) to LLE in supine utilizing functional inguinal lymph nodes and deep abdominal lymphatics as is customary for non-cancer related lower extremity LE, including bilateral "short neck" sequence, J strokes to sub and supraclavicular LN, deep abdominal pathways, functional inguinal LN, lower extremity proximal to distal w/ emphasis on medial knee bottleneck and politeal LN. Performed fibrosis technique to B maleoli and distal  legs to address fatty fibrosis. Good tolerance.   Compression Bandaging LLE gradient compression wraps applied circumferentially in gradient configuration from toes to groin as follows: toe wrap x1 under cotton stockinett; 8 cm x 1 to foot and ankle, 10 cm x 2, then 12 cm x 2- all layered over .04 x 10 cm and 12 cm Rosidol Soft foam from A-G.   Other Manual Therapy skin care                OT Education - 05/20/15 1717    Education provided Yes   Education Details Emphasis of skilled LE self-care training today on compression garment/ device options and recommendations.   Person(s) Educated Patient;Spouse   Methods Explanation;Demonstration   Comprehension Verbalized understanding;Need further instruction             OT Long Term Goals - 04/30/15 1237    OT LONG TERM GOAL #1   Title Pt able to apply thigh-length, multi layered, gradient compression wraps with maximum caregiver assistance within 2 weeks to achieve optimal limb volume reduction.   Baseline dependent   Time 2   Period Weeks   Status New   OT LONG TERM GOAL #2   Title Pt to achieve at least 10% BLE limb volume reductions during Intensive CDT to limit LE progression, decrease infection risk and fall risk, to reduce pain/ discomfort, and to improve safe ambulation and functional mobility.   Baseline dependent    Time 12   Period Weeks   Status New   OT LONG TERM GOAL #3   Title Pt >/= 85 % compliant with all daily, LE self-care protocols, including simple  self-manual lymphatic drainage (MLD), skin care, lymphatic pumping the ex, skin care, and donning/ doffing compression wraps and garments with needed level of caregiver assistance to limit LE progression and further functional decline.     Baseline dependent    Time 12   Period Weeks   Status New   OT LONG TERM GOAL #4   Title During Management Phase CDT Pt to sustain limb volume reductions achieved during Intensive Phase CDT within 5% utilizing LE self-care protocols, appropriate compression garments/ devices, and needed level of caregiver assistance.   Baseline dependent    Time 6   Period Months   Status New   OT LONG TERM GOAL #5   Baseline dependent  Status New               Plan - 05/20/15 1719    Clinical Impression Statement RLE swelling very well controlled today, despite Pt c/o increased stiffness and heaviness. Skin is flaking slightly, but hydration is very good. No signs/ symptoms of infection. Pt doing a nice job ass9isting her spouse w/ wrapping by providing feedback. Garment measurements indicate Pt will fit into OFS Juzo ccl2 thigh length garment. Pt and spouse agree w/ plan to start w/ chap style garment in effort to improve comfort. Sent demographics and specifications to vendor.   Rehab Potential Good   OT Frequency 3x / week   OT Duration 12 weeks   OT Treatment/Interventions Self-care/ADL training;DME and/or AE instruction;Manual lymph drainage;Patient/family education;Compression bandaging;Therapeutic exercises;Therapeutic activities;Manual Therapy   Consulted and Agree with Plan of Care Patient;Family member/caregiver      Patient will benefit from skilled therapeutic intervention in order to improve the following deficits and impairments:  Abnormal gait, Decreased cognition, Decreased knowledge of use of DME, Decreased skin integrity, Increased edema, Impaired flexibility, Decreased mobility, Decreased activity tolerance, Decreased endurance, Decreased  range of motion, Decreased strength, Decreased balance, Decreased knowledge of precautions, Difficulty walking, Impaired perceived functional ability, Pain  Visit Diagnosis: Lymphedema, not elsewhere classified    Problem List Patient Active Problem List   Diagnosis Date Noted  . 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 Chronic kidney disease (CKD), stage III (moderate) 05/11/2015  . Elevated rheumatoid factor 05/11/2015  . Primary osteoarthritis of both hips 05/11/2015  . Episode of syncope 04/24/2015  . History of operative procedure on hip 04/24/2015  . Restless leg 04/24/2015  . Osteoporosis 03/30/2015  . Insomnia 03/30/2015  . Skin cancer 03/30/2015  . MDD (major depressive disorder) ([place]) 03/30/2015  . Migraine 03/30/2015  . GERD (gastroesophageal reflux disease) 03/30/2015  . IBS (irritable bowel syndrome) 03/30/2015  . OA (osteoarthritis) 03/30/2015  . Skin lesion of chest wall 07/17/2014  . Closed fracture of shaft of femur ([place]) 04/01/2014  . Fracture of bone adjacent to prosthesis ([place]) 04/01/2014  . Breast cancer ([place]) 03/07/2013  . Personal history of malignant neoplasm of large intestine   . 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 Acute anxiety 06/11/2008  . CONSTIPATION, CHRONIC 06/11/2008  . Fibromyalgia 06/11/2008    Andrey Spearman, MS, OTR/L, [facility] 05/20/2015 5:23 PM  [place] MAIN [facility] SERVICES [address] [place], [place], [address] Phone: [phone]   Fax:  [phone]  Name: Terri Perez MRN: [id number] Date of Birth: May 01, 1945

## 2015-05-22 ENCOUNTER — Ambulatory Visit: Payer: Medicare Other | Admitting: Occupational Therapy

## 2015-05-22 DIAGNOSIS — I890 Lymphedema, not elsewhere classified: Secondary | ICD-10-CM | POA: Diagnosis not present

## 2015-05-22 NOTE — Patient Instructions (Signed)
LE instructions and precautions as established- see initial eval.   

## 2015-05-22 NOTE — Therapy (Signed)
[place] MAIN [facility] SERVICES [address] [place], [place], [address] Phone: [phone]   Fax:  [phone]  Occupational Therapy Treatment  Patient Details  Name: Terri Perez MRN: [id number] Date of Birth: [birth year] No Data Recorded  Encounter Date: 05/22/2015      OT End of Session - 05/22/15 1445    Visit Number 9   Number of Visits 36   Date for OT Re-Evaluation 07/28/15   OT Start Time 0205   OT Stop Time 0315   OT Time Calculation (min) 70 min   Equipment Utilized During Treatment [employer] samples, CircAid samples, Juzo A-G samples, Elvarex samples   Activity Tolerance Patient limited by pain;Patient tolerated treatment well;No increased pain;Patient limited by fatigue  Pt unable to participate in 1 hr eval due to painful legs and sitting intolerance.    Behavior During Therapy Flat affect      Past Medical History  Diagnosis Date  . Personal history of fibromyalgia 2002  . Unspecified constipation   . H/O cystitis 2011  . Special screening for malignant neoplasms, colon   . Arm fracture     right forearm, d/t fall  . Personal history of malignant neoplasm of large intestine   . Bowel trouble 2010  . Cancer [facility]) 12/2009    Left UOQ breast tumor; wide excision,sn bx and whole breast radiation. Chemotherapy done. There was only a 14m area of residual tumor remaining. All sn were negative. This was ER-positive, PR-negative, HER-2 neu not over expressed tumor. The original ultrasound size was 2.6 cm(T2).  . Malignant neoplasm of upper-outer quadrant of female breast ([facility] January 13, 2010    2+ centimeter tumor, neoadjuvant chemotherapy. On wide excision 3 mm area of residual tumor. Sentinel node negative. ER-30%, PR-negative, HER-2 not overexpressing.  . [place] cancer [place]    left, chemo and radiation    Past Surgical History  Procedure Laterality Date  . Colonoscopy  2010    Dr. WAllen Norris normal  .  Portacath placement  2011  . Port-a-cath removal  2013  . Breast surgery Left 2011    wide excision  . Breast biopsy Left 2011  . Breast cyst aspiration Left 1985  . Skin cancer excision  1980    face  . Tubal ligation  1973  . Abdominal hysterectomy  1974  . Back surgery  1985    bone spurs between 5&6, used bone from left hip  . [facility]  . Femur fracture surgery Right March 2016  . Joint replacement Bilateral Jan 2016 and March 2016    hip  . Cervical discectomy    . Upper gi endoscopy  08/20/07    gastritis without hemorrhage    There were no vitals filed for this visit.      Subjective Assessment - 05/22/15 1408    Subjective  Pt presents for OT visit 9 to address BLE lymphedema. Pt is accompanied by her spouse throughout session. Manufacturer's rep from [employer] here to assist w/ trial of sequential pneumatic compression device (Flexitouch).   Patient is accompained by: Family member   Pertinent History Hx fibromyalgia, Hx malignant large intestine neoplasm, colon resection 2014, abdominal hysterectomy 1974, cervical discectomy, 01/2014 B hip replacemet, 03/2014 R femur fx     Limitations difficulty walking, difficulty w/ all transfers and functional mobility, needs assistance with all basic and instrumental ADLs requiring standing, walking, extended sitting, lifting  legs, Pain limiting participation in social and cvommunity activities, ADL performance, and functional  performance of productive and leisure activities.   Repetition Increases Symptoms   Patient Stated Goals be able to move better, do more for myself, relieve pain   Currently in Pain? No/denies   Pain Onset 1 to 4 weeks ago                      OT Treatments/Exercises (OP) - 05/22/15 0001    ADLs   ADL Education Given Yes   Manual Therapy   Manual Therapy Edema management;Compression Bandaging;Manual Lymphatic Drainage (MLD);Other (comment)   Manual therapy  comments Completed anatomical BLE measurements for Flexitouch Trial: LLE:  B 29 cm  C: 44  E:  48 cm  G: 59 cm  RLE:  B:  26 cm  C:  43 cm  E:  46  G:  51 cm   Edema Management 60 min Flexitouch trial on RLE   Compression Bandaging No compression wraps to RLE today. Loaned Juzo ccl 2 sz 4 L Varin A-G for trial.   Other Manual Therapy skin care                OT Education - 05/22/15 1442    Education provided Yes   Education Details Pt and spouse education for lymphatic anatomy and pros and cons of sequential pneumatic compression devices for long term self management of lymphedema. Taught garment donning techniques using friction gloves and [employer]k sock.   Person(s) Educated Patient;Spouse   Methods Explanation;Demonstration;Handout   Comprehension Verbalized understanding;Need further instruction             OT Long Term Goals - 04/30/15 1237    OT LONG TERM GOAL #1   Title Pt able to apply thigh-length, multi layered, gradient compression wraps with maximum caregiver assistance within 2 weeks to achieve optimal limb volume reduction.   Baseline dependent   Time 2   Period Weeks   Status New   OT LONG TERM GOAL #2   Title Pt to achieve at least 10% BLE limb volume reductions during Intensive CDT to limit LE progression, decrease infection risk and fall risk, to reduce pain/ discomfort, and to improve safe ambulation and functional mobility.   Baseline dependent    Time 12   Period Weeks   Status New   OT LONG TERM GOAL #3   Title Pt >/= 85 % compliant with all daily, LE self-care protocols, including simple self-manual lymphatic drainage (MLD), skin care, lymphatic pumping the ex, skin care, and donning/ doffing compression wraps and garments with needed level of caregiver assistance to limit LE progression and further functional decline.     Baseline dependent    Time 12   Period Weeks   Status New   OT LONG TERM GOAL #4   Title During Management Phase CDT Pt to  sustain limb volume reductions achieved during Intensive Phase CDT within 5% utilizing LE self-care protocols, appropriate compression garments/ devices, and needed level of caregiver assistance.   Baseline dependent    Time 6   Period Months   Status New   OT LONG TERM GOAL #5   Baseline dependent    Status New               Plan - 05/22/15 1539    Clinical Impression Statement Pt tolerated Flexitouch trial without difficulty. Spouse able to assist w/ device garments with Max A and extra time.  Loaned ccl 2 thigh high appears to fit fery well. Pt instructed to wear today until ready to relax for the evening, and don frst thing in the morning for full time daily use tomorrow. Spouse will lightly wrap for HOS.    Rehab Potential Good   OT Frequency 3x / week   OT Duration 12 weeks   OT Treatment/Interventions Self-care/ADL training;DME and/or AE instruction;Manual lymph drainage;Patient/family education;Compression bandaging;Therapeutic exercises;Therapeutic activities;Manual Therapy   Consulted and Agree with Plan of Care Patient;Family member/caregiver      Patient will benefit from skilled therapeutic intervention in order to improve the following deficits and impairments:  Abnormal gait, Decreased cognition, Decreased knowledge of use of DME, Decreased skin integrity, Increased edema, Impaired flexibility, Decreased mobility, Decreased activity tolerance, Decreased endurance, Decreased range of motion, Decreased strength, Decreased balance, Decreased knowledge of precautions, Difficulty walking, Impaired perceived functional ability, Pain  Visit Diagnosis: Lymphedema, not elsewhere classified    Problem List Patient Active Problem List   Diagnosis Date Noted  . 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 Chronic kidney disease (CKD), stage III (moderate) 05/11/2015  . Elevated rheumatoid factor 05/11/2015  . Primary osteoarthritis of both hips 05/11/2015  . Episode of syncope  04/24/2015  . History of operative procedure on hip 04/24/2015  . Restless leg 04/24/2015  . Osteoporosis 03/30/2015  . Insomnia 03/30/2015  . Skin cancer 03/30/2015  . MDD (major depressive disorder) ([place]) 03/30/2015  . Migraine 03/30/2015  . GERD (gastroesophageal reflux disease) 03/30/2015  . IBS (irritable bowel syndrome) 03/30/2015  . OA (osteoarthritis) 03/30/2015  . Skin lesion of chest wall 07/17/2014  . Closed fracture of shaft of femur ([place]) 04/01/2014  . Fracture of bone adjacent to prosthesis ([place]) 04/01/2014  . Breast cancer ([place]) 03/07/2013  . Personal history of malignant neoplasm of large intestine   . 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 Acute anxiety 06/11/2008  . CONSTIPATION, CHRONIC 06/11/2008  . Fibromyalgia 06/11/2008   Andrey Spearman, MS, OTR/L, [facility] 05/22/2015 3:43 PM   [place] MAIN [facility] SERVICES [address] [place], [place], [address] Phone: [phone]   Fax:  [phone]  Name: DORIANN ZUCH MRN: [id number] Date of Birth: 04/26/1945

## 2015-05-25 ENCOUNTER — Ambulatory Visit: Payer: Medicare Other | Attending: Student | Admitting: Occupational Therapy

## 2015-05-25 DIAGNOSIS — I890 Lymphedema, not elsewhere classified: Secondary | ICD-10-CM | POA: Diagnosis not present

## 2015-05-25 DIAGNOSIS — R2689 Other abnormalities of gait and mobility: Secondary | ICD-10-CM | POA: Diagnosis not present

## 2015-05-25 DIAGNOSIS — R531 Weakness: Secondary | ICD-10-CM | POA: Insufficient documentation

## 2015-05-25 DIAGNOSIS — R262 Difficulty in walking, not elsewhere classified: Secondary | ICD-10-CM | POA: Diagnosis not present

## 2015-05-25 NOTE — Therapy (Signed)
[place] MAIN [facility] SERVICES [address] [place], [place], [address] Phone: [phone]   Fax:  [phone]  Occupational Therapy Treatment  Patient Details  Name: Terri Perez MRN: [id number] Date of Birth: 30-Apr-1945 No Data Recorded  Encounter Date: 05/25/2015      OT End of Session - 05/25/15 1702    Visit Number 10   Number of Visits 36   Date for OT Re-Evaluation 07/28/15   OT Start Time 0312   OT Stop Time 0415   OT Time Calculation (min) 63 min   Equipment Utilized During Treatment [employer] samples, CircAid samples, Juzo A-G samples, Elvarex samples   Activity Tolerance Patient limited by pain;Patient tolerated treatment well;No increased pain;Patient limited by fatigue  Pt unable to participate in 1 hr eval due to painful legs and sitting intolerance.    Behavior During Therapy Flat affect      Past Medical History  Diagnosis Date  . Personal history of fibromyalgia 2002  . Unspecified constipation   . H/O cystitis 2011  . Special screening for malignant neoplasms, colon   . Arm fracture     right forearm, d/t fall  . Personal history of malignant neoplasm of large intestine   . Bowel trouble 2010  . Cancer [facility]) 12/2009    Left UOQ breast tumor; wide excision,sn bx and whole breast radiation. Chemotherapy done. There was only a 23m area of residual tumor remaining. All sn were negative. This was ER-positive, PR-negative, HER-2 neu not over expressed tumor. The original ultrasound size was 2.6 cm(T2).  . 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 On wide excision 3 mm area of residual tumor. Sentinel node negative. ER-30%, PR-negative, HER-2 not overexpressing.  . Breast cancer ([facility] 12/2009    left, chemo and radiation    Past Surgical History  Procedure Laterality Date  . Colonoscopy  2010    Dr. WAllen Norris normal  .  Portacath placement  2011  . Port-a-cath removal  2013  . Breast surgery Left 2011    wide excision  . Breast biopsy Left 2011  . Breast cyst aspiration Left 1985  . Skin cancer excision  1980    face  . Tubal ligation  1973  . Abdominal hysterectomy  1974  . Back surgery  1985    bone spurs between 5&6, used bone from left hip  . [facility]  . Femur fracture surgery Right March 2016  . Joint replacement Bilateral Jan 2016 and March 2016    hip  . Cervical discectomy    . Upper gi endoscopy  08/20/07    gastritis without hemorrhage    There were no vitals filed for this visit.      Subjective Assessment - 05/25/15 1544    Subjective  Pt presents for OT visit 10 to address BLE lymphedema. Pt is accompanied by her spouse throughout session. Emphasis of session on review of progress towards goals, comparative RLE limb volumetrics and commencing MLD to LLE.   Patient is accompained by: Family member   Pertinent History Hx fibromyalgia, Hx malignant large intestine neoplasm, colon resection 2014, abdominal hysterectomy 1974, cervical discectomy, 01/2014 B hip replacemet, 03/2014 R femur fx     Limitations difficulty walking, difficulty w/ all transfers and functional mobility, needs assistance with all basic and instrumental ADLs requiring standing, walking, extended  sitting, lifting legs, Pain limiting participation in social and cvommunity activities, ADL performance, and functional  performance of productive and leisure activities.   Repetition Increases Symptoms   Patient Stated Goals be able to move better, do more for myself, relieve pain   Currently in Pain? No/denies   Pain Onset 1 to 4 weeks ago             LYMPHEDEMA/ONCOLOGY QUESTIONNAIRE - 05/25/15 1702    Right Lower Extremity Lymphedema   Other RLE below the knee (A-D) limb volume =  4341.88 cm.  Below knee limb volume reduction since initially measured equals 7.07%.   Other RLE Toes to  groin (A-G)  limb vol =13,375.40  ml; A-G limb volume reduction = 1.6%                 OT Treatments/Exercises (OP) - 05/25/15 0001    ADLs   ADL Education Given Yes   Manual Therapy   Manual Therapy Edema management;Compression Bandaging;Manual Lymphatic Drainage (MLD);Other (comment)   Manual therapy comments Completed RLE comparative limb volumetrics   Manual Lymphatic Drainage (MLD) Manual lymph drainage (MLD) to LLE in supine utilizing functional inguinal lymph nodes and deep abdominal lymphatics as is customary for non-cancer related lower extremity LE, including bilateral "short neck" sequence, J strokes to sub and supraclavicular LN, deep abdominal pathways, functional inguinal LN, lower extremity proximal to distal w/ emphasis on medial knee bottleneck and politeal LN. Performed fibrosis technique to B maleoli and distal  legs to address fatty fibrosis. Good tolerance.   Compression Bandaging Loaned ccl 2 A-G garment working well for patient for RLE. No compression wrap to LLE today by spouse request.   Other Manual Therapy skin care                OT Education - 05/25/15 1700    Education provided Yes   Education Details Continued skilled Pt/caregiver Education  And LE ADL training throughout visit for lymphedema self care, including compression wrapping, compression garment and device wear/care, lymphatic pumping ther ex, simple self-MLD, and skin care. Discussed progress towards goals.   Person(s) Educated Patient;Spouse   Methods Explanation   Comprehension Verbalized understanding;Need further instruction             OT Long Term Goals - 05/25/15 1545    OT LONG TERM GOAL #1   Title (p) Pt able to apply thigh-length, multi layered, gradient compression wraps with maximum caregiver assistance within 2 weeks to achieve optimal limb volume reduction.   Baseline (p) dependent   Time (p) 2   Period (p) Weeks   Status (p) Achieved   OT LONG TERM GOAL #2    Title (p) Pt to achieve at least 10% BLE limb volume reductions during Intensive CDT to limit LE progression, decrease infection risk and fall risk, to reduce pain/ discomfort, and to improve safe ambulation and functional mobility.   Baseline (p) dependent    Time (p) 12   Period (p) Weeks   Status (p) Partially Met   OT LONG TERM GOAL #3   Title (p) Pt >/= 85 % compliant with all daily, LE self-care protocols, including simple self-manual lymphatic drainage (MLD), skin care, lymphatic pumping the ex, skin care, and donning/ doffing compression wraps and garments with needed level of caregiver assistance to limit LE progression and further functional decline.     Baseline (p) dependent    Time (p) 12   Period (p) Weeks   Status (p)   Partially Met   OT LONG TERM GOAL #4   Title (p) During Management Phase CDT Pt to sustain limb volume reductions achieved during Intensive Phase CDT within 5% utilizing LE self-care protocols, appropriate compression garments/ devices, and needed level of caregiver assistance.   Baseline (p) dependent    Time (p) 6   Period (p) Months   Status (p) On-going   OT LONG TERM GOAL #5   Baseline (p) dependent    Status (p) New               Plan - 05/25/15 1704    Clinical Impression Statement RLE comparative limb volumetrics reveal 7.07% limb volume reduction below the R knee and full leg reduction from toes to groin measuring 1.6%. R leg femur pain continues to fluctuate based on activity level. Pt mobility continues to be very slow and performance for transfers continues to be dependent on overall functional mobility of the particular day. Spouse is profiscient w/ application of groin length compression and Pt is diligent w/ skin care and ther ex. Flexitouch pump has been ordered. CCL 2 thigh high Juzo garment appears to be containig RLE swelling pretty well at this stage. Commenced LLE CDT today.   Rehab Potential Good   OT Frequency 3x / week   OT  Duration 12 weeks   OT Treatment/Interventions Self-care/ADL training;DME and/or AE instruction;Manual lymph drainage;Patient/family education;Compression bandaging;Therapeutic exercises;Therapeutic activities;Manual Therapy   Consulted and Agree with Plan of Care Patient;Family member/caregiver      Patient will benefit from skilled therapeutic intervention in order to improve the following deficits and impairments:  Abnormal gait, Decreased cognition, Decreased knowledge of use of DME, Decreased skin integrity, Increased edema, Impaired flexibility, Decreased mobility, Decreased activity tolerance, Decreased endurance, Decreased range of motion, Decreased strength, Decreased balance, Decreased knowledge of precautions, Difficulty walking, Impaired perceived functional ability, Pain  Visit Diagnosis: Lymphedema, not elsewhere classified      G-Codes - 05/25/15 1701    Functional Limitation Self care   Self Care Current Status (G8987) At least 80 percent but less than 100 percent impaired, limited or restricted   Self Care Goal Status (G8988) At least 60 percent but less than 80 percent impaired, limited or restricted      Problem List Patient Active Problem List   Diagnosis Date Noted  . 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 Chronic kidney disease (CKD), stage III (moderate) 05/11/2015  . Elevated rheumatoid factor 05/11/2015  . Primary osteoarthritis of both hips 05/11/2015  . Episode of syncope 04/24/2015  . History of operative procedure on hip 04/24/2015  . Restless leg 04/24/2015  . Osteoporosis 03/30/2015  . Insomnia 03/30/2015  . Skin cancer 03/30/2015  . MDD (major depressive disorder) (HCC) 03/30/2015  . Migraine 03/30/2015  . GERD (gastroesophageal reflux disease) 03/30/2015  . IBS (irritable bowel syndrome) 03/30/2015  . OA (osteoarthritis) 03/30/2015  . Skin lesion of chest wall 07/17/2014  . Closed fracture of shaft of femur (HCC) 04/01/2014  . Fracture of bone  adjacent to prosthesis (HCC) 04/01/2014  . Breast cancer (HCC) 03/07/2013  . Personal history of malignant neoplasm of large intestine   . 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 Acute anxiety 06/11/2008  . CONSTIPATION, CHRONIC 06/11/2008  . Fibromyalgia 06/11/2008    , MS, OTR/L, CLT-LANA 05/25/2015 5:15 PM  [facility] MAIN REHAB SERVICES [address] [place], [place], [address] Phone: [phone]   Fax:  [phone]  Name:   Terri Perez MRN: [id number] Date of Birth: December 14, 1945

## 2015-05-25 NOTE — Patient Instructions (Signed)
LE instructions and precautions as established- see initial eval.   

## 2015-05-27 ENCOUNTER — Ambulatory Visit: Payer: Medicare Other | Admitting: Occupational Therapy

## 2015-05-27 DIAGNOSIS — R262 Difficulty in walking, not elsewhere classified: Secondary | ICD-10-CM | POA: Diagnosis not present

## 2015-05-27 DIAGNOSIS — R531 Weakness: Secondary | ICD-10-CM | POA: Diagnosis not present

## 2015-05-27 DIAGNOSIS — R2689 Other abnormalities of gait and mobility: Secondary | ICD-10-CM | POA: Diagnosis not present

## 2015-05-27 DIAGNOSIS — I890 Lymphedema, not elsewhere classified: Secondary | ICD-10-CM

## 2015-05-27 NOTE — Therapy (Signed)
[place] MAIN [facility] SERVICES [address] [place], [place], [address] Phone: [phone]   Fax:  [phone]  Occupational Therapy Treatment  Patient Details  Name: Terri Perez MRN: [id number] Date of Birth: 06/11/1945 No Data Recorded  Encounter Date: 05/27/2015      OT End of Session - 05/27/15 1516    Visit Number 11   Number of Visits 36   Date for OT Re-Evaluation 07/28/15   OT Start Time 0213   OT Stop Time 0313   OT Time Calculation (min) 60 min   Equipment Utilized During Treatment [employer] samples, CircAid samples, Juzo A-G samples, Elvarex samples   Activity Tolerance Patient limited by pain;Patient tolerated treatment well;No increased pain;Patient limited by fatigue  Pt unable to participate in 1 hr eval due to painful legs and sitting intolerance.    Behavior During Therapy Flat affect      Past Medical History  Diagnosis Date  . Personal history of fibromyalgia 2002  . Unspecified constipation   . H/O cystitis 2011  . Special screening for malignant neoplasms, colon   . Arm fracture     right forearm, d/t fall  . Personal history of malignant neoplasm of large intestine   . Bowel trouble 2010  . Cancer [facility]) 12/2009    Left UOQ breast tumor; wide excision,sn bx and whole breast radiation. Chemotherapy done. There was only a 40m area of residual tumor remaining. All sn were negative. This was ER-positive, PR-negative, HER-2 neu not over expressed tumor. The original ultrasound size was 2.6 cm(T2).  . 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 On wide excision 3 mm area of residual tumor. Sentinel node negative. ER-30%, PR-negative, HER-2 not overexpressing.  . Breast cancer ([facility] 12[place]    left, chemo and radiation    Past Surgical History  Procedure Laterality Date  . Colonoscopy  2010    Dr. WAllen Norris normal  .  Portacath placement  2011  . Port-a-cath removal  2013  . Breast surgery Left 2011    wide excision  . Breast biopsy Left 2011  . Breast cyst aspiration Left 1985  . Skin cancer excision  1980    face  . Tubal ligation  1973  . Abdominal hysterectomy  1974  . Back surgery  1985    bone spurs between 5&6, used bone from left hip  . [facility]  . Femur fracture surgery Right March 2016  . Joint replacement Bilateral Jan 2016 and March 2016    hip  . Cervical discectomy    . Upper gi endoscopy  08/20/07    gastritis without hemorrhage    There were no vitals filed for this visit.      Subjective Assessment - 05/27/15 1415    Subjective  Pt presents for OT visit 11 to address BLE lymphedema. Pt is accompanied by her spouse. Pt has ongoing    Patient is accompained by: Family member   Pertinent History Hx fibromyalgia, Hx malignant large intestine neoplasm, colon resection 2014, abdominal hysterectomy 1974, cervical discectomy, 01/2014 B hip replacemet, 03/2014 R femur fx     Limitations difficulty walking, difficulty w/ all transfers and functional mobility, needs assistance with all basic and instrumental ADLs requiring standing, walking, extended sitting, lifting legs, Pain limiting participation in social and cvommunity activities, ADL performance, and functional  performance of productive and leisure activities.   Repetition Increases Symptoms   Patient Stated Goals be able to move better, do more for myself, relieve pain   Currently in Pain? Yes   Pain Score --  not rated numerically   Pain Location Hip   Pain Orientation Right   Pain Descriptors / Indicators --  stiffness   Pain Type Chronic pain   Pain Onset 1 to 4 weeks ago                      OT Treatments/Exercises (OP) - 05/27/15 0001    ADLs   ADL Education Given Yes   Manual Therapy   Manual Therapy Edema management;Compression Bandaging;Manual Lymphatic Drainage  (MLD);Other (comment)   Manual therapy comments Completed anatomical measurements for LLE to determine garment size. G: 72 cm   C: 46. 2 cm  B: 29.5    Manual Lymphatic Drainage (MLD) Manual lymph drainage (MLD) to LLE in supine utilizing functional inguinal lymph nodes and deep abdominal lymphatics as is customary for non-cancer related lower extremity LE, including bilateral "short neck" sequence, J strokes to sub and supraclavicular LN, deep abdominal pathways, functional inguinal LN, lower extremity proximal to distal w/ emphasis on medial knee bottleneck and politeal LN. Performed fibrosis technique to B maleoli and distal  legs to address fatty fibrosis. Good tolerance.   Compression Bandaging Loaned ccl 2 A-G garment working well for patient for RLE. No compression wrap to LLE today by spouse request.   Other Manual Therapy skin care                OT Education - 05/27/15 1514    Education provided Yes   Education Details Reviewed lymphedema precautions related to DC pump if signs/ symptoms of infection and/ or clot. Reviewed proper compression garment wear and care regimes. Reviewed POC and progress towards goals   Person(s) Educated Patient;Spouse   Methods Explanation   Comprehension Verbalized understanding             OT Long Term Goals - 05/25/15 1545    OT LONG TERM GOAL #1   Title (p) Pt able to apply thigh-length, multi layered, gradient compression wraps with maximum caregiver assistance within 2 weeks to achieve optimal limb volume reduction.   Baseline (p) dependent   Time (p) 2   Period (p) Weeks   Status (p) Achieved   OT LONG TERM GOAL #2   Title (p) Pt to achieve at least 10% BLE limb volume reductions during Intensive CDT to limit LE progression, decrease infection risk and fall risk, to reduce pain/ discomfort, and to improve safe ambulation and functional mobility.   Baseline (p) dependent    Time (p) 12   Period (p) Weeks   Status (p) Partially Met    OT LONG TERM GOAL #3   Title (p) Pt >/= 85 % compliant with all daily, LE self-care protocols, including simple self-manual lymphatic drainage (MLD), skin care, lymphatic pumping the ex, skin care, and donning/ doffing compression wraps and garments with needed level of caregiver assistance to limit LE progression and further functional decline.     Baseline (p) dependent    Time (p) 12   Period (p) Weeks   Status (p) Partially Met   OT LONG TERM GOAL #4   Title (p) During Management Phase CDT Pt to sustain limb volume reductions achieved during Intensive Phase CDT within 5% utilizing LE self-care protocols, appropriate compression garments/ devices,  and needed level of caregiver assistance.   Baseline (p) dependent    Time (p) 6   Period (p) Months   Status (p) On-going   OT LONG TERM GOAL #5   Baseline (p) dependent    Status (p) New               Plan - 05/27/15 1516    Clinical Impression Statement Pt's spouse expressing need to take a break before commencing compression wrap and CDT to LLE. Modified POC as follows: 1- Pt able to fit into loaned ccl 2 Juzo Varin stocking on LLE. Pt will utilize that daily for the next few days. If garment is uncomfortable, she will keep this Friday's appointment. If all is well, we'll see Pt on Monday and assist w/ completing garment order on line. Pt will resume PT in 3 weeks.   Rehab Potential Good   OT Frequency 3x / week   OT Duration 12 weeks   OT Treatment/Interventions Self-care/ADL training;DME and/or AE instruction;Manual lymph drainage;Patient/family education;Compression bandaging;Therapeutic exercises;Therapeutic activities;Manual Therapy   Consulted and Agree with Plan of Care Patient;Family member/caregiver      Patient will benefit from skilled therapeutic intervention in order to improve the following deficits and impairments:  Abnormal gait, Decreased cognition, Decreased knowledge of use of DME, Decreased skin integrity,  Increased edema, Impaired flexibility, Decreased mobility, Decreased activity tolerance, Decreased endurance, Decreased range of motion, Decreased strength, Decreased balance, Decreased knowledge of precautions, Difficulty walking, Impaired perceived functional ability, Pain  Visit Diagnosis: Lymphedema, not elsewhere classified    Problem List Patient Active Problem List   Diagnosis Date Noted  . 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 Chronic kidney disease (CKD), stage III (moderate) 05/11/2015  . Elevated rheumatoid factor 05/11/2015  . Primary osteoarthritis of both hips 05/11/2015  . Episode of syncope 04/24/2015  . History of operative procedure on hip 04/24/2015  . Restless leg 04/24/2015  . Osteoporosis 03/30/2015  . Insomnia 03/30/2015  . Skin cancer 03/30/2015  . MDD (major depressive disorder) ([place]) 03/30/2015  . Migraine 03/30/2015  . GERD (gastroesophageal reflux disease) 03/30/2015  . IBS (irritable bowel syndrome) 03/30/2015  . OA (osteoarthritis) 03/30/2015  . Skin lesion of chest wall 07/17/2014  . Closed fracture of shaft of femur ([place]) 04/01/2014  . Fracture of bone adjacent to prosthesis ([place]) 04/01/2014  . Breast cancer ([place]) 03/07/2013  . Personal history of malignant neoplasm of large intestine   . 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 Acute anxiety 06/11/2008  . CONSTIPATION, CHRONIC 06/11/2008  . Fibromyalgia 06/11/2008    Andrey Spearman, MS, OTR/L, CLT-LANA 05/27/2015 3:20 PM  [place] MAIN [facility] SERVICES [address] [place], [place], [address] Phone: [phone]   Fax:  [phone]  Name: DALAYLA ALDREDGE MRN: [id number] Date of Birth: Jul 18, 1945

## 2015-05-27 NOTE — Patient Instructions (Signed)
LE instructions and precautions as established- see initial eval.   

## 2015-05-29 ENCOUNTER — Ambulatory Visit: Payer: Medicare Other | Admitting: Occupational Therapy

## 2015-06-01 ENCOUNTER — Encounter: Payer: Medicare Other | Admitting: Occupational Therapy

## 2015-06-03 ENCOUNTER — Encounter: Payer: Medicare Other | Admitting: Occupational Therapy

## 2015-06-05 ENCOUNTER — Encounter: Payer: Medicare Other | Admitting: Occupational Therapy

## 2015-06-08 ENCOUNTER — Encounter: Payer: Medicare Other | Admitting: Occupational Therapy

## 2015-06-10 ENCOUNTER — Encounter: Payer: Medicare Other | Admitting: Occupational Therapy

## 2015-06-12 ENCOUNTER — Encounter: Payer: Medicare Other | Admitting: Occupational Therapy

## 2015-06-15 ENCOUNTER — Encounter: Payer: Medicare Other | Admitting: Occupational Therapy

## 2015-06-17 ENCOUNTER — Encounter: Payer: Medicare Other | Admitting: Occupational Therapy

## 2015-06-19 ENCOUNTER — Telehealth: Payer: Self-pay

## 2015-06-19 ENCOUNTER — Telehealth: Payer: Self-pay | Admitting: Physical Therapy

## 2015-06-19 ENCOUNTER — Ambulatory Visit: Payer: Medicare Other | Admitting: Physical Therapy

## 2015-06-19 ENCOUNTER — Encounter: Payer: Medicare Other | Admitting: Occupational Therapy

## 2015-06-19 DIAGNOSIS — R531 Weakness: Secondary | ICD-10-CM

## 2015-06-19 DIAGNOSIS — R262 Difficulty in walking, not elsewhere classified: Secondary | ICD-10-CM

## 2015-06-19 DIAGNOSIS — I890 Lymphedema, not elsewhere classified: Secondary | ICD-10-CM | POA: Diagnosis not present

## 2015-06-19 DIAGNOSIS — R2689 Other abnormalities of gait and mobility: Secondary | ICD-10-CM | POA: Diagnosis not present

## 2015-06-19 NOTE — Telephone Encounter (Signed)
Physical therapist called [facility], and wanted to make sure we followed up on her sleep study and getting that done. O see a form scanned in where we faxed it in April but patient has not heard anything she states. Judson Roch can you look into this please-aa

## 2015-06-19 NOTE — Patient Instructions (Signed)
Sit to stand cues:  Nose over toes, inhale smell the roses, exhale to stand   Heel slides seated with towel to increase knee flexion 10 reps every hour seated in chair

## 2015-06-19 NOTE — Therapy (Signed)
[place] MAIN [facility] SERVICES [address] [place], [place], [address] Phone: [phone]   Fax:  [phone]  Physical Therapy Evaluation  Patient Details  Name: Terri Perez MRN: [id number] Date of Birth: [birth year] No Data Recorded  Encounter Date: 06/19/2015      PT End of Session - 06/19/15 1858    Visit Number 1   Number of Visits 12   Date for PT Re-Evaluation 09/04/15   Authorization Type 1 of 1/10 g code   PT Start Time 0903   PT Stop Time 0955   PT Time Calculation (min) 52 min   Behavior During Therapy Flat affect      Past Medical History  Diagnosis Date  . Personal history of fibromyalgia 2002  . Unspecified constipation   . H/O cystitis 2011  . Special screening for malignant neoplasms, colon   . Arm fracture     right forearm, d/t fall  . Personal history of malignant neoplasm of large intestine   . Bowel trouble 2010  . Cancer [facility] 12/2009    Left UOQ breast tumor; wide excision,sn bx and whole breast radiation. Chemotherapy done. There was only a 33m area of residual tumor remaining. All sn were negative. This was ER-positive, PR-negative, HER-2 neu not over expressed tumor. The original ultrasound size was 2.6 cm(T2).  . Malignant neoplasm of upper-outer quadrant of female breast ([facility] January 13, 2010    2+ centimeter tumor, neoadjuvant chemotherapy. On wide excision 3 mm area of residual tumor. Sentinel node negative. ER-30%, PR-negative, HER-2 not overexpressing.  . [place]    left, chemo and radiation    Past Surgical History  Procedure Laterality Date  . Colonoscopy  2010    Dr. WAllen Norris normal  . Portacath placement  2011  . Port-a-cath removal  2013  . Breast surgery Left 2011    wide excision  . Breast biopsy Left 2011  . Breast cyst aspiration Left 1985  . Skin cancer excision  1980    face  . Tubal ligation  1973  . Abdominal hysterectomy  1974  . Back surgery  1985     bone spurs between 5&6, used bone from left hip  . [facility]  . Femur fracture surgery Right March 2016  . Joint replacement Bilateral Jan 2016 and March 2016    hip  . Cervical discectomy    . Upper gi endoscopy  08/20/07    gastritis without hemorrhage    There were no vitals filed for this visit.       Subjective Assessment - 06/19/15 0916    Subjective Pt would like to return to walking with walker. Pt has been limited due to multiple medical conditions.  In 2011, pt had breast CA and completed chemo, surgery, and radiation. Pt had her colon removed in 2015.  Pt sustained 2 falls over the past 2 years and underwent ORIF for the Fx of femur and also underwent THA on the R. Pt completed PT following these surgeries and found aquatic Tx to help her improve her walking by Dec 2016. After being discharged from PT, pt achieved walking with a walker around the house with "better rhythm", taking showers with assistance. After being discharged, pt tried to keep up with the exercises her PT gave her but she felt sore and tired without aquatic Tx.  Since 12/2014, pt had experienced lymphedema. Pt has difficulty  sleeping due mm spasms and feeling stiff.   Pt tried do range of motions exercises but not "consistently" because it "would be painful". Pt "would make the situation worse by resting". Pt stated "she would feel much better with teh stretching but didn ot know how much to do to not over do it."   Pt sleeps in her lift chair due to frequent toileting trips 4-5x. Pt was recommended to use a CPAP in Jan 2017 but pt and husband has not heard back from her PCP/ sleep study.        Patient Stated Goals 1) to be able to t/f to bed and to turn on her side to sleep instead of sleeping in her lift chair              [facility] PT Assessment - 06/19/15 1853    Observation/Other Assessments   Other Surveys  --  LEFS 12%   Sit to Stand   Comments required 2 trials to rise  with BUE on arm rest, IND    AROM   Overall AROM Comments knee flexion WFL, pt required education on using towel to pull heel back on her own instead of asking her husband to help her   Ambulation/Gait   Assistive device Rolling walker   Gait velocity 150 ft 30mn 12 sec                   [facility] Adult PT Treatment/Exercise - 06/19/15 1853    Therapeutic Activites    Other Therapeutic Activities educated about getting sleep study update , importance of using CPAP if positive for OSA in order to minmize nocturia, motivation interviewing and discussed goals   Neuro Re-ed    Neuro Re-ed Details  exhalation on rise in sit to stand  alignment cues for sit to stand   Exercises   Other Exercises  5 reps with AAROM with towel on knee flexion on R                 PT Education - 06/19/15 1858    Education provided Yes   Education Details POC, goals, motivational interviewing,   Person(s) Educated Patient;Spouse   Methods Explanation;Demonstration;Tactile cues;Verbal cues;Handout   Comprehension Returned demonstration;Verbalized understanding          PT Short Term Goals - 05/04/15 2229    PT SHORT TERM GOAL #1   Title Pt will be referred to a lymphemda specialist to address BLE swelling and limited ROM.   Time 1   Period Days   Status Achieved           PT Long Term Goals - 06/19/15 1913    PT LONG TERM GOAL #1   Title Pt will demo increased walking endurance from 3 in (120 ft)  to 6 min before needing to sit down in order to increase aerobic activity and ROM for management of medical conditions.   Time 12   Period Weeks   Status New   PT LONG TERM GOAL #2   Title Pt will demo IND with HEP in order to maintain functional mobility at home and in the community with decreased help from her husband.   Time 12   Period Weeks   Status New   PT LONG TERM GOAL #3   Title Pt will demo with bed mobility (supine to roling L/R ) in order to return to sleeping on her  bed for improved quality of sleep   Time 12  Period Weeks   Status New               Plan - 07/18/15 1900    Clinical Impression Statement Pt is a 72 female who c/o LE stiffness and poor sleep. Pt's co morbidities and surgeries contribute to her limited mobility.   Pt expresses needing to understand how to perform her HEP without "overdoing it" and causing increased mm spasm/ stiffness. Pt found aquatic Tx to have helped her in the past. Pt currently sleeps in her lift chair because she has to urinate multiple times a night. Pt has not f/u with her recent sleep study results because she and her husband state they have not heard back from the clinic. PT suspects pt would benefit from the use of CPAP if pt is positive for OSA as research shows nocturia will improve as OSA if better managed. Pt may also benefit from pelvic health education and deep core mm training to address frequent urination. Additionally, pt would benefit from core strengthening through aquatic therapy  to improve bed mobility and to return to sleeping in her bed instead of her lift chair. Today, pt was able to walk 3 min (150 ft)  with RW before feeling fatigued. Pt showed increased LE ROM and IND with sit to stand t/f compared to her last visit to PT 2 months ago when PT referred pt to lymphedema therapy.    Pt will benefit from skilled PT to maintain functional mobility and aerobic endurance 2/2 chronic conditions and surgeries ( post CA Tx, THA, femur ORIF, fibromyalgia).  tient will benefit from skilled therapeutic intervention in order to improve the following deficits and impairments:  Difficulty walking, Decreased endurance, Pain, Decreased strength, Impaired flexibility    Rehab Potential Good   PT Frequency 2x / week   PT Duration 6 weeks   PT Treatment/Interventions Patient/family education;Therapeutic exercise;Manual techniques;Moist Heat;Cryotherapy;Aquatic Therapy   PT Next Visit Plan aquatic therapy and manual  therapy to help with spasms, and pain, therapeutic exercise for strength and endurance      Patient will benefit from skilled therapeutic intervention in order to improve the following deficits and impairments:  Difficulty walking, Decreased endurance, Pain, Decreased strength, Impaired flexibility  Visit Diagnosis: Weakness generalized  Difficulty walking  Other abnormalities of gait and mobility      G-Codes - 07-18-2015 1916    Functional Assessment Tool Used gait speed , clinical judgement LEFS 12%   Functional Limitation Mobility: Walking and moving around   Mobility: Walking and Moving Around Current Status ([id number]) At least 60 percent but less than 80 percent impaired, limited or restricted   Mobility: Walking and Moving Around Goal Status [phone]) At least 40 percent but less than 60 percent impaired, limited or restricted       Problem List Patient Active Problem List   Diagnosis Date Noted  . 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 Chronic kidney disease (CKD), stage III (moderate) 05/11/2015  . Elevated rheumatoid factor 05/11/2015  . Primary osteoarthritis of both hips 05/11/2015  . Episode of syncope 04/24/2015  . History of operative procedure on hip 04/24/2015  . Restless leg 04/24/2015  . Osteoporosis 03/30/2015  . Insomnia 03/30/2015  . Skin cancer 03/30/2015  . MDD (major depressive disorder) ([place]) 03/30/2015  . Migraine 03/30/2015  . GERD (gastroesophageal reflux disease) 03/30/2015  . IBS (irritable bowel syndrome) 03/30/2015  . OA (osteoarthritis) 03/30/2015  . Skin lesion of chest wall 07/17/2014  . Closed fracture of shaft of femur (  [place]) 04/01/2014  . Fracture of bone adjacent to prosthesis ([place]) 04/01/2014  . Breast cancer ([place]) 03/07/2013  . Personal history of malignant neoplasm of large intestine   . Malignant neoplasm of upper-outer quadrant of female breast ([place])   . Acute anxiety 06/11/2008  . CONSTIPATION, CHRONIC 06/11/2008  . Fibromyalgia  06/11/2008    Jerl Mina ,PT, DPT, E-RYT  06/19/2015, 7:17 PM  [place] MAIN [facility] SERVICES [address] [place], [place], [address] Phone: [phone]   Fax:  [phone]  Name: Terri Perez MRN: [id number] Date of Birth: November 24, 1945

## 2015-06-19 NOTE — Telephone Encounter (Signed)
PT left a message at [facility] on a voicemail re: Pt needing f/u about her sleep study and CPAP prescription because pt and husband reported they had not heard back from the sleep study clinic re: sleep study results or CPAP prescription. PT stated pt's prognosis (nighttime nocturia and sleep quality) will improve with management of OSA if pt is positive w/ this Dx.

## 2015-06-23 ENCOUNTER — Telehealth: Payer: Self-pay | Admitting: Physical Therapy

## 2015-06-23 ENCOUNTER — Ambulatory Visit: Payer: Medicare Other | Admitting: Physical Therapy

## 2015-06-23 ENCOUNTER — Telehealth: Payer: Self-pay | Admitting: Family Medicine

## 2015-06-23 DIAGNOSIS — R262 Difficulty in walking, not elsewhere classified: Secondary | ICD-10-CM | POA: Diagnosis not present

## 2015-06-23 DIAGNOSIS — R2689 Other abnormalities of gait and mobility: Secondary | ICD-10-CM | POA: Diagnosis not present

## 2015-06-23 DIAGNOSIS — R531 Weakness: Secondary | ICD-10-CM | POA: Diagnosis not present

## 2015-06-23 DIAGNOSIS — I890 Lymphedema, not elsewhere classified: Secondary | ICD-10-CM | POA: Diagnosis not present

## 2015-06-23 DIAGNOSIS — G4733 Obstructive sleep apnea (adult) (pediatric): Secondary | ICD-10-CM

## 2015-06-23 NOTE — Telephone Encounter (Signed)
Dr. Annamaria Boots calling  stating the pt needs a referral for [facility] to get a auto cpap. The contact person at the [facility] is Anderson Malta and the Fax # is [phone] & phone # [phone]. Thank you CC

## 2015-06-23 NOTE — Therapy (Signed)
[place] MAIN [facility] SERVICES [address] [place], [place], [address] Phone: [phone]   Fax:  [phone]  Physical Therapy Treatment  Patient Details  Name: Terri Perez MRN: [id number] Date of Birth: 28-Jun-1945 No Data Recorded  Encounter Date: 06/23/2015    Past Medical History  Diagnosis Date  . Personal history of fibromyalgia 2002  . Unspecified constipation   . H/O cystitis 2011  . Special screening for malignant neoplasms, colon   . Arm fracture     right forearm, d/t fall  . Personal history of malignant neoplasm of large intestine   . Bowel trouble 2010  . Cancer [facility]) 12/2009    Left UOQ breast tumor; wide excision,sn bx and whole breast radiation. Chemotherapy done. There was only a 4m area of residual tumor remaining. All sn were negative. This was ER-positive, PR-negative, HER-2 neu not over expressed tumor. The original ultrasound size was 2.6 cm(T2).  . Malignant neoplasm of upper-outer quadrant of female breast ([facility] January 13, 2010    2+ centimeter tumor, neoadjuvant chemotherapy. On wide excision 3 mm area of residual tumor. Sentinel node negative. ER-30%, PR-negative, HER-2 not overexpressing.  . [place]    left, chemo and radiation    Past Surgical History  Procedure Laterality Date  . Colonoscopy  2010    Dr. WAllen Norris normal  . Portacath placement  2011  . Port-a-cath removal  2013  . Breast surgery Left 2011    wide excision  . Breast biopsy Left 2011  . Breast cyst aspiration Left 1985  . Skin cancer excision  1980    face  . Tubal ligation  1973  . Abdominal hysterectomy  1974  . Back surgery  1985    bone spurs between 5&6, used bone from left hip  . [facility]  . Femur fracture surgery Right March 2016  . Joint replacement Bilateral Jan 2016 and March 2016    hip  . Cervical discectomy    . Upper gi endoscopy  08/20/07    gastritis without  hemorrhage    There were no vitals filed for this visit.                   Adult Aquatic Therapy - 06/24/15 1842    Aquatic Therapy Subjective   Subjective Pt experienced dizziness after walking into the pool via ramp with BUE on rail and walking with BUE on noodles in 3'6" depth 100 ft.             [facility] Treatment/Exercise - 06/24/15 1846    Ambulation/Gait   Gait Comments initial posterior COM with minor LOB with BUE on noodles and PT providing SBA in 3'6" pool depth       O: O: Pt entered/exited sidestepping the pool via ramp with BUE support on rail. Exercises performed in 3'6" depth  50 ft =1 lap  Standing hip ext with single UE on wall 10 reps bilaterally Walking with CGA using BUE on two noodles parallel to body 2 laps            PT Long Term Goals - 06/19/15 1913    PT LONG TERM GOAL #1   Title Pt will demo increased walking endurance from 3 in (120 ft)  to 6 min before needing to sit down in order to increase aerobic activity and ROM for management of medical conditions.  Time 12   Period Weeks   Status New   PT LONG TERM GOAL #2   Title Pt will demo IND with HEP in order to maintain functional mobility at home and in the community with decreased help from her husband.   Time 12   Period Weeks   Status New   PT LONG TERM GOAL #3   Title Pt will demo with bed mobility (supine to roling L/R ) in order to return to sleeping on her bed for improved quality of sleep   Time 12   Period Weeks   Status New               Plan - 06/24/15 1849    Clinical Impression Statement Pt 's pool session was abbreviated due to pt's report of dizziness. Pt was able to walk into of pool via ramp (BUE on ramp) sidestepping with SBA and 100 ft in 3'6" pool depth (BUE on two noodles)  with CGA before pt reported dizziness like she was going to faint. PT assisted pt to sit at pool bench with close monitoring. Pt remained alert and communicative. Pt  was able to walk out of the pool via ramp with PT providing SBA and remained seated for 5 min before heading into shower room with husband. After leaving the locker room, pt reported feeling dizziness again when seated on her rollator. BP was taken ( 120/82 mm Hg) and PT provided a cup of water. Pt stated she was ready to return home and husband pushed pt in rollator to their car. PT facilitated a telephone correspondence with respiratory therapist at [facility] and pt's PCP office to help pt and husband understand the status of her prior sleep study a few months ago. Suspect pt's poor sleep and pt's low endurance are contributing factors to pt's report of dizziness. PT educated pt to r/s  PT sessions in the future if pt has not had enough sleep because energy is required for exercise. Pt may be a good candidate for home PT to address bed mobility to improve sleep in her bed instead of her lift chair and to also learn low grade exercises for maintaining functional mobility. Suspect pt s management of sleep apnea will also help improve her sleep quality. PT will remain in correspondence with pt and her healthcare providers.    Rehab Potential Good   PT Frequency 2x / week   PT Duration 6 weeks   PT Treatment/Interventions Patient/family education;Therapeutic exercise;Manual techniques;Moist Heat;Cryotherapy;Aquatic Therapy   PT Next Visit Plan aquatic therapy and manual therapy to help with spasms, and pain, therapeutic exercise for strength and endurance      Patient will benefit from skilled therapeutic intervention in order to improve the following deficits and impairments:  Difficulty walking, Decreased endurance, Pain, Decreased strength, Impaired flexibility  Visit Diagnosis: Weakness generalized  Difficulty walking  Other abnormalities of gait and mobility     Problem List Patient Active Problem List   Diagnosis Date Noted  . 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 Chronic kidney  disease (CKD), stage III (moderate) 05/11/2015  . Elevated rheumatoid factor 05/11/2015  . Primary osteoarthritis of both hips 05/11/2015  . Episode of syncope 04/24/2015  . History of operative procedure on hip 04/24/2015  . Restless leg 04/24/2015  . Osteoporosis 03/30/2015  . Insomnia 03/30/2015  . Skin cancer 03/30/2015  . MDD (major depressive disorder) ([place]) 03/30/2015  . Migraine 03/30/2015  . GERD (gastroesophageal reflux disease) 03/30/2015  . IBS (  irritable bowel syndrome) 03/30/2015  . OA (osteoarthritis) 03/30/2015  . Skin lesion of chest wall 07/17/2014  . Closed fracture of shaft of femur ([place]) 04/01/2014  . Fracture of bone adjacent to prosthesis ([place]) 04/01/2014  . Breast cancer ([place]) 03/07/2013  . Personal history of malignant neoplasm of large intestine   . Malignant neoplasm of upper-outer quadrant of female breast ([place])   . Acute anxiety 06/11/2008  . CONSTIPATION, CHRONIC 06/11/2008  . Fibromyalgia 06/11/2008    Jerl Mina ,PT, DPT, E-RYT  06/24/2015, 6:52 PM  [place] MAIN [facility] SERVICES [address] [place], [place], [address] Phone: [phone]   Fax:  [phone]  Name: Terri Perez MRN: [id number] Date of Birth: [birth year]

## 2015-06-23 NOTE — Telephone Encounter (Signed)
Please review-aa 

## 2015-06-23 NOTE — Telephone Encounter (Signed)
Ok to order Auto CPAP thx

## 2015-06-23 NOTE — Telephone Encounter (Signed)
Order put in, please schedule-aa

## 2015-06-23 NOTE — Telephone Encounter (Signed)
Per Ria Comment at [facility] pt was supposed to come back in for repeat study with CPAP but husband refused it.She states order can be written for an auto titration study done at home

## 2015-06-23 NOTE — Telephone Encounter (Signed)
PT called spoke with Ria Comment and Anderson Malta re: pt's status on sleep study/ CPAP. [name] was put on the phone with Anderson Malta and pt was asked if she would like to return to do a repeat sleep study at the clinic and pt declined due to inconvenience and "having to get up multiple times to use the bathroom.".  Pt and husband decided with the option for an auto CPAP which will require an MD order. PT called [facility] and left a message with RN informing about the referral for auto CPAP and provided fax # [phone].

## 2015-06-24 ENCOUNTER — Encounter: Payer: Medicare Other | Admitting: Occupational Therapy

## 2015-06-24 NOTE — Telephone Encounter (Signed)
Do you want auto titration study or auto CPAP machine

## 2015-06-24 NOTE — Telephone Encounter (Signed)
I think auto CPAP machine

## 2015-06-24 NOTE — Telephone Encounter (Signed)
Please review-aa 

## 2015-06-25 ENCOUNTER — Ambulatory Visit: Payer: Medicare Other | Admitting: Physical Therapy

## 2015-06-26 ENCOUNTER — Telehealth: Payer: Self-pay | Admitting: Physical Therapy

## 2015-06-26 ENCOUNTER — Encounter: Payer: Medicare Other | Admitting: Occupational Therapy

## 2015-06-26 NOTE — Telephone Encounter (Signed)
Order for Auto titrating CPAP machine faxed to [facility].Their office will contact

## 2015-06-26 NOTE — Telephone Encounter (Signed)
PT was notified by front desk staff on 06/24/15 that pt's husband stopped by the clinic to cancel future appointments because pt feels overwhelmed and that aquatic therapy was too much for her right now .  PT  phoned pt and spoke with pt's husband on 06/26/15 informing him that home PT may be a better option for the pt because the PT can come to their house and show her exercises to help her maintain the mobility she has regained from lymphedema therapy, improve her bed mobility, and to help pt find comfort with laying in her bed instead of her lift chair. [name] stated he will speak with pt . If pt was interested, PT explained that she would make this recommendation to her  doctor who would in turn make the referral for home PT. PT also informed pt's husband that PT had f/u with [facility] and their MD to ensure their sleep study was being followed up.  [name] voiced understanding.

## 2015-06-29 ENCOUNTER — Encounter: Payer: Medicare Other | Admitting: Occupational Therapy

## 2015-06-30 ENCOUNTER — Encounter: Payer: Medicare Other | Admitting: Physical Therapy

## 2015-07-01 ENCOUNTER — Encounter: Payer: Medicare Other | Admitting: Occupational Therapy

## 2015-07-02 ENCOUNTER — Ambulatory Visit: Payer: Medicare Other | Admitting: Physical Therapy

## 2015-07-03 ENCOUNTER — Encounter: Payer: Medicare Other | Admitting: Occupational Therapy

## 2015-07-06 ENCOUNTER — Encounter: Payer: Medicare Other | Admitting: Occupational Therapy

## 2015-07-07 ENCOUNTER — Ambulatory Visit: Payer: Medicare Other | Admitting: Physical Therapy

## 2015-07-08 ENCOUNTER — Telehealth: Payer: Self-pay | Admitting: Family Medicine

## 2015-07-08 ENCOUNTER — Encounter: Payer: Medicare Other | Admitting: Occupational Therapy

## 2015-07-08 NOTE — Telephone Encounter (Signed)
It takes a while--months to get used to CPAP.

## 2015-07-08 NOTE — Telephone Encounter (Signed)
Pt states she is unable to tolerate the pressures on CPAP.She feels like she is not getting enough air when she first goes to sleep but when she is in deep sleep she feels as if she is getting to much air.I spoke with Anderson Malta at [facility] who states pt is wearing CPAP less than 4 hours and her AHI is 14 on # 8

## 2015-07-08 NOTE — Telephone Encounter (Signed)
Please review-aa 

## 2015-07-09 ENCOUNTER — Ambulatory Visit: Payer: Medicare Other | Admitting: Physical Therapy

## 2015-07-09 NOTE — Telephone Encounter (Signed)
Patient's husband Rhae Lerner

## 2015-07-10 ENCOUNTER — Encounter: Payer: Medicare Other | Admitting: Occupational Therapy

## 2015-07-13 ENCOUNTER — Encounter: Payer: Medicare Other | Admitting: Occupational Therapy

## 2015-07-14 ENCOUNTER — Ambulatory Visit: Payer: Medicare Other | Admitting: Physical Therapy

## 2015-07-15 ENCOUNTER — Other Ambulatory Visit: Payer: Self-pay | Admitting: General Surgery

## 2015-07-15 ENCOUNTER — Ambulatory Visit
Admission: RE | Admit: 2015-07-15 | Discharge: 2015-07-15 | Disposition: A | Discharge status: Home / Self Care | Payer: Medicare Other | Source: Ambulatory Visit | Attending: General Surgery | Admitting: General Surgery

## 2015-07-15 ENCOUNTER — Encounter: Payer: Medicare Other | Admitting: Occupational Therapy

## 2015-07-15 DIAGNOSIS — R928 Other abnormal and inconclusive findings on diagnostic imaging of breast: Secondary | ICD-10-CM | POA: Diagnosis not present

## 2015-07-15 DIAGNOSIS — C50412 Malignant neoplasm of upper-outer quadrant of left female breast: Secondary | ICD-10-CM

## 2015-07-16 ENCOUNTER — Encounter: Payer: Self-pay | Admitting: *Deleted

## 2015-07-16 ENCOUNTER — Ambulatory Visit: Payer: Medicare Other | Admitting: Physical Therapy

## 2015-07-17 ENCOUNTER — Encounter: Payer: Medicare Other | Admitting: Occupational Therapy

## 2015-07-20 ENCOUNTER — Encounter: Payer: Medicare Other | Admitting: Occupational Therapy

## 2015-07-22 ENCOUNTER — Encounter: Payer: Medicare Other | Admitting: Occupational Therapy

## 2015-07-22 ENCOUNTER — Encounter: Payer: Self-pay | Admitting: General Surgery

## 2015-07-22 ENCOUNTER — Ambulatory Visit (INDEPENDENT_AMBULATORY_CARE_PROVIDER_SITE_OTHER): Payer: Medicare Other | Admitting: General Surgery

## 2015-07-22 VITALS — BP 126/74 | HR 64 | Resp 14 | Ht 69.0 in | Wt 209.0 lb

## 2015-07-22 DIAGNOSIS — C50412 Malignant neoplasm of upper-outer quadrant of left female breast: Secondary | ICD-10-CM

## 2015-07-22 DIAGNOSIS — R600 Localized edema: Secondary | ICD-10-CM | POA: Diagnosis not present

## 2015-07-22 NOTE — Progress Notes (Addendum)
[name], female   DOB: 1945-05-07, 70 y.o.   MRN: [id number]  Chief Complaint  Patient presents with  . Follow-up    mammogram    HPI Terri Perez is a 70 y.o. female who presents for a breast evaluation. The most recent mammogram was done on 07/15/15. Patient does perform regular self breast checks and gets regular mammograms done.   The patient reports that she developed lower extremity edema bilaterally in the last several months. She's been followed by physical therapy and fitted for compressive stockings as well as a lymphedema sleeve. She has not had a follow-up since she's been using the sleeve for approximate 6-8 weeks.  I personally reviewed the patient's history.  HPI  Past Medical History  Diagnosis Date  . Personal history of fibromyalgia 2002  . Unspecified constipation   . H/O cystitis 2011  . Special screening for malignant neoplasms, colon   . Arm fracture     right forearm, d/t fall  . Personal history of malignant neoplasm of large intestine   . Bowel trouble 2010  . Cancer [facility]) 12/2009    Left UOQ breast tumor; wide excision,sn bx and whole breast radiation. Chemotherapy done. There was only a 21m area of residual tumor remaining. All sn were negative. This was ER-positive, PR-negative, HER-2 neu not over expressed tumor. The original ultrasound size was 2.6 cm(T2).  . 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 On wide excision 3 mm area of residual tumor. Sentinel node negative. ER-30%, PR-negative, HER-2 not overexpressing.  . Breast cancer ([facility] 12/2009    LEFT LUMPECTOMY, chemo and radiation    Past Surgical History  Procedure Laterality Date  . Colonoscopy  2010    Dr. WAllen Norris normal  . Portacath placement  2011  . Port-a-cath removal  2013  . Breast surgery Left 2011    wide excision  . Breast cyst aspiration Left 1985  . Skin cancer  excision  1980    face  . Tubal ligation  1973  . Abdominal hysterectomy  1974  . Back surgery  1985    bone spurs between 5&6, used bone from left hip  . [facility]  . Femur fracture surgery Right March 2016  . Joint replacement Bilateral Jan 2016 and March 2016    hip  . Cervical discectomy    . Upper gi endoscopy  08/20/07    gastritis without hemorrhage  . Breast biopsy Left 2011    POS    Family History  Problem Relation Age of Onset  . Cancer Brother     colon  . Cancer Other     breast and ovarian cancers, relationships not listed  . Cancer Other 535   niece  . Stroke Mother   . Osteoporosis Mother   . Depression Sister   . Depression Sister   . Depression Sister   . Breast cancer Neg Hx     Social History Social History  Substance Use Topics  . Smoking status: Never Smoker   . Smokeless tobacco: Never Used  . Alcohol Use: No    Allergies  Allergen Reactions  . Alendronate Nausea Only    Gi symptoms  . Gluten Meal Nausea Only    Other reaction(s): NAUSEA Other reaction(s): NAUSEA Other reaction(s): NAUSEA  . Lac Bovis Nausea And Vomiting  . Milk-Related  Compounds   . Nsaids     GI upset.  . Other Other (See Comments)    Other reaction(s): OTHER  . Oxycodone-Acetaminophen   . Oxycodone-Acetaminophen Other (See Comments)    Other reaction(s): NAUSEA  . Vicodin [Hydrocodone-Acetaminophen] Nausea Only, Diarrhea and Nausea And Vomiting  . Wheat Bran Other (See Comments)  . Sulfa Antibiotics Rash    Rash from steri strips  . Tape Rash    Rash from steri strips    Current Outpatient Prescriptions  Medication Sig Dispense Refill  . ALPRAZolam (XANAX) 0.5 MG tablet Take 1 tablet (0.5 mg total) by mouth every 4 (four) hours as needed. for pain 90 tablet 5  . aspirin-acetaminophen-caffeine (EXCEDRIN MIGRAINE) 644-034-74 MG tablet Take by mouth. Reported on 06/19/2015    . cyclobenzaprine (FLEXERIL) 10 MG tablet Take 10 mg by  mouth as needed for muscle spasms.    Marland Kitchen omeprazole (PRILOSEC) 20 MG capsule Take by mouth. Reported on 07/22/2015    . traMADol (ULTRAM) 50 MG tablet Take 1 tablet (50 mg total) by mouth every 4 (four) hours as needed. (Patient taking differently: Take 50 mg by mouth as needed. ) 180 tablet 5   No current facility-administered medications for this visit.    Review of Systems Review of Systems  Constitutional: Negative.   Respiratory: Negative.   Cardiovascular: Negative.     Blood pressure 126/74, pulse 64, resp. rate 14, height _0  (1.753 m), weight 209 lb (94.802 kg).  Physical Exam Physical Exam  Constitutional: She is oriented to person, place, and time. She appears well-developed and well-nourished.  Eyes: Conjunctivae are normal. No scleral icterus.  Neck: Neck supple.  Cardiovascular: Normal rate, regular rhythm and normal heart sounds.   Pulmonary/Chest: Effort normal and breath sounds normal. Right breast exhibits no inverted nipple, no mass, no nipple discharge, no skin change and no tenderness. Left breast exhibits tenderness. Left breast exhibits no inverted nipple, no mass, no nipple discharge and no skin change.    Little volume loss left breast.  Abdominal: Soft. Bowel sounds are normal. There is no tenderness.  Lymphadenopathy:    She has no cervical adenopathy.    She has no axillary adenopathy.  Neurological: She is alert and oriented to person, place, and time.  Skin: Skin is warm and dry.    Data Reviewed Bilateral diagnostic mammograms dated 07/15/2015 reviewed. No interval change. BI-RADS 2.  PCP notes of 04/24/2015.  Laboratory studies of same date including rheumatoid factor (mildly elevated), elevated C-reactive protein. Normal sedimentation rate. Normal liver function studies and CBC. Minimal elevation of serum creatinine.  Rheumatology clinic evaluation of 05/11/2015.  Assessment    Stable breast exam. Normal mammograms.  Bilateral lower  extremity edema.    Plan  The lower extremity edema only began this year. The patient suffered a right femur fracture last year treated at [facility]. Unilateral (right) edema based on her previous orthopedic issues, would not be a concern. The new onset of bilateral lower extremity edema is concern for possible clotting disorder. She has not had a venous ultrasound this we arranged to be sure that clots have not been overlooked.    Patient to have a bilateral lower extremity ultrasound at [place] for 07-29-15 at 1:45 pm (arrive 1:30 pm).  This patient will be asked to return in one year with a bilateral diagnostic mammogram.     PCP: [place] Mon, Richard   This information has been scribed by Gaspar Cola CMA.  [place],  Forest Gleason 07/23/2015, 12:14 PM

## 2015-07-22 NOTE — Patient Instructions (Signed)
We will schedule you for a left leg ultrasound. Follow up here one year with mammogram.

## 2015-07-22 NOTE — Progress Notes (Deleted)
[name], female   DOB: 1945/10/26, 70 y.o.   MRN: [id number]  No chief complaint on file.   HPI Terri Perez is a 70 y.o. female.  *** HPI  Past Medical History  Diagnosis Date  . Personal history of fibromyalgia 2002  . Unspecified constipation   . H/O cystitis 2011  . Special screening for malignant neoplasms, colon   . Arm fracture     right forearm, d/t fall  . Personal history of malignant neoplasm of large intestine   . Bowel trouble 2010  . Cancer [facility]) 12/2009    Left UOQ breast tumor; wide excision,sn bx and whole breast radiation. Chemotherapy done. There was only a 48m area of residual tumor remaining. All sn were negative. This was ER-positive, PR-negative, HER-2 neu not over expressed tumor. The original ultrasound size was 2.6 cm(T2).  . 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 On wide excision 3 mm area of residual tumor. Sentinel node negative. ER-30%, PR-negative, HER-2 not overexpressing.  . Breast cancer ([facility] 12[facility]    LEFT LUMPECTOMY, chemo and radiation    Past Surgical History  Procedure Laterality Date  . Colonoscopy  2010    Dr. WAllen Norris normal  . Portacath placement  2011  . Port-a-cath removal  2013  . Breast surgery Left 2011    wide excision  . Breast cyst aspiration Left 1985  . Skin cancer excision  1980    face  . Tubal ligation  1973  . Abdominal hysterectomy  1974  . Back surgery  1985    bone spurs between 5&6, used bone from left hip  . [facility]  . Femur fracture surgery Right March 2016  . Joint replacement Bilateral Jan 2016 and March 2016    hip  . Cervical discectomy    . Upper gi endoscopy  08/20/07    gastritis without hemorrhage  . Breast biopsy Left 2011    POS    Family History  Problem Relation Age of Onset  . Cancer Brother     colon  . Cancer Other     breast and  ovarian cancers, relationships not listed  . Cancer Other 555   niece  . Stroke Mother   . Osteoporosis Mother   . Depression Sister   . Depression Sister   . Depression Sister   . Breast cancer Neg Hx     Social History Social History  Substance Use Topics  . Smoking status: Never Smoker   . Smokeless tobacco: Never Used  . Alcohol Use: No    Allergies  Allergen Reactions  . Alendronate Nausea Only    Gi symptoms  . Gluten Meal Nausea Only    Other reaction(s): NAUSEA Other reaction(s): NAUSEA Other reaction(s): NAUSEA  . Lac Bovis Nausea And Vomiting  . Milk-Related Compounds   . Nsaids     GI upset.  . Other Other (See Comments)    Other reaction(s): OTHER  . Oxycodone-Acetaminophen   . Oxycodone-Acetaminophen Other (See Comments)    Other reaction(s): NAUSEA  . Vicodin [Hydrocodone-Acetaminophen] Nausea Only, Diarrhea and Nausea And Vomiting  . Wheat Bran Other (See Comments)  . Sulfa Antibiotics Rash    Rash from steri strips  . Tape Rash    Rash from steri strips    Current Outpatient Prescriptions  Medication Sig Dispense Refill  .  ALPRAZolam (XANAX) 0.5 MG tablet Take 1 tablet (0.5 mg total) by mouth every 4 (four) hours as needed. for pain 90 tablet 5  . aspirin-acetaminophen-caffeine (EXCEDRIN MIGRAINE) [phone] MG tablet Take by mouth. Reported on 06/19/2015    . calcium carbonate 1250 MG capsule Take 1,250 mg by mouth 2 (two) times daily with a meal. Reported on 06/19/2015    . cyclobenzaprine (FLEXERIL) 10 MG tablet Take 10 mg by mouth as needed for muscle spasms.    Marland Kitchen omeprazole (PRILOSEC) 20 MG capsule Take by mouth.    . traMADol (ULTRAM) 50 MG tablet Take 1 tablet (50 mg total) by mouth every 4 (four) hours as needed. (Patient taking differently: Take 50 mg by mouth as needed. ) 180 tablet 5   No current facility-administered medications for this visit.    Review of Systems Review of Systems  There were no vitals taken for this  visit.  Physical Exam Physical Exam  Data Reviewed ***  Assessment    ***    Plan    ***       Gaspar Cola 07/22/2015, 1:27 PM

## 2015-07-23 DIAGNOSIS — R600 Localized edema: Secondary | ICD-10-CM | POA: Insufficient documentation

## 2015-07-24 ENCOUNTER — Encounter: Payer: Medicare Other | Admitting: Occupational Therapy

## 2015-07-29 ENCOUNTER — Ambulatory Visit
Admission: RE | Admit: 2015-07-29 | Discharge: 2015-07-29 | Disposition: A | Discharge status: Home / Self Care | Payer: Medicare Other | Source: Ambulatory Visit | Attending: General Surgery | Admitting: General Surgery

## 2015-07-29 DIAGNOSIS — R600 Localized edema: Secondary | ICD-10-CM | POA: Diagnosis not present

## 2015-07-30 ENCOUNTER — Telehealth: Payer: Self-pay | Admitting: *Deleted

## 2015-07-30 NOTE — Telephone Encounter (Signed)
Notified patient as instructed, patient pleased. Discussed follow-up appointments, patient agrees  

## 2015-07-30 NOTE — Telephone Encounter (Signed)
-----   Message from Robert Bellow, MD sent at 07/29/2015  4:02 PM EDT ----- Please notify the patient that the ultrasound does not show any evidence of blood clots. Be sure she is followed up with the lymphedema clinic for reassessment of her ongoing edema. Thank you ----- Message -----    From: Rad Results In Interface    Sent: 07/29/2015   3:01 PM      To: Robert Bellow, MD

## 2015-08-20 ENCOUNTER — Ambulatory Visit: Payer: Medicare Other | Attending: Gastroenterology | Admitting: Occupational Therapy

## 2015-08-20 DIAGNOSIS — I890 Lymphedema, not elsewhere classified: Secondary | ICD-10-CM | POA: Insufficient documentation

## 2015-08-20 DIAGNOSIS — R600 Localized edema: Secondary | ICD-10-CM

## 2015-08-21 NOTE — Therapy (Signed)
[place] MAIN [facility] SERVICES [address] [place], [place], [address] Phone: [phone]   Fax:  [phone]  Occupational Therapy Treatment  Patient Details  Name: Terri Perez MRN: [id number] Date of Birth: [birth year] No Data Recorded  Encounter Date: 08/20/2015      OT End of Session - 08/21/15 1221    Visit Number 12   Number of Visits 36   Date for OT Re-Evaluation 07/28/15   OT Start Time 1115   OT Stop Time 1135   OT Time Calculation (min) 20 min   Activity Tolerance Patient tolerated treatment well;Other (comment);Patient limited by pain  1 hour session was abbreviated to 20 min as Pt was unable to remain seated in WC 2/2 hip pain, and requested we cut it short.   Behavior During Therapy Restless;Anxious;Impulsive      Past Medical History:  Diagnosis Date  . Arm fracture    right forearm, d/t fall  . Bowel trouble 2010  . Breast cancer ([place]) 12/2009   LEFT LUMPECTOMY, chemo and radiation  . Cancer [facility]) 12/2009   Left UOQ breast tumor; wide excision,sn bx and whole breast radiation. Chemotherapy done. There was only a 13m area of residual tumor remaining. All sn were negative. This was ER-positive, PR-negative, HER-2 neu not over expressed tumor. The original ultrasound size was 2.6 cm(T2).  . H/O cystitis 2011  . Malignant neoplasm of upper-outer quadrant of female breast ([facility] January 13, 2010   2+ centimeter tumor, neoadjuvant chemotherapy. On wide excision 3 mm area of residual tumor. Sentinel node negative. ER-30%, PR-negative, HER-2 not overexpressing.  . Personal history of fibromyalgia 2002  . Personal history of malignant neoplasm of large intestine   . Special screening for malignant neoplasms, colon   . Unspecified constipation     Past Surgical History:  Procedure Laterality Date  . ABDOMINAL HYSTERECTOMY  1974  . BACK SURGERY  1985   bone spurs between 5&6, used bone from left hip  . BREAST BIOPSY  Left 2011   POS  . BREAST CYST ASPIRATION Left 1985  . BREAST SURGERY Left 2011   wide excision  . CERVICAL DISCECTOMY    . [facility]  . COLONOSCOPY  2010   Dr. WAllen Norris normal  . FEMUR FRACTURE SURGERY Right March 2016  . JOINT REPLACEMENT Bilateral Jan 2016 and March 2016   hip  . PORT-A-CATH REMOVAL  2013  . PORTACATH PLACEMENT  2011  . SKIN CANCER EXCISION  1980   face  . TUBAL LIGATION  1973  . UPPER GI ENDOSCOPY  08/20/07   gastritis without hemorrhage    There were no vitals filed for this visit.      Subjective Assessment - 08/21/15 1210    Subjective  Pt presents for OT visit 12 to address BLE lymphedema. Pt is accompanied by her spouse. Pt was last seen by OT on 05/27/15. She did not return for 30 day followup as recommended . Pt here today for follow up at urging of  JHervey Ard MD.   Patient is accompained by: Family member   Pertinent History Hx fibromyalgia, Hx malignant large intestine neoplasm, colon resection 2014, abdominal hysterectomy 1974, cervical discectomy, 01/2014 B hip replacemet, 03/2014 R femur fx     Limitations difficulty walking, difficulty w/ all transfers and functional mobility, needs assistance with all basic and instrumental ADLs requiring standing, walking, extended sitting, lifting legs, Pain limiting participation  in social and cvommunity activities, ADL performance, and functional  performance of productive and leisure activities.   Repetition Increases Symptoms   Patient Stated Goals be able to move better, do more for myself, relieve pain   Currently in Pain? Yes   Pain Score 3    Pain Location Hip   Pain Orientation Right;Left   Pain Descriptors / Indicators Tightness;Other (Comment)  joint stiffness   Pain Onset 1 to 4 weeks ago   Pain Frequency Intermittent   Aggravating Factors  unctional transfers   Pain Relieving Factors supine in bed, leg elevation   Effect of Pain on Daily Activities limits basic and  instrumental ADLs, productive and leisure activities, ambulation and functional mobility                      OT Treatments/Exercises (OP) - 08/21/15 0001      ADLs   ADL Education Given Yes     Manual Therapy   Edema Management BLE LE assessed in clinic . LE swelling is well controlled.    Compression Bandaging no compression garments in place today. None brought to clinic for condition sheck. Pt's spouse tells me he has found a local retailer for recommended garments.                OT Education - 08/21/15 1220    Education provided Yes   Education Details Reviewed all progress towards short term goals and discussed long term sel-management of LE to limit progression. Reviewed all LE precautions, including csigns and symptoms of cellulitis/   Person(s) Educated Patient;Spouse   Methods Explanation   Comprehension Verbalized understanding             OT Long Term Goals - 08/21/15 1225      OT LONG TERM GOAL #1   Title Pt able to apply thigh-length, multi layered, gradient compression wraps with maximum caregiver assistance within 2 weeks to achieve optimal limb volume reduction.   Baseline dependent   Time 2   Period Weeks   Status Achieved     OT LONG TERM GOAL #2   Title Pt to achieve at least 10% BLE limb volume reductions during Intensive CDT to limit LE progression, decrease infection risk and fall risk, to reduce pain/ discomfort, and to improve safe ambulation and functional mobility.   Baseline dependent    Time 12   Period Weeks   Status Achieved     OT LONG TERM GOAL #3   Title Pt >/= 85 % compliant with all daily, LE self-care protocols, including simple self-manual lymphatic drainage (MLD), skin care, lymphatic pumping the ex, skin care, and donning/ doffing compression wraps and garments with needed level of caregiver assistance to limit LE progression and further functional decline.     Baseline dependent    Time 12   Period Weeks    Status Achieved     OT LONG TERM GOAL #4   Title During Management Phase CDT Pt to sustain limb volume reductions achieved during Intensive Phase CDT within 5% utilizing LE self-care protocols, appropriate compression garments/ devices, and needed level of caregiver assistance.   Baseline dependent    Time 6   Period Months   Status Achieved     OT LONG TERM GOAL #5   Baseline dependent    Status New               Plan - 08/21/15 1229    Clinical Impression Statement  Pt has met all OT goals for lymphedema management. Pt's spouse is supportive and diligent with appropriate level of assistance. Pt hand spouse utilizing all tools and strategies learned in OT to control swelling and to  limit infection  risk and LE progression, including ther ex ( walkg daily) , sequential pneumatic compression pump (Flexitouch) to simulate MLD) on alternating legs for 1 hr daily, skin care with low pH lotion, and  waking hours wear of OTS Juzo ccl 2 thigh high compression garments. Pt is DISCHARGED FROM OT today for LE care. She will call with future questions and/ or concerns.   Rehab Potential Good   OT Frequency 3x / week   OT Duration 12 weeks   OT Treatment/Interventions Self-care/ADL training;DME and/or AE instruction;Manual lymph drainage;Patient/family education;Compression bandaging;Therapeutic exercises;Therapeutic activities;Manual Therapy   Consulted and Agree with Plan of Care Patient;Family member/caregiver      Patient will benefit from skilled therapeutic intervention in order to improve the following deficits and impairments:  Abnormal gait, Decreased cognition, Decreased knowledge of use of DME, Decreased skin integrity, Increased edema, Impaired flexibility, Decreased mobility, Decreased activity tolerance, Decreased endurance, Decreased range of motion, Decreased strength, Decreased balance, Decreased knowledge of precautions, Difficulty walking, Impaired perceived functional  ability, Pain  Visit Diagnosis: Lymphedema, not elsewhere classified  Bilateral lower extremity edema      G-Codes - 2015-09-02 1226    Functional Assessment Tool Used observation, physical examination, interview   Functional Limitation Self care   Self Care Current Status (W9798) At least 60 percent but less than 80 percent impaired, limited or restricted   Self Care Discharge Status [phone]) At least 60 percent but less than 80 percent impaired, limited or restricted      Problem List Patient Active Problem List   Diagnosis Date Noted  . Bilateral lower extremity edema 07/23/2015  . 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 Chronic kidney disease (CKD), stage III (moderate) 05/11/2015  . Elevated rheumatoid factor 05/11/2015  . Primary osteoarthritis of both hips 05/11/2015  . Episode of syncope 04/24/2015  . History of operative procedure on hip 04/24/2015  . Restless leg 04/24/2015  . Osteoporosis 03/30/2015  . Insomnia 03/30/2015  . Skin cancer 03/30/2015  . MDD (major depressive disorder) ([place]) 03/30/2015  . Migraine 03/30/2015  . GERD (gastroesophageal reflux disease) 03/30/2015  . IBS (irritable bowel syndrome) 03/30/2015  . OA (osteoarthritis) 03/30/2015  . Skin lesion of chest wall 07/17/2014  . Closed fracture of shaft of femur ([place]) 04/01/2014  . Fracture of bone adjacent to prosthesis ([place]) 04/01/2014  . Breast cancer ([place]) 03/07/2013  . Personal history of malignant neoplasm of large intestine   . 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 Acute anxiety 06/11/2008  . CONSTIPATION, CHRONIC 06/11/2008  . Fibromyalgia 06/11/2008    Ansel Bong 2015-09-02, 12:42 PM  [place] MAIN [facility] SERVICES [address] [place], [place], [address] Phone: [phone]   Fax:  [phone]  Name: Terri Perez MRN: [id number] Date of Birth: 1945/11/09

## 2015-08-21 NOTE — Therapy (Addendum)
[place] MAIN [facility] SERVICES [address] [place], [place], [address] Phone: [phone]   Fax:  [phone]  Occupational Therapy Treatment Note and Discharge Summary  Patient Details  Name: Terri Perez MRN: [id number] Date of Birth: April 05, 1945 No Data Recorded  Encounter Date: 08/20/2015      OT End of Session - 08/21/15 1221    Visit Number 12   Number of Visits 36   Date for OT Re-Evaluation 07/28/15   OT Start Time 1115   OT Stop Time 1135   OT Time Calculation (min) 20 min   Activity Tolerance Patient tolerated treatment well;Other (comment);Patient limited by pain  1 hour session was abbreviated to 20 min as Pt was unable to remain seated in WC 2/2 hip pain, and requested we cut it short.   Behavior During Therapy Restless;Anxious;Impulsive      Past Medical History:  Diagnosis Date  . Arm fracture    right forearm, d/t fall  . Bowel trouble 2010  . Breast cancer ([place]) 12/2009   LEFT LUMPECTOMY, chemo and radiation  . Cancer [facility]) 12/2009   Left UOQ breast tumor; wide excision,sn bx and whole breast radiation. Chemotherapy done. There was only a 45m area of residual tumor remaining. All sn were negative. This was ER-positive, PR-negative, HER-2 neu not over expressed tumor. The original ultrasound size was 2.6 cm(T2).  . H/O cystitis 2011  . Malignant neoplasm of upper-outer quadrant of female breast ([facility] January 13, 2010   2+ centimeter tumor, neoadjuvant chemotherapy. On wide excision 3 mm area of residual tumor. Sentinel node negative. ER-30%, PR-negative, HER-2 not overexpressing.  . Personal history of fibromyalgia 2002  . Personal history of malignant neoplasm of large intestine   . Special screening for malignant neoplasms, colon   . Unspecified constipation     Past Surgical History:  Procedure Laterality Date  . ABDOMINAL HYSTERECTOMY  1974  . BACK SURGERY  1985   bone spurs between 5&6, used bone from  left hip  . BREAST BIOPSY Left 2011   POS  . BREAST CYST ASPIRATION Left 1985  . BREAST SURGERY Left 2011   wide excision  . CERVICAL DISCECTOMY    . [facility]  [facility]  . COLONOSCOPY  2010   Dr. WAllen Norris normal  . FEMUR FRACTURE SURGERY Right March 2016  . JOINT REPLACEMENT Bilateral Jan 2016 and March 2016   hip  . PORT-A-CATH REMOVAL  2013  . PORTACATH PLACEMENT  2011  . SKIN CANCER EXCISION  1980   face  . TUBAL LIGATION  1973  . UPPER GI ENDOSCOPY  08/20/07   gastritis without hemorrhage    There were no vitals filed for this visit.      Subjective Assessment - 08/21/15 1210    Subjective  Pt presents for OT visit 12 to address BLE lymphedema. Pt is accompanied by her spouse. Pt was last seen by OT on 05/27/15. She did not return for 30 day followup as recommended . Pt here today for follow up at urging of  JHervey Ard MD.   Patient is accompained by: Family member   Pertinent History Hx fibromyalgia, Hx malignant large intestine neoplasm, colon resection 2014, abdominal hysterectomy 1974, cervical discectomy, 01/2014 B hip replacemet, 03/2014 R femur fx     Limitations difficulty walking, difficulty w/ all transfers and functional mobility, needs assistance with all basic and instrumental ADLs requiring standing, walking, extended sitting, lifting  legs, Pain limiting participation in social and cvommunity activities, ADL performance, and functional  performance of productive and leisure activities.   Repetition Increases Symptoms   Patient Stated Goals be able to move better, do more for myself, relieve pain   Currently in Pain? Yes   Pain Score 3    Pain Location Hip   Pain Orientation Right;Left   Pain Descriptors / Indicators Tightness;Other (Comment)  joint stiffness   Pain Onset 1 to 4 weeks ago   Pain Frequency Intermittent   Aggravating Factors  unctional transfers   Pain Relieving Factors supine in bed, leg elevation   Effect of Pain on Daily  Activities limits basic and instrumental ADLs, productive and leisure activities, ambulation and functional mobility                      OT Treatments/Exercises (OP) - 08/21/15 0001      ADLs   ADL Education Given Yes     Manual Therapy   Edema Management BLE LE assessed in clinic . LE swelling is well controlled.    Compression Bandaging no compression garments in place today. None brought to clinic for condition sheck. Pt's spouse tells me he has found a local retailer for recommended garments.                OT Education - 08/21/15 1220    Education provided Yes   Education Details Reviewed all progress towards short term goals and discussed long term sel-management of LE to limit progression. Reviewed all LE precautions, including csigns and symptoms of cellulitis/   Person(s) Educated Patient;Spouse   Methods Explanation   Comprehension Verbalized understanding             OT Long Term Goals - 08/21/15 1225      OT LONG TERM GOAL #1   Title Pt able to apply thigh-length, multi layered, gradient compression wraps with maximum caregiver assistance within 2 weeks to achieve optimal limb volume reduction.   Baseline dependent   Time 2   Period Weeks   Status Achieved     OT LONG TERM GOAL #2   Title Pt to achieve at least 10% BLE limb volume reductions during Intensive CDT to limit LE progression, decrease infection risk and fall risk, to reduce pain/ discomfort, and to improve safe ambulation and functional mobility.   Baseline dependent    Time 12   Period Weeks   Status Achieved     OT LONG TERM GOAL #3   Title Pt >/= 85 % compliant with all daily, LE self-care protocols, including simple self-manual lymphatic drainage (MLD), skin care, lymphatic pumping the ex, skin care, and donning/ doffing compression wraps and garments with needed level of caregiver assistance to limit LE progression and further functional decline.     Baseline dependent     Time 12   Period Weeks   Status Achieved     OT LONG TERM GOAL #4   Title During Management Phase CDT Pt to sustain limb volume reductions achieved during Intensive Phase CDT within 5% utilizing LE self-care protocols, appropriate compression garments/ devices, and needed level of caregiver assistance.   Baseline dependent    Time 6   Period Months   Status Achieved     OT LONG TERM GOAL #5   Baseline dependent    Status New               Plan - 08/21/15 1229  Clinical Impression Statement Pt has met all OT goals for lymphedema management. Pt's spouse is supportive and diligent with appropriate level of assistance. Pt hand spouse utilizing all tools and strategies learned in OT to control swelling and to  limit infection  risk and LE progression, including ther ex ( walkg daily) , sequential pneumatic compression pump (Flexitouch) to simulate MLD) on alternating legs for 1 hr daily, skin care with low pH lotion, and  waking hours wear of OTS Juzo ccl 2 thigh high compression garments. Pt is DISCHARGED FROM OT today for LE care. She will call with future questions and/ or concerns.   Rehab Potential Good   OT Frequency 3x / week   OT Duration 12 weeks   OT Treatment/Interventions Self-care/ADL training;DME and/or AE instruction;Manual lymph drainage;Patient/family education;Compression bandaging;Therapeutic exercises;Therapeutic activities;Manual Therapy   Consulted and Agree with Plan of Care Patient;Family member/caregiver      Patient will benefit from skilled therapeutic intervention in order to improve the following deficits and impairments:  Abnormal gait, Decreased cognition, Decreased knowledge of use of DME, Decreased skin integrity, Increased edema, Impaired flexibility, Decreased mobility, Decreased activity tolerance, Decreased endurance, Decreased range of motion, Decreased strength, Decreased balance, Decreased knowledge of precautions, Difficulty walking, Impaired  perceived functional ability, Pain  Visit Diagnosis: Lymphedema, not elsewhere classified      G-Codes - 09/04/2015 1226    Functional Assessment Tool Used observation, physical examination, interview   Functional Limitation Self care   Self Care Current Status (T5320) At least 60 percent but less than 80 percent impaired, limited or restricted   Self Care Discharge Status [phone]) At least 60 percent but less than 80 percent impaired, limited or restricted      Problem List Patient Active Problem List   Diagnosis Date Noted  . Bilateral lower extremity edema 07/23/2015  . 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 Chronic kidney disease (CKD), stage III (moderate) 05/11/2015  . Elevated rheumatoid factor 05/11/2015  . Primary osteoarthritis of both hips 05/11/2015  . Episode of syncope 04/24/2015  . History of operative procedure on hip 04/24/2015  . Restless leg 04/24/2015  . Osteoporosis 03/30/2015  . Insomnia 03/30/2015  . Skin cancer 03/30/2015  . MDD (major depressive disorder) ([place]) 03/30/2015  . Migraine 03/30/2015  . GERD (gastroesophageal reflux disease) 03/30/2015  . IBS (irritable bowel syndrome) 03/30/2015  . OA (osteoarthritis) 03/30/2015  . Skin lesion of chest wall 07/17/2014  . Closed fracture of shaft of femur ([place]) 04/01/2014  . Fracture of bone adjacent to prosthesis ([place]) 04/01/2014  . Breast cancer ([place]) 03/07/2013  . Personal history of malignant neoplasm of large intestine   . 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 Acute anxiety 06/11/2008  . CONSTIPATION, CHRONIC 06/11/2008  . Fibromyalgia 06/11/2008   Andrey Spearman, MS, OTR/L, [facility] 09/04/15 12:33 PM   [place] MAIN [facility] SERVICES [address] [place], [place], [address] Phone: [phone]   Fax:  [phone]  Name: Terri Perez MRN: [id number] Date of Birth: 02-Oct-1945

## 2015-08-21 NOTE — Patient Instructions (Signed)
LE instructions and precautions as established- see initial eval.  Lymphedema Precautions  If you experience atypical shortness of breath, or notice any signs /symptoms of skin infection (aka cellulitis) remove all compression wraps/ garments, discontinue manual lymphatic drainage (MLD), and report symptoms to your physician immediately. Discontinue MLD and compression for 72 hours after you take your first oral antibiotic so not to spread the infection.   Lymphedema Self- Care Instructions  1. EXERCISE: Perform lymphatic pumping there ex 2 x a day. While wearing your compression wraps or garments. Perform 10 reps of each exercise bilaterally and be sure to perform them in order. Don;t skip around!  OMIT PARTIAL SIT UP  2. MLD: Perform simple self-Manual Lymphatic Drainage (MLD) at least once a day as directed.  3. WRAPS: Compression wraps are to be worn 23 hrs/ 7 days/wk during Intensive Phase of Complete Decongestive Therapy (CDT).Building tolerance may take time and practice, so don't get discouraged. If bandages begin to feel tight during periods of inactivity and/or during the night, try performing your exercises to loosen them.   4. GARMENTS: During Management Phase CDT your compression garments are to be worn during waking hours when active. Do NOT sleep in your garments!!   5. PUT YOUR FEET UP! Elevate your feet and legs and feet to the level of your heart whenever you are sitting down.   6. SKIN: Carefully monitor skin condition and perform impeccable hygiene daily. Bathe skin with mild soap and water and apply low pH lotion (aka Eucerin ) to improve hydration and limit infection risk.    Lymphatic Pumping Exercises:            

## 2015-09-01 ENCOUNTER — Encounter: Payer: Self-pay | Admitting: Family Medicine

## 2015-09-01 ENCOUNTER — Ambulatory Visit (INDEPENDENT_AMBULATORY_CARE_PROVIDER_SITE_OTHER): Payer: Medicare Other | Admitting: Family Medicine

## 2015-09-01 VITALS — BP 126/74 | HR 80 | Temp 97.8°F | Resp 16

## 2015-09-01 DIAGNOSIS — M797 Fibromyalgia: Secondary | ICD-10-CM

## 2015-09-01 DIAGNOSIS — R110 Nausea: Secondary | ICD-10-CM

## 2015-09-01 DIAGNOSIS — Z23 Encounter for immunization: Secondary | ICD-10-CM

## 2015-09-01 DIAGNOSIS — Z0000 Encounter for general adult medical examination without abnormal findings: Secondary | ICD-10-CM | POA: Diagnosis not present

## 2015-09-01 MED ORDER — TRAMADOL HCL 50 MG PO TABS: 50.0000 mg | ORAL_TABLET | ORAL | 5 refills | Status: DC | PRN

## 2015-09-01 MED ORDER — PROMETHAZINE HCL 25 MG RE SUPP: 25.0000 mg | Freq: Four times a day (QID) | RECTAL | 0 refills | Status: DC | PRN

## 2015-09-01 NOTE — Progress Notes (Signed)
Patient: Terri Perez, Female    DOB: [birth year], 70 y.o.   MRN: [id number] Visit Date: 09/01/2015  Today's Provider: [name], MD   Chief Complaint  Patient presents with  . annual wellness exam  . Nausea   Subjective:    Annual wellness visit Terri Perez is a 70 y.o. female. She feels fairly well. She reports exercising none. She reports she is sleeping poorly. Patient is having some chronic allergy symptoms which have worsened recently and caused postnasal drainage which causes her nausea. She has been taking her daughter's Phenergan suppositories and this has helped. She has been on chronic lymphedema pump this year and it seems to help some. She continues to have severe anxiety and takes Xanax around the clock. I discussed all these issues with patient and the husband present.  -----------------------------------------------------------   Review of Systems  Constitutional: Positive for activity change and fatigue.  HENT: Positive for congestion, postnasal drip and rhinorrhea.   Eyes: Negative.   Respiratory: Negative.   Cardiovascular: Negative.   Endocrine: Negative.   Genitourinary: Negative.   Musculoskeletal: Positive for arthralgias, back pain, gait problem, joint swelling and myalgias.  Allergic/Immunologic: Negative.   Hematological: Negative.   Psychiatric/Behavioral: Negative.     Social History   Social History  . Marital status: Married    Spouse name: N/A  . Number of children: N/A  . Years of education: N/A   Occupational History  . Not on file.   Social History Main Topics  . Smoking status: Never Smoker  . Smokeless tobacco: Never Used  . Alcohol use No  . Drug use: No  . Sexual activity: No   Other Topics Concern  . Not on file   Social History Narrative  . No narrative on file    Past Medical History:  Diagnosis Date  . Arm fracture    right forearm, d/t fall  . Bowel trouble 2010  . Breast cancer  ([place]) 12/2009   LEFT LUMPECTOMY, chemo and radiation  . Cancer [facility]) 12/2009   Left UOQ breast tumor; wide excision,sn bx and whole breast radiation. Chemotherapy done. There was only a 61m area of residual tumor remaining. All sn were negative. This was ER-positive, PR-negative, HER-2 neu not over expressed tumor. The original ultrasound size was 2.6 cm(T2).  . H/O cystitis 2011  . 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 On wide excision 3 mm area of residual tumor. Sentinel node negative. ER-30%, PR-negative, HER-2 not overexpressing.  . Personal history of fibromyalgia 2002  . Personal history of malignant neoplasm of large intestine   . Special screening for malignant neoplasms, colon   . Unspecified constipation      Patient Active Problem List   Diagnosis Date Noted  . Bilateral lower extremity edema 07/23/2015  . 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 Chronic kidney disease (CKD), stage III (moderate) 05/11/2015  . Elevated rheumatoid factor 05/11/2015  . Primary osteoarthritis of both hips 05/11/2015  . Episode of syncope 04/24/2015  . History of operative procedure on hip 04/24/2015  . Restless leg 04/24/2015  . Osteoporosis 03/30/2015  . Insomnia 03/30/2015  . Skin cancer 03/30/2015  . MDD (major depressive disorder) ([place] 03/30/2015  . Migraine 03/30/2015  . GERD (gastroesophageal reflux disease) 03/30/2015  . IBS (irritable bowel syndrome) 03/30/2015  . OA (osteoarthritis) 03/30/2015  . Skin lesion of chest wall 07/17/2014  .  Closed fracture of shaft of femur ([place]) 04/01/2014  . Fracture of bone adjacent to prosthesis ([place]) 04/01/2014  . Breast cancer ([place]) 03/07/2013  . Personal history of malignant neoplasm of large intestine   . Malignant neoplasm of upper-outer quadrant of female breast ([place])   . Acute anxiety 06/11/2008  . CONSTIPATION, CHRONIC 06/11/2008  .  Fibromyalgia 06/11/2008    Past Surgical History:  Procedure Laterality Date  . ABDOMINAL HYSTERECTOMY  1974  . BACK SURGERY  1985   bone spurs between 5&6, used bone from left hip  . BREAST BIOPSY Left 2011   POS  . BREAST CYST ASPIRATION Left 1985  . BREAST SURGERY Left 2011   wide excision  . CERVICAL DISCECTOMY    . COLON RESECTION  Sept 2014   [facility]   . COLONOSCOPY  2010   Dr. Allen Norris, normal  . FEMUR FRACTURE SURGERY Right March 2016  . JOINT REPLACEMENT Bilateral Jan 2016 and March 2016   hip  . PORT-A-CATH REMOVAL  2013  . PORTACATH PLACEMENT  2011  . SKIN CANCER EXCISION  1980   face  . TUBAL LIGATION  1973  . UPPER GI ENDOSCOPY  08/20/07   gastritis without hemorrhage    Her family history includes Cancer in her brother and other; Cancer (age of onset: 4) in her other; Depression in her sister, sister, and sister; Osteoporosis in her mother; Stroke in her mother.    Current Meds  Medication Sig  . ALPRAZolam (XANAX) 0.5 MG tablet Take 1 tablet (0.5 mg total) by mouth every 4 (four) hours as needed. for pain  . aspirin-acetaminophen-caffeine (EXCEDRIN MIGRAINE) 250-250-65 MG tablet Take by mouth. Reported on 06/19/2015  . cyclobenzaprine (FLEXERIL) 10 MG tablet Take 10 mg by mouth as needed for muscle spasms.  Marland Kitchen omeprazole (PRILOSEC) 20 MG capsule Take by mouth. Reported on 07/22/2015  . traMADol (ULTRAM) 50 MG tablet Take 1 tablet (50 mg total) by mouth every 4 (four) hours as needed.  . [DISCONTINUED] traMADol (ULTRAM) 50 MG tablet Take 1 tablet (50 mg total) by mouth every 4 (four) hours as needed. (Patient taking differently: Take 50 mg by mouth as needed. )    Patient Care Team: Jerrol Banana., MD as PCP - General (Family Medicine) Robert Bellow, MD (General Surgery) Jerrol Banana., MD (Family Medicine)    Objective:   Vitals: BP 126/74 (BP Location: Right Arm, Patient Position: Sitting, Cuff Size: Normal)   Pulse 80   Temp 97.8 F (36.6  C)   Resp 16   Physical Exam  Constitutional: She is oriented to person, place, and time. She appears well-developed and well-nourished.  HENT:  Head: Normocephalic and atraumatic.  Right Ear: External ear normal.  Left Ear: External ear normal.  Nose: Nose normal.  Eyes: Conjunctivae are normal.  Neck: Neck supple. No thyromegaly present.  Cardiovascular: Normal rate, regular rhythm and normal heart sounds.   Pulmonary/Chest: Effort normal and breath sounds normal.  No breast exam performed today as this is followed by Dr. Bary Castilla  Abdominal: Soft.  Musculoskeletal: She exhibits edema.  Lymphedema present with support hose on.  Lymphadenopathy:    She has no cervical adenopathy.  Neurological: She is alert and oriented to person, place, and time. No cranial nerve deficit.  Patient is seated in a wheelchair throughout the exam. There might be a little bit of rigidity/cogwheeling on exam.  Skin: Skin is warm and dry.  Psychiatric: She has a normal  mood and affect. Her behavior is normal. Judgment and thought content normal.    Activities of Daily Living In your present state of health, do you have any difficulty performing the following activities: 09/01/2015 09/16/2014  Hearing? N N  Vision? Y N  Difficulty concentrating or making decisions? Y N  Walking or climbing stairs? Y Y  Dressing or bathing? Y Y  Doing errands, shopping? Y Y  Some recent data might be hidden    Fall Risk Assessment Fall Risk  09/01/2015 03/30/2015 09/16/2014  Falls in the past year? No Yes Yes  Number falls in past yr: - 2 or more 2 or more  Injury with Fall? - Yes Yes  Risk Factor Category  - High Fall Risk High Fall Risk  Risk for fall due to : - - Impaired mobility  Follow up - Falls evaluation completed -     Depression Screen PHQ 2/9 Scores 09/01/2015 03/30/2015 09/16/2014  PHQ - 2 Score 3 0 0  PHQ- 9 Score 12 - -    Cognitive Testing - 6-CIT  Correct? Score   What year is it? yes 0 0 or 4    What month is it? yes 0 0 or 3  Memorize:    Floyde Parkins,  41,  [address],  [place],      What time is it? (within 1 hour) yes 0 0 or 3  Count backwards from 20 yes 0 0, 2, or 4  Name the months of the year no 4 0, 2, or 4  Repeat name & address above yes 0 0, 2, 4, 6, 8, or 10       TOTAL SCORE  4/28   Interpretation:  Normal  Normal (0-7) Abnormal (8-28)        Assessment & Plan:     Annual Wellness Visit  Reviewed patient's Family Medical History Reviewed and updated list of patient's medical providers Assessment of cognitive impairment was done Assessed patient's functional ability Established a written schedule for health screening services Health Risk Assessent Completed and Reviewed  Exercise Activities and Dietary recommendations Goals    None      Immunization History  Administered Date(s) Administered  . Pneumococcal Conjugate-13 12/12/2013  . Pneumococcal Polysaccharide-23 09/01/2015  . Td 03/01/1995    Health Maintenance  Topic Date Due  . Hepatitis C Screening  1945/02/22  . ZOSTAVAX  01/14/2005  . TETANUS/TDAP  02/28/2005  . COLONOSCOPY  12/21/2013  . PNA vac Low Risk Adult (2 of 2 - PPSV23) 12/13/2014  . INFLUENZA VACCINE  03/29/2016 (Originally 08/25/2015)  . MAMMOGRAM  07/14/2017  . DEXA SCAN  Completed      Discussed health benefits of physical activity, and encouraged her to engage in regular exercise appropriate for her age and condition.    1. Medicare annual wellness visit, subsequent Patient's memory is okay but she has chronic depression and severe anxiety with some OCD features. She also is a all risk because of her chronic medical issues. Her husband essentially has to do everything for her.  2. Fibromyalgia  - traMADol (ULTRAM) 50 MG tablet; Take 1 tablet (50 mg total) by mouth every 4 (four) hours as needed.  Dispense: 180 tablet; Refill: 5  3. Nausea  - promethazine (PHENERGAN) 25 MG suppository; Place 1 suppository (25 mg  total) rectally every 6 (six) hours as needed for nausea or vomiting.  Dispense: 12 each; Refill: 0  4. Need for pneumococcal vaccine  - Pneumococcal polysaccharide  vaccine 23-valent greater than or equal to 2yo subcutaneous/IM 5. Breast cancer/ER positive 6. Chronic depression and anxiety Patient is absolutely obsessed with her Xanax prescription. She takes it 3 times a day around the clock. Her husband states that he is unable to get her to take less. She is barely functional as this would not change anything at this time. 7. Chronic lymphedema  8. History of pelvic and femur  fractures 9. Cervical disc disease/ status post cervical discectomy 10.Status post right hip replacement 11. Status post colectomy for chronic constipation 12. Monitor for early movement disorder/PD [name], MD  [facility]

## 2015-10-12 ENCOUNTER — Telehealth: Payer: Self-pay | Admitting: Family Medicine

## 2015-10-12 DIAGNOSIS — R110 Nausea: Secondary | ICD-10-CM

## 2015-10-12 NOTE — Telephone Encounter (Signed)
Pt needs refill on her promethazine.  [facility] [address]  Thanks Con Memos

## 2015-10-12 NOTE — Telephone Encounter (Signed)
Please review-aa 

## 2015-10-13 MED ORDER — PROMETHAZINE HCL 25 MG RE SUPP: 25.0000 mg | Freq: Four times a day (QID) | RECTAL | 5 refills | Status: DC | PRN

## 2015-10-13 NOTE — Telephone Encounter (Signed)
Done-aa 

## 2015-10-13 NOTE — Telephone Encounter (Signed)
Ok--5 rf. 

## 2015-11-12 ENCOUNTER — Other Ambulatory Visit: Payer: Self-pay | Admitting: Family Medicine

## 2015-11-12 DIAGNOSIS — F419 Anxiety disorder, unspecified: Secondary | ICD-10-CM

## 2015-11-13 ENCOUNTER — Other Ambulatory Visit: Payer: Self-pay | Admitting: Family Medicine

## 2015-11-13 DIAGNOSIS — F419 Anxiety disorder, unspecified: Secondary | ICD-10-CM

## 2015-11-13 NOTE — Telephone Encounter (Signed)
Re called it in and spoke to the pharmacist this time-aa patient's husband advised-aa

## 2015-11-13 NOTE — Telephone Encounter (Signed)
Pt stated they she called [facility] [address] and they advised her they didn't have a record of this medication being called in today. Pt request that we call and speak to someone to call this in again. Please advise. Thanks TNP

## 2015-11-13 NOTE — Telephone Encounter (Signed)
I called this in earlier this morning, husband advised-aa

## 2015-11-13 NOTE — Telephone Encounter (Signed)
[name] Jori Moll stated that [facility]'t [facility] for ALPRAZolam Duanne Moron) 0.5 MG tablet that was approved 11/12/15. Jori Moll would like this called or faxed in ASAP to [facility] [address] b/c he is at the pharmacy waiting. Please advise. Thanks TNP

## 2015-11-18 ENCOUNTER — Other Ambulatory Visit: Payer: Self-pay

## 2015-12-14 ENCOUNTER — Telehealth: Payer: Self-pay | Admitting: Family Medicine

## 2015-12-14 NOTE — Telephone Encounter (Signed)
Dr. Rosanna Randy is out of office this week can you please review? Thanks. KW

## 2015-12-14 NOTE — Telephone Encounter (Signed)
Pt stated that Dr. Rosanna Randy had sent in promethazine (PHENERGAN) 25 MG suppository to help with the nausea she has due to drainage from allergies. Pt would like to see what she can try OTC for allergies or if something could be sent to [facility] Dr. Abbott Pao was advised Dr. Rosanna Randy is out of the office this week. Please advise. Thanks TNP

## 2015-12-14 NOTE — Telephone Encounter (Signed)
May try Flonase (fluticasone) nasal spray 2 sprays in each nostril at bedtime and Zyrtec 10 mg qd for allergies. Recheck with Dr. Rosanna Randy as needed.

## 2015-12-15 NOTE — Telephone Encounter (Signed)
Spoke with patient and she is still having "allergy symptoms of post nasal drip causing nausea". No problem yesterday but it returned this morning. Took another Phenergan Suppository and considering using a Benadryl "throat lozenge" now. Not taking any antihistamines or nasal steroid spray for allergies. Advised to call or return if needed.

## 2015-12-15 NOTE — Telephone Encounter (Signed)
Spoke with patients husband and he says that they have tried that regimen and are now requesting a prescription. Please advise. KW

## 2015-12-24 ENCOUNTER — Telehealth: Payer: Self-pay | Admitting: Family Medicine

## 2015-12-24 NOTE — Telephone Encounter (Signed)
Pt states she does not have any energy and feels tired all the time.  Pt is asking if she can start getting a B12 shots?  Does pt need an appointment or can we schedule this?  CB#[phone]/MW

## 2015-12-25 NOTE — Telephone Encounter (Signed)
Please review-aa 

## 2015-12-26 NOTE — Telephone Encounter (Signed)
Appt needed?

## 2015-12-28 NOTE — Telephone Encounter (Signed)
Pt advised and she will call back to make an appointment-aa

## 2016-01-27 ENCOUNTER — Telehealth: Payer: Self-pay | Admitting: Family Medicine

## 2016-01-27 MED ORDER — PROMETHAZINE HCL 25 MG PO TABS: 25.0000 mg | ORAL_TABLET | Freq: Three times a day (TID) | ORAL | 0 refills | Status: DC | PRN

## 2016-01-27 NOTE — Telephone Encounter (Signed)
[facility]. Patient advised.

## 2016-01-27 NOTE — Telephone Encounter (Signed)
ok 

## 2016-01-27 NOTE — Telephone Encounter (Signed)
Pt called requesting a pill form of the   promethazine (PHENERGAN  [facility]  Thanks teri

## 2016-01-27 NOTE — Telephone Encounter (Signed)
Please review-aa 

## 2016-02-16 ENCOUNTER — Other Ambulatory Visit: Payer: Self-pay

## 2016-02-16 NOTE — Telephone Encounter (Signed)
Pt is requesting refill for phenergan. Pt reports she will needs a PA for this. Renaldo Fiddler, CMA

## 2016-02-18 NOTE — Telephone Encounter (Signed)
Pt [facility] to [facility]  States she knows medicare doesn't pay for it however she will pay out of pocket for it until prior Josem Kaufmann is completed.

## 2016-02-19 MED ORDER — PROMETHAZINE HCL 25 MG PO TABS: 25.0000 mg | ORAL_TABLET | Freq: Three times a day (TID) | ORAL | 0 refills | Status: DC | PRN

## 2016-02-19 NOTE — Telephone Encounter (Signed)
rx sent in-aa 

## 2016-02-19 NOTE — Telephone Encounter (Signed)
Please review. Ok to call this in?-aa

## 2016-02-24 ENCOUNTER — Other Ambulatory Visit: Payer: Self-pay | Admitting: Emergency Medicine

## 2016-03-03 ENCOUNTER — Ambulatory Visit (INDEPENDENT_AMBULATORY_CARE_PROVIDER_SITE_OTHER): Payer: Medicare Other | Admitting: Family Medicine

## 2016-03-03 VITALS — BP 122/80 | HR 78 | Temp 97.9°F | Resp 16

## 2016-03-03 DIAGNOSIS — R110 Nausea: Secondary | ICD-10-CM | POA: Diagnosis not present

## 2016-03-03 DIAGNOSIS — M791 Myalgia, unspecified site: Secondary | ICD-10-CM

## 2016-03-03 DIAGNOSIS — R5383 Other fatigue: Secondary | ICD-10-CM

## 2016-03-03 DIAGNOSIS — Z96641 Presence of right artificial hip joint: Secondary | ICD-10-CM

## 2016-03-03 DIAGNOSIS — M797 Fibromyalgia: Secondary | ICD-10-CM | POA: Diagnosis not present

## 2016-03-03 DIAGNOSIS — R768 Other specified abnormal immunological findings in serum: Secondary | ICD-10-CM

## 2016-03-03 MED ORDER — PROMETHAZINE HCL 25 MG PO TABS: 25.0000 mg | ORAL_TABLET | Freq: Three times a day (TID) | ORAL | 0 refills | Status: DC | PRN

## 2016-03-03 MED ORDER — NORTRIPTYLINE HCL 10 MG PO CAPS: 10.0000 mg | ORAL_CAPSULE | Freq: Every day | ORAL | 12 refills | Status: DC

## 2016-03-03 MED ORDER — METOCLOPRAMIDE HCL 10 MG PO TABS: 10.0000 mg | ORAL_TABLET | Freq: Four times a day (QID) | ORAL | 5 refills | Status: DC

## 2016-03-03 NOTE — Patient Instructions (Addendum)
Take 1 Nortriptyline at beditme for 2 weeks and then take 2 at bedtime.  If patient starts sleeping well enough she can begin to decrease her Alprazolam.    Try Co Q Enzyme 10 200 mg.  Take 1 daily.

## 2016-03-03 NOTE — Progress Notes (Signed)
Terri Perez  MRN: [id number] DOB: 23-Jul-1945  Subjective:  HPI   The patient is a 71 year old female who presents for her 6 month follow up.  She is s/p right hip replacement for the second time.  She has not gotten her strength back and she is unable to move much.  She complains her muscle are so stiff, tense and weak that they spasm and prevent her from being able to do anything to help strengthen them.  She has ha physical therapy and was using the lymphedema pump.  Her therapy was completed and they have not  Been using the pump because they did not think it was helping.   The patient continues to have digestive issues and problems with her fibromyalgia.  She also complains of having difficulty sleeping.     Patient Active Problem List   Diagnosis Date Noted  . Bilateral lower extremity edema 07/23/2015  . 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 Chronic kidney disease (CKD), stage III (moderate) 05/11/2015  . Elevated rheumatoid factor 05/11/2015  . Primary osteoarthritis of both hips 05/11/2015  . Episode of syncope 04/24/2015  . History of operative procedure on hip 04/24/2015  . Restless leg 04/24/2015  . Osteoporosis 03/30/2015  . Insomnia 03/30/2015  . Skin cancer 03/30/2015  . MDD (major depressive disorder) 03/30/2015  . Migraine 03/30/2015  . GERD (gastroesophageal reflux disease) 03/30/2015  . IBS (irritable bowel syndrome) 03/30/2015  . OA (osteoarthritis) 03/30/2015  . Skin lesion of chest wall 07/17/2014  . Closed fracture of shaft of femur ([place]) 04/01/2014  . Fracture of bone adjacent to prosthesis 04/01/2014  . Breast cancer ([place]) 03/07/2013  . Personal history of malignant neoplasm of large intestine   . Malignant neoplasm of upper-outer quadrant of female breast ([place])   . Acute anxiety 06/11/2008  . CONSTIPATION, CHRONIC 06/11/2008  . Fibromyalgia 06/11/2008    Past Medical History:  Diagnosis Date  . Arm fracture    right forearm, d/t fall  .  Bowel trouble 2010  . Breast cancer ([place]) 12/2009   LEFT LUMPECTOMY, chemo and radiation  . Cancer [facility]) 12/2009   Left UOQ breast tumor; wide excision,sn bx and whole breast radiation. Chemotherapy done. There was only a 45m area of residual tumor remaining. All sn were negative. This was ER-positive, PR-negative, HER-2 neu not over expressed tumor. The original ultrasound size was 2.6 cm(T2).  . H/O cystitis 2011  . Malignant neoplasm of upper-outer quadrant of female breast ([facility] January 13, 2010   2+ centimeter tumor, neoadjuvant chemotherapy. On wide excision 3 mm area of residual tumor. Sentinel node negative. ER-30%, PR-negative, HER-2 not overexpressing.  . Personal history of fibromyalgia 2002  . Personal history of malignant neoplasm of large intestine   . Special screening for malignant neoplasms, colon   . Unspecified constipation     Social History   Social History  . Marital status: Married    Spouse name: N/A  . Number of children: N/A  . Years of education: N/A   Occupational History  . Not on file.   Social History Main Topics  . Smoking status: Never Smoker  . Smokeless tobacco: Never Used  . Alcohol use No  . Drug use: No  . Sexual activity: No   Other Topics Concern  . Not on file   Social History Narrative  . No narrative on file    Outpatient Encounter Prescriptions as of 03/03/2016  Medication Sig Note  . ALPRAZolam (XANAX) 0.5  MG tablet TAKE ONE TABLET BY MOUTH EVERY 4 HOURS AS NEEDED   . aspirin-acetaminophen-caffeine (EXCEDRIN MIGRAINE) 250-250-65 MG tablet Take by mouth. Reported on 06/19/2015 04/24/2015: Medication taken as needed.  Received from: [facility]  . omeprazole (PRILOSEC) 20 MG capsule Take by mouth. Reported on 07/22/2015 04/24/2015: Received from: [facility]  . polyethylene glycol (MIRALAX / GLYCOLAX) packet Take 17 g by mouth daily.   . promethazine (PHENERGAN) 25 MG tablet Take 1 tablet (25 mg  total) by mouth every 8 (eight) hours as needed for nausea or vomiting.   . [DISCONTINUED] cyclobenzaprine (FLEXERIL) 10 MG tablet Take 10 mg by mouth as needed for muscle spasms.   . [DISCONTINUED] traMADol (ULTRAM) 50 MG tablet Take 1 tablet (50 mg total) by mouth every 4 (four) hours as needed.    No facility-administered encounter medications on file as of 03/03/2016.     Allergies  Allergen Reactions  . Alendronate Nausea Only    Gi symptoms  . Gluten Meal Nausea Only    Other reaction(s): NAUSEA Other reaction(s): NAUSEA Other reaction(s): NAUSEA  . Lac Bovis Nausea And Vomiting  . Milk-Related Compounds   . Nsaids     GI upset.  . Other Other (See Comments)    Other reaction(s): OTHER  . Oxycodone-Acetaminophen   . Oxycodone-Acetaminophen Other (See Comments)    Other reaction(s): NAUSEA  . Vicodin [Hydrocodone-Acetaminophen] Nausea Only, Diarrhea and Nausea And Vomiting  . Wheat Bran Other (See Comments)  . Sulfa Antibiotics Rash    Rash from steri strips  . Tape Rash    Rash from steri strips    Review of Systems  Constitutional: Positive for malaise/fatigue. Negative for fever.  Eyes: Negative.   Respiratory: Positive for cough (dry). Negative for shortness of breath and wheezing.   Cardiovascular: Positive for leg swelling. Negative for chest pain, palpitations and orthopnea.  Gastrointestinal: Positive for nausea.  Musculoskeletal: Positive for joint pain and myalgias. Negative for back pain, falls and neck pain.  Skin: Negative.   Neurological: Positive for weakness. Negative for dizziness and headaches.  Endo/Heme/Allergies: Negative.     Objective:  BP 122/80 (BP Location: Right Arm, Patient Position: Sitting, Cuff Size: Large)   Pulse 78   Temp 97.9 F (36.6 C) (Oral)   Resp 16   Physical Exam  Constitutional: She is oriented to person, place, and time and well-developed, well-nourished, and in no distress.  HENT:  Head: Normocephalic and atraumatic.   Right Ear: External ear normal.  Left Ear: External ear normal.  Nose: Nose normal.  Eyes: Conjunctivae are normal. No scleral icterus.  Neck: No thyromegaly present.  Cardiovascular: Normal rate, regular rhythm and normal heart sounds.   Pulmonary/Chest: Effort normal and breath sounds normal.  Abdominal: Soft.  Neurological: She is alert and oriented to person, place, and time.  Pt sitting in wheelchair.No cogwheeling but possible masked facies.  Skin: Skin is warm and dry.  Psychiatric: Mood, memory, affect and judgment normal.    Assessment and Plan :  OA of both hips Ostoporotic Fractures of pelvis Myalgia--could stiffness be neurologic Chronic Anxiety More than 25 minutes spent in counselling. Check labs. I have done the exam and reviewed the chart and it is accurate to the best of my knowledge. Development worker, community has been used and  any errors in dictation or transcription are unintentional. Miguel Aschoff M.D. [facility]

## 2016-03-04 LAB — SEDIMENTATION RATE: Sed Rate: 15 mm/hr (ref 0–40)

## 2016-03-04 LAB — TSH: TSH: 0.956 u[IU]/mL (ref 0.450–4.500)

## 2016-03-04 LAB — CK: Total CK: 48 U/L (ref 24–173)

## 2016-03-08 ENCOUNTER — Telehealth: Payer: Self-pay

## 2016-03-08 NOTE — Telephone Encounter (Signed)
Patients husband has been advised. KW

## 2016-03-08 NOTE — Telephone Encounter (Signed)
-----   Message from Jerrol Banana., MD sent at 03/07/2016  5:01 PM EST ----- Labs WNL.

## 2016-03-15 ENCOUNTER — Other Ambulatory Visit: Payer: Self-pay

## 2016-03-15 ENCOUNTER — Telehealth: Payer: Self-pay | Admitting: Family Medicine

## 2016-03-15 DIAGNOSIS — M160 Bilateral primary osteoarthritis of hip: Secondary | ICD-10-CM

## 2016-03-15 DIAGNOSIS — M810 Age-related osteoporosis without current pathological fracture: Secondary | ICD-10-CM

## 2016-03-15 NOTE — Telephone Encounter (Signed)
Pt is requesting an Rx for the medication that another provider started her on for osteoporosis be sent to [place] since she is on the nausea medication. Pt stated that Dr. Rosanna Randy would know what she was talking about. Please advise. Thanks TNP

## 2016-03-15 NOTE — Telephone Encounter (Signed)
Pt is requesting to get physical therapy in her home.If you approve please place order for home health care for services needed,Thanks

## 2016-03-15 NOTE — Telephone Encounter (Signed)
Dr Darnell Level are you aware of this and do you want to refill medicine? ED

## 2016-03-16 ENCOUNTER — Telehealth: Payer: Self-pay

## 2016-03-16 NOTE — Telephone Encounter (Signed)
Patient called office today requesting a refill on her Osteoporosis medication. Patient reports that Jiles Garter knows name of medication, she was not aware name of medication. Patient request a call back from nurse when available. KW

## 2016-03-16 NOTE — Telephone Encounter (Signed)
LMTCB ED 

## 2016-03-16 NOTE — Telephone Encounter (Signed)
Patient states the pharmacy was going to call the MD that prescribed it.  ED

## 2016-03-16 NOTE — Telephone Encounter (Signed)
Patient is going to check with pharmacy on who the prescribing physician is.  ED

## 2016-03-16 NOTE — Telephone Encounter (Signed)
I do not remember anything about this.

## 2016-03-18 DIAGNOSIS — M160 Bilateral primary osteoarthritis of hip: Secondary | ICD-10-CM | POA: Diagnosis not present

## 2016-03-18 DIAGNOSIS — M810 Age-related osteoporosis without current pathological fracture: Secondary | ICD-10-CM | POA: Diagnosis not present

## 2016-03-21 DIAGNOSIS — M810 Age-related osteoporosis without current pathological fracture: Secondary | ICD-10-CM | POA: Diagnosis not present

## 2016-03-21 DIAGNOSIS — M160 Bilateral primary osteoarthritis of hip: Secondary | ICD-10-CM | POA: Diagnosis not present

## 2016-03-23 DIAGNOSIS — M160 Bilateral primary osteoarthritis of hip: Secondary | ICD-10-CM | POA: Diagnosis not present

## 2016-03-23 DIAGNOSIS — M810 Age-related osteoporosis without current pathological fracture: Secondary | ICD-10-CM | POA: Diagnosis not present

## 2016-03-28 DIAGNOSIS — M810 Age-related osteoporosis without current pathological fracture: Secondary | ICD-10-CM | POA: Diagnosis not present

## 2016-03-28 DIAGNOSIS — M160 Bilateral primary osteoarthritis of hip: Secondary | ICD-10-CM | POA: Diagnosis not present

## 2016-03-31 ENCOUNTER — Ambulatory Visit (INDEPENDENT_AMBULATORY_CARE_PROVIDER_SITE_OTHER): Payer: Medicare Other | Admitting: Family Medicine

## 2016-03-31 VITALS — BP 148/92 | HR 102 | Temp 98.7°F | Resp 16 | Wt 210.0 lb

## 2016-03-31 DIAGNOSIS — F331 Major depressive disorder, recurrent, moderate: Secondary | ICD-10-CM

## 2016-03-31 DIAGNOSIS — R251 Tremor, unspecified: Secondary | ICD-10-CM

## 2016-03-31 DIAGNOSIS — M797 Fibromyalgia: Secondary | ICD-10-CM

## 2016-03-31 DIAGNOSIS — R300 Dysuria: Secondary | ICD-10-CM | POA: Diagnosis not present

## 2016-03-31 DIAGNOSIS — K589 Irritable bowel syndrome without diarrhea: Secondary | ICD-10-CM | POA: Diagnosis not present

## 2016-03-31 DIAGNOSIS — M05752 Rheumatoid arthritis with rheumatoid factor of left hip without organ or systems involvement: Secondary | ICD-10-CM

## 2016-03-31 DIAGNOSIS — R110 Nausea: Secondary | ICD-10-CM | POA: Diagnosis not present

## 2016-03-31 DIAGNOSIS — M05751 Rheumatoid arthritis with rheumatoid factor of right hip without organ or systems involvement: Secondary | ICD-10-CM | POA: Diagnosis not present

## 2016-03-31 DIAGNOSIS — M158 Other polyosteoarthritis: Secondary | ICD-10-CM

## 2016-03-31 MED ORDER — MIRTAZAPINE 30 MG PO TABS: 30.0000 mg | ORAL_TABLET | Freq: Every day | ORAL | 6 refills | Status: DC

## 2016-03-31 NOTE — Progress Notes (Signed)
Terri Perez  MRN: [id number] DOB: 1945-07-20  Subjective:  HPI  Patient is here for 1 month follow up. Patient advised on her last visit to try and take Nortriptyline at bedtime for 2 weeks and then to take 2 tablets. Patient states she took 1 tablet for 10 days and took 2 tablets maybe for a few days after that and did not notice any improvement so she stopped. She states Reglan is helping digestive issues, she is taking this 4 times daily. She could not take Coq10 she tried twice and it made her upset on her stomach. Patient states she is having stiffness all over her body, she is not mobile much, husband states this has been noted for the past 6 weeks. She is unable to ambulate or do any ADLs on her own, has to have assistance. Physical therapy has come out about 4 to 5 times since last visit a month ago. Patient would like to discuss starting anti depressant.  Patient Active Problem List   Diagnosis Date Noted  . Bilateral lower extremity edema 07/23/2015  . 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 Chronic kidney disease (CKD), stage III (moderate) 05/11/2015  . Elevated rheumatoid factor 05/11/2015  . Primary osteoarthritis of both hips 05/11/2015  . Episode of syncope 04/24/2015  . History of operative procedure on hip 04/24/2015  . Restless leg 04/24/2015  . Osteoporosis 03/30/2015  . Insomnia 03/30/2015  . Skin cancer 03/30/2015  . MDD (major depressive disorder) 03/30/2015  . Migraine 03/30/2015  . GERD (gastroesophageal reflux disease) 03/30/2015  . IBS (irritable bowel syndrome) 03/30/2015  . OA (osteoarthritis) 03/30/2015  . Skin lesion of chest wall 07/17/2014  . Closed fracture of shaft of femur ([place]) 04/01/2014  . Fracture of bone adjacent to prosthesis 04/01/2014  . Breast cancer ([place]) 03/07/2013  . Personal history of malignant neoplasm of large intestine   . Malignant neoplasm of upper-outer quadrant of female breast ([place])   . Acute anxiety 06/11/2008    . CONSTIPATION, CHRONIC 06/11/2008  . Fibromyalgia 06/11/2008    Past Medical History:  Diagnosis Date  . Arm fracture    right forearm, d/t fall  . Bowel trouble 2010  . Breast cancer ([place]) 12/2009   LEFT LUMPECTOMY, chemo and radiation  . Cancer [facility]) 12/2009   Left UOQ breast tumor; wide excision,sn bx and whole breast radiation. Chemotherapy done. There was only a 33m area of residual tumor remaining. All sn were negative. This was ER-positive, PR-negative, HER-2 neu not over expressed tumor. The original ultrasound size was 2.6 cm(T2).  . H/O cystitis 2011  . Malignant neoplasm of upper-outer quadrant of female breast ([facility] January 13, 2010   2+ centimeter tumor, neoadjuvant chemotherapy. On wide excision 3 mm area of residual tumor. Sentinel node negative. ER-30%, PR-negative, HER-2 not overexpressing.  . Personal history of fibromyalgia 2002  . Personal history of malignant neoplasm of large intestine   . Special screening for malignant neoplasms, colon   . Unspecified constipation     Social History   Social History  . Marital status: Married    Spouse name: N/A  . Number of children: N/A  . Years of education: N/A   Occupational History  . Not on file.   Social History Main Topics  . Smoking status: Never Smoker  . Smokeless tobacco: Never Used  . Alcohol use No  . Drug use: No  . Sexual activity: No   Other Topics Concern  . Not on file  Social History Narrative  . No narrative on file    Outpatient Encounter Prescriptions as of 03/31/2016  Medication Sig Note  . ALPRAZolam (XANAX) 0.5 MG tablet TAKE ONE TABLET BY MOUTH EVERY 4 HOURS AS NEEDED   . aspirin-acetaminophen-caffeine (EXCEDRIN MIGRAINE) 250-250-65 MG tablet Take by mouth. Reported on 06/19/2015 04/24/2015: Medication taken as needed.  Received from: [facility]  . metoCLOPramide (REGLAN) 10 MG tablet Take 1 tablet (10 mg total) by mouth 4 (four) times daily.   Marland Kitchen omeprazole  (PRILOSEC) 20 MG capsule Take by mouth. Reported on 07/22/2015 04/24/2015: Received from: [facility]  . polyethylene glycol (MIRALAX / GLYCOLAX) packet Take 17 g by mouth daily.   . promethazine (PHENERGAN) 25 MG tablet Take 1 tablet (25 mg total) by mouth every 8 (eight) hours as needed for nausea or vomiting.   . [DISCONTINUED] nortriptyline (PAMELOR) 10 MG capsule Take 1 capsule (10 mg total) by mouth at bedtime. Take 1 at bedtime for 2 weeks and then 2 at bedtime    No facility-administered encounter medications on file as of 03/31/2016.     Allergies  Allergen Reactions  . Alendronate Nausea Only    Gi symptoms  . Gluten Meal Nausea Only    Other reaction(s): NAUSEA Other reaction(s): NAUSEA Other reaction(s): NAUSEA  . Lac Bovis Nausea And Vomiting  . Milk-Related Compounds   . Nsaids     GI upset.  . Other Other (See Comments)    Other reaction(s): OTHER  . Oxycodone-Acetaminophen   . Oxycodone-Acetaminophen Other (See Comments)    Other reaction(s): NAUSEA  . Vicodin [Hydrocodone-Acetaminophen] Nausea Only, Diarrhea and Nausea And Vomiting  . Wheat Bran Other (See Comments)  . Sulfa Antibiotics Rash    Rash from steri strips  . Tape Rash    Rash from steri strips    Review of Systems  Constitutional: Positive for malaise/fatigue.  Respiratory: Negative.   Cardiovascular: Positive for leg swelling.  Gastrointestinal: Positive for abdominal pain and constipation.  Genitourinary: Positive for frequency and urgency.  Musculoskeletal: Positive for back pain, joint pain, myalgias and neck pain.       Unsteady gait, joint stiffness all over. Motor skills decreased.  Neurological: Positive for dizziness, tremors and weakness (legs, all over. not ambulating by herself, has to have assistance.).  Psychiatric/Behavioral: Positive for depression. Negative for hallucinations and suicidal ideas. The patient is nervous/anxious and has insomnia.     Objective:  BP  (!) 148/92   Pulse (!) 102   Temp 98.7 F (37.1 C)   Resp 16   Wt 210 lb (95.3 kg) Comment: per patient  BMI 31.01 kg/m   Physical Exam  Constitutional: She is oriented to person, place, and time and well-developed, well-nourished, and in no distress.  HENT:  Head: Normocephalic and atraumatic.  Right Ear: External ear normal.  Left Ear: External ear normal.  Nose: Nose normal.  Eyes: Conjunctivae are normal. Pupils are equal, round, and reactive to light.  Cardiovascular: Normal rate, regular rhythm, normal heart sounds and intact distal pulses.   No murmur heard. Pulmonary/Chest: Effort normal and breath sounds normal. No respiratory distress. She has no wheezes.  Abdominal: Soft.  Musculoskeletal: She exhibits edema.  Neurological: She is alert and oriented to person, place, and time.  Masked facies. Cogwheeling noted. Some trouble initiating gait.  Skin: Skin is warm and dry.  Psychiatric: Mood, memory, affect and judgment normal.    Assessment and Plan :  1. Tremor Will refer to  neurologist. Possibly Parkinson's. Can definitely notice change in the patient, her movement, her motor skills.  2. Fibromyalgia In pain all the time. Physical therapy is coming out to her home.  3. Nausea Using Phenergan as needed. Insurance is not covering this but patient is paying out of pocket and this medication helps patient.  4. 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 Irritable bowel syndrome, unspecified type Symptoms better on Reglan.  6. Other osteoarthritis involving multiple joints 7. Dysuria Get urine culture. Patient will collect sample at home and bring it in to be sent to culture. Discussed that may need to get referral to urology done. Advised patient to try and do the kiggle exercises. Follow for now. 8.OA 9.Severe Chronic Anxiety   HPI, Exam and A&P transcribed under direction and in the presence of Miguel Aschoff, MD. I  have done the exam and reviewed the chart and it is accurate to the best of my knowledge. Development worker, community has been used and  any errors in dictation or transcription are unintentional. Miguel Aschoff M.D. [facility]

## 2016-04-01 DIAGNOSIS — M810 Age-related osteoporosis without current pathological fracture: Secondary | ICD-10-CM | POA: Diagnosis not present

## 2016-04-01 DIAGNOSIS — M160 Bilateral primary osteoarthritis of hip: Secondary | ICD-10-CM | POA: Diagnosis not present

## 2016-04-06 DIAGNOSIS — Z96641 Presence of right artificial hip joint: Secondary | ICD-10-CM | POA: Diagnosis not present

## 2016-04-06 DIAGNOSIS — Z886 Allergy status to analgesic agent status: Secondary | ICD-10-CM | POA: Diagnosis not present

## 2016-04-06 DIAGNOSIS — Z885 Allergy status to narcotic agent status: Secondary | ICD-10-CM | POA: Diagnosis not present

## 2016-04-06 DIAGNOSIS — Z471 Aftercare following joint replacement surgery: Secondary | ICD-10-CM | POA: Diagnosis not present

## 2016-04-07 DIAGNOSIS — M810 Age-related osteoporosis without current pathological fracture: Secondary | ICD-10-CM | POA: Diagnosis not present

## 2016-04-07 DIAGNOSIS — M160 Bilateral primary osteoarthritis of hip: Secondary | ICD-10-CM | POA: Diagnosis not present

## 2016-04-11 ENCOUNTER — Telehealth: Payer: Self-pay

## 2016-04-11 NOTE — Telephone Encounter (Signed)
Ok times 5

## 2016-04-11 NOTE — Telephone Encounter (Signed)
Patient is requesting medication refill on the following medication: traMADol (ULTRAM) 50 MG tablet  She reports that Medication is expired. [place],  Thanks,  -Kelvyn Schunk

## 2016-04-11 NOTE — Telephone Encounter (Signed)
Per our record last time filled 09/01/15-aa

## 2016-04-12 MED ORDER — TRAMADOL HCL 50 MG PO TABS: 50.0000 mg | ORAL_TABLET | Freq: Four times a day (QID) | ORAL | 3 refills | Status: DC | PRN

## 2016-04-12 NOTE — Telephone Encounter (Signed)
RX called in to the pharmacy-aa

## 2016-04-19 ENCOUNTER — Other Ambulatory Visit: Payer: Self-pay | Admitting: Emergency Medicine

## 2016-04-19 DIAGNOSIS — F419 Anxiety disorder, unspecified: Secondary | ICD-10-CM

## 2016-04-19 MED ORDER — ALPRAZOLAM 0.5 MG PO TABS: 0.5000 mg | ORAL_TABLET | ORAL | 5 refills | Status: DC | PRN

## 2016-04-19 NOTE — Progress Notes (Signed)
Per verbal order.

## 2016-04-24 DIAGNOSIS — G20 Parkinson's disease: Secondary | ICD-10-CM

## 2016-04-24 DIAGNOSIS — G20A1 Parkinson's disease without dyskinesia, without mention of fluctuations: Secondary | ICD-10-CM

## 2016-04-24 HISTORY — DX: Parkinson's disease: G20

## 2016-04-24 HISTORY — DX: Parkinson's disease without dyskinesia, without mention of fluctuations: G20.A1

## 2016-04-25 DIAGNOSIS — G4752 REM sleep behavior disorder: Secondary | ICD-10-CM | POA: Insufficient documentation

## 2016-04-25 DIAGNOSIS — G26 Extrapyramidal and movement disorders in diseases classified elsewhere: Secondary | ICD-10-CM | POA: Diagnosis not present

## 2016-04-25 DIAGNOSIS — G2581 Restless legs syndrome: Secondary | ICD-10-CM | POA: Diagnosis not present

## 2016-04-25 DIAGNOSIS — Z6830 Body mass index (BMI) 30.0-30.9, adult: Secondary | ICD-10-CM | POA: Diagnosis not present

## 2016-04-25 DIAGNOSIS — Z8659 Personal history of other mental and behavioral disorders: Secondary | ICD-10-CM | POA: Diagnosis not present

## 2016-04-25 DIAGNOSIS — E6609 Other obesity due to excess calories: Secondary | ICD-10-CM | POA: Diagnosis not present

## 2016-04-25 DIAGNOSIS — G20 Parkinson's disease: Secondary | ICD-10-CM | POA: Diagnosis not present

## 2016-04-25 DIAGNOSIS — M810 Age-related osteoporosis without current pathological fracture: Secondary | ICD-10-CM | POA: Diagnosis not present

## 2016-04-26 ENCOUNTER — Other Ambulatory Visit: Payer: Self-pay | Admitting: Nurse Practitioner

## 2016-04-26 DIAGNOSIS — G20 Parkinson's disease: Secondary | ICD-10-CM

## 2016-04-26 DIAGNOSIS — G4752 REM sleep behavior disorder: Secondary | ICD-10-CM

## 2016-04-26 DIAGNOSIS — G20C Parkinsonism, unspecified: Secondary | ICD-10-CM

## 2016-04-27 ENCOUNTER — Encounter: Payer: Self-pay | Admitting: Physician Assistant

## 2016-04-27 ENCOUNTER — Ambulatory Visit (INDEPENDENT_AMBULATORY_CARE_PROVIDER_SITE_OTHER): Payer: Medicare Other | Admitting: Physician Assistant

## 2016-04-27 VITALS — BP 108/72 | HR 100 | Temp 99.1°F | Resp 16

## 2016-04-27 DIAGNOSIS — N898 Other specified noninflammatory disorders of vagina: Secondary | ICD-10-CM

## 2016-04-27 DIAGNOSIS — R300 Dysuria: Secondary | ICD-10-CM | POA: Diagnosis not present

## 2016-04-27 MED ORDER — MICONAZOLE NITRATE 4 % VA CREA
1.0000 | TOPICAL_CREAM | Freq: Every day | VAGINAL | 0 refills | Status: DC
Start: 2016-04-27 — End: 2016-05-05

## 2016-04-27 MED ORDER — DOXYCYCLINE HYCLATE 100 MG PO TABS: 100.0000 mg | ORAL_TABLET | Freq: Two times a day (BID) | ORAL | 0 refills | Status: AC

## 2016-04-27 NOTE — Patient Instructions (Signed)

## 2016-04-27 NOTE — Progress Notes (Signed)
Patient: Terri Perez Female    DOB: June 29, 1945   71 y.o.   MRN: [id number] Visit Date: 04/27/2016  Today's Provider: Trinna Post, PA-C   Chief Complaint  Patient presents with  . Urinary Tract Infection   Subjective:      Patient with history of Parkinson's presenting today with frequency, bladder pressure concerned for UTI.   Urinary Tract Infection   This is a new problem. The current episode started yesterday. Progression since onset: improving with AZO. Quality: sharp. The pain is severe. Associated symptoms include a discharge (white; pt concerned of yeast infection), frequency, hesitancy, nausea, urgency and vomiting. Pertinent negatives include no chills, flank pain, hematuria or sweats. Associated symptoms comments: weakness. Treatments tried: AZO. The treatment provided moderate relief.       Allergies  Allergen Reactions  . Alendronate Nausea Only    Gi symptoms  . Gluten Meal Nausea Only    Other reaction(s): NAUSEA Other reaction(s): NAUSEA Other reaction(s): NAUSEA  . Lac Bovis Nausea And Vomiting  . Milk-Related Compounds   . Nsaids     GI upset.  . Other Other (See Comments)    Other reaction(s): OTHER  . Oxycodone-Acetaminophen   . Oxycodone-Acetaminophen Other (See Comments)    Other reaction(s): NAUSEA  . Vicodin [Hydrocodone-Acetaminophen] Nausea Only, Diarrhea and Nausea And Vomiting  . Wheat Bran Other (See Comments)  . Sulfa Antibiotics Rash    Rash from steri strips  . Tape Rash    Rash from steri strips     Current Outpatient Prescriptions:  .  ALPRAZolam (XANAX) 0.5 MG tablet, Take 1 tablet (0.5 mg total) by mouth every 4 (four) hours as needed., Disp: 90 tablet, Rfl: 5 .  aspirin-acetaminophen-caffeine (EXCEDRIN MIGRAINE) 250-250-65 MG tablet, Take by mouth. Reported on 06/19/2015, Disp: , Rfl:  .  carbidopa-levodopa (SINEMET IR) 25-100 MG tablet, Take 1 tablet by mouth 3 (three) times daily., Disp: , Rfl:  .  mirtazapine  (REMERON) 30 MG tablet, Take 1 tablet (30 mg total) by mouth at bedtime., Disp: 30 tablet, Rfl: 6 .  omeprazole (PRILOSEC) 20 MG capsule, Take by mouth. Reported on 07/22/2015, Disp: , Rfl:  .  polyethylene glycol (MIRALAX / GLYCOLAX) packet, Take 17 g by mouth daily., Disp: , Rfl:  .  promethazine (PHENERGAN) 25 MG tablet, Take 1 tablet (25 mg total) by mouth every 8 (eight) hours as needed for nausea or vomiting., Disp: 30 tablet, Rfl: 0 .  traMADol (ULTRAM) 50 MG tablet, Take 1 tablet (50 mg total) by mouth every 6 (six) hours as needed., Disp: 100 tablet, Rfl: 3  Review of Systems  Constitutional: Positive for appetite change. Negative for chills.  Gastrointestinal: Positive for nausea and vomiting.  Genitourinary: Positive for frequency, hesitancy and urgency. Negative for flank pain and hematuria.    Social History  Substance Use Topics  . Smoking status: Never Smoker  . Smokeless tobacco: Never Used  . Alcohol use No   Objective:   BP 108/72 (BP Location: Right Arm, Patient Position: Sitting, Cuff Size: Large)   Pulse 100   Temp 99.1 F (37.3 C) (Oral)   Resp 16  Vitals:   04/27/16 1132  BP: 108/72  Pulse: 100  Resp: 16  Temp: 99.1 F (37.3 C)  TempSrc: Oral     Physical Exam  Constitutional: She is oriented to person, place, and time. She appears well-developed and well-nourished. No distress.  Cardiovascular: Normal rate and regular rhythm.  Pulmonary/Chest: Effort normal and breath sounds normal.  Abdominal: Soft. Bowel sounds are normal. She exhibits no distension. There is tenderness in the suprapubic area. There is no rebound, no guarding and no CVA tenderness.  Neurological: She is alert and oriented to person, place, and time.  Skin: Skin is warm and dry. She is not diaphoretic.  Psychiatric: She has a normal mood and affect. Her behavior is normal.        Assessment & Plan:     1. Dysuria  Unable to do dipstick 2/2 AZO. Will send for cx and cover  until then.   - doxycycline (VIBRA-TABS) 100 MG tablet; Take 1 tablet (100 mg total) by mouth 2 (two) times daily.  Dispense: 14 tablet; Refill: 0 - CULTURE, URINE COMPREHENSIVE  2. Vaginal discharge  Provided script in case of yeast infection.   - MICONAZOLE NITRATE VAGINAL (MICONAZOLE 3) 4 % CREA; Place 1 applicator vaginally at bedtime.  Dispense: 25 g; Refill: 0      The entirety of the information documented in the History of Present Illness, Review of Systems and Physical Exam were personally obtained by me. Portions of this information were initially documented by Raquel Sarna D and reviewed by me for thoroughness and accuracy.    Return if symptoms worsen or fail to improve.    Trinna Post, PA-C  [facility]

## 2016-05-02 LAB — CULTURE, URINE COMPREHENSIVE

## 2016-05-03 ENCOUNTER — Telehealth: Payer: Self-pay

## 2016-05-03 NOTE — Telephone Encounter (Signed)
LMTCB 05/03/2016  Thanks,   -[place] Geffre  

## 2016-05-03 NOTE — Telephone Encounter (Signed)
Pt husband is returning call.  CB#[phone]/MW

## 2016-05-03 NOTE — Telephone Encounter (Signed)
Advised  ED 

## 2016-05-03 NOTE — Telephone Encounter (Signed)
-----   Message from Trinna Post, [place] sent at 05/02/2016  4:57 PM EDT ----- Culture showed UTI sensitive to doxycycline. Hopefully patient is feeling better.

## 2016-05-05 ENCOUNTER — Ambulatory Visit (INDEPENDENT_AMBULATORY_CARE_PROVIDER_SITE_OTHER): Payer: Medicare Other | Admitting: Family Medicine

## 2016-05-05 ENCOUNTER — Encounter: Payer: Self-pay | Admitting: Family Medicine

## 2016-05-05 ENCOUNTER — Other Ambulatory Visit: Payer: Self-pay

## 2016-05-05 VITALS — BP 110/70 | HR 72 | Temp 98.3°F | Resp 16

## 2016-05-05 DIAGNOSIS — G20A1 Parkinson's disease without dyskinesia, without mention of fluctuations: Secondary | ICD-10-CM

## 2016-05-05 DIAGNOSIS — R110 Nausea: Secondary | ICD-10-CM

## 2016-05-05 DIAGNOSIS — E669 Obesity, unspecified: Secondary | ICD-10-CM

## 2016-05-05 DIAGNOSIS — F331 Major depressive disorder, recurrent, moderate: Secondary | ICD-10-CM

## 2016-05-05 DIAGNOSIS — C50412 Malignant neoplasm of upper-outer quadrant of left female breast: Secondary | ICD-10-CM

## 2016-05-05 DIAGNOSIS — G20 Parkinson's disease: Secondary | ICD-10-CM | POA: Diagnosis not present

## 2016-05-05 DIAGNOSIS — R5383 Other fatigue: Secondary | ICD-10-CM

## 2016-05-05 DIAGNOSIS — M810 Age-related osteoporosis without current pathological fracture: Secondary | ICD-10-CM

## 2016-05-05 MED ORDER — PROMETHAZINE HCL 25 MG PO TABS: 12.5000 mg | ORAL_TABLET | Freq: Four times a day (QID) | ORAL | 3 refills | Status: DC | PRN

## 2016-05-05 NOTE — Progress Notes (Signed)
Patient: Terri Perez Female    DOB: [birth year]   71 y.o.   MRN: [id number] Visit Date: 05/05/2016  Today's Provider: [name], MD   Chief Complaint  Patient presents with  . Tremors  . Depression   Subjective:    HPI   Parkinson's Pt was seen on 03/31/2016 for tremors. Pt was referred to neurology. Pt was diagnosed with Parkinson's on 04/25/2016 by Rufina Falco. Pt was started on Sinemet 25/100 mg 1 tablet TID, as well as Phenergan for nausea. The Sinemet has been increased to 1.5 tabs TID. Neuro would like PCP to prescribe Phenergan. Pt was prescribed Phenergan 25 mg 0.5 tablet (12.5 mg) po q 6 hours prn nausea.   Depression, Follow-up  She  was last seen for this 1 months ago. PHQ 9 was 17 at that time.   Pt is taking Xanax for anxiety.  Current symptoms include: difficulty concentrating, fatigue and hypersomnia She feels she is Improved since last visit.  Depression screen [facility] 2/9 05/05/2016 03/31/2016 09/01/2015  Decreased Interest 0 0 2  Down, Depressed, Hopeless 1 3 1   PHQ - 2 Score 1 3 3   Altered sleeping 3 2 3   Tired, decreased energy 3 3 3   Change in appetite 0 0 0  Feeling bad or failure about yourself  0 3 0  Trouble concentrating 2 3 1   Moving slowly or fidgety/restless - 3 2  Suicidal thoughts 2 0 0  PHQ-9 Score 11 17 12   Difficult doing work/chores - - Not difficult at all   ------------------------------------------------------------------------  Per insurance, pt needs a BMI plan. Pt is unable to be weighed due to being wheelchair bound from Parkinson's. Pt has well balanced diet.  Allergies  Allergen Reactions  . Alendronate Nausea Only    Gi symptoms  . Gluten Meal Nausea Only    Other reaction(s): NAUSEA Other reaction(s): NAUSEA Other reaction(s): NAUSEA  . Lac Bovis Nausea And Vomiting  . Milk-Related Compounds   . Nsaids     GI upset.  . Other Other (See Comments)    Other reaction(s): OTHER  .  Oxycodone-Acetaminophen   . Oxycodone-Acetaminophen Other (See Comments)    Other reaction(s): NAUSEA  . Vicodin [Hydrocodone-Acetaminophen] Nausea Only, Diarrhea and Nausea And Vomiting  . Wheat Bran Other (See Comments)  . Sulfa Antibiotics Rash    Rash from steri strips  . Tape Rash    Rash from steri strips     Current Outpatient Prescriptions:  .  ALPRAZolam (XANAX) 0.5 MG tablet, Take 1 tablet (0.5 mg total) by mouth every 4 (four) hours as needed., Disp: 90 tablet, Rfl: 5 .  aspirin-acetaminophen-caffeine (EXCEDRIN MIGRAINE) 250-250-65 MG tablet, Take by mouth. Reported on 06/19/2015, Disp: , Rfl:  .  carbidopa-levodopa (SINEMET IR) 25-100 MG tablet, Take by mouth 3 (three) times daily. Take 1 1/2 tablets po TID, Disp: , Rfl:  .  mirtazapine (REMERON) 30 MG tablet, Take 1 tablet (30 mg total) by mouth at bedtime., Disp: 30 tablet, Rfl: 6 .  omeprazole (PRILOSEC) 20 MG capsule, Take by mouth. Reported on 07/22/2015, Disp: , Rfl:  .  polyethylene glycol (MIRALAX / GLYCOLAX) packet, Take 17 g by mouth daily., Disp: , Rfl:  .  promethazine (PHENERGAN) 25 MG tablet, Take 1 tablet (25 mg total) by mouth every 8 (eight) hours as needed for nausea or vomiting. (Patient taking differently: Take 12.5 mg by mouth every 6 (six) hours as needed for nausea or vomiting. ),  Disp: 30 tablet, Rfl: 0 .  traMADol (ULTRAM) 50 MG tablet, Take 1 tablet (50 mg total) by mouth every 6 (six) hours as needed., Disp: 100 tablet, Rfl: 3 .  Vitamin D, Ergocalciferol, (DRISDOL) 50000 units CAPS capsule, TAKE 1 CAPSULE (50,000 UNITS TOTAL) BY MOUTH ONCE A WEEK FOR 8 DOSES., Disp: , Rfl: 0  Review of Systems  Constitutional: Positive for chills and fatigue. Negative for appetite change, diaphoresis and unexpected weight change.  Respiratory: Negative for shortness of breath.   Cardiovascular: Positive for leg swelling (lymphedema). Negative for chest pain and palpitations.    Social History  Substance Use Topics    . Smoking status: Never Smoker  . Smokeless tobacco: Never Used  . Alcohol use No   Objective:   BP 110/70 (BP Location: Right Arm, Patient Position: Sitting, Cuff Size: Large)   Pulse 72   Temp 98.3 F (36.8 C) (Oral)   Resp 16  Vitals:   05/05/16 1449  BP: 110/70  Pulse: 72  Resp: 16  Temp: 98.3 F (36.8 C)  TempSrc: Oral     Physical Exam  Constitutional: She is oriented to person, place, and time. She appears well-developed and well-nourished.  HENT:  Head: Normocephalic and atraumatic.  Right Ear: External ear normal.  Left Ear: External ear normal.  Nose: Nose normal.  Eyes: Conjunctivae are normal.  Cardiovascular: Normal rate, regular rhythm and normal heart sounds.   Pulmonary/Chest: Effort normal and breath sounds normal. No respiratory distress.  Abdominal: Soft.  Musculoskeletal:  Pt sitting in wheelchair.   Neurological: She is alert and oriented to person, place, and time.  Masked facies.  Skin: Skin is warm and dry.  Psychiatric: She has a normal mood and affect. Her behavior is normal.        Assessment & Plan:     1. Parkinson disease ([place]) Continue to follow up with neurology. Will fill promethazine as needed. More than 50% of 25 minute visit spent counselling pt/husband /daughter. 2. Moderate episode of recurrent major depressive disorder (HCC) Improving. Main complaint is fatigue from lack of sleep. Will follow.  3. Obesity without serious comorbidity, unspecified classification, unspecified obesity type Eating healthy. Unable to weigh due to wheelchair. Unable to exercise due to Parkinson's. Continue to eat healthy.  4. Fatigue, unspecified type Caused by insomnia from restlessness. Encouraged pt to call neurology to adjust medication so pt will be able to sleep better.  5. Nausea Refills provided. - promethazine (PHENERGAN) 25 MG tablet; Take 0.5 tablets (12.5 mg total) by mouth every 6 (six) hours as needed for nausea or vomiting.   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 May need to consider to infusions after next BMD at the end of this year.   May need Endocrine referral. Patient seen and examined by Miguel Aschoff, MD, and note scribed by Renaldo Fiddler, CMA.  Guila Owensby Cranford Mon, MD  [facility]

## 2016-05-06 ENCOUNTER — Ambulatory Visit
Admission: RE | Admit: 2016-05-06 | Discharge: 2016-05-06 | Disposition: A | Discharge status: Home / Self Care | Payer: Medicare Other | Source: Ambulatory Visit | Attending: Nurse Practitioner | Admitting: Nurse Practitioner

## 2016-05-06 DIAGNOSIS — G4752 REM sleep behavior disorder: Secondary | ICD-10-CM

## 2016-05-06 DIAGNOSIS — G20 Parkinson's disease: Secondary | ICD-10-CM | POA: Diagnosis present

## 2016-05-06 LAB — POCT I-STAT CREATININE: CREATININE: 1.1 mg/dL — AB (ref 0.44–1.00)

## 2016-05-06 MED ORDER — GADOBENATE DIMEGLUMINE 529 MG/ML IV SOLN
20.0000 mL | Freq: Once | INTRAVENOUS | Status: AC | PRN
  Administered 2016-05-06: 19 mL via INTRAVENOUS

## 2016-05-11 ENCOUNTER — Other Ambulatory Visit: Payer: Medicare Other

## 2016-05-11 ENCOUNTER — Ambulatory Visit: Payer: Medicare Other

## 2016-05-12 ENCOUNTER — Inpatient Hospital Stay: Payer: Medicare Other

## 2016-05-12 ENCOUNTER — Encounter: Payer: Self-pay | Admitting: Oncology

## 2016-05-12 ENCOUNTER — Inpatient Hospital Stay: Payer: Medicare Other | Attending: Oncology | Admitting: Oncology

## 2016-05-12 VITALS — BP 139/87 | HR 99 | Temp 97.7°F | Resp 18 | Wt 205.0 lb

## 2016-05-12 DIAGNOSIS — R5383 Other fatigue: Secondary | ICD-10-CM | POA: Insufficient documentation

## 2016-05-12 DIAGNOSIS — Z7982 Long term (current) use of aspirin: Secondary | ICD-10-CM

## 2016-05-12 DIAGNOSIS — C50419 Malignant neoplasm of upper-outer quadrant of unspecified female breast: Secondary | ICD-10-CM

## 2016-05-12 DIAGNOSIS — Z85038 Personal history of other malignant neoplasm of large intestine: Secondary | ICD-10-CM | POA: Diagnosis not present

## 2016-05-12 DIAGNOSIS — Z800 Family history of malignant neoplasm of digestive organs: Secondary | ICD-10-CM | POA: Insufficient documentation

## 2016-05-12 DIAGNOSIS — Z803 Family history of malignant neoplasm of breast: Secondary | ICD-10-CM | POA: Diagnosis not present

## 2016-05-12 DIAGNOSIS — G20 Parkinson's disease: Secondary | ICD-10-CM | POA: Insufficient documentation

## 2016-05-12 DIAGNOSIS — M797 Fibromyalgia: Secondary | ICD-10-CM | POA: Diagnosis not present

## 2016-05-12 DIAGNOSIS — R531 Weakness: Secondary | ICD-10-CM | POA: Insufficient documentation

## 2016-05-12 DIAGNOSIS — Z8041 Family history of malignant neoplasm of ovary: Secondary | ICD-10-CM | POA: Diagnosis not present

## 2016-05-12 DIAGNOSIS — Z853 Personal history of malignant neoplasm of breast: Secondary | ICD-10-CM | POA: Diagnosis not present

## 2016-05-12 DIAGNOSIS — Z9221 Personal history of antineoplastic chemotherapy: Secondary | ICD-10-CM | POA: Diagnosis not present

## 2016-05-12 DIAGNOSIS — R5381 Other malaise: Secondary | ICD-10-CM | POA: Diagnosis not present

## 2016-05-12 DIAGNOSIS — Z9223 Personal history of estrogen therapy: Secondary | ICD-10-CM | POA: Diagnosis not present

## 2016-05-12 DIAGNOSIS — Z79899 Other long term (current) drug therapy: Secondary | ICD-10-CM | POA: Insufficient documentation

## 2016-05-12 DIAGNOSIS — Z885 Allergy status to narcotic agent status: Secondary | ICD-10-CM | POA: Insufficient documentation

## 2016-05-12 NOTE — Progress Notes (Signed)
Here for follow up after not being here x 1 y

## 2016-05-13 NOTE — Progress Notes (Signed)
Hematology/Oncology Consult note [facility]  [phone] [phone]  Patient Care Team: Jerrol Banana., MD as PCP - General (Family Medicine) Robert Bellow, MD (General Surgery) Jerrol Banana., MD (Family Medicine)   Name of the patient: Sakeena Teall  [id number]  August 29, 1945   Date of visit: 05/13/16  Diagnosis- h/o left breast cancer  Chief complaint/ Reason for visit- routine f/u  Heme/Onc history: Patient is a 71 yr old female with a h/o left breast carcinoma in 2011. She is s/p lumpectomy and adjuvant chemotherapy in the past. She has been following up with Dr. Bary Castilla yearly after her mammograms in June. She has been diagnosed with parkinsons and also has limited mobility due to her right hip fracture.   Interval history- feels fatigued. Mainly uses wheelchair and unable to stan. Reports that her weakness is somewhat better after she has started her medications for parkinsons  ECOG PS- 3 Pain scale- 4   Review of systems- Review of Systems  Constitutional: Positive for malaise/fatigue. Negative for chills, fever and weight loss.  HENT: Negative for congestion, ear discharge and nosebleeds.   Eyes: Negative for blurred vision.  Respiratory: Negative for cough, hemoptysis, sputum production, shortness of breath and wheezing.   Cardiovascular: Negative for chest pain, palpitations, orthopnea and claudication.  Gastrointestinal: Negative for abdominal pain, blood in stool, constipation, diarrhea, heartburn, melena, nausea and vomiting.  Genitourinary: Negative for dysuria, flank pain, frequency, hematuria and urgency.  Musculoskeletal: Negative for back pain, joint pain and myalgias.  Skin: Negative for rash.  Neurological: Positive for weakness. Negative for dizziness, tingling, focal weakness, seizures and headaches.  Endo/Heme/Allergies: Does not bruise/bleed easily.  Psychiatric/Behavioral: Negative for  depression and suicidal ideas. The patient does not have insomnia.      Current treatment- observation  Allergies  Allergen Reactions  . Alendronate Nausea Only    Gi symptoms  . Gluten Meal Nausea Only    Other reaction(s): NAUSEA Other reaction(s): NAUSEA Other reaction(s): NAUSEA  . Lac Bovis Nausea And Vomiting  . Milk-Related Compounds   . Nsaids     GI upset.  . Other Other (See Comments)    Other reaction(s): OTHER  . Oxycodone-Acetaminophen   . Oxycodone-Acetaminophen Other (See Comments)    Other reaction(s): NAUSEA  . Vicodin [Hydrocodone-Acetaminophen] Nausea Only, Diarrhea and Nausea And Vomiting  . Wheat Bran Other (See Comments)  . Sulfa Antibiotics Rash    Rash from steri strips  . Tape Rash    Rash from steri strips     Past Medical History:  Diagnosis Date  . Arm fracture    right forearm, d/t fall  . Bowel trouble 2010  . Breast cancer ([place]) 12/2009   LEFT LUMPECTOMY, chemo and radiation  . Cancer [facility]) 12/2009   Left UOQ breast tumor; wide excision,sn bx and whole breast radiation. Chemotherapy done. There was only a 45m area of residual tumor remaining. All sn were negative. This was ER-positive, PR-negative, HER-2 neu not over expressed tumor. The original ultrasound size was 2.6 cm(T2).  . H/O cystitis 2011  . Malignant neoplasm of upper-outer quadrant of female breast ([facility] January 13, 2010   2+ centimeter tumor, neoadjuvant chemotherapy. On wide excision 3 mm area of residual tumor. Sentinel node negative. ER-30%, PR-negative, HER-2 not overexpressing.  . [place]   Dr SManuella Ghaziis pt s DR  . Personal history of fibromyalgia 2002  . Personal history of malignant neoplasm of large  intestine   . Special screening for malignant neoplasms, colon   . Unspecified constipation      Past Surgical History:  Procedure Laterality Date  . ABDOMINAL HYSTERECTOMY  1974  . BACK SURGERY  1985   bone spurs between 5&6, used bone from  left hip  . BREAST BIOPSY Left 2011   POS  . BREAST CYST ASPIRATION Left 1985  . BREAST SURGERY Left 2011   wide excision  . CERVICAL DISCECTOMY    . COLON RESECTION  Sept 2014   [facility]   . COLONOSCOPY  2010   Dr. Allen Norris, normal  . FEMUR FRACTURE SURGERY Right March 2016  . JOINT REPLACEMENT Bilateral Jan 2016 and March 2016   hip  . PORT-A-CATH REMOVAL  2013  . PORTACATH PLACEMENT  2011  . SKIN CANCER EXCISION  1980   face  . TUBAL LIGATION  1973  . UPPER GI ENDOSCOPY  08/20/07   gastritis without hemorrhage    Social History   Social History  . Marital status: Married    Spouse name: N/A  . Number of children: N/A  . Years of education: N/A   Occupational History  . Not on file.   Social History Main Topics  . Smoking status: Never Smoker  . Smokeless tobacco: Never Used  . Alcohol use No  . Drug use: No  . Sexual activity: No   Other Topics Concern  . Not on file   Social History Narrative  . No narrative on file    Family History  Problem Relation Age of Onset  . Cancer Brother     colon  . Cancer Other     breast and ovarian cancers, relationships not listed  . Stroke Mother   . Osteoporosis Mother   . Depression Sister   . Depression Sister   . Depression Sister   . Cancer Other 46    niece  . Breast cancer Neg Hx      Current Outpatient Prescriptions:  .  ALPRAZolam (XANAX) 0.5 MG tablet, Take 1 tablet (0.5 mg total) by mouth every 4 (four) hours as needed., Disp: 90 tablet, Rfl: 5 .  aspirin-acetaminophen-caffeine (EXCEDRIN MIGRAINE) 250-250-65 MG tablet, Take by mouth. Reported on 06/19/2015, Disp: , Rfl:  .  carbidopa-levodopa (SINEMET CR) 50-200 MG tablet, , Disp: , Rfl:  .  doxycycline (VIBRAMYCIN) 100 MG capsule, Take 100 mg by mouth 2 (two) times daily., Disp: , Rfl:  .  mirtazapine (REMERON) 30 MG tablet, Take 1 tablet (30 mg total) by mouth at bedtime., Disp: 30 tablet, Rfl: 6 .  omeprazole (PRILOSEC) 20 MG capsule, Take by mouth.  Reported on 07/22/2015, Disp: , Rfl:  .  polyethylene glycol (MIRALAX / GLYCOLAX) packet, Take 17 g by mouth daily., Disp: , Rfl:  .  promethazine (PHENERGAN) 25 MG tablet, Take 0.5 tablets (12.5 mg total) by mouth every 6 (six) hours as needed for nausea or vomiting., Disp: 60 tablet, Rfl: 3 .  traMADol (ULTRAM) 50 MG tablet, Take 1 tablet (50 mg total) by mouth every 6 (six) hours as needed. (Patient not taking: Reported on 05/12/2016), Disp: 100 tablet, Rfl: 3  Physical exam:  Vitals:   05/12/16 1501  BP: 139/87  Pulse: 99  Resp: 18  Temp: 97.7 F (36.5 C)  TempSrc: Tympanic  Weight: 205 lb (93 kg)   Physical Exam  Constitutional: She is oriented to person, place, and time.  Appears fatigued. Sitting in a wheelchair  HENT:  Head:  Normocephalic and atraumatic.  Eyes: EOM are normal. Pupils are equal, round, and reactive to light.  Neck: Normal range of motion.  Cardiovascular: Normal rate, regular rhythm and normal heart sounds.   Pulmonary/Chest: Effort normal and breath sounds normal.  Abdominal: Soft. Bowel sounds are normal.  Neurological: She is alert and oriented to person, place, and time.  Skin: Skin is warm and dry.   Breast exam is limited as patient is unable to stand and lay on the table. Breast exam performed in sitting position. No plapable abnormalities in both breasts. No palpable axillary adenopathy  CMP Latest Ref Rng & Units 05/06/2016  Glucose 65 - 99 mg/dL -  BUN 6 - 20 mg/dL -  Creatinine 0.44 - 1.00 mg/dL 1.10(H)  Sodium 135 - 145 mmol/L -  Potassium 3.5 - 5.1 mmol/L -  Chloride 101 - 111 mmol/L -  CO2 22 - 32 mmol/L -  Calcium 8.9 - 10.3 mg/dL -  Total Protein 6.5 - 8.1 g/dL -  Total Bilirubin 0.3 - 1.2 mg/dL -  Alkaline Phos 38 - 126 U/L -  AST 15 - 41 U/L -  ALT 14 - 54 U/L -   CBC Latest Ref Rng & Units 05/12/2015  WBC 3.6 - 11.0 K/uL 6.6  Hemoglobin 12.0 - 16.0 g/dL 12.7  Hematocrit 35.0 - 47.0 % 37.9  Platelets 150 - 440 K/uL 231    No  images are attached to the encounter.  Mr Jeri Cos Wo Contrast  Result Date: 05/06/2016 CLINICAL DATA:  71 year old female with Parkinson disease. EXAM: MRI HEAD WITHOUT AND WITH CONTRAST TECHNIQUE: Multiplanar, multiecho pulse sequences of the brain and surrounding structures were obtained without and with intravenous contrast. CONTRAST:  33m MULTIHANCE GADOBENATE DIMEGLUMINE 529 MG/ML IV SOLN COMPARISON:  No prior head CT or brain MRI. Cervical spine radiographs 07/31/2013. FINDINGS: Brain: Cerebral volume is within normal limits for age. No restricted diffusion to suggest acute infarction. No midline shift, mass effect, evidence of mass lesion, ventriculomegaly, extra-axial collection or acute intracranial hemorrhage. Cervicomedullary junction and pituitary are within normal limits. GPearline Cablesand white matter signal is within normal limits for age throughout the brain. No cortical encephalomalacia or chronic cerebral blood products identified. Deep gray matter nuclei are within normal limits. Brainstem and cerebellum are within normal limits. No abnormal enhancement identified. No dural thickening. Vascular: Major intracranial vascular flow voids are preserved. Skull and upper cervical spine: Negative. Normal bone marrow signal. Sinuses/Orbits: Orbits soft tissues appear normal. Paranasal sinuses are clear. Other: Visible internal auditory structures appear normal. Mastoids are clear. Negative scalp soft tissues. IMPRESSION: Normal for age MRI appearance of the brain. Electronically Signed   By: HGenevie AnnM.D.   On: 05/06/2016 16:26     Assessment and plan- Patient is a 71y.o. female with a history of left breast cancer in 2011 status post lumpectomy and chemotherapy currently on yearly surveillance  From breast cancer perspective- clinically there is no evidence of recurrence on todays exam. It is difficult for patient to get to doctors appointments regularly due to her limited mobility. Her breast cancer  was diagnosed in 2011 and since it has been more than 5 years since her diagnosis, it would be ok for her to f/u with DR. Byrnett on a yearly basis and get yearly mammograms through his office. She can be referred to [place] there are are questions or concerns in the future   Visit Diagnosis 1. Malignant neoplasm of upper-outer quadrant of female breast,  unspecified estrogen receptor status, unspecified laterality ([place])      Dr. Randa Evens, MD, MPH [facility] Pager- [phone] 05/13/2016 9:07 AM

## 2016-06-13 ENCOUNTER — Telehealth: Payer: Self-pay | Admitting: Family Medicine

## 2016-06-13 NOTE — Telephone Encounter (Signed)
Terri Perez pt's daughter calling stating pt needs a refill on the following medication. Pt uses [facility] [address]  promethazine (PHENERGAN) 25 MG tablet

## 2016-06-13 NOTE — Telephone Encounter (Signed)
This was done in April with 3 refills-aa

## 2016-06-13 NOTE — Telephone Encounter (Signed)
Please advise. Thanks.  

## 2016-06-14 ENCOUNTER — Other Ambulatory Visit: Payer: Self-pay | Admitting: Family Medicine

## 2016-06-14 DIAGNOSIS — R110 Nausea: Secondary | ICD-10-CM

## 2016-06-28 DIAGNOSIS — G20 Parkinson's disease: Secondary | ICD-10-CM | POA: Diagnosis not present

## 2016-07-15 ENCOUNTER — Ambulatory Visit
Admission: RE | Admit: 2016-07-15 | Discharge: 2016-07-15 | Disposition: A | Discharge status: Home / Self Care | Payer: Medicare Other | Source: Ambulatory Visit | Attending: General Surgery | Admitting: General Surgery

## 2016-07-15 DIAGNOSIS — R922 Inconclusive mammogram: Secondary | ICD-10-CM | POA: Diagnosis not present

## 2016-07-15 DIAGNOSIS — C50412 Malignant neoplasm of upper-outer quadrant of left female breast: Secondary | ICD-10-CM | POA: Diagnosis not present

## 2016-07-15 HISTORY — DX: Personal history of antineoplastic chemotherapy: Z92.21

## 2016-07-15 HISTORY — DX: Personal history of irradiation: Z92.3

## 2016-07-21 ENCOUNTER — Ambulatory Visit (INDEPENDENT_AMBULATORY_CARE_PROVIDER_SITE_OTHER): Payer: Medicare Other | Admitting: General Surgery

## 2016-07-21 ENCOUNTER — Encounter: Payer: Self-pay | Admitting: General Surgery

## 2016-07-21 VITALS — BP 158/94 | HR 94 | Resp 16 | Ht 67.0 in | Wt 206.0 lb

## 2016-07-21 DIAGNOSIS — Z170 Estrogen receptor positive status [ER+]: Secondary | ICD-10-CM | POA: Diagnosis not present

## 2016-07-21 DIAGNOSIS — C50412 Malignant neoplasm of upper-outer quadrant of left female breast: Secondary | ICD-10-CM

## 2016-07-21 NOTE — Patient Instructions (Addendum)
The patient has been asked to return to the office in one year with a bilateral screeningmammogram.The patient is aware to call back for any questions or concerns.  

## 2016-07-21 NOTE — Progress Notes (Signed)
[name], female   DOB: 1945/04/10, 71 y.o.   MRN: [id number]  Chief Complaint  Patient presents with  . Follow-up    mammogram     HPI Terri Perez is a 71 y.o. female who presents for a breast evaluation. The most recent mammogram was done on 07/15/2016.Marland Kitchen  Patient does perform regular self breast checks and gets regular mammograms done.  Moves her bowels two times daily.Patient was diagnosed with Parkinson disease.  The patient was accompanied by her husband who was present for the interview and exam.  HPI  Past Medical History:  Diagnosis Date  . Arm fracture    right forearm, d/t fall  . Bowel trouble 2010  . Breast cancer ([place]) 12/2009   LEFT LUMPECTOMY  . Cancer [facility]) 12/2009   Left UOQ breast tumor; wide excision,sn bx and whole breast radiation. Chemotherapy done. There was only a 22m area of residual tumor remaining. All sn were negative. This was ER-positive, PR-negative, HER-2 neu not over expressed tumor. The original ultrasound size was 2.6 cm(T2).  . H/O cystitis 2011  . Malignant neoplasm of upper-outer quadrant of female breast ([facility] January 13, 2010   2+ centimeter tumor, neoadjuvant chemotherapy. On wide excision 3 mm area of residual tumor. Sentinel node negative. ER-30%, PR-negative, HER-2 not overexpressing.  . [place] disease [place] 04[place]   Dr SManuella Ghaziis pt s DR  . Personal history of chemotherapy 2012   BREAST CA  . Personal history of fibromyalgia 2002  . Personal history of malignant neoplasm of large intestine   . Personal history of radiation therapy 2012   BREAST CA  . Special screening for malignant neoplasms, colon   . Unspecified constipation     Past Surgical History:  Procedure Laterality Date  . ABDOMINAL HYSTERECTOMY  1974  . BACK SURGERY  1985   bone spurs between 5&6, used bone from left hip  . BREAST BIOPSY Left 2011   POS  . BREAST CYST ASPIRATION Left 1985  . BREAST SURGERY Left 2011   wide excision  .  CERVICAL DISCECTOMY    . [facility]  . COLONOSCOPY  2010   Dr. WAllen Norris normal  . FEMUR FRACTURE SURGERY Right March 2016  . JOINT REPLACEMENT Bilateral Jan 2016 and March 2016   hip  . PORT-A-CATH REMOVAL  2013  . PORTACATH PLACEMENT  2011  . SKIN CANCER EXCISION  1980   face  . TUBAL LIGATION  1973  . UPPER GI ENDOSCOPY  08/20/07   gastritis without hemorrhage    Family History  Problem Relation Age of Onset  . Cancer Brother        colon  . Cancer Other        breast and ovarian cancers, relationships not listed  . Stroke Mother   . Osteoporosis Mother   . Depression Sister   . Depression Sister   . Depression Sister   . Cancer Other 580      niece  . Breast cancer Neg Hx     Social History Social History  Substance Use Topics  . Smoking status: Never Smoker  . Smokeless tobacco: Never Used  . Alcohol use No    Allergies  Allergen Reactions  . Alendronate Nausea Only    Gi symptoms  . Gluten Meal Nausea Only    Other reaction(s): NAUSEA Other reaction(s): NAUSEA Other reaction(s): NAUSEA  . Lac Bovis Nausea And Vomiting  .  Milk-Related Compounds   . Nsaids     GI upset.  . Other Other (See Comments)    Other reaction(s): OTHER  . Oxycodone-Acetaminophen   . Oxycodone-Acetaminophen Other (See Comments)    Other reaction(s): NAUSEA  . Vicodin [Hydrocodone-Acetaminophen] Nausea Only, Diarrhea and Nausea And Vomiting  . Wheat Bran Other (See Comments)  . Sulfa Antibiotics Rash    Rash from steri strips  . Tape Rash    Rash from steri strips    Current Outpatient Prescriptions  Medication Sig Dispense Refill  . ALPRAZolam (XANAX) 0.5 MG tablet Take 1 tablet (0.5 mg total) by mouth every 4 (four) hours as needed. 90 tablet 5  . aspirin-acetaminophen-caffeine (EXCEDRIN MIGRAINE) 425-956-38 MG tablet Take by mouth. Reported on 06/19/2015    . carbidopa-levodopa (SINEMET CR) 50-200 MG tablet     . doxycycline (VIBRAMYCIN) 100 MG  capsule Take 100 mg by mouth 2 (two) times daily.    . mirtazapine (REMERON) 30 MG tablet Take 1 tablet (30 mg total) by mouth at bedtime. 30 tablet 6  . omeprazole (PRILOSEC) 20 MG capsule Take by mouth. Reported on 07/22/2015    . polyethylene glycol (MIRALAX / GLYCOLAX) packet Take 17 g by mouth daily.    . promethazine (PHENERGAN) 25 MG suppository PLACE 1 SUPPOSITORY (25 MG TOTAL) RECTALLY EVERY 6 (SIX) HOURS AS NEEDED FOR NAUSEA OR VOMITING. 12 suppository 5  . promethazine (PHENERGAN) 25 MG tablet Take 0.5 tablets (12.5 mg total) by mouth every 6 (six) hours as needed for nausea or vomiting. 60 tablet 3  . traMADol (ULTRAM) 50 MG tablet Take 1 tablet (50 mg total) by mouth every 6 (six) hours as needed. 100 tablet 3   No current facility-administered medications for this visit.     Review of Systems Review of Systems  Constitutional: Negative.   Respiratory: Negative.   Cardiovascular: Negative.     Blood pressure (!) 158/94, pulse 94, resp. rate 16, height '5\' 7"'  (1.702 m), weight 206 lb (93.4 kg).  Physical Exam Physical Exam  Constitutional: She is oriented to person, place, and time. She appears well-developed and well-nourished.  Eyes: Conjunctivae are normal. No scleral icterus.  Neck: Neck supple.  Cardiovascular: Normal rate, regular rhythm and normal heart sounds.   Pulmonary/Chest: Effort normal and breath sounds normal. Right breast exhibits no inverted nipple, no mass, no nipple discharge, no skin change and no tenderness. Left breast exhibits no inverted nipple, no mass, no nipple discharge, no skin change and no tenderness.    Lymphadenopathy:    She has no cervical adenopathy.    She has no axillary adenopathy.  Neurological: She is alert and oriented to person, place, and time.  Skin: Skin is warm and dry.    Data Reviewed 07/15/2016 bilateral diagnostic mammograms were independently reviewed. The patient was not able to tolerate focal spot compression views  of the left breast. RECOMMENDATION: Diagnostic mammogram is suggested in 1 year. (Code:DM-B-01Y)  I have discussed the findings and recommendations with the patient. Results were also provided in writing at the conclusion of the visit. If applicable, a reminder letter will be sent to the patient regarding the next appointment.  BI-RADS CATEGORY  2: Benign.    Assessment    Benign breast exam, no evidence of recurrent tumor.  Progressive neurologic weakness.    Plan        The patient has been asked to return to the office in one year with a bilateral screening mammogram.  HPI, Physical Exam, Assessment and Plan have been scribed under the direction and in the presence of Hervey Ard, MD.  Gaspar Cola, CMA  I have completed the exam and reviewed the above documentation for accuracy and completeness.  I agree with the above.  Haematologist has been used and any errors in dictation or transcription are unintentional.  Hervey Ard, M.D., F.A.C.S.  Robert Bellow 07/22/2016, 8:27 PM

## 2016-08-04 ENCOUNTER — Ambulatory Visit (INDEPENDENT_AMBULATORY_CARE_PROVIDER_SITE_OTHER): Payer: Medicare Other | Admitting: Family Medicine

## 2016-08-04 VITALS — BP 126/62 | HR 98 | Temp 98.0°F | Resp 16 | Wt 211.0 lb

## 2016-08-04 DIAGNOSIS — M158 Other polyosteoarthritis: Secondary | ICD-10-CM

## 2016-08-04 DIAGNOSIS — K5909 Other constipation: Secondary | ICD-10-CM

## 2016-08-04 DIAGNOSIS — M8000XG Age-related osteoporosis with current pathological fracture, unspecified site, subsequent encounter for fracture with delayed healing: Secondary | ICD-10-CM

## 2016-08-04 DIAGNOSIS — G20 Parkinson's disease: Secondary | ICD-10-CM | POA: Diagnosis not present

## 2016-08-04 DIAGNOSIS — G20A1 Parkinson's disease without dyskinesia, without mention of fluctuations: Secondary | ICD-10-CM

## 2016-08-04 DIAGNOSIS — S3210XA Unspecified fracture of sacrum, initial encounter for closed fracture: Secondary | ICD-10-CM | POA: Insufficient documentation

## 2016-08-04 NOTE — Progress Notes (Signed)
Terri Perez  MRN: [id number] DOB: 08/10/1945  Subjective:  HPI  Patient is here for 3 months follow up. Last office visit was on 05/05/16. Patient has seen Dr Manuella Ghazi for follow up since last visit. She is taking Carbidopa at 25-100 mg 2 1/2 tablets three times daily. Doing better patient states. No tremors present. Getting better with getting up and down. She is having a lot of stiffness in her leg muscles and wonders if that is from parkinson's. She is noticing that she is more hungry, more often and will get stomach pain and burning sensation if she does not eat something.  Patient is taking Mirtazapine for sleep and is doing ok with sleep on this medication. BP Readings from Last 3 Encounters:  08/04/16 126/62  07/21/16 (!) 158/94  05/12/16 139/87   Depression screen PHQ 2/9 08/04/2016 05/05/2016 03/31/2016  Decreased Interest 1 0 0  Down, Depressed, Hopeless 0 1 3  PHQ - 2 Score '1 1 3  ' Altered sleeping 0 3 2  Tired, decreased energy '2 3 3  ' Change in appetite 3 0 0  Feeling bad or failure about yourself  0 0 3  Trouble concentrating 0 2 3  Moving slowly or fidgety/restless 0 - 3  Suicidal thoughts 0 2 0  PHQ-9 Score '6 11 17  ' Difficult doing work/chores - - -    Patient Active Problem List   Diagnosis Date Noted  . Fracture of sacrum ([place]) 08/04/2016  . Adiposity 05/05/2016  . Parkinson disease ([place]) 04/25/2016  . 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 Bilateral lower extremity edema 07/23/2015  . 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 Chronic kidney disease (CKD), stage III (moderate) 05/11/2015  . Elevated rheumatoid factor 05/11/2015  . Primary osteoarthritis of both hips 05/11/2015  . Episode of syncope 04/24/2015  . History of operative procedure on hip 04/24/2015  . Restless leg 04/24/2015  . Osteoporosis 03/30/2015  . Insomnia 03/30/2015  . Skin cancer 03/30/2015  . MDD (major depressive disorder) 03/30/2015  . Migraine 03/30/2015  . GERD  (gastroesophageal reflux disease) 03/30/2015  . IBS (irritable bowel syndrome) 03/30/2015  . OA (osteoarthritis) 03/30/2015  . Skin lesion of chest wall 07/17/2014  . Closed fracture of shaft of femur ([place]) 04/01/2014  . Fracture of bone adjacent to prosthesis 04/01/2014  . Breast cancer ([place]) 03/07/2013  . Personal history of malignant neoplasm of large intestine   . Malignant neoplasm of upper-outer quadrant of female breast ([place])   . Acute anxiety 06/11/2008  . CONSTIPATION, CHRONIC 06/11/2008  . Fibromyalgia 06/11/2008    Past Medical History:  Diagnosis Date  . Arm fracture    right forearm, d/t fall  . Bowel trouble 2010  . Breast cancer ([place]) 12/2009   LEFT LUMPECTOMY  . Cancer [facility]) 12/2009   Left UOQ breast tumor; wide excision,sn bx and whole breast radiation. Chemotherapy done. There was only a 37m area of residual tumor remaining. All sn were negative. This was ER-positive, PR-negative, HER-2 neu not over expressed tumor. The original ultrasound size was 2.6 cm(T2).  . H/O cystitis 2011  . Malignant neoplasm of upper-outer quadrant of female breast ([facility] January 13, 2010   2+ centimeter tumor, neoadjuvant chemotherapy. On wide excision 3 mm area of residual tumor. Sentinel node negative. ER-30%, PR-negative, HER-2 not overexpressing.  . Parkinson disease ([place] 04/2016   Dr SManuella Ghaziis pt s DR  . Personal history of chemotherapy 2012   BREAST CA  . Personal history of fibromyalgia  2002  . Personal history of malignant neoplasm of large intestine   . Personal history of radiation therapy 2012   BREAST CA  . Special screening for malignant neoplasms, colon   . Unspecified constipation     Social History   Social History  . Marital status: Married    Spouse name: N/A  . Number of children: N/A  . Years of education: N/A   Occupational History  . Not on file.   Social History Main Topics  . Smoking status: Never Smoker  . Smokeless tobacco: Never Used  .  Alcohol use No  . Drug use: No  . Sexual activity: No   Other Topics Concern  . Not on file   Social History Narrative  . No narrative on file    Outpatient Encounter Prescriptions as of 08/04/2016  Medication Sig Note  . ALPRAZolam (XANAX) 0.5 MG tablet Take 1 tablet (0.5 mg total) by mouth every 4 (four) hours as needed.   Marland Kitchen aspirin-acetaminophen-caffeine (EXCEDRIN MIGRAINE) 250-250-65 MG tablet Take by mouth. Reported on 06/19/2015 04/24/2015: Medication taken as needed.  Received from: [facility]  . carbidopa-levodopa (SINEMET IR) 25-100 MG tablet Take 2 tablets by mouth 3 (three) times daily.   Marland Kitchen doxycycline (VIBRAMYCIN) 100 MG capsule Take 100 mg by mouth 2 (two) times daily.   . mirtazapine (REMERON) 30 MG tablet Take 1 tablet (30 mg total) by mouth at bedtime.   Marland Kitchen omeprazole (PRILOSEC) 20 MG capsule Take by mouth. Reported on 07/22/2015 04/24/2015: Received from: [facility]  . polyethylene glycol (MIRALAX / GLYCOLAX) packet Take 17 g by mouth daily.   . promethazine (PHENERGAN) 25 MG suppository PLACE 1 SUPPOSITORY (25 MG TOTAL) RECTALLY EVERY 6 (SIX) HOURS AS NEEDED FOR NAUSEA OR VOMITING.   . promethazine (PHENERGAN) 25 MG tablet Take 0.5 tablets (12.5 mg total) by mouth every 6 (six) hours as needed for nausea or vomiting.   . traMADol (ULTRAM) 50 MG tablet Take 1 tablet (50 mg total) by mouth every 6 (six) hours as needed.   . [DISCONTINUED] carbidopa-levodopa (SINEMET CR) 50-200 MG tablet     No facility-administered encounter medications on file as of 08/04/2016.     Allergies  Allergen Reactions  . Alendronate Nausea Only    Gi symptoms  . Gluten Meal Nausea Only    Other reaction(s): NAUSEA Other reaction(s): NAUSEA Other reaction(s): NAUSEA  . Lac Bovis Nausea And Vomiting  . Milk-Related Compounds   . Nsaids     GI upset.  . Other Other (See Comments)    Other reaction(s): OTHER  . Oxycodone-Acetaminophen   .  Oxycodone-Acetaminophen Other (See Comments)    Other reaction(s): NAUSEA  . Vicodin [Hydrocodone-Acetaminophen] Nausea Only, Diarrhea and Nausea And Vomiting  . Wheat Bran Other (See Comments)  . Sulfa Antibiotics Rash    Rash from steri strips  . Tape Rash    Rash from steri strips    Review of Systems  Constitutional: Positive for malaise/fatigue.       Overeating  Respiratory: Negative.   Cardiovascular: Negative.   Gastrointestinal: Positive for nausea. Negative for heartburn.  Musculoskeletal: Positive for joint pain.  Neurological: Positive for weakness. Negative for dizziness and tremors.  Psychiatric/Behavioral: Positive for depression. The patient has insomnia.     Objective:  BP 126/62   Pulse 98   Temp 98 F (36.7 C)   Resp 16   Wt 211 lb (95.7 kg) Comment: per patient, not able to weigh patient.  SpO2 96%   BMI 33.05 kg/m   Physical Exam  Constitutional: She is oriented to person, place, and time and well-developed, well-nourished, and in no distress.  HENT:  Head: Normocephalic and atraumatic.  Right Ear: External ear normal.  Left Ear: External ear normal.  Nose: Nose normal.  Eyes: Conjunctivae are normal. No scleral icterus.  Neck: No thyromegaly present.  Cardiovascular: Normal rate, regular rhythm and normal heart sounds.   Pulmonary/Chest: Effort normal and breath sounds normal.  Abdominal: Soft.  Neurological: She is alert and oriented to person, place, and time. Gait normal. GCS score is 15.  Skin: Skin is warm and dry.  Psychiatric: Mood, memory, affect and judgment normal.    Assessment and Plan :  Parkinsons Disease Per neurology. OA Osteoporosis GAD  I have done the exam and reviewed the chart and it is accurate to the best of my knowledge. Development worker, community has been used and  any errors in dictation or transcription are unintentional. Miguel Aschoff M.D. [facility]

## 2016-08-29 DIAGNOSIS — G20 Parkinson's disease: Secondary | ICD-10-CM | POA: Diagnosis not present

## 2016-08-29 DIAGNOSIS — G4752 REM sleep behavior disorder: Secondary | ICD-10-CM | POA: Diagnosis not present

## 2016-08-29 DIAGNOSIS — G2581 Restless legs syndrome: Secondary | ICD-10-CM | POA: Diagnosis not present

## 2016-08-31 DIAGNOSIS — K5909 Other constipation: Secondary | ICD-10-CM | POA: Diagnosis not present

## 2016-08-31 DIAGNOSIS — M1990 Unspecified osteoarthritis, unspecified site: Secondary | ICD-10-CM | POA: Diagnosis not present

## 2016-08-31 DIAGNOSIS — G8929 Other chronic pain: Secondary | ICD-10-CM | POA: Diagnosis not present

## 2016-08-31 DIAGNOSIS — G20 Parkinson's disease: Secondary | ICD-10-CM | POA: Diagnosis not present

## 2016-08-31 DIAGNOSIS — M25551 Pain in right hip: Secondary | ICD-10-CM | POA: Diagnosis not present

## 2016-08-31 DIAGNOSIS — M797 Fibromyalgia: Secondary | ICD-10-CM | POA: Diagnosis not present

## 2016-08-31 DIAGNOSIS — F419 Anxiety disorder, unspecified: Secondary | ICD-10-CM | POA: Diagnosis not present

## 2016-08-31 DIAGNOSIS — F329 Major depressive disorder, single episode, unspecified: Secondary | ICD-10-CM | POA: Diagnosis not present

## 2016-08-31 DIAGNOSIS — Z9181 History of falling: Secondary | ICD-10-CM | POA: Diagnosis not present

## 2016-08-31 DIAGNOSIS — G2581 Restless legs syndrome: Secondary | ICD-10-CM | POA: Diagnosis not present

## 2016-08-31 DIAGNOSIS — M79651 Pain in right thigh: Secondary | ICD-10-CM | POA: Diagnosis not present

## 2016-08-31 DIAGNOSIS — Z853 Personal history of malignant neoplasm of breast: Secondary | ICD-10-CM | POA: Diagnosis not present

## 2016-09-02 ENCOUNTER — Telehealth: Payer: Self-pay

## 2016-09-02 NOTE — Telephone Encounter (Signed)
Leda Gauze physical therapist from [facility] called and needing verbal orders for patient to have physical therapy 2 times a week for 6 weeks. CB is [phone].  FYI Also occupational therapy is doing assessment on the patient now and may be calling for verbal orders about that too-aa

## 2016-09-05 NOTE — Telephone Encounter (Signed)
ok 

## 2016-09-05 NOTE — Telephone Encounter (Signed)
Terri Perez

## 2016-09-06 DIAGNOSIS — M25551 Pain in right hip: Secondary | ICD-10-CM | POA: Diagnosis not present

## 2016-09-06 DIAGNOSIS — M797 Fibromyalgia: Secondary | ICD-10-CM | POA: Diagnosis not present

## 2016-09-06 DIAGNOSIS — G8929 Other chronic pain: Secondary | ICD-10-CM | POA: Diagnosis not present

## 2016-09-06 DIAGNOSIS — M79651 Pain in right thigh: Secondary | ICD-10-CM | POA: Diagnosis not present

## 2016-09-06 DIAGNOSIS — G20 Parkinson's disease: Secondary | ICD-10-CM | POA: Diagnosis not present

## 2016-09-06 DIAGNOSIS — F329 Major depressive disorder, single episode, unspecified: Secondary | ICD-10-CM | POA: Diagnosis not present

## 2016-09-09 DIAGNOSIS — G8929 Other chronic pain: Secondary | ICD-10-CM | POA: Diagnosis not present

## 2016-09-09 DIAGNOSIS — G20 Parkinson's disease: Secondary | ICD-10-CM | POA: Diagnosis not present

## 2016-09-09 DIAGNOSIS — M79651 Pain in right thigh: Secondary | ICD-10-CM | POA: Diagnosis not present

## 2016-09-09 DIAGNOSIS — F329 Major depressive disorder, single episode, unspecified: Secondary | ICD-10-CM | POA: Diagnosis not present

## 2016-09-09 DIAGNOSIS — M797 Fibromyalgia: Secondary | ICD-10-CM | POA: Diagnosis not present

## 2016-09-09 DIAGNOSIS — M25551 Pain in right hip: Secondary | ICD-10-CM | POA: Diagnosis not present

## 2016-09-13 DIAGNOSIS — G8929 Other chronic pain: Secondary | ICD-10-CM | POA: Diagnosis not present

## 2016-09-13 DIAGNOSIS — F329 Major depressive disorder, single episode, unspecified: Secondary | ICD-10-CM | POA: Diagnosis not present

## 2016-09-13 DIAGNOSIS — M25551 Pain in right hip: Secondary | ICD-10-CM | POA: Diagnosis not present

## 2016-09-13 DIAGNOSIS — M797 Fibromyalgia: Secondary | ICD-10-CM | POA: Diagnosis not present

## 2016-09-13 DIAGNOSIS — G20 Parkinson's disease: Secondary | ICD-10-CM | POA: Diagnosis not present

## 2016-09-13 DIAGNOSIS — M79651 Pain in right thigh: Secondary | ICD-10-CM | POA: Diagnosis not present

## 2016-09-14 ENCOUNTER — Other Ambulatory Visit: Payer: Self-pay | Admitting: Family Medicine

## 2016-09-16 DIAGNOSIS — G20 Parkinson's disease: Secondary | ICD-10-CM | POA: Diagnosis not present

## 2016-09-16 DIAGNOSIS — M79651 Pain in right thigh: Secondary | ICD-10-CM | POA: Diagnosis not present

## 2016-09-16 DIAGNOSIS — M797 Fibromyalgia: Secondary | ICD-10-CM | POA: Diagnosis not present

## 2016-09-16 DIAGNOSIS — F329 Major depressive disorder, single episode, unspecified: Secondary | ICD-10-CM | POA: Diagnosis not present

## 2016-09-16 DIAGNOSIS — M25551 Pain in right hip: Secondary | ICD-10-CM | POA: Diagnosis not present

## 2016-09-16 DIAGNOSIS — G8929 Other chronic pain: Secondary | ICD-10-CM | POA: Diagnosis not present

## 2016-09-20 ENCOUNTER — Ambulatory Visit (INDEPENDENT_AMBULATORY_CARE_PROVIDER_SITE_OTHER): Payer: Medicare Other

## 2016-09-20 ENCOUNTER — Ambulatory Visit (INDEPENDENT_AMBULATORY_CARE_PROVIDER_SITE_OTHER): Payer: Medicare Other | Admitting: Family Medicine

## 2016-09-20 VITALS — BP 124/72 | HR 92 | Temp 98.8°F | Ht 67.0 in | Wt 211.0 lb

## 2016-09-20 VITALS — BP 124/72 | HR 92 | Temp 98.8°F | Ht 67.0 in

## 2016-09-20 DIAGNOSIS — F419 Anxiety disorder, unspecified: Secondary | ICD-10-CM

## 2016-09-20 DIAGNOSIS — K5909 Other constipation: Secondary | ICD-10-CM

## 2016-09-20 DIAGNOSIS — Z0000 Encounter for general adult medical examination without abnormal findings: Secondary | ICD-10-CM | POA: Diagnosis not present

## 2016-09-20 DIAGNOSIS — F331 Major depressive disorder, recurrent, moderate: Secondary | ICD-10-CM

## 2016-09-20 DIAGNOSIS — Z1159 Encounter for screening for other viral diseases: Secondary | ICD-10-CM

## 2016-09-20 DIAGNOSIS — M8080XG Other osteoporosis with current pathological fracture, unspecified site, subsequent encounter for fracture with delayed healing: Secondary | ICD-10-CM

## 2016-09-20 DIAGNOSIS — G20A1 Parkinson's disease without dyskinesia, without mention of fluctuations: Secondary | ICD-10-CM

## 2016-09-20 DIAGNOSIS — R739 Hyperglycemia, unspecified: Secondary | ICD-10-CM

## 2016-09-20 DIAGNOSIS — E784 Other hyperlipidemia: Secondary | ICD-10-CM

## 2016-09-20 DIAGNOSIS — Z2821 Immunization not carried out because of patient refusal: Secondary | ICD-10-CM

## 2016-09-20 DIAGNOSIS — M05751 Rheumatoid arthritis with rheumatoid factor of right hip without organ or systems involvement: Secondary | ICD-10-CM | POA: Diagnosis not present

## 2016-09-20 DIAGNOSIS — M797 Fibromyalgia: Secondary | ICD-10-CM

## 2016-09-20 DIAGNOSIS — Z289 Immunization not carried out for unspecified reason: Secondary | ICD-10-CM

## 2016-09-20 DIAGNOSIS — G20 Parkinson's disease: Secondary | ICD-10-CM

## 2016-09-20 DIAGNOSIS — M05752 Rheumatoid arthritis with rheumatoid factor of left hip without organ or systems involvement: Secondary | ICD-10-CM

## 2016-09-20 DIAGNOSIS — E7849 Other hyperlipidemia: Secondary | ICD-10-CM

## 2016-09-20 NOTE — Patient Instructions (Signed)
[name] , Thank you for taking time to come for your Medicare Wellness Visit. I appreciate your ongoing commitment to your health goals. Please review the following plan we discussed and let me know if I can assist you in the future.   Screening recommendations/referrals: Colonoscopy: up to date, due 02/2018 Mammogram: up to date, due 06/2017 Bone Density: completed 09/24/14 Recommended yearly ophthalmology/optometry visit for glaucoma screening and checkup Recommended yearly dental visit for hygiene and checkup  Vaccinations: Influenza vaccine: declined Pneumococcal vaccine: completed series Tdap vaccine: declined Shingles vaccine: declined  Advanced directives: Please bring a copy of your POA (Power of Attorney) and/or Living Will to your next appointment.   Conditions/risks identified: Recommend to continue stretches beyond physical therapy, to 3 days a week for 20-30 minutes at a time.   Next appointment: None, need to schedule 1 year AWV.   Preventive Care 23 Years and Older, Female Preventive care refers to lifestyle choices and visits with your health care provider that can promote health and wellness. What does preventive care include?  A yearly physical exam. This is also called an annual well check.  Dental exams once or twice a year.  Routine eye exams. Ask your health care provider how often you should have your eyes checked.  Personal lifestyle choices, including:  Daily care of your teeth and gums.  Regular physical activity.  Eating a healthy diet.  Avoiding tobacco and drug use.  Limiting alcohol use.  Practicing safe sex.  Taking low-dose aspirin every day.  Taking vitamin and mineral supplements as recommended by your health care provider. What happens during an annual well check? The services and screenings done by your health care provider during your annual well check will depend on your age, overall health, lifestyle risk factors, and  family history of disease. Counseling  Your health care provider may ask you questions about your:  Alcohol use.  Tobacco use.  Drug use.  Emotional well-being.  Home and relationship well-being.  Sexual activity.  Eating habits.  History of falls.  Memory and ability to understand (cognition).  Work and work Statistician.  Reproductive health. Screening  You may have the following tests or measurements:  Height, weight, and BMI.  Blood pressure.  Lipid and cholesterol levels. These may be checked every 5 years, or more frequently if you are over 66 years old.  Skin check.  Lung cancer screening. You may have this screening every year starting at age 35 if you have a 30-pack-year history of smoking and currently smoke or have quit within the past 15 years.  Fecal occult blood test (FOBT) of the stool. You may have this test every year starting at age 65.  Flexible sigmoidoscopy or colonoscopy. You may have a sigmoidoscopy every 5 years or a colonoscopy every 10 years starting at age 10.  Hepatitis C blood test.  Hepatitis B blood test.  Sexually transmitted disease (STD) testing.  Diabetes screening. This is done by checking your blood sugar (glucose) after you have not eaten for a while (fasting). You may have this done every 1-3 years.  Bone density scan. This is done to screen for osteoporosis. You may have this done starting at age 78.  Mammogram. This may be done every 1-2 years. Talk to your health care provider about how often you should have regular mammograms. Talk with your health care provider about your test results, treatment options, and if necessary, the need for more tests. Vaccines  Your health care provider  may recommend certain vaccines, such as:  Influenza vaccine. This is recommended every year.  Tetanus, diphtheria, and acellular pertussis (Tdap, Td) vaccine. You may need a Td booster every 10 years.  Zoster vaccine. You may need this  after age 83.  Pneumococcal 13-valent conjugate (PCV13) vaccine. One dose is recommended after age 5.  Pneumococcal polysaccharide (PPSV23) vaccine. One dose is recommended after age 17. Talk to your health care provider about which screenings and vaccines you need and how often you need them. This information is not intended to replace advice given to you by your health care provider. Make sure you discuss any questions you have with your health care provider. Document Released: 02/06/2015 Document Revised: 09/30/2015 Document Reviewed: 11/11/2014 Elsevier Interactive Patient Education  2017 [place] Prevention in the Home Falls can cause injuries. They can happen to people of all ages. There are many things you can do to make your home safe and to help prevent falls. What can I do on the outside of my home?  Regularly fix the edges of walkways and driveways and fix any cracks.  Remove anything that might make you trip as you walk through a door, such as a raised step or threshold.  Trim any bushes or trees on the path to your home.  Use bright outdoor lighting.  Clear any walking paths of anything that might make someone trip, such as rocks or tools.  Regularly check to see if handrails are loose or broken. Make sure that both sides of any steps have handrails.  Any raised decks and porches should have guardrails on the edges.  Have any leaves, snow, or ice cleared regularly.  Use sand or salt on walking paths during winter.  Clean up any spills in your garage right away. This includes oil or grease spills. What can I do in the bathroom?  Use night lights.  Install grab bars by the toilet and in the tub and shower. Do not use towel bars as grab bars.  Use non-skid mats or decals in the tub or shower.  If you need to sit down in the shower, use a plastic, non-slip stool.  Keep the floor dry. Clean up any water that spills on the floor as soon as it  happens.  Remove soap buildup in the tub or shower regularly.  Attach bath mats securely with double-sided non-slip rug tape.  Do not have throw rugs and other things on the floor that can make you trip. What can I do in the bedroom?  Use night lights.  Make sure that you have a light by your bed that is easy to reach.  Do not use any sheets or blankets that are too big for your bed. They should not hang down onto the floor.  Have a firm chair that has side arms. You can use this for support while you get dressed.  Do not have throw rugs and other things on the floor that can make you trip. What can I do in the kitchen?  Clean up any spills right away.  Avoid walking on wet floors.  Keep items that you use a lot in easy-to-reach places.  If you need to reach something above you, use a strong step stool that has a grab bar.  Keep electrical cords out of the way.  Do not use floor polish or wax that makes floors slippery. If you must use wax, use non-skid floor wax.  Do not have throw rugs  and other things on the floor that can make you trip. What can I do with my stairs?  Do not leave any items on the stairs.  Make sure that there are handrails on both sides of the stairs and use them. Fix handrails that are broken or loose. Make sure that handrails are as long as the stairways.  Check any carpeting to make sure that it is firmly attached to the stairs. Fix any carpet that is loose or worn.  Avoid having throw rugs at the top or bottom of the stairs. If you do have throw rugs, attach them to the floor with carpet tape.  Make sure that you have a light switch at the top of the stairs and the bottom of the stairs. If you do not have them, ask someone to add them for you. What else can I do to help prevent falls?  Wear shoes that:  Do not have high heels.  Have rubber bottoms.  Are comfortable and fit you well.  Are closed at the toe. Do not wear sandals.  If you  use a stepladder:  Make sure that it is fully opened. Do not climb a closed stepladder.  Make sure that both sides of the stepladder are locked into place.  Ask someone to hold it for you, if possible.  Clearly mark and make sure that you can see:  Any grab bars or handrails.  First and last steps.  Where the edge of each step is.  Use tools that help you move around (mobility aids) if they are needed. These include:  Canes.  Walkers.  Scooters.  Crutches.  Turn on the lights when you go into a dark area. Replace any light bulbs as soon as they burn out.  Set up your furniture so you have a clear path. Avoid moving your furniture around.  If any of your floors are uneven, fix them.  If there are any pets around you, be aware of where they are.  Review your medicines with your doctor. Some medicines can make you feel dizzy. This can increase your chance of falling. Ask your doctor what other things that you can do to help prevent falls. This information is not intended to replace advice given to you by your health care provider. Make sure you discuss any questions you have with your health care provider. Document Released: 11/06/2008 Document Revised: 06/18/2015 Document Reviewed: 02/14/2014 Elsevier Interactive Patient Education  2017 [employer].

## 2016-09-20 NOTE — Progress Notes (Signed)
Subjective:   Terri Perez is a 71 y.o. female who presents for Medicare Annual (Subsequent) preventive examination.  Review of Systems:  N/A  Cardiac Risk Factors include: advanced age (>75mn, >>24women);dyslipidemia;obesity (BMI >30kg/m2)     Objective:     Vitals: BP 124/72 (BP Location: Right Arm)   Pulse 92   Temp 98.8 F (37.1 C) (Oral)   Ht _0  (1.702 m)   There is no height or weight on file to calculate BMI.   Tobacco History  Smoking Status  . Never Smoker  Smokeless Tobacco  . Never Used     Counseling given: Not Answered   Past Medical History:  Diagnosis Date  . Arm fracture    right forearm, d/t fall  . Bowel trouble 2010  . [place]   LEFT LUMPECTOMY  . [facility]   Left UOQ breast tumor; wide excision,sn bx and whole breast radiation. Chemotherapy done. There was only a 348marea of residual tumor remaining. All sn were negative. This was ER-positive, PR-negative, HER-2 neu not over expressed tumor. The original ultrasound size was 2.6 cm(T2).  . H/O cystitis 2011  . [facility]   2+ centimeter tumor, neoadjuvant chemotherapy. On wide excision 3 mm area of residual tumor. Sentinel node negative. ER-30%, PR-negative, HER-2 not overexpressing.  . [place] [facility] [place]   Dr ShManuella Ghazis pt s DR  . Personal history of chemotherapy 2012   BREAST CA  . Personal history of fibromyalgia 2002  . Personal history of malignant neoplasm of large intestine   . Personal history of radiation therapy 2012   BREAST CA  . Special screening for malignant neoplasms, colon   . Unspecified constipation    Past Surgical History:  Procedure Laterality Date  . ABDOMINAL HYSTERECTOMY  1974  . BACK SURGERY  1985   bone spurs between 5&6, used bone from left hip  . BREAST BIOPSY Left 2011   POS  . BREAST CYST ASPIRATION Left 1985  . BREAST SURGERY Left  2011   wide excision  . CERVICAL DISCECTOMY    . [facility] COLONOSCOPY  2010   Dr. WoAllen Norrisnormal  . FEMUR FRACTURE SURGERY Right March 2016  . JOINT REPLACEMENT Bilateral Jan 2016 and March 2016   hip  . PORT-A-CATH REMOVAL  2013  . PORTACATH PLACEMENT  2011  . SKIN CANCER EXCISION  1980   face  . TUBAL LIGATION  1973  . UPPER GI ENDOSCOPY  08/20/07   gastritis without hemorrhage   Family History  Problem Relation Age of Onset  . Cancer Brother        colon  . Cancer Other        breast and ovarian cancers, relationships not listed  . Stroke Mother   . Osteoporosis Mother   . Depression Sister   . Depression Sister   . Depression Sister   . Cancer Other [id number]     niece  . Breast cancer Neg Hx    History  Sexual Activity  . Sexual activity: No    Outpatient Encounter Prescriptions as of 09/20/2016  Medication Sig  . ALPRAZolam (XANAX) 0.5 MG tablet Take 1 tablet (0.5 mg total) by mouth every 4 (four) hours as needed.  . Marland Kitchenspirin-acetaminophen-caffeine (EXCEDRIN MIGRAINE) 250-250-65 MG tablet Take by mouth every 8 (eight) hours  as needed. Reported on 06/19/2015  . carbidopa-levodopa (SINEMET IR) 25-100 MG tablet Take 2 tablets by mouth 3 (three) times daily.  . mirtazapine (REMERON) 30 MG tablet Take 1 tablet (30 mg total) by mouth at bedtime.  Marland Perez omeprazole (PRILOSEC) 20 MG capsule Take 20 mg by mouth daily. Reported on 07/22/2015  . polyethylene glycol (MIRALAX / GLYCOLAX) packet Take 17 g by mouth daily.  . promethazine (PHENERGAN) 25 MG suppository PLACE 1 SUPPOSITORY (25 MG TOTAL) RECTALLY EVERY 6 (SIX) HOURS AS NEEDED FOR NAUSEA OR VOMITING.  . promethazine (PHENERGAN) 25 MG tablet Take 0.5 tablets (12.5 mg total) by mouth every 6 (six) hours as needed for nausea or vomiting.  . traMADol (ULTRAM) 50 MG tablet TAKE 1 TABLET BY MOUTH EVERY 6 HOURS AS NEEDED  . [DISCONTINUED] doxycycline (VIBRAMYCIN) 100 MG capsule Take 100 mg by mouth 2 (two)  times daily.   No facility-administered encounter medications on file as of 09/20/2016.     Activities of Daily Living In your present state of health, do you have any difficulty performing the following activities: 09/20/2016 08/04/2016  Hearing? N N  Vision? N N  Difficulty concentrating or making decisions? N N  Walking or climbing stairs? Y Y  Dressing or bathing? Y Y  Doing errands, shopping? Tempie Donning  Preparing Food and eating ? N -  Using the Toilet? N -  In the past six months, have you accidently leaked urine? N -  Do you have problems with loss of bowel control? N -  Managing your Medications? N -  Managing your Finances? N -  Housekeeping or managing your Housekeeping? N -  Some recent data might be hidden    Patient Care Team: Jerrol Banana., MD as PCP - General (Family Medicine) Bary Castilla, Forest Gleason, MD (General Surgery) Vladimir Crofts, MD as Consulting Physician (Neurology)    Assessment:     Exercise Activities and Dietary recommendations Current Exercise Habits: Home exercise routine (physical therapy), Type of exercise: stretching;Other - see comments;walking (physical therapy), Time (Minutes): 30 (to 1 hour), Frequency (Times/Week): 2, Weekly Exercise (Minutes/Week): 60, Intensity: Mild, Exercise limited by: Other - see comments (parkinsons)  Goals    . Exercise 3x per week (30 min per time)          Recommend to continue stretches beyond physical therapy to 3 days a week for 20-30 minutes at a time.       Fall Risk Fall Risk  09/20/2016 08/04/2016 09/01/2015 03/30/2015 09/16/2014  Falls in the past year? No No No Yes Yes  Number falls in past yr: - - - 2 or more 2 or more  Injury with Fall? - - - Yes Yes  Risk Factor Category  - - - High Fall Risk High Fall Risk  Risk for fall due to : History of fall(s) - - - Impaired mobility  Follow up - - - Falls evaluation completed -   Depression Screen PHQ 2/9 Scores 08/04/2016 05/05/2016 03/31/2016 09/01/2015  PHQ - 2  Score _0 PHQ- 9 Score _1 Cognitive Function: Pt declined screening today.        Immunization History  Administered Date(s) Administered  . Pneumococcal Conjugate-13 12/12/2013  . Pneumococcal Polysaccharide-23 09/01/2015  . Td 03/01/1995   Screening Tests Health Maintenance  Topic Date Due  . Hepatitis C Screening  04-Nov-1945  . INFLUENZA VACCINE  09/24/2017 (Originally 08/24/2016)  . TETANUS/TDAP  01/24/2026 (Originally 02/28/2005)  . COLONOSCOPY  03/12/2018  . MAMMOGRAM  07/16/2018  . DEXA SCAN  Completed  . PNA vac Low Risk Adult  Completed      Plan:  I have personally reviewed and addressed the Medicare Annual Wellness questionnaire and have noted the following in the patient's chart:  A. Medical and social history B. Use of alcohol, tobacco or illicit drugs  C. Current medications and supplements D. Functional ability and status E.  Nutritional status F.  Physical activity G. Advance directives H. List of other physicians I.  Hospitalizations, surgeries, and ER visits in previous 12 months J.  [place] such as hearing and vision if needed, cognitive and depression L. Referrals and appointments - none  In addition, I have reviewed and discussed with patient certain preventive protocols, quality metrics, and best practice recommendations. A written personalized care plan for preventive services as well as general preventive health recommendations were provided to patient.  See attached scanned questionnaire for additional information.   Signed,  Fabio Neighbors, LPN Nurse Health Advisor   MD Recommendations: Pt declined tetanus and flu vaccine today. Pt to speak with PCP further about Hepatitis C screening.

## 2016-09-20 NOTE — Progress Notes (Addendum)
Patient: Terri Perez, Female    DOB: 12-24-1945, 71 y.o.   MRN: [id number] Visit Date: 09/20/2016  Today's Provider: [name], MD   No chief complaint on file.  Subjective:  Terri Perez is a 71 y.o. female who presents today for health maintenance and complete physical. She feels fairly well. She reports exercising no, unable do orthopedic issues and pain, difficulty with ambulation. She reports she is sleeping fairly well. Patient states Dr Bary Castilla does breast exam for her. Patient saw [facility] for Wellness Visit today. Patient declined 6CIT test. Patient declined Flu shot. Last mammogram 07/15/16-benign, Dr Fleet Contras. Repeat in 1 year. BMD 09/24/14 osteoporosis. Colonoscopy 03/22/08 Dr Allen Norris, internal hemorrhoids otherwise normal. Fall Risk  09/20/2016 08/04/2016 09/01/2015 03/30/2015 09/16/2014  Falls in the past year? No No No Yes Yes  Number falls in past yr: - - - 2 or more 2 or more  Injury with Fall? - - - Yes Yes  Risk Factor Category  - - - High Fall Risk High Fall Risk  Risk for fall due to : History of fall(s) - - - Impaired mobility  Follow up - - - Falls evaluation completed -   Review of Systems  Constitutional: Negative.   HENT: Negative.   Eyes: Negative.   Respiratory: Negative.   Cardiovascular: Negative.   Gastrointestinal: Positive for abdominal distention and nausea.  Endocrine: Negative.   Genitourinary: Negative.   Musculoskeletal: Positive for arthralgias, gait problem and myalgias.  Skin: Negative.   Allergic/Immunologic: Negative.   Hematological: Negative.   Psychiatric/Behavioral: Negative.     Social History   Social History  . Marital status: Married    Spouse name: N/A  . Number of children: N/A  . Years of education: N/A   Occupational History  . Not on file.   Social History Main Topics  . Smoking status: Never Smoker  . Smokeless tobacco: Never Used  . Alcohol use No  . Drug use: No  . Sexual activity: No   Other  Topics Concern  . Not on file   Social History Narrative  . No narrative on file    Patient Active Problem List   Diagnosis Date Noted  . Fracture of sacrum ([place]) 08/04/2016  . Adiposity 05/05/2016  . Parkinson disease ([place]) 04/25/2016  . 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 Bilateral lower extremity edema 07/23/2015  . 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 Chronic kidney disease (CKD), stage III (moderate) 05/11/2015  . Elevated rheumatoid factor 05/11/2015  . Primary osteoarthritis of both hips 05/11/2015  . Episode of syncope 04/24/2015  . History of operative procedure on hip 04/24/2015  . Restless leg 04/24/2015  . Osteoporosis 03/30/2015  . Insomnia 03/30/2015  . Skin cancer 03/30/2015  . MDD (major depressive disorder) 03/30/2015  . Migraine 03/30/2015  . GERD (gastroesophageal reflux disease) 03/30/2015  . IBS (irritable bowel syndrome) 03/30/2015  . OA (osteoarthritis) 03/30/2015  . Skin lesion of chest wall 07/17/2014  . Closed fracture of shaft of femur ([place]) 04/01/2014  . Fracture of bone adjacent to prosthesis 04/01/2014  . Breast cancer ([place]) 03/07/2013  . Personal history of malignant neoplasm of large intestine   . Malignant neoplasm of upper-outer quadrant of female breast ([place])   . Acute anxiety 06/11/2008  . CONSTIPATION, CHRONIC 06/11/2008  . Fibromyalgia 06/11/2008    Past Surgical History:  Procedure Laterality Date  . ABDOMINAL HYSTERECTOMY  1974  . BACK SURGERY  1985   bone spurs between 5&6, used bone  from left hip  . BREAST BIOPSY Left 2011   POS  . BREAST CYST ASPIRATION Left 1985  . BREAST SURGERY Left 2011   wide excision  . CERVICAL DISCECTOMY    . COLON RESECTION  Sept 2014   [facility]   . COLONOSCOPY  2010   Dr. Allen Norris, normal  . FEMUR FRACTURE SURGERY Right March 2016  . JOINT REPLACEMENT Bilateral Jan 2016 and March 2016   hip  . PORT-A-CATH REMOVAL  2013  . PORTACATH PLACEMENT  2011  . SKIN CANCER EXCISION  1980    face  . TUBAL LIGATION  1973  . UPPER GI ENDOSCOPY  08/20/07   gastritis without hemorrhage    Her family history includes Cancer in her brother and other; Cancer (age of onset: 95) in her other; Depression in her sister, sister, and sister; Osteoporosis in her mother; Stroke in her mother.     Outpatient Encounter Prescriptions as of 09/20/2016  Medication Sig Note  . ALPRAZolam (XANAX) 0.5 MG tablet Take 1 tablet (0.5 mg total) by mouth every 4 (four) hours as needed.   Marland Kitchen aspirin-acetaminophen-caffeine (EXCEDRIN MIGRAINE) 250-250-65 MG tablet Take by mouth every 8 (eight) hours as needed. Reported on 06/19/2015 04/24/2015: Medication taken as needed.  Received from: [facility]  . carbidopa-levodopa (SINEMET IR) 25-100 MG tablet Take 2 tablets by mouth 3 (three) times daily.   . mirtazapine (REMERON) 30 MG tablet Take 1 tablet (30 mg total) by mouth at bedtime.   Marland Kitchen omeprazole (PRILOSEC) 20 MG capsule Take 20 mg by mouth daily. Reported on 07/22/2015 04/24/2015: Received from: [facility]  . polyethylene glycol (MIRALAX / GLYCOLAX) packet Take 17 g by mouth daily.   . promethazine (PHENERGAN) 25 MG suppository PLACE 1 SUPPOSITORY (25 MG TOTAL) RECTALLY EVERY 6 (SIX) HOURS AS NEEDED FOR NAUSEA OR VOMITING.   . promethazine (PHENERGAN) 25 MG tablet Take 0.5 tablets (12.5 mg total) by mouth every 6 (six) hours as needed for nausea or vomiting.   . traMADol (ULTRAM) 50 MG tablet TAKE 1 TABLET BY MOUTH EVERY 6 HOURS AS NEEDED    No facility-administered encounter medications on file as of 09/20/2016.     Patient Care Team: Jerrol Banana., MD as PCP - General (Family Medicine) Bary Castilla Forest Gleason, MD (General Surgery) Jerrol Banana., MD (Family Medicine)      Objective:   Vitals: There were no vitals filed for this visit.  Physical Exam  Constitutional: She appears well-developed.  Moderately obese WF sitting in wheelchair.  HENT:  Head:  Normocephalic and atraumatic.  Right Ear: External ear normal.  Left Ear: External ear normal.  Nose: Nose normal.  Eyes: Conjunctivae are normal. No scleral icterus.  Neck: Neck supple. No thyromegaly present.  Cardiovascular: Normal rate, regular rhythm and normal heart sounds.   Pulmonary/Chest: Effort normal and breath sounds normal.  Abdominal: Soft.  Lymphadenopathy:    She has no cervical adenopathy.  Neurological: She is alert. No cranial nerve deficit.  Masked facies c/w dx of Parkinsons.  Skin: Skin is warm and dry.  Psychiatric: She has a normal mood and affect. Her behavior is normal. Judgment and thought content normal.  Mood much better than in previous few years.     Depression Screen PHQ 2/9 Scores 08/04/2016 05/05/2016 03/31/2016 09/01/2015  PHQ - 2 Score 1 1 3 3   PHQ- 9 Score 6 11 17 12       Assessment & Plan:  1. Annual  physical exam Pt decline breast /pelvic/DRE.  2. Parkinson disease ([place])   3. CONSTIPATION, CHRONIC Pt s/p colectomy for constipation.2014.  4. Moderate episode of recurrent major depressive disorder ([place]) In remission with PHQU 9 of 2 today.  5. 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 Fibromyalgia  - CBC with Differential/Platelet - TSH  7. Acute/chronic anxiety  - Comprehensive metabolic panel - TSH  8. Influenza vaccination declined   9. Tetanus, diphtheria, and acellular pertussis (Tdap) vaccination declined   10. Need for hepatitis C screening test  - Hepatitis C antibody  11. Hyperglycemia  - Hemoglobin A1c  12. Other hyperlipidemia  - Comprehensive metabolic panel - Lipid Panel With LDL/HDL Ratio 13.Osteoporosis with multiple fractures Arrange BMD this fall.  I have done the exam and reviewed the chart and it is accurate to the best of my knowledge. Development worker, community has been used and  any errors in dictation or transcription are unintentional. Miguel Aschoff M.D. [facility] I have done the exam and reviewed the chart and it is accurate to the best of my knowledge. Development worker, community has been used and  any errors in dictation or transcription are unintentional. Miguel Aschoff M.D. [facility]

## 2016-09-21 ENCOUNTER — Telehealth: Payer: Self-pay

## 2016-09-21 LAB — COMPREHENSIVE METABOLIC PANEL
ALK PHOS: 108 IU/L (ref 39–117)
ALT: 12 IU/L (ref 0–32)
AST: 16 IU/L (ref 0–40)
Albumin/Globulin Ratio: 1.3 (ref 1.2–2.2)
Albumin: 4.1 g/dL (ref 3.5–4.8)
BUN/Creatinine Ratio: 26 (ref 12–28)
BUN: 25 mg/dL (ref 8–27)
Bilirubin Total: <0.2 mg/dL (ref 0.0–1.2)
CALCIUM: 9.8 mg/dL (ref 8.7–10.3)
CHLORIDE: 101 mmol/L (ref 96–106)
CO2: 23 mmol/L (ref 20–29)
Creatinine, Ser: 0.95 mg/dL (ref 0.57–1.00)
GFR calc Af Amer: 70 mL/min/{1.73_m2} (ref >59)
GFR calc non Af Amer: 60 mL/min/{1.73_m2} (ref >59)
GLOBULIN, TOTAL: 3.2 g/dL (ref 1.5–4.5)
GLUCOSE: 96 mg/dL (ref 65–99)
POTASSIUM: 4.8 mmol/L (ref 3.5–5.2)
Sodium: 140 mmol/L (ref 134–144)
Total Protein: 7.3 g/dL (ref 6.0–8.5)

## 2016-09-21 LAB — CBC WITH DIFFERENTIAL/PLATELET
BASOS ABS: 0 10*3/uL (ref 0.0–0.2)
Basos: 0 %
EOS (ABSOLUTE): 0.3 10*3/uL (ref 0.0–0.4)
EOS: 4 %
HEMATOCRIT: 41.5 % (ref 34.0–46.6)
Hemoglobin: 13.4 g/dL (ref 11.1–15.9)
IMMATURE GRANULOCYTES: 0 %
Immature Grans (Abs): 0 10*3/uL (ref 0.0–0.1)
Lymphocytes Absolute: 2.4 10*3/uL (ref 0.7–3.1)
Lymphs: 36 %
MCH: 30.1 pg (ref 26.6–33.0)
MCHC: 32.3 g/dL (ref 31.5–35.7)
MCV: 93 fL (ref 79–97)
MONOS ABS: 0.6 10*3/uL (ref 0.1–0.9)
Monocytes: 10 %
NEUTROS PCT: 50 %
Neutrophils Absolute: 3.4 10*3/uL (ref 1.4–7.0)
PLATELETS: 275 10*3/uL (ref 150–379)
RBC: 4.45 x10E6/uL (ref 3.77–5.28)
RDW: 13.8 % (ref 12.3–15.4)
WBC: 6.8 10*3/uL (ref 3.4–10.8)

## 2016-09-21 LAB — LIPID PANEL WITH LDL/HDL RATIO
CHOLESTEROL TOTAL: 217 mg/dL — AB (ref 100–199)
HDL: 101 mg/dL (ref >39)
LDL CALC: 91 mg/dL (ref 0–99)
LDl/HDL Ratio: 0.9 ratio (ref 0.0–3.2)
Triglycerides: 125 mg/dL (ref 0–149)
VLDL Cholesterol Cal: 25 mg/dL (ref 5–40)

## 2016-09-21 LAB — TSH: TSH: 1.46 u[IU]/mL (ref 0.450–4.500)

## 2016-09-21 LAB — HEMOGLOBIN A1C
ESTIMATED AVERAGE GLUCOSE: 111 mg/dL
Hgb A1c MFr Bld: 5.5 % (ref 4.8–5.6)

## 2016-09-21 LAB — HEPATITIS C ANTIBODY

## 2016-09-21 NOTE — Telephone Encounter (Signed)
LMTCB 09/21/2016  Thanks,   -Mickel Baas

## 2016-09-21 NOTE — Telephone Encounter (Signed)
-----   Message from Jerrol Banana., MD sent at 09/21/2016  8:29 AM EDT ----- Labs okay

## 2016-09-22 NOTE — Telephone Encounter (Signed)
Pt advised.   Thanks,   -Vita Currin  

## 2016-09-23 DIAGNOSIS — M25551 Pain in right hip: Secondary | ICD-10-CM | POA: Diagnosis not present

## 2016-09-23 DIAGNOSIS — M797 Fibromyalgia: Secondary | ICD-10-CM | POA: Diagnosis not present

## 2016-09-23 DIAGNOSIS — F329 Major depressive disorder, single episode, unspecified: Secondary | ICD-10-CM | POA: Diagnosis not present

## 2016-09-23 DIAGNOSIS — G20 Parkinson's disease: Secondary | ICD-10-CM | POA: Diagnosis not present

## 2016-09-23 DIAGNOSIS — G8929 Other chronic pain: Secondary | ICD-10-CM | POA: Diagnosis not present

## 2016-09-23 DIAGNOSIS — M79651 Pain in right thigh: Secondary | ICD-10-CM | POA: Diagnosis not present

## 2016-09-26 DIAGNOSIS — G8929 Other chronic pain: Secondary | ICD-10-CM | POA: Diagnosis not present

## 2016-09-26 DIAGNOSIS — G20 Parkinson's disease: Secondary | ICD-10-CM | POA: Diagnosis not present

## 2016-09-26 DIAGNOSIS — M797 Fibromyalgia: Secondary | ICD-10-CM | POA: Diagnosis not present

## 2016-09-26 DIAGNOSIS — F329 Major depressive disorder, single episode, unspecified: Secondary | ICD-10-CM | POA: Diagnosis not present

## 2016-09-26 DIAGNOSIS — M25551 Pain in right hip: Secondary | ICD-10-CM | POA: Diagnosis not present

## 2016-09-26 DIAGNOSIS — M79651 Pain in right thigh: Secondary | ICD-10-CM | POA: Diagnosis not present

## 2016-10-10 ENCOUNTER — Other Ambulatory Visit: Payer: Self-pay | Admitting: Family Medicine

## 2016-10-18 ENCOUNTER — Encounter: Payer: Self-pay | Admitting: Family Medicine

## 2016-10-24 ENCOUNTER — Other Ambulatory Visit: Payer: Self-pay | Admitting: Family Medicine

## 2016-10-24 DIAGNOSIS — F419 Anxiety disorder, unspecified: Secondary | ICD-10-CM

## 2016-10-24 MED ORDER — ALPRAZOLAM 0.5 MG PO TABS: 0.5000 mg | ORAL_TABLET | ORAL | 5 refills | Status: DC | PRN

## 2016-10-24 NOTE — Telephone Encounter (Signed)
Rx was refilled [facility] into [facility]. Amparo [place]

## 2016-10-24 NOTE — Telephone Encounter (Signed)
Ok to rf times 5.

## 2016-10-24 NOTE — Telephone Encounter (Signed)
Patient needs refills on Alprazolam 0.5 mg.    [facility]

## 2016-11-29 DIAGNOSIS — G903 Multi-system degeneration of the autonomic nervous system: Secondary | ICD-10-CM | POA: Diagnosis not present

## 2016-11-29 DIAGNOSIS — Z8659 Personal history of other mental and behavioral disorders: Secondary | ICD-10-CM | POA: Diagnosis not present

## 2016-11-29 DIAGNOSIS — G2581 Restless legs syndrome: Secondary | ICD-10-CM | POA: Diagnosis not present

## 2016-11-29 DIAGNOSIS — G20 Parkinson's disease: Secondary | ICD-10-CM | POA: Diagnosis not present

## 2016-11-29 DIAGNOSIS — G4752 REM sleep behavior disorder: Secondary | ICD-10-CM | POA: Diagnosis not present

## 2016-11-29 DIAGNOSIS — G479 Sleep disorder, unspecified: Secondary | ICD-10-CM | POA: Diagnosis not present

## 2016-12-27 DIAGNOSIS — G4752 REM sleep behavior disorder: Secondary | ICD-10-CM | POA: Diagnosis not present

## 2016-12-27 DIAGNOSIS — G903 Multi-system degeneration of the autonomic nervous system: Secondary | ICD-10-CM | POA: Diagnosis not present

## 2016-12-27 DIAGNOSIS — G20 Parkinson's disease: Secondary | ICD-10-CM | POA: Diagnosis not present

## 2016-12-27 DIAGNOSIS — Z8659 Personal history of other mental and behavioral disorders: Secondary | ICD-10-CM | POA: Diagnosis not present

## 2016-12-27 DIAGNOSIS — G2581 Restless legs syndrome: Secondary | ICD-10-CM | POA: Diagnosis not present

## 2017-01-10 ENCOUNTER — Other Ambulatory Visit: Payer: Self-pay | Admitting: Family Medicine

## 2017-01-11 NOTE — Telephone Encounter (Signed)
Can you please review for Dr Rosanna Randy. Thank Edrick Kins, RMA

## 2017-03-14 ENCOUNTER — Encounter: Payer: Self-pay | Admitting: Family Medicine

## 2017-03-14 ENCOUNTER — Ambulatory Visit (INDEPENDENT_AMBULATORY_CARE_PROVIDER_SITE_OTHER): Payer: Medicare Other | Admitting: Family Medicine

## 2017-03-14 VITALS — BP 132/74 | HR 76 | Temp 98.1°F | Resp 20

## 2017-03-14 DIAGNOSIS — F419 Anxiety disorder, unspecified: Secondary | ICD-10-CM

## 2017-03-14 DIAGNOSIS — F331 Major depressive disorder, recurrent, moderate: Secondary | ICD-10-CM | POA: Diagnosis not present

## 2017-03-14 DIAGNOSIS — M797 Fibromyalgia: Secondary | ICD-10-CM

## 2017-03-14 DIAGNOSIS — G4700 Insomnia, unspecified: Secondary | ICD-10-CM | POA: Diagnosis not present

## 2017-03-14 DIAGNOSIS — G20 Parkinson's disease: Secondary | ICD-10-CM | POA: Diagnosis not present

## 2017-03-14 MED ORDER — TRAMADOL HCL 50 MG PO TABS: 50.0000 mg | ORAL_TABLET | Freq: Four times a day (QID) | ORAL | 1 refills | Status: DC | PRN

## 2017-03-14 MED ORDER — ALPRAZOLAM 0.5 MG PO TABS: 0.5000 mg | ORAL_TABLET | ORAL | 5 refills | Status: DC | PRN

## 2017-03-14 NOTE — Progress Notes (Signed)
Patient: Terri Perez Female    DOB: 09/07/1945   72 y.o.   MRN: [id number] Visit Date: 03/14/2017  Today's Provider: [name], MD   Chief Complaint  Patient presents with  . Depression  . Tremors  . Fibromyalgia  . Anxiety   Subjective:    HPI Patient comes in today for a 6 month follow up. No medications were changed since last OV. Labs were stable on 09/20/2016. She feels well today with no other complaints.       Allergies  Allergen Reactions  . Alendronate Nausea Only    Gi symptoms  . Gluten Meal Nausea Only    Other reaction(s): NAUSEA Other reaction(s): NAUSEA Other reaction(s): NAUSEA  . Lac Bovis Nausea And Vomiting  . Milk-Related Compounds   . Nsaids     GI upset.  . Other Other (See Comments)    Other reaction(s): OTHER  . Oxycodone-Acetaminophen   . Oxycodone-Acetaminophen Other (See Comments)    Other reaction(s): NAUSEA  . Vicodin [Hydrocodone-Acetaminophen] Nausea Only, Diarrhea and Nausea And Vomiting  . Wheat Bran Other (See Comments)  . Sulfa Antibiotics Rash    Rash from steri strips  . Tape Rash    Rash from steri strips     Current Outpatient Medications:  .  ALPRAZolam (XANAX) 0.5 MG tablet, Take 1 tablet (0.5 mg total) by mouth every 4 (four) hours as needed., Disp: 90 tablet, Rfl: 5 .  aspirin-acetaminophen-caffeine (EXCEDRIN MIGRAINE) 250-250-65 MG tablet, Take by mouth every 8 (eight) hours as needed. Reported on 06/19/2015, Disp: , Rfl:  .  carbidopa-levodopa (SINEMET IR) 25-100 MG tablet, Take 2 tablets by mouth 3 (three) times daily., Disp: , Rfl: 5 .  mirtazapine (REMERON) 30 MG tablet, TAKE 1 TABLET (30 MG TOTAL) BY MOUTH AT BEDTIME., Disp: 30 tablet, Rfl: 6 .  omeprazole (PRILOSEC) 20 MG capsule, Take 20 mg by mouth daily. Reported on 07/22/2015, Disp: , Rfl:  .  polyethylene glycol (MIRALAX / GLYCOLAX) packet, Take 17 g by mouth daily., Disp: , Rfl:  .  traMADol (ULTRAM) 50 MG tablet, TAKE 1 TABLET BY MOUTH  EVERY 6 HOURS AS NEEDED, Disp: 100 tablet, Rfl: 1 .  promethazine (PHENERGAN) 25 MG suppository, PLACE 1 SUPPOSITORY (25 MG TOTAL) RECTALLY EVERY 6 (SIX) HOURS AS NEEDED FOR NAUSEA OR VOMITING., Disp: 12 suppository, Rfl: 5 .  promethazine (PHENERGAN) 25 MG tablet, Take 0.5 tablets (12.5 mg total) by mouth every 6 (six) hours as needed for nausea or vomiting., Disp: 60 tablet, Rfl: 3  Review of Systems  Constitutional: Positive for fatigue.  Respiratory: Negative for cough, shortness of breath and wheezing.   Cardiovascular: Positive for leg swelling. Negative for chest pain and palpitations.  Gastrointestinal: Negative.   Genitourinary: Negative.   Musculoskeletal: Positive for arthralgias, back pain, gait problem and myalgias.  Skin: Negative.   Neurological: Positive for tremors. Negative for dizziness, seizures, syncope, speech difficulty, numbness and headaches.  Psychiatric/Behavioral: Negative for agitation, confusion, decreased concentration, dysphoric mood, hallucinations, self-injury, sleep disturbance and suicidal ideas. The patient is not nervous/anxious and is not hyperactive.     Social History   Tobacco Use  . Smoking status: Never Smoker  . Smokeless tobacco: Never Used  Substance Use Topics  . Alcohol use: No   Objective:   BP 132/74 (BP Location: Right Arm, Patient Position: Sitting, Cuff Size: Large)   Pulse 76   Temp 98.1 F (36.7 C)   Resp 20  Vitals:  03/14/17 1353  BP: 132/74  Pulse: 76  Resp: 20  Temp: 98.1 F (36.7 C)     Physical Exam  Constitutional: She appears well-developed and well-nourished.  Neck: Normal range of motion. Neck supple.  Cardiovascular: Normal rate, regular rhythm and normal heart sounds.  Pulmonary/Chest: Effort normal and breath sounds normal.  Abdominal: Soft. Bowel sounds are normal.  Musculoskeletal: She exhibits edema.  +1 edema bilaterally   Skin: Skin is warm and dry.  Psychiatric: She has a normal mood and  affect. Her behavior is normal. Judgment and thought content normal.        Assessment & Plan:     1. Parkinson disease ([place])   2. Insomnia, unspecified type   3. Moderate episode of recurrent major depressive disorder ([place])   4. Fibromyalgia   5. Acute/chronic  anxiety  ALPRAZolam (XANAX) 0.5 MG tablet; Take 1 tablet (0.5 mg total) by mouth every 4 (four) hours as needed.  Dispense: 120 tablet; Refill: 5  I have done the exam and reviewed the chart and it is accurate to the best of my knowledge. Development worker, community has been used and  any errors in dictation or transcription are unintentional. Miguel Aschoff M.D. [place], MD  [facility]

## 2017-04-04 DIAGNOSIS — G20 Parkinson's disease: Secondary | ICD-10-CM | POA: Diagnosis not present

## 2017-04-04 DIAGNOSIS — G903 Multi-system degeneration of the autonomic nervous system: Secondary | ICD-10-CM | POA: Diagnosis not present

## 2017-04-04 DIAGNOSIS — G4752 REM sleep behavior disorder: Secondary | ICD-10-CM | POA: Diagnosis not present

## 2017-04-04 DIAGNOSIS — G2581 Restless legs syndrome: Secondary | ICD-10-CM | POA: Diagnosis not present

## 2017-04-21 DIAGNOSIS — I951 Orthostatic hypotension: Secondary | ICD-10-CM | POA: Insufficient documentation

## 2017-04-21 DIAGNOSIS — G903 Multi-system degeneration of the autonomic nervous system: Secondary | ICD-10-CM | POA: Insufficient documentation

## 2017-05-04 ENCOUNTER — Other Ambulatory Visit: Payer: Self-pay

## 2017-05-04 DIAGNOSIS — Z1231 Encounter for screening mammogram for malignant neoplasm of breast: Secondary | ICD-10-CM

## 2017-07-06 ENCOUNTER — Other Ambulatory Visit: Payer: Self-pay | Admitting: Family Medicine

## 2017-07-06 NOTE — Telephone Encounter (Signed)
[facility] the following [facility]. Thanks CC ° °traMADol (ULTRAM) 50 MG tablet  ° °

## 2017-07-10 MED ORDER — TRAMADOL HCL 50 MG PO TABS: 50.0000 mg | ORAL_TABLET | Freq: Four times a day (QID) | ORAL | 2 refills | Status: DC | PRN

## 2017-07-11 ENCOUNTER — Telehealth: Payer: Self-pay | Admitting: Family Medicine

## 2017-07-11 NOTE — Telephone Encounter (Signed)
Error

## 2017-07-13 ENCOUNTER — Ambulatory Visit
Admission: RE | Admit: 2017-07-13 | Discharge: 2017-07-13 | Disposition: A | Discharge status: Home / Self Care | Payer: Medicare Other | Source: Ambulatory Visit | Attending: General Surgery | Admitting: General Surgery

## 2017-07-13 DIAGNOSIS — Z1231 Encounter for screening mammogram for malignant neoplasm of breast: Secondary | ICD-10-CM | POA: Diagnosis not present

## 2017-07-20 ENCOUNTER — Encounter: Payer: Self-pay | Admitting: General Surgery

## 2017-07-20 ENCOUNTER — Ambulatory Visit (INDEPENDENT_AMBULATORY_CARE_PROVIDER_SITE_OTHER): Payer: Medicare Other | Admitting: General Surgery

## 2017-07-20 VITALS — BP 118/82 | HR 102 | Resp 14 | Ht 67.0 in | Wt 220.0 lb

## 2017-07-20 DIAGNOSIS — Z170 Estrogen receptor positive status [ER+]: Secondary | ICD-10-CM | POA: Diagnosis not present

## 2017-07-20 DIAGNOSIS — C50412 Malignant neoplasm of upper-outer quadrant of left female breast: Secondary | ICD-10-CM | POA: Diagnosis not present

## 2017-07-20 NOTE — Patient Instructions (Addendum)
Patient to return to her PCP for mammogram and breast checks.The patient is aware to call back for any questions or concerns.

## 2017-07-20 NOTE — Progress Notes (Signed)
[name], female   DOB: 20-Mar-1945, 72 y.o.   MRN: [id number]  Chief Complaint  Patient presents with  . Follow-up    HPI Terri Perez is a 72 y.o. female who presents for a breast evaluation. The most recent mammogram was done on 07/13/2017.  Patient does perform regular self breast checks and gets regular mammograms done.   She is here with her husband, Jori Moll. HPI  Past Medical History:  Diagnosis Date  . Arm fracture    right forearm, d/t fall  . Bowel trouble 2010  . Breast cancer ([place]) 12/2009   LEFT LUMPECTOMY  . Cancer [facility]) 12/2009   Left UOQ breast tumor; wide excision,sn bx and whole breast radiation. Chemotherapy done. There was only a 30m area of residual tumor remaining. All sn were negative. This was ER-positive, PR-negative, HER-2 neu not over expressed tumor. The original ultrasound size was 2.6 cm(T2).  . H/O cystitis 2011  . Malignant neoplasm of upper-outer quadrant of female breast ([facility] January 13, 2010   2+ centimeter tumor, neoadjuvant chemotherapy. On wide excision 3 mm area of residual tumor. Sentinel node negative. ER-30%, PR-negative, HER-2 not overexpressing.  . [place]   Dr SManuella Ghaziis pt s DR  . Personal history of chemotherapy 2012   BREAST CA  . Personal history of fibromyalgia 2002  . Personal history of malignant neoplasm of large intestine   . Personal history of radiation therapy 2012   BREAST CA  . Special screening for malignant neoplasms, colon   . Unspecified constipation     Past Surgical History:  Procedure Laterality Date  . ABDOMINAL HYSTERECTOMY  1974  . BACK SURGERY  1985   bone spurs between 5&6, used bone from left hip  . BREAST BIOPSY Left 2011   POS  . BREAST CYST ASPIRATION Left 1985  . BREAST LUMPECTOMY Left 2011  . BREAST SURGERY Left 2011   wide excision  . CERVICAL DISCECTOMY    . [facility]  [facility]  . COLONOSCOPY  2010   Dr. WAllen Norris normal  . FEMUR  FRACTURE SURGERY Right March 2016  . JOINT REPLACEMENT Bilateral Jan 2016 and March 2016   hip  . PORT-A-CATH REMOVAL  2013  . PORTACATH PLACEMENT  2011  . SKIN CANCER EXCISION  1980   face  . TUBAL LIGATION  1973  . UPPER GI ENDOSCOPY  08/20/07   gastritis without hemorrhage    Family History  Problem Relation Age of Onset  . Cancer Brother        colon  . Cancer Other        breast and ovarian cancers, relationships not listed  . Stroke Mother   . Osteoporosis Mother   . Depression Sister   . Depression Sister   . Depression Sister   . Cancer Other 551      niece  . Breast cancer Neg Hx     Social History Social History   Tobacco Use  . Smoking status: Never Smoker  . Smokeless tobacco: Never Used  Substance Use Topics  . Alcohol use: No  . Drug use: No    Allergies  Allergen Reactions  . Alendronate Nausea Only    Gi symptoms  . Gluten Meal Nausea Only    Other reaction(s): NAUSEA Other reaction(s): NAUSEA Other reaction(s): NAUSEA  . Lac Bovis Nausea And Vomiting  . Milk-Related Compounds   . Nsaids  GI upset.  . Other Other (See Comments)    Other reaction(s): OTHER  . Oxycodone-Acetaminophen   . Oxycodone-Acetaminophen Other (See Comments)    Other reaction(s): NAUSEA  . Vicodin [Hydrocodone-Acetaminophen] Nausea Only, Diarrhea and Nausea And Vomiting  . Wheat Bran Other (See Comments)  . Sulfa Antibiotics Rash    Rash from steri strips  . Tape Rash    Rash from steri strips    Current Outpatient Medications  Medication Sig Dispense Refill  . ALPRAZolam (XANAX) 0.5 MG tablet Take 1 tablet (0.5 mg total) by mouth every 4 (four) hours as needed. 120 tablet 5  . aspirin-acetaminophen-caffeine (EXCEDRIN MIGRAINE) 308-657-84 MG tablet Take by mouth every 8 (eight) hours as needed. Reported on 06/19/2015    . carbidopa-levodopa (SINEMET IR) 25-100 MG tablet Take 250 tablets by mouth 3 (three) times daily.   5  . mirtazapine (REMERON) 30 MG tablet  TAKE 1 TABLET (30 MG TOTAL) BY MOUTH AT BEDTIME. 30 tablet 6  . omeprazole (PRILOSEC) 20 MG capsule Take 20 mg by mouth daily. Reported on 07/22/2015    . polyethylene glycol (MIRALAX / GLYCOLAX) packet Take 17 g by mouth daily.    . promethazine (PHENERGAN) 25 MG suppository PLACE 1 SUPPOSITORY (25 MG TOTAL) RECTALLY EVERY 6 (SIX) HOURS AS NEEDED FOR NAUSEA OR VOMITING. 12 suppository 5  . promethazine (PHENERGAN) 25 MG tablet Take 0.5 tablets (12.5 mg total) by mouth every 6 (six) hours as needed for nausea or vomiting. 60 tablet 3  . QUEtiapine (SEROQUEL) 25 MG tablet Take 25 mg by mouth at bedtime.    . traMADol (ULTRAM) 50 MG tablet Take 1 tablet (50 mg total) by mouth every 6 (six) hours as needed. 100 tablet 2   No current facility-administered medications for this visit.     Review of Systems Review of Systems  Constitutional: Negative.   Respiratory: Negative.   Cardiovascular: Negative.     Blood pressure 118/82, pulse (!) 102, resp. rate 14, height '5\' 7"'$  (1.702 m), weight 220 lb (99.8 kg), SpO2 97 %.  Physical Exam Physical Exam  Constitutional: She is oriented to person, place, and time. She appears well-developed and well-nourished.  Eyes: Conjunctivae are normal. No scleral icterus.  Neck: Neck supple.  Cardiovascular: Normal rate, regular rhythm and normal heart sounds.  Pulmonary/Chest: Effort normal and breath sounds normal. Right breast exhibits no inverted nipple, no mass, no nipple discharge, no skin change and no tenderness. Left breast exhibits no inverted nipple, no mass, no nipple discharge, no skin change and no tenderness.    Lymphadenopathy:    She has no cervical adenopathy.    She has no axillary adenopathy.  Neurological: She is alert and oriented to person, place, and time.  Skin: Skin is warm and dry.    Data Reviewed Bilateral screening mammograms dated July 13, 2017 reviewed: BI-RADS-1.  Assessment    No evidence of recurrent breast cancer.     Plan Patient has had a stable exam for several years.  She finds it difficult to get about secondary to her fibromyalgia, and requested that she be released from routine follow-up here.  She is amenable to having screening mammograms completed by her primary care provider.  Patient to return to her PCP for mammogram and breast checks.The patient is aware to call back for any questions or concerns.    HPI, Physical Exam, Assessment and Plan have been scribed under the direction and in the presence of Hervey Ard, MD.  Janett Billow  Qualls, CMA I have completed the exam and reviewed the above documentation for accuracy and completeness.  I agree with the above.  Haematologist has been used and any errors in dictation or transcription are unintentional.  Hervey Ard, M.D., F.A.C.S.  Forest Gleason Rhylin Venters 07/21/2017, 7:55 PM

## 2017-08-02 DIAGNOSIS — G2581 Restless legs syndrome: Secondary | ICD-10-CM | POA: Diagnosis not present

## 2017-08-02 DIAGNOSIS — G903 Multi-system degeneration of the autonomic nervous system: Secondary | ICD-10-CM | POA: Diagnosis not present

## 2017-08-02 DIAGNOSIS — G20 Parkinson's disease: Secondary | ICD-10-CM | POA: Diagnosis not present

## 2017-08-02 DIAGNOSIS — G4752 REM sleep behavior disorder: Secondary | ICD-10-CM | POA: Diagnosis not present

## 2017-08-02 DIAGNOSIS — F418 Other specified anxiety disorders: Secondary | ICD-10-CM | POA: Diagnosis not present

## 2017-08-08 ENCOUNTER — Other Ambulatory Visit: Payer: Self-pay | Admitting: Family Medicine

## 2017-08-08 DIAGNOSIS — R110 Nausea: Secondary | ICD-10-CM

## 2017-08-08 MED ORDER — PROMETHAZINE HCL 25 MG PO TABS: 12.5000 mg | ORAL_TABLET | Freq: Four times a day (QID) | ORAL | 3 refills | Status: DC | PRN

## 2017-08-08 NOTE — Telephone Encounter (Signed)
Pt contacted office for refill request on the following medications:  promethazine (PHENERGAN) 25 MG tablet   [facility] [address]  Last Rx: 05/05/16 with 3 refills LOV: 03/14/17 Please advise. Thanks TNP

## 2017-08-24 ENCOUNTER — Telehealth: Payer: Self-pay

## 2017-08-24 NOTE — Telephone Encounter (Signed)
LMTCB and schedule AWV between 09/21/17 - 09/27/17. -MM

## 2017-08-24 NOTE — Telephone Encounter (Signed)
[name] called back saying she did not want to schedule a AWV this year.    teri

## 2017-08-29 NOTE — Telephone Encounter (Signed)
Noted, thank you.  -MM 

## 2017-08-30 ENCOUNTER — Telehealth: Payer: Self-pay

## 2017-08-30 NOTE — Telephone Encounter (Signed)
Mr. Scheu called back confirming that Averly also has vomiting as well.  I called Manuela Schwartz and she states they are able to approve Promethazine for her.    Thanks,   -Mickel Baas

## 2017-08-30 NOTE — Telephone Encounter (Signed)
Suzan Garibaldi with [employer] was calling to clarify Pt's PA for Promethazine.  She states Ms. [employer] will not cover Promethazine unless she has nausea AND vomiting.  It will not cover for just nausea alone.    I left a message for the pt to call back to see if she experiences vomiting.    Susan's [place] contact number is [phone] Ext [phone]  Thanks,   -Mickel Baas

## 2017-08-31 NOTE — Telephone Encounter (Signed)
Thanks! I could not figure out this would not go through! I appreciate your help!

## 2017-09-07 DIAGNOSIS — H2513 Age-related nuclear cataract, bilateral: Secondary | ICD-10-CM | POA: Diagnosis not present

## 2017-09-14 ENCOUNTER — Other Ambulatory Visit: Payer: Self-pay | Admitting: Family Medicine

## 2017-09-14 DIAGNOSIS — F419 Anxiety disorder, unspecified: Secondary | ICD-10-CM

## 2017-09-15 NOTE — Telephone Encounter (Signed)
Pharmacy requesting refills. Thanks!  

## 2017-09-27 ENCOUNTER — Ambulatory Visit (INDEPENDENT_AMBULATORY_CARE_PROVIDER_SITE_OTHER): Payer: Medicare Other | Admitting: Family Medicine

## 2017-09-27 VITALS — BP 136/85 | HR 97 | Temp 98.2°F | Resp 18 | Wt 223.0 lb

## 2017-09-27 DIAGNOSIS — N183 Chronic kidney disease, stage 3 unspecified: Secondary | ICD-10-CM

## 2017-09-27 DIAGNOSIS — E7849 Other hyperlipidemia: Secondary | ICD-10-CM | POA: Diagnosis not present

## 2017-09-27 DIAGNOSIS — G20 Parkinson's disease: Secondary | ICD-10-CM | POA: Diagnosis not present

## 2017-09-27 DIAGNOSIS — F331 Major depressive disorder, recurrent, moderate: Secondary | ICD-10-CM

## 2017-09-27 NOTE — Progress Notes (Signed)
Terri Perez  MRN: [id number] DOB: 10-05-1945  Subjective:  HPI   The patient is a 72 year old female who presents for follow up of chronic health.  She is due for her annual wellness exam but did not want to schedule it with the nurse health advisor she wanted to have it with Dr. Rosanna Randy.  We were unable to reconcile the patient's medication today.  The patient states she brought all of her medicine but then there were several that weren't in her bag and she said she is taking it.  The patient's husband is going to check her medicine at home after the visit and call me to let me know what she is actually taking.   Patient Active Problem List   Diagnosis Date Noted  . Fracture of sacrum ([place]) 08/04/2016  . Adiposity 05/05/2016  . Parkinson disease ([place]) 04/25/2016  . 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 Bilateral lower extremity edema 07/23/2015  . 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 Chronic kidney disease (CKD), stage III (moderate) ([place]) 05/11/2015  . Elevated rheumatoid factor 05/11/2015  . Primary osteoarthritis of both hips 05/11/2015  . Episode of syncope 04/24/2015  . History of operative procedure on hip 04/24/2015  . Restless leg 04/24/2015  . Osteoporosis 03/30/2015  . Insomnia 03/30/2015  . Skin cancer 03/30/2015  . MDD (major depressive disorder) 03/30/2015  . Migraine 03/30/2015  . GERD (gastroesophageal reflux disease) 03/30/2015  . IBS (irritable bowel syndrome) 03/30/2015  . OA (osteoarthritis) 03/30/2015  . Skin lesion of chest wall 07/17/2014  . Closed fracture of shaft of femur ([place]) 04/01/2014  . Fracture of bone adjacent to prosthesis 04/01/2014  . Breast cancer ([place]) 03/07/2013  . Personal history of malignant neoplasm of large intestine   . 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 Acute anxiety 06/11/2008  . CONSTIPATION, CHRONIC 06/11/2008  . Fibromyalgia 06/11/2008    Past Medical History:  Diagnosis  Date  . Arm fracture    right forearm, d/t fall  . Bowel trouble 2010  . Breast cancer ([place]) 12/2009   LEFT LUMPECTOMY  . Cancer [facility]) 12/2009   Left UOQ breast tumor; wide excision,sn bx and whole breast radiation. Chemotherapy done. There was only a 45m area of residual tumor remaining. All sn were negative. This was ER-positive, PR-negative, HER-2 neu not over expressed tumor. The original ultrasound size was 2.6 cm(T2).  . H/O cystitis 2011  . 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 On wide excision 3 mm area of residual tumor. Sentinel node negative. ER-30%, PR-negative, HER-2 not overexpressing.  . [place] disease ([place] 04[place]   Dr SManuella Ghaziis pt s DR  . Personal history of chemotherapy 2012   BREAST CA  . Personal history of fibromyalgia 2002  . Personal history of malignant neoplasm of large intestine   . Personal history of radiation therapy 2012   BREAST CA  . Special screening for malignant neoplasms, colon   . Unspecified constipation     Social History   Socioeconomic History  . Marital status: Married    Spouse name: Not on file  . Number of children: Not on file  . Years of education: Not on file  . Highest education level: Not on file  Occupational History  . Not on file  Social Needs  . Financial resource strain: Not on file  . Food insecurity:  Worry: Not on file    Inability: Not on file  . Transportation needs:    Medical: Not on file    Non-medical: Not on file  Tobacco Use  . Smoking status: Never Smoker  . Smokeless tobacco: Never Used  Substance and Sexual Activity  . Alcohol use: No  . Drug use: No  . Sexual activity: Never  Lifestyle  . Physical activity:    Days per week: Not on file    Minutes per session: Not on file  . Stress: Not on file  Relationships  . Social connections:    Talks on phone: Not on file    Gets together: Not on file     Attends religious service: Not on file    Active member of club or organization: Not on file    Attends meetings of clubs or organizations: Not on file    Relationship status: Not on file  . Intimate partner violence:    Fear of current or ex partner: Not on file    Emotionally abused: Not on file    Physically abused: Not on file    Forced sexual activity: Not on file  Other Topics Concern  . Not on file  Social History Narrative  . Not on file    Outpatient Encounter Medications as of 09/27/2017  Medication Sig Note  . ALPRAZolam (XANAX) 0.5 MG tablet TAKE 1 TABLET BY MOUTH EVERY 4 HOURS AS NEEDED. MAX PER INS   . aspirin-acetaminophen-caffeine (EXCEDRIN MIGRAINE) 250-250-65 MG tablet Take by mouth every 8 (eight) hours as needed. Reported on 06/19/2015 04/24/2015: Medication taken as needed.  Received from: [facility]  . carbidopa-levodopa (SINEMET IR) 25-100 MG tablet Take 250 tablets by mouth 3 (three) times daily.    . mirtazapine (REMERON) 30 MG tablet TAKE 1 TABLET (30 MG TOTAL) BY MOUTH AT BEDTIME.   Marland Kitchen omeprazole (PRILOSEC) 20 MG capsule Take 20 mg by mouth daily. Reported on 07/22/2015 04/24/2015: Received from: [facility]  . polyethylene glycol (MIRALAX / GLYCOLAX) packet Take 17 g by mouth daily.   . promethazine (PHENERGAN) 25 MG suppository PLACE 1 SUPPOSITORY (25 MG TOTAL) RECTALLY EVERY 6 (SIX) HOURS AS NEEDED FOR NAUSEA OR VOMITING.   . promethazine (PHENERGAN) 25 MG tablet Take 0.5 tablets (12.5 mg total) by mouth every 6 (six) hours as needed for nausea or vomiting.   Marland Kitchen QUEtiapine (SEROQUEL) 25 MG tablet Take 25 mg by mouth at bedtime.   . traMADol (ULTRAM) 50 MG tablet Take 1 tablet (50 mg total) by mouth every 6 (six) hours as needed.    No facility-administered encounter medications on file as of 09/27/2017.     Allergies  Allergen Reactions  . Alendronate Nausea Only    Gi symptoms  . Gluten Meal Nausea Only    Other  reaction(s): NAUSEA Other reaction(s): NAUSEA Other reaction(s): NAUSEA  . Lac Bovis Nausea And Vomiting  . Milk-Related Compounds   . Nsaids     GI upset.  . Other Other (See Comments)    Other reaction(s): OTHER  . Oxycodone-Acetaminophen   . Oxycodone-Acetaminophen Other (See Comments)    Other reaction(s): NAUSEA  . Vicodin [Hydrocodone-Acetaminophen] Nausea Only, Diarrhea and Nausea And Vomiting  . Wheat Bran Other (See Comments)  . Sulfa Antibiotics Rash    Rash from steri strips  . Tape Rash    Rash from steri strips    Review of Systems  Constitutional: Positive for malaise/fatigue.  HENT: Positive for sinus  pain.        Sneezing  Eyes:       Itching  Respiratory: Negative.   Cardiovascular: Negative.   Gastrointestinal: Positive for nausea.  Genitourinary: Negative.   Musculoskeletal: Positive for myalgias.       Gait problems  Neurological: Positive for dizziness, weakness and headaches.  Psychiatric/Behavioral: The patient is nervous/anxious.     Objective:  BP 136/85 (BP Location: Right Arm, Patient Position: Sitting, Cuff Size: Large)   Pulse 97   Temp 98.2 F (36.8 C) (Oral)   Resp 18   Wt 223 lb (101.2 kg) Comment: patient reported  BMI 34.93 kg/m   Physical Exam  Constitutional: She is oriented to person, place, and time and well-developed, well-nourished, and in no distress.  HENT:  Head: Normocephalic and atraumatic.  Right Ear: External ear normal.  Left Ear: External ear normal.  Nose: Nose normal.  Mouth/Throat: Oropharynx is clear and moist.  Eyes: Conjunctivae are normal. No scleral icterus.  Neck: No thyromegaly present.  Cardiovascular: Normal rate, regular rhythm and normal heart sounds.  Pulmonary/Chest: Effort normal and breath sounds normal.  Abdominal: Soft.  Musculoskeletal: She exhibits no edema.  Neurological: She is alert and oriented to person, place, and time. Gait normal. GCS score is 15.  Skin: Skin is warm and dry.    Psychiatric: Mood, memory, affect and judgment normal.    Assessment and Plan :  1. Other hyperlipidemia  - Lipid panel - TSH  2. Moderate episode of recurrent major depressive disorder (HCC)  - CBC with Differential/Platelet  3. Chronic kidney disease (CKD), stage III (moderate) (HCC)  - Comprehensive metabolic panel  4. Parkinson disease ([place])  5.Chronic Anxiety  I have done the exam and reviewed the chart and it is accurate to the best of my knowledge. Development worker, community has been used and  any errors in dictation or transcription are unintentional. Miguel Aschoff M.D. [facility]

## 2017-09-28 LAB — COMPREHENSIVE METABOLIC PANEL
ALT: 5 IU/L (ref 0–32)
AST: 15 IU/L (ref 0–40)
Albumin/Globulin Ratio: 1.4 (ref 1.2–2.2)
Albumin: 4.3 g/dL (ref 3.5–4.8)
Alkaline Phosphatase: 96 IU/L (ref 39–117)
BUN/Creatinine Ratio: 24 (ref 12–28)
BUN: 25 mg/dL (ref 8–27)
Bilirubin Total: 0.2 mg/dL (ref 0.0–1.2)
CALCIUM: 9.6 mg/dL (ref 8.7–10.3)
CO2: 21 mmol/L (ref 20–29)
Chloride: 102 mmol/L (ref 96–106)
Creatinine, Ser: 1.03 mg/dL — ABNORMAL HIGH (ref 0.57–1.00)
GFR calc Af Amer: 63 mL/min/{1.73_m2} (ref >59)
GFR, EST NON AFRICAN AMERICAN: 54 mL/min/{1.73_m2} — AB (ref >59)
GLUCOSE: 91 mg/dL (ref 65–99)
Globulin, Total: 3.1 g/dL (ref 1.5–4.5)
POTASSIUM: 4.4 mmol/L (ref 3.5–5.2)
Sodium: 139 mmol/L (ref 134–144)
Total Protein: 7.4 g/dL (ref 6.0–8.5)

## 2017-09-28 LAB — CBC WITH DIFFERENTIAL/PLATELET
BASOS ABS: 0 10*3/uL (ref 0.0–0.2)
BASOS: 0 %
EOS (ABSOLUTE): 0.2 10*3/uL (ref 0.0–0.4)
EOS: 3 %
HEMOGLOBIN: 13 g/dL (ref 11.1–15.9)
Hematocrit: 39.5 % (ref 34.0–46.6)
IMMATURE GRANS (ABS): 0 10*3/uL (ref 0.0–0.1)
Immature Granulocytes: 0 %
Lymphocytes Absolute: 2.2 10*3/uL (ref 0.7–3.1)
Lymphs: 26 %
MCH: 31.6 pg (ref 26.6–33.0)
MCHC: 32.9 g/dL (ref 31.5–35.7)
MCV: 96 fL (ref 79–97)
MONOCYTES: 7 %
Monocytes Absolute: 0.6 10*3/uL (ref 0.1–0.9)
NEUTROS ABS: 5.4 10*3/uL (ref 1.4–7.0)
Neutrophils: 64 %
Platelets: 254 10*3/uL (ref 150–450)
RBC: 4.11 x10E6/uL (ref 3.77–5.28)
RDW: 12.7 % (ref 12.3–15.4)
WBC: 8.5 10*3/uL (ref 3.4–10.8)

## 2017-09-28 LAB — LIPID PANEL
Chol/HDL Ratio: 2.5 ratio (ref 0.0–4.4)
Cholesterol, Total: 229 mg/dL — ABNORMAL HIGH (ref 100–199)
HDL: 93 mg/dL (ref >39)
LDL Calculated: 116 mg/dL — ABNORMAL HIGH (ref 0–99)
Triglycerides: 99 mg/dL (ref 0–149)
VLDL CHOLESTEROL CAL: 20 mg/dL (ref 5–40)

## 2017-09-28 LAB — TSH: TSH: 1.59 u[IU]/mL (ref 0.450–4.500)

## 2017-09-29 ENCOUNTER — Other Ambulatory Visit: Payer: Self-pay | Admitting: Family Medicine

## 2017-09-29 MED ORDER — QUETIAPINE FUMARATE 25 MG PO TABS: 25.0000 mg | ORAL_TABLET | Freq: Every day | ORAL | 11 refills | Status: DC

## 2017-09-29 NOTE — Telephone Encounter (Signed)
Pt needs refill on  Quetiapine fumarate 25 mg  [facility]

## 2018-01-29 ENCOUNTER — Other Ambulatory Visit: Payer: Self-pay

## 2018-01-29 NOTE — Telephone Encounter (Signed)
Patient called office requesting [facility] [address] she would like for CMA tos give her a call back when prescription has been sent. KW

## 2018-01-30 ENCOUNTER — Other Ambulatory Visit: Payer: Self-pay | Admitting: Family Medicine

## 2018-01-30 NOTE — Telephone Encounter (Signed)
Please review

## 2018-01-31 MED ORDER — TRAMADOL HCL 50 MG PO TABS: 50.0000 mg | ORAL_TABLET | Freq: Four times a day (QID) | ORAL | 2 refills | Status: DC | PRN

## 2018-01-31 NOTE — Telephone Encounter (Signed)
[facility] needing the [facility]ill [facility]    Please advise.  Thanks, [employer]

## 2018-01-31 NOTE — Telephone Encounter (Signed)
Called to advise patient. Patient would like it sent into [facility] instead. Please review. Thanks!

## 2018-03-13 ENCOUNTER — Other Ambulatory Visit: Payer: Self-pay | Admitting: Family Medicine

## 2018-03-13 DIAGNOSIS — F419 Anxiety disorder, unspecified: Secondary | ICD-10-CM

## 2018-03-13 NOTE — Telephone Encounter (Signed)
[name] walked in asking for a refill on her  Alprazolam  0.5 mg  [facility]  CB#  [phone]  thanks Con Memos

## 2018-03-14 MED ORDER — ALPRAZOLAM 0.5 MG PO TABS: ORAL_TABLET | ORAL | 0 refills | Status: DC

## 2018-04-11 ENCOUNTER — Other Ambulatory Visit: Payer: Self-pay | Admitting: Family Medicine

## 2018-04-11 ENCOUNTER — Ambulatory Visit: Payer: Self-pay | Admitting: Family Medicine

## 2018-04-11 DIAGNOSIS — F419 Anxiety disorder, unspecified: Secondary | ICD-10-CM

## 2018-05-24 ENCOUNTER — Ambulatory Visit (INDEPENDENT_AMBULATORY_CARE_PROVIDER_SITE_OTHER): Payer: Medicare Other | Admitting: Family Medicine

## 2018-05-24 ENCOUNTER — Encounter: Payer: Self-pay | Admitting: Family Medicine

## 2018-05-24 ENCOUNTER — Other Ambulatory Visit: Payer: Self-pay

## 2018-05-24 VITALS — BP 122/70 | HR 96 | Temp 98.6°F | Resp 16 | Wt 226.0 lb

## 2018-05-24 DIAGNOSIS — M810 Age-related osteoporosis without current pathological fracture: Secondary | ICD-10-CM

## 2018-05-24 DIAGNOSIS — E669 Obesity, unspecified: Secondary | ICD-10-CM

## 2018-05-24 DIAGNOSIS — H6123 Impacted cerumen, bilateral: Secondary | ICD-10-CM

## 2018-05-24 DIAGNOSIS — G20 Parkinson's disease: Secondary | ICD-10-CM

## 2018-05-24 DIAGNOSIS — N183 Chronic kidney disease, stage 3 unspecified: Secondary | ICD-10-CM

## 2018-05-24 DIAGNOSIS — F419 Anxiety disorder, unspecified: Secondary | ICD-10-CM

## 2018-05-24 DIAGNOSIS — J069 Acute upper respiratory infection, unspecified: Secondary | ICD-10-CM

## 2018-05-24 DIAGNOSIS — F331 Major depressive disorder, recurrent, moderate: Secondary | ICD-10-CM

## 2018-05-24 DIAGNOSIS — M160 Bilateral primary osteoarthritis of hip: Secondary | ICD-10-CM

## 2018-05-24 DIAGNOSIS — C50419 Malignant neoplasm of upper-outer quadrant of unspecified female breast: Secondary | ICD-10-CM

## 2018-05-24 NOTE — Patient Instructions (Signed)
Try Robitussin OTC twice daily for congestion/cough/

## 2018-05-24 NOTE — Progress Notes (Signed)
Patient: Terri Perez Female    DOB: 1945-12-09   73 y.o.   MRN: [id number] Visit Date: 05/24/2018  Today's Provider: [name], MD   Chief Complaint  Patient presents with  . Ear Pain   Subjective:     Otalgia   There is pain in both ears. This is a new problem. The current episode started 1 to 4 weeks ago (about 2 weeks). The problem occurs constantly. The problem has been unchanged. There has been no fever. The pain is mild. Associated symptoms include ear discharge and rhinorrhea. Pertinent negatives include no coughing or headaches. She has tried acetaminophen (also tried ear washing kit) for the symptoms. The treatment provided no relief.   Patient feels that it may be allergies or the beginning stages of a sinus infection.  Allergies  Allergen Reactions  . Alendronate Nausea Only    Gi symptoms  . Gluten Meal Nausea Only    Other reaction(s): NAUSEA Other reaction(s): NAUSEA Other reaction(s): NAUSEA  . Lac Bovis Nausea And Vomiting  . Milk-Related Compounds   . Nsaids     GI upset.  . Other Other (See Comments)    Other reaction(s): OTHER  . Oxycodone-Acetaminophen   . Oxycodone-Acetaminophen Other (See Comments)    Other reaction(s): NAUSEA  . Vicodin [Hydrocodone-Acetaminophen] Nausea Only, Diarrhea and Nausea And Vomiting  . Wheat Bran Other (See Comments)  . Sulfa Antibiotics Rash    Rash from steri strips  . Tape Rash    Rash from steri strips     Current Outpatient Medications:  .  ALPRAZolam (XANAX) 0.5 MG tablet, TAKE 1 TABLET BY MOUTH EVERY 4 HOURS AS NEEDED. MAX PER INSURANCE, Disp: 120 tablet, Rfl: 4 .  aspirin-acetaminophen-caffeine (EXCEDRIN MIGRAINE) 268-341-96 MG tablet, Take by mouth every 8 (eight) hours as needed. Reported on 06/19/2015, Disp: , Rfl:  .  carbidopa-levodopa (SINEMET IR) 25-100 MG tablet, Take 250 tablets by mouth 3 (three) times daily. , Disp: , Rfl: 5 .  mirtazapine (REMERON) 30 MG tablet, TAKE 1 TABLET  (30 MG TOTAL) BY MOUTH AT BEDTIME., Disp: 30 tablet, Rfl: 6 .  omeprazole (PRILOSEC) 20 MG capsule, Take 20 mg by mouth daily. Reported on 07/22/2015, Disp: , Rfl:  .  polyethylene glycol (MIRALAX / GLYCOLAX) packet, Take 17 g by mouth daily., Disp: , Rfl:  .  promethazine (PHENERGAN) 25 MG tablet, Take 0.5 tablets (12.5 mg total) by mouth every 6 (six) hours as needed for nausea or vomiting., Disp: 60 tablet, Rfl: 3 .  QUEtiapine (SEROQUEL) 25 MG tablet, Take 1 tablet (25 mg total) by mouth at bedtime., Disp: 30 tablet, Rfl: 11 .  traMADol (ULTRAM) 50 MG tablet, Take 1 tablet (50 mg total) by mouth every 6 (six) hours as needed., Disp: 100 tablet, Rfl: 2 .  promethazine (PHENERGAN) 25 MG suppository, PLACE 1 SUPPOSITORY (25 MG TOTAL) RECTALLY EVERY 6 (SIX) HOURS AS NEEDED FOR NAUSEA OR VOMITING. (Patient not taking: Reported on 05/24/2018), Disp: 12 suppository, Rfl: 5  Review of Systems  Constitutional: Negative.   HENT: Positive for ear discharge, ear pain and rhinorrhea.   Eyes: Negative.   Respiratory: Negative for cough.   Cardiovascular: Negative.   Endocrine: Negative.   Allergic/Immunologic: Negative.   Neurological: Positive for tremors and weakness. Negative for headaches.  Psychiatric/Behavioral: Negative.     Social History   Tobacco Use  . Smoking status: Never Smoker  . Smokeless tobacco: Never Used  Substance Use Topics  .  Alcohol use: No      Objective:   BP 122/70 (BP Location: Left Arm, Patient Position: Sitting, Cuff Size: Large)   Pulse 96   Temp 98.6 F (37 C)   Resp 16   Wt 226 lb (102.5 kg) Comment: per patient  SpO2 98%   BMI 35.40 kg/m  Vitals:   05/24/18 1334  BP: 122/70  Pulse: 96  Resp: 16  Temp: 98.6 F (37 C)  SpO2: 98%  Weight: 226 lb (102.5 kg)     Physical Exam Vitals signs reviewed.  Constitutional:      Appearance: She is obese.  HENT:     Head: Normocephalic and atraumatic.     Right Ear: Tympanic membrane and external ear  normal.     Left Ear: Tympanic membrane and external ear normal.     Ears:     Comments: Both EACs blocked with cerumen--cleared after irrigation.    Nose: Nose normal.  Eyes:     General: No scleral icterus.    Conjunctiva/sclera: Conjunctivae normal.  Cardiovascular:     Rate and Rhythm: Normal rate and regular rhythm.     Heart sounds: Normal heart sounds.  Pulmonary:     Effort: Pulmonary effort is normal.     Breath sounds: Normal breath sounds.  Abdominal:     Palpations: Abdomen is soft.  Skin:    General: Skin is warm and dry.  Neurological:     Mental Status: She is alert.         Assessment & Plan    1. Bilateral impacted cerumen Cleared after irrigation.  2. Upper respiratory tract infection, unspecified type Robitussin DM BID.Certainly no s/s of Covid.  3. Parkinson disease ([place]) Pt in wheelchair.  4. Chronic kidney disease (CKD), stage III (moderate) ([place])   5. Primary osteoarthritis of both hips   6. Involutional osteoporosis Follow BMD every 2-3 years  7. 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 Moderate episode of recurrent major depressive disorder (HCC) In partial remission.RTC 4-6 months.  9. Obesity without serious comorbidity, unspecified classification, unspecified obesity type   10. Acute/Chronic anxiety     I have done the exam and reviewed the chart and it is accurate to the best of my knowledge. Development worker, community has been used and  any errors in dictation or transcription are unintentional. Miguel Aschoff M.D. [place], MD  [facility]

## 2018-06-07 ENCOUNTER — Ambulatory Visit: Payer: Medicare Other | Admitting: Family Medicine

## 2018-07-11 ENCOUNTER — Other Ambulatory Visit: Payer: Self-pay | Admitting: Family Medicine

## 2018-07-11 NOTE — Telephone Encounter (Signed)
Please review

## 2018-07-12 ENCOUNTER — Other Ambulatory Visit: Payer: Self-pay | Admitting: Family Medicine

## 2018-07-12 DIAGNOSIS — Z1231 Encounter for screening mammogram for malignant neoplasm of breast: Secondary | ICD-10-CM

## 2018-07-16 ENCOUNTER — Ambulatory Visit (INDEPENDENT_AMBULATORY_CARE_PROVIDER_SITE_OTHER): Payer: Medicare Other | Admitting: Podiatry

## 2018-07-16 ENCOUNTER — Other Ambulatory Visit: Payer: Self-pay

## 2018-07-16 ENCOUNTER — Ambulatory Visit: Payer: Medicare Other | Admitting: Podiatry

## 2018-07-16 ENCOUNTER — Encounter: Payer: Self-pay | Admitting: Podiatry

## 2018-07-16 VITALS — Temp 98.2°F

## 2018-07-16 DIAGNOSIS — M79676 Pain in unspecified toe(s): Secondary | ICD-10-CM | POA: Diagnosis not present

## 2018-07-16 DIAGNOSIS — B351 Tinea unguium: Secondary | ICD-10-CM | POA: Diagnosis not present

## 2018-07-16 NOTE — Progress Notes (Signed)
Subjective:  Patient ID: OSHA RANE, female    DOB: August 30, 1945,  MRN: [id number] HPI Chief Complaint  Patient presents with  . Debridement    Requesting nail care - can't cut herself and says salons don't cut them good enough  . New Patient (Initial Visit)    Est pt 02/2015    73 y.o. female presents with the above complaint.   ROS: Denies fever chills nausea vomiting muscle aches pains calf pain back pain chest pain shortness of breath.  Past Medical History:  Diagnosis Date  . Arm fracture    right forearm, d/t fall  . Bowel trouble 2010  . Breast cancer ([place]) 12/2009   LEFT LUMPECTOMY  . Cancer [facility]) 12/2009   Left UOQ breast tumor; wide excision,sn bx and whole breast radiation. Chemotherapy done. There was only a 8m area of residual tumor remaining. All sn were negative. This was ER-positive, PR-negative, HER-2 neu not over expressed tumor. The original ultrasound size was 2.6 cm(T2).  . H/O cystitis 2011  . 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 On wide excision 3 mm area of residual tumor. Sentinel node negative. ER-30%, PR-negative, HER-2 not overexpressing.  . [place] 04[place]   Dr SManuella Ghaziis pt s DR  . Personal history of chemotherapy 2012   BREAST CA  . Personal history of fibromyalgia 2002  . Personal history of malignant neoplasm of large intestine   . Personal history of radiation therapy 2012   BREAST CA  . Special screening for malignant neoplasms, colon   . Unspecified constipation    Past Surgical History:  Procedure Laterality Date  . ABDOMINAL HYSTERECTOMY  1974  . BACK SURGERY  1985   bone spurs between 5&6, used bone from left hip  . BREAST BIOPSY Left 2011   POS  . BREAST CYST ASPIRATION Left 1985  . BREAST LUMPECTOMY Left 2011  . BREAST SURGERY Left 2011   wide excision  . CERVICAL DISCECTOMY    . [facility]  [facility]  . COLONOSCOPY  2010   Dr. WAllen Norris normal  . FEMUR FRACTURE SURGERY Right March 2016  . JOINT REPLACEMENT Bilateral Jan 2016 and March 2016   hip  . PORT-A-CATH REMOVAL  2013  . PORTACATH PLACEMENT  2011  . SKIN CANCER EXCISION  1980   face  . TUBAL LIGATION  1973  . UPPER GI ENDOSCOPY  08/20/07   gastritis without hemorrhage    Current Outpatient Medications:  .  rOPINIRole (REQUIP) 1 MG tablet, Take 1 mg by mouth 3 (three) times daily., Disp: , Rfl:  .  ALPRAZolam (XANAX) 0.5 MG tablet, TAKE 1 TABLET BY MOUTH EVERY 4 HOURS AS NEEDED. Quina Wilbourne PER INSURANCE, Disp: 120 tablet, Rfl: 4 .  aspirin-acetaminophen-caffeine (EXCEDRIN MIGRAINE) 2300-923-30MG tablet, Take by mouth every 8 (eight) hours as needed. Reported on 06/19/2015, Disp: , Rfl:  .  carbidopa-levodopa (SINEMET IR) 25-100 MG tablet, Take 250 tablets by mouth 3 (three) times daily. , Disp: , Rfl: 5 .  traMADol (ULTRAM) 50 MG tablet, TAKE 1 TABLET (50 MG TOTAL) BY MOUTH EVERY 6 (SIX) HOURS AS NEEDED., Disp: 100 tablet, Rfl: 2  Allergies  Allergen Reactions  . Alendronate Nausea Only    Gi symptoms  . Gluten Meal Nausea Only    Other reaction(s): NAUSEA Other reaction(s): NAUSEA Other reaction(s): NAUSEA  . Lac Bovis Nausea And  Vomiting  . Milk-Related Compounds   . Nsaids     GI upset. Other reaction(s): Other (See Comments) Pt unsure  . Other Other (See Comments)    Other reaction(s): OTHER  . Oxycodone-Acetaminophen   . Oxycodone-Acetaminophen Other (See Comments)    Other reaction(s): NAUSEA  . Vicodin [Hydrocodone-Acetaminophen] Nausea Only, Diarrhea and Nausea And Vomiting  . Wheat Bran Other (See Comments)  . Sulfa Antibiotics Rash    Rash from steri strips  . Tape Rash    Rash from steri strips   Review of Systems Objective:   Vitals:   07/16/18 1404  Temp: 98.2 F (36.8 C)    General: Well developed, nourished, in no acute distress, alert and oriented x3   Dermatological: Skin is warm, dry and  supple bilateral. Nails x 10 are well maintained; remaining integument appears unremarkable at this time. There are no open sores, no preulcerative lesions, no rash or signs of infection present.  Nails are thick yellow dystrophic-like mycotic sharply incurvated and elongated.  They are painful on palpation and debridement.  Vascular: Dorsalis Pedis artery and Posterior Tibial artery pedal pulses are 2/4 bilateral with immedate capillary fill time. Pedal hair growth present. No varicosities and no lower extremity edema present bilateral.   Neruologic: Grossly intact via light touch bilateral. Vibratory intact via tuning fork bilateral. Protective threshold with Semmes Wienstein monofilament intact to all pedal sites bilateral. Patellar and Achilles deep tendon reflexes 2+ bilateral. No Babinski or clonus noted bilateral.   Musculoskeletal: No gross boney pedal deformities bilateral. No pain, crepitus, or limitation noted with foot and ankle range of motion bilateral. Muscular strength 5/5 in all groups tested bilateral.  Hammertoe deformities rigid in nature bilateral.  Hallux valgus deformity left foot.  Gait: Unassisted, Nonantalgic.    Radiographs:  None taken  Assessment & Plan:   Assessment: Painful elongated nails 1 through 5 bilaterally.  Pain limb secondary to onychomycosis.  Plan: Discussed etiology pathology conservative versus surgical therapies.  Debrided toenails 1 through 5 bilateral.     Elleigh Cassetta T. [place], [place]

## 2018-09-06 ENCOUNTER — Other Ambulatory Visit: Payer: Self-pay | Admitting: Family Medicine

## 2018-09-06 DIAGNOSIS — F419 Anxiety disorder, unspecified: Secondary | ICD-10-CM

## 2018-09-24 ENCOUNTER — Encounter: Payer: Self-pay | Admitting: Podiatry

## 2018-09-24 ENCOUNTER — Ambulatory Visit (INDEPENDENT_AMBULATORY_CARE_PROVIDER_SITE_OTHER): Payer: Medicare Other | Admitting: Podiatry

## 2018-09-24 ENCOUNTER — Other Ambulatory Visit: Payer: Self-pay

## 2018-09-24 DIAGNOSIS — M79676 Pain in unspecified toe(s): Secondary | ICD-10-CM

## 2018-09-24 DIAGNOSIS — B351 Tinea unguium: Secondary | ICD-10-CM

## 2018-09-24 NOTE — Progress Notes (Signed)
Complaint:  Visit Type: Patient returns to my office for continued preventative foot care services. Complaint: Patient states" my nails have grown long and thick and become painful to walk and wear shoes" Patient has been diagnosed with DM with no foot complications. The patient presents for preventative foot care services. No changes to ROS.  Patient has Parkinsons.  Podiatric Exam: Vascular: dorsalis pedis and posterior tibial pulses are palpable bilateral. Capillary return is immediate. Temperature gradient is WNL. Skin turgor WNL  Sensorium: Normal Semmes Weinstein monofilament test. Normal tactile sensation bilaterally. Nail Exam: Pt has thick disfigured discolored nails with subungual debris noted bilateral entire nail hallux through fifth toenails Ulcer Exam: There is no evidence of ulcer or pre-ulcerative changes or infection. Orthopedic Exam: Muscle tone and strength are WNL. No limitations in general ROM. No crepitus or effusions noted. Foot type and digits show no abnormalities. Bony prominences are unremarkable. Skin: No Porokeratosis. No infection or ulcers  Diagnosis:  Onychomycosis, , Pain in right toe, pain in left toes  Treatment & Plan Procedures and Treatment: Consent by patient was obtained for treatment procedures.   Debridement of mycotic and hypertrophic toenails, 1 through 5 bilateral and clearing of subungual debris. No ulceration, no infection noted.  Return Visit-Office Procedure: Patient instructed to return to the office for a follow up visit 9 weeks  for continued evaluation and treatment.    Gardiner Barefoot DPM

## 2018-10-08 ENCOUNTER — Ambulatory Visit
Admission: RE | Admit: 2018-10-08 | Discharge: 2018-10-08 | Disposition: A | Discharge status: Home / Self Care | Payer: Medicare Other | Source: Ambulatory Visit | Attending: Family Medicine | Admitting: Family Medicine

## 2018-10-08 DIAGNOSIS — Z1231 Encounter for screening mammogram for malignant neoplasm of breast: Secondary | ICD-10-CM | POA: Insufficient documentation

## 2018-11-20 NOTE — Progress Notes (Deleted)
       Patient: Terri Perez Female    DOB: May 10, 1945   73 y.o.   MRN: [id number] Visit Date: 11/20/2018  Today's Provider: [name], MD   No chief complaint on file.  Subjective:     HPI   Other hyperlipidemia From 09/27/2017-labs stable.   Parkinson disease ([place]) From 05/24/2018-Pt in wheelchair.  Chronic kidney disease (CKD), stage III (moderate) (HCC) From 09/27/2017-labs stable  Primary osteoarthritis of both hips From 05/24/2018-Follow BMD every 2-3 years.  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  Moderate episode of recurrent major depressive disorder ([place]) From 05/24/2018-In partial remission.RTC 4-6 months.     Allergies  Allergen Reactions  . Alendronate Nausea Only    Gi symptoms  . Gluten Meal Nausea Only    Other reaction(s): NAUSEA Other reaction(s): NAUSEA Other reaction(s): NAUSEA  . Lac Bovis Nausea And Vomiting  . Milk-Related Compounds   . Nsaids     GI upset. Other reaction(s): Other (See Comments) Pt unsure  . Other Other (See Comments)    Other reaction(s): OTHER  . Oxycodone-Acetaminophen   . Oxycodone-Acetaminophen Other (See Comments)    Other reaction(s): NAUSEA  . Vicodin [Hydrocodone-Acetaminophen] Nausea Only, Diarrhea and Nausea And Vomiting  . Wheat Bran Other (See Comments)  . Sulfa Antibiotics Rash    Rash from steri strips  . Tape Rash    Rash from steri strips     Current Outpatient Medications:  .  ALPRAZolam (XANAX) 0.5 MG tablet, TAKE 1 TABLET BY MOUTH EVERY 4 HOURS AS NEEDED. MAX PER INSURANCE, Disp: 120 tablet, Rfl: 4 .  aspirin-acetaminophen-caffeine (EXCEDRIN MIGRAINE) [id number] MG tablet, Take by mouth every 8 (eight) hours as needed. Reported on 06/19/2015, Disp: , Rfl:  .  carbidopa-levodopa (SINEMET IR) 25-100 MG tablet, Take 250 tablets by mouth 3 (three) times daily. , Disp: , Rfl: 5 .  rOPINIRole (REQUIP) 1 MG tablet, Take 1 mg by mouth 3 (three) times  daily., Disp: , Rfl:  .  traMADol (ULTRAM) 50 MG tablet, TAKE 1 TABLET (50 MG TOTAL) BY MOUTH EVERY 6 (SIX) HOURS AS NEEDED., Disp: 100 tablet, Rfl: 2  Review of Systems  Constitutional: Negative for appetite change, chills, fatigue and fever.  Respiratory: Negative for chest tightness and shortness of breath.   Cardiovascular: Negative for chest pain and palpitations.  Gastrointestinal: Negative for abdominal pain, nausea and vomiting.  Neurological: Negative for dizziness and weakness.    Social History   Tobacco Use  . Smoking status: Never Smoker  . Smokeless tobacco: Never Used  Substance Use Topics  . Alcohol use: No      Objective:   There were no vitals taken for this visit. There were no vitals filed for this visit.There is no height or weight on file to calculate BMI.   Physical Exam   No results found for any visits on 11/22/18.     Assessment & Plan        [name], MD  [facility]

## 2018-11-22 ENCOUNTER — Ambulatory Visit: Payer: Self-pay | Admitting: Family Medicine

## 2018-12-03 ENCOUNTER — Other Ambulatory Visit: Payer: Self-pay

## 2018-12-03 ENCOUNTER — Ambulatory Visit (INDEPENDENT_AMBULATORY_CARE_PROVIDER_SITE_OTHER): Payer: Medicare Other | Admitting: Podiatry

## 2018-12-03 ENCOUNTER — Encounter: Payer: Self-pay | Admitting: Podiatry

## 2018-12-03 DIAGNOSIS — B351 Tinea unguium: Secondary | ICD-10-CM

## 2018-12-03 DIAGNOSIS — Q6689 Other  specified congenital deformities of feet: Secondary | ICD-10-CM | POA: Diagnosis not present

## 2018-12-03 DIAGNOSIS — M79676 Pain in unspecified toe(s): Secondary | ICD-10-CM | POA: Diagnosis not present

## 2018-12-03 NOTE — Progress Notes (Signed)
Complaint:  Visit Type: Patient returns to my office for continued preventative foot care services. Complaint: Patient states" my nails have grown long and thick and become painful to walk and wear shoes" Patient has been diagnosed with DM with no foot complications. The patient presents for preventative foot care services. No changes to ROS.  Patient has Parkinsons. Patient says her fourth toe left foot is painful.  Podiatric Exam: Vascular: dorsalis pedis and posterior tibial pulses are palpable bilateral. Capillary return is immediate. Temperature gradient is WNL. Skin turgor WNL  Sensorium: Normal Semmes Weinstein monofilament test. Normal tactile sensation bilaterally. Nail Exam: Pt has thick disfigured discolored nails with subungual debris noted bilateral entire nail hallux through fifth toenails Ulcer Exam: There is no evidence of ulcer or pre-ulcerative changes or infection. Orthopedic Exam: Muscle tone and strength are WNL. No limitations in general ROM. No crepitus or effusions noted. Foot type and digits show no abnormalities.  Skin: No Porokeratosis. No infection or ulcers  Diagnosis:  Onychomycosis, , Pain in right toe, pain in left toes  Treatment & Plan Procedures and Treatment: Consent by patient was obtained for treatment procedures.   Debridement of mycotic and hypertrophic toenails, 1 through 5 bilateral and clearing of subungual debris. No ulceration, no infection noted. Padding dispensed fourth toe  Left foot. Return Visit-Office Procedure: Patient instructed to return to the office for a follow up visit 9 weeks  for continued evaluation and treatment    Gardiner Barefoot DPM

## 2018-12-18 NOTE — Progress Notes (Signed)
Patient: Terri Perez Female    DOB: April 17, 1945   73 y.o.   MRN: [id number] Visit Date: 12/24/2018  Today's Provider: [name], MD   Chief Complaint  Patient presents with  . Follow-up   Subjective:     HPI  Overall the patient has been doing well.  Her husband cares for her.  She does walk some with a walker around the house.  Otherwise she is in a wheelchair.  I do have questions about several skin places on her trunk.  Her anxiety,depression, RLS and Parkinson's disease are stable Parkinson disease ([place]) From 05/24/2018-Pt in wheelchair.  Chronic kidney disease (CKD), stage III (moderate) (HCC) From 05/24/2018-no changes. Labs checked 09/28/2018 stable.   Primary osteoarthritis of both hips From 05/24/2018-no changes.  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  Obesity without serious comorbidity, unspecified classification, unspecified obesity type From 05/24/2018-no changes.  Acute/Chronic anxiety From 05/24/2018-no changes.  Allergies  Allergen Reactions  . Alendronate Nausea Only    Gi symptoms  . Gluten Meal Nausea Only    Other reaction(s): NAUSEA Other reaction(s): NAUSEA Other reaction(s): NAUSEA  . Lac Bovis Nausea And Vomiting  . Milk-Related Compounds   . Nsaids     GI upset. Other reaction(s): Other (See Comments) Pt unsure  . Other Other (See Comments)    Other reaction(s): OTHER  . Oxycodone-Acetaminophen   . Oxycodone-Acetaminophen Other (See Comments)    Other reaction(s): NAUSEA  . Vicodin [Hydrocodone-Acetaminophen] Nausea Only, Diarrhea and Nausea And Vomiting  . Wheat Bran Other (See Comments)  . Sulfa Antibiotics Rash    Rash from steri strips  . Tape Rash    Rash from steri strips     Current Outpatient Medications:  .  ALPRAZolam (XANAX) 0.5 MG tablet, TAKE 1  TABLET BY MOUTH EVERY 4 HOURS AS NEEDED. MAX PER INSURANCE, Disp: 120 tablet, Rfl: 4 .  carbidopa-levodopa (SINEMET IR) 25-100 MG tablet, Take 250 tablets by mouth 3 (three) times daily. , Disp: , Rfl: 5 .  gabapentin (NEURONTIN) 300 MG capsule, Take 300 mg by mouth 3 (three) times daily., Disp: , Rfl:  .  mirtazapine (REMERON) 30 MG tablet, Take by mouth., Disp: , Rfl:  .  ondansetron (ZOFRAN-ODT) 4 MG disintegrating tablet, Take by mouth., Disp: , Rfl:  .  QUEtiapine (SEROQUEL) 25 MG tablet, Take 25 mg by mouth at bedtime., Disp: , Rfl:  .  rOPINIRole (REQUIP XL) 2 MG 24 hr tablet, Take 2 mg by mouth at bedtime., Disp: , Rfl:  .  traMADol (ULTRAM) 50 MG tablet, TAKE 1 TABLET (50 MG TOTAL) BY MOUTH EVERY 6 (SIX) HOURS AS NEEDED., Disp: 100 tablet, Rfl: 2 .  aspirin-acetaminophen-caffeine (EXCEDRIN MIGRAINE) [id number] MG tablet, Take by mouth every 8 (eight) hours as needed. Reported on 06/19/2015, Disp: , Rfl:   Review of Systems  Constitutional: Negative for appetite change, chills, fatigue and fever.  HENT: Negative.   Eyes: Negative.   Respiratory: Negative for chest tightness and shortness of breath.   Cardiovascular: Negative for chest pain and palpitations.  Gastrointestinal: Negative for abdominal pain, nausea and vomiting.  Endocrine: Negative.   Skin:       "Spots" on back  Allergic/Immunologic: Negative.   Neurological: Negative for dizziness and weakness.  Psychiatric/Behavioral: Negative.     Social History   Tobacco Use  . Smoking status: Never Smoker  .  Smokeless tobacco: Never Used  Substance Use Topics  . Alcohol use: No      Objective:   BP 139/88 (BP Location: Right Arm, Patient Position: Sitting, Cuff Size: Large)   Pulse 87   Temp (!) 96.9 F (36.1 C) (Other (Comment))   Resp 18   Ht 5\' 7"  (1.702 m)   Wt 226 lb (102.5 kg)   SpO2 94%   BMI 35.40 kg/m  Vitals:   12/24/18 1332  BP: 139/88  Pulse: 87  Resp: 18  Temp: (!) 96.9 F (36.1 C)  TempSrc:  Other (Comment)  SpO2: 94%  Weight: 226 lb (102.5 kg)  Height: 5\' 7"  (1.702 m)  Body mass index is 35.4 kg/m.   Physical Exam Vitals signs reviewed.  Constitutional:      Appearance: She is well-developed. She is obese.  HENT:     Head: Normocephalic and atraumatic.  Eyes:     General: No scleral icterus.    Conjunctiva/sclera: Conjunctivae normal.  Neck:     Musculoskeletal: Normal range of motion and neck supple.  Cardiovascular:     Rate and Rhythm: Normal rate and regular rhythm.     Heart sounds: Normal heart sounds.  Pulmonary:     Effort: Pulmonary effort is normal.     Breath sounds: Normal breath sounds.  Abdominal:     General: Bowel sounds are normal.     Palpations: Abdomen is soft.  Musculoskeletal:     Comments: +1 edema bilaterally   Skin:    General: Skin is warm and dry.     Comments: A couple of seborrheic keratoses on her back and trunk and possible actinic keratosis on shoulder area.  Neurological:     Mental Status: She is alert and oriented to person, place, and time.  Psychiatric:        Mood and Affect: Mood normal.        Behavior: Behavior normal.        Thought Content: Thought content normal.        Judgment: Judgment normal.      No results found for any visits on 12/24/18.     Assessment & Plan    1. Parkinson disease [facility]) Per neurology.  I have advised patient to remain as active as possible around the house.  I think her fall risk will increase of her strength is lessened. More than 50% 25 minute visit spent in counseling or coordination of care  - CBC w/Diff/Platelet - TSH - Comprehensive Metabolic Panel (CMET)  2. Other hyperlipidemia Check lipids in 2021 - CBC w/Diff/Platelet - TSH - Comprehensive Metabolic Panel (CMET)  3. Chronic anxiety Under very good control compared to years ago.  Consider stopping Seroquel in the future. - CBC w/Diff/Platelet - TSH - Comprehensive Metabolic Panel (CMET)  4. RLS (restless  legs syndrome) Consider stopping ropinirole in the future as this I think is contributing to her being sleepy. - CBC w/Diff/Platelet - TSH - Comprehensive Metabolic Panel (CMET)  5. SK (seborrheic keratosis/AKs Refer to dermatology for her initial visit. - Ambulatory referral to Dermatology   Follow up in 6 months.     Richard Cranford Mon, MD  [facility]

## 2018-12-24 ENCOUNTER — Other Ambulatory Visit: Payer: Self-pay

## 2018-12-24 ENCOUNTER — Encounter: Payer: Self-pay | Admitting: Family Medicine

## 2018-12-24 ENCOUNTER — Ambulatory Visit (INDEPENDENT_AMBULATORY_CARE_PROVIDER_SITE_OTHER): Payer: Medicare Other | Admitting: Family Medicine

## 2018-12-24 VITALS — BP 139/88 | HR 87 | Temp 96.9°F | Resp 18 | Ht 67.0 in | Wt 226.0 lb

## 2018-12-24 DIAGNOSIS — G20 Parkinson's disease: Secondary | ICD-10-CM | POA: Diagnosis not present

## 2018-12-24 DIAGNOSIS — E7849 Other hyperlipidemia: Secondary | ICD-10-CM | POA: Diagnosis not present

## 2018-12-24 DIAGNOSIS — G2581 Restless legs syndrome: Secondary | ICD-10-CM | POA: Diagnosis not present

## 2018-12-24 DIAGNOSIS — F419 Anxiety disorder, unspecified: Secondary | ICD-10-CM

## 2018-12-24 DIAGNOSIS — L821 Other seborrheic keratosis: Secondary | ICD-10-CM

## 2018-12-24 DIAGNOSIS — G20A1 Parkinson's disease without dyskinesia, without mention of fluctuations: Secondary | ICD-10-CM

## 2018-12-25 LAB — CBC WITH DIFFERENTIAL/PLATELET
Basophils Absolute: 0 10*3/uL (ref 0.0–0.2)
Basos: 0 %
EOS (ABSOLUTE): 0.2 10*3/uL (ref 0.0–0.4)
Eos: 3 %
Hematocrit: 40.3 % (ref 34.0–46.6)
Hemoglobin: 13 g/dL (ref 11.1–15.9)
Immature Grans (Abs): 0 10*3/uL (ref 0.0–0.1)
Immature Granulocytes: 0 %
Lymphocytes Absolute: 2.6 10*3/uL (ref 0.7–3.1)
Lymphs: 35 %
MCH: 30.8 pg (ref 26.6–33.0)
MCHC: 32.3 g/dL (ref 31.5–35.7)
MCV: 96 fL (ref 79–97)
Monocytes Absolute: 0.6 10*3/uL (ref 0.1–0.9)
Monocytes: 8 %
Neutrophils Absolute: 4.1 10*3/uL (ref 1.4–7.0)
Neutrophils: 54 %
Platelets: 242 10*3/uL (ref 150–450)
RBC: 4.22 x10E6/uL (ref 3.77–5.28)
RDW: 12.8 % (ref 11.7–15.4)
WBC: 7.5 10*3/uL (ref 3.4–10.8)

## 2018-12-25 LAB — COMPREHENSIVE METABOLIC PANEL
ALT: 7 IU/L (ref 0–32)
AST: 13 IU/L (ref 0–40)
Albumin/Globulin Ratio: 1.4 (ref 1.2–2.2)
Albumin: 4.2 g/dL (ref 3.7–4.7)
Alkaline Phosphatase: 137 IU/L — ABNORMAL HIGH (ref 39–117)
BUN/Creatinine Ratio: 19 (ref 12–28)
BUN: 18 mg/dL (ref 8–27)
Bilirubin Total: 0.3 mg/dL (ref 0.0–1.2)
CO2: 25 mmol/L (ref 20–29)
Calcium: 10.1 mg/dL (ref 8.7–10.3)
Chloride: 102 mmol/L (ref 96–106)
Creatinine, Ser: 0.93 mg/dL (ref 0.57–1.00)
GFR calc Af Amer: 71 mL/min/{1.73_m2} (ref >59)
GFR calc non Af Amer: 61 mL/min/{1.73_m2} (ref >59)
Globulin, Total: 2.9 g/dL (ref 1.5–4.5)
Glucose: 85 mg/dL (ref 65–99)
Potassium: 4.8 mmol/L (ref 3.5–5.2)
Sodium: 140 mmol/L (ref 134–144)
Total Protein: 7.1 g/dL (ref 6.0–8.5)

## 2018-12-25 LAB — TSH: TSH: 1.23 u[IU]/mL (ref 0.450–4.500)

## 2019-01-08 ENCOUNTER — Other Ambulatory Visit: Payer: Self-pay | Admitting: Family Medicine

## 2019-01-08 NOTE — Telephone Encounter (Signed)
traMADol (ULTRAM) 50 MG tablet     Patient is requesting refill.    Pharmacy:  [facility] Phone:  [phone]  Fax:  [phone]

## 2019-01-08 NOTE — Telephone Encounter (Signed)
From [facility] 

## 2019-01-09 MED ORDER — TRAMADOL HCL 50 MG PO TABS: 50.0000 mg | ORAL_TABLET | Freq: Four times a day (QID) | ORAL | 2 refills | Status: DC | PRN

## 2019-02-04 ENCOUNTER — Other Ambulatory Visit: Payer: Self-pay

## 2019-02-04 ENCOUNTER — Encounter: Payer: Self-pay | Admitting: Podiatry

## 2019-02-04 ENCOUNTER — Ambulatory Visit (INDEPENDENT_AMBULATORY_CARE_PROVIDER_SITE_OTHER): Payer: Medicare Other | Admitting: Podiatry

## 2019-02-04 DIAGNOSIS — M79676 Pain in unspecified toe(s): Secondary | ICD-10-CM

## 2019-02-04 DIAGNOSIS — B351 Tinea unguium: Secondary | ICD-10-CM | POA: Diagnosis not present

## 2019-02-04 NOTE — Progress Notes (Signed)
Complaint:  Visit Type: Patient returns to my office for continued preventative foot care services. Complaint: Patient states" my nails have grown long and thick and become painful to walk and wear shoes" Patient has been diagnosed with DM with no foot complications. The patient presents for preventative foot care services. No changes to ROS.  Patient has Parkinsons.  Podiatric Exam: Vascular: dorsalis pedis and posterior tibial pulses are palpable bilateral. Capillary return is immediate. Temperature gradient is WNL. Skin turgor WNL  Sensorium: Normal Semmes Weinstein monofilament test. Normal tactile sensation bilaterally. Nail Exam: Pt has thick disfigured discolored nails with subungual debris noted bilateral entire nail hallux through fifth toenails Ulcer Exam: There is no evidence of ulcer or pre-ulcerative changes or infection. Orthopedic Exam: Muscle tone and strength are WNL. No limitations in general ROM. No crepitus or effusions noted. Foot type and digits show no abnormalities. HAV  B/L.  Hammer toes 2-5  B/L. Skin: No Porokeratosis. No infection or ulcers  Diagnosis:  Onychomycosis, , Pain in right toe, pain in left toes  Treatment & Plan Procedures and Treatment: Consent by patient was obtained for treatment procedures.   Debridement of mycotic and hypertrophic toenails, 1 through 5 bilateral and clearing of subungual debris. No ulceration, no infection noted.  Return Visit-Office Procedure: Patient instructed to return to the office for a follow up visit 9 weeks  for continued evaluation and treatment.    Gardiner Barefoot DPM

## 2019-02-11 ENCOUNTER — Ambulatory Visit: Payer: Medicare Other | Admitting: Podiatry

## 2019-04-15 ENCOUNTER — Other Ambulatory Visit: Payer: Self-pay

## 2019-04-15 ENCOUNTER — Ambulatory Visit (INDEPENDENT_AMBULATORY_CARE_PROVIDER_SITE_OTHER): Payer: Medicare Other | Admitting: Podiatry

## 2019-04-15 ENCOUNTER — Encounter: Payer: Self-pay | Admitting: Podiatry

## 2019-04-15 VITALS — Temp 98.1°F

## 2019-04-15 DIAGNOSIS — B351 Tinea unguium: Secondary | ICD-10-CM | POA: Diagnosis not present

## 2019-04-15 DIAGNOSIS — M79676 Pain in unspecified toe(s): Secondary | ICD-10-CM

## 2019-04-15 DIAGNOSIS — Q6689 Other  specified congenital deformities of feet: Secondary | ICD-10-CM

## 2019-04-15 NOTE — Progress Notes (Addendum)
This patient returns to my office for at risk foot care.  This patient requires this care by a professional since this patient will be at risk due to having chronic kidney disease and Parkinsons.  Patient is accompanied by her husband. This patient is unable to cut nails herself since the patient cannot reach her nails.These nails are painful walking and wearing shoes.  This patient presents for at risk foot care today.  General Appearance  Alert, conversant and in no acute stress.  Vascular  Dorsalis pedis and posterior tibial  pulses are palpable  bilaterally.  Capillary return is within normal limits  bilaterally. Temperature is within normal limits  bilaterally.  Neurologic  Senn-Weinstein monofilament wire test within normal limits  bilaterally. Muscle power within normal limits bilaterally.  Nails Thick disfigured discolored nails with subungual debris  from hallux to fifth toes bilaterally. No evidence of bacterial infection or drainage bilaterally.  Orthopedic  No limitations of motion  feet .  No crepitus or effusions noted.  No bony pathology or digital deformities noted.  HAV  B/L.  Hammer otes 2-4  B/L.    Skin  normotropic skin with no porokeratosis noted bilaterally.  No signs of infections or ulcers noted.     Onychomycosis  Pain in right toes  Pain in left toes  Consent was obtained for treatment procedures.   Mechanical debridement of nails 1-5  bilaterally performed with a nail nipper.  Filed with dremel without incident. No infection or ulcer.     Return office visit     9 weeks                 Told patient to return for periodic foot care and evaluation due to potential at risk complications. To have consult with Dr.  Posey Pronto for tenotomy correction of toe.   Gardiner Barefoot DPM

## 2019-04-20 ENCOUNTER — Other Ambulatory Visit: Payer: Self-pay | Admitting: Family Medicine

## 2019-04-20 DIAGNOSIS — F419 Anxiety disorder, unspecified: Secondary | ICD-10-CM

## 2019-04-20 NOTE — Telephone Encounter (Signed)
Requested medications are due for refill today?  Non-delegated  Requested medications are on active medication list?  Yes  Last Refill:   09/06/2018  # 120 tablets with 4 refills   Future visit scheduled?  Yes  Notes to Clinic: Refill could not be delegated.

## 2019-04-22 NOTE — Telephone Encounter (Signed)
Last ov 12/24/18, please advise?

## 2019-04-25 ENCOUNTER — Ambulatory Visit: Payer: Medicare Other | Admitting: Podiatry

## 2019-05-06 ENCOUNTER — Ambulatory Visit: Payer: Medicare Other | Admitting: Podiatry

## 2019-05-08 ENCOUNTER — Other Ambulatory Visit: Payer: Self-pay

## 2019-05-08 DIAGNOSIS — F419 Anxiety disorder, unspecified: Secondary | ICD-10-CM

## 2019-05-08 NOTE — Telephone Encounter (Signed)
Please advise 

## 2019-05-09 MED ORDER — ALPRAZOLAM 0.5 MG PO TABS: ORAL_TABLET | ORAL | 1 refills | Status: DC

## 2019-06-17 ENCOUNTER — Ambulatory Visit (INDEPENDENT_AMBULATORY_CARE_PROVIDER_SITE_OTHER): Payer: Medicare Other | Admitting: Podiatry

## 2019-06-17 ENCOUNTER — Ambulatory Visit (INDEPENDENT_AMBULATORY_CARE_PROVIDER_SITE_OTHER): Payer: Medicare Other

## 2019-06-17 ENCOUNTER — Other Ambulatory Visit: Payer: Self-pay

## 2019-06-17 ENCOUNTER — Encounter: Payer: Self-pay | Admitting: Podiatry

## 2019-06-17 DIAGNOSIS — Q6689 Other  specified congenital deformities of feet: Secondary | ICD-10-CM | POA: Diagnosis not present

## 2019-06-17 DIAGNOSIS — M2041 Other hammer toe(s) (acquired), right foot: Secondary | ICD-10-CM

## 2019-06-17 NOTE — Progress Notes (Signed)
She presents today with her husband with a history of fourth toe left foot turning purple.  She says it really only does it when it is down and then it swells and then I put it up and it looks perfectly normal.  She is concerned that it may be circulatory problems.  Objective: Vital signs are stable alert oriented x3.  Pulses are palpable.  There is no erythema edema cellulitis drainage or odor she does have some rubor on dependency of the toe.  But no pain.  She has severe hammertoe deformities with bunion deformity left.  Pes planus is also noted.  Arthritic forefoot.  Assessment: Hammertoe deformities venous congestion fourth toe left.  Plan: Placed her in a silicone Saltus device I will follow-up with her as needed.

## 2019-06-25 ENCOUNTER — Ambulatory Visit: Payer: Self-pay | Admitting: Family Medicine

## 2019-06-27 ENCOUNTER — Ambulatory Visit: Payer: Medicare Other | Admitting: Podiatry

## 2019-07-03 ENCOUNTER — Encounter: Payer: Self-pay | Admitting: Family Medicine

## 2019-07-03 ENCOUNTER — Other Ambulatory Visit: Payer: Self-pay

## 2019-07-03 ENCOUNTER — Ambulatory Visit (INDEPENDENT_AMBULATORY_CARE_PROVIDER_SITE_OTHER): Payer: Medicare Other | Admitting: Family Medicine

## 2019-07-03 VITALS — BP 131/84 | HR 92 | Temp 97.2°F | Resp 18 | Ht 67.0 in | Wt 224.0 lb

## 2019-07-03 DIAGNOSIS — E669 Obesity, unspecified: Secondary | ICD-10-CM

## 2019-07-03 DIAGNOSIS — G20 Parkinson's disease: Secondary | ICD-10-CM | POA: Diagnosis not present

## 2019-07-03 DIAGNOSIS — G903 Multi-system degeneration of the autonomic nervous system: Secondary | ICD-10-CM

## 2019-07-03 DIAGNOSIS — E7849 Other hyperlipidemia: Secondary | ICD-10-CM

## 2019-07-03 DIAGNOSIS — N183 Chronic kidney disease, stage 3 unspecified: Secondary | ICD-10-CM

## 2019-07-03 DIAGNOSIS — F411 Generalized anxiety disorder: Secondary | ICD-10-CM

## 2019-07-03 DIAGNOSIS — G2581 Restless legs syndrome: Secondary | ICD-10-CM

## 2019-07-03 DIAGNOSIS — M810 Age-related osteoporosis without current pathological fracture: Secondary | ICD-10-CM | POA: Diagnosis not present

## 2019-07-03 DIAGNOSIS — M7062 Trochanteric bursitis, left hip: Secondary | ICD-10-CM

## 2019-07-03 DIAGNOSIS — I951 Orthostatic hypotension: Secondary | ICD-10-CM

## 2019-07-03 DIAGNOSIS — M158 Other polyosteoarthritis: Secondary | ICD-10-CM

## 2019-07-03 DIAGNOSIS — M160 Bilateral primary osteoarthritis of hip: Secondary | ICD-10-CM

## 2019-07-03 DIAGNOSIS — F331 Major depressive disorder, recurrent, moderate: Secondary | ICD-10-CM

## 2019-07-03 DIAGNOSIS — C50912 Malignant neoplasm of unspecified site of left female breast: Secondary | ICD-10-CM

## 2019-07-03 MED ORDER — ALENDRONATE SODIUM 70 MG PO TABS: 70.0000 mg | ORAL_TABLET | ORAL | 12 refills | Status: DC

## 2019-07-03 MED ORDER — PREDNISONE 20 MG PO TABS: 20.0000 mg | ORAL_TABLET | Freq: Every day | ORAL | 0 refills | Status: DC

## 2019-07-03 MED ORDER — TRAMADOL HCL 50 MG PO TABS: 50.0000 mg | ORAL_TABLET | Freq: Three times a day (TID) | ORAL | 4 refills | Status: DC | PRN

## 2019-07-03 NOTE — Progress Notes (Signed)
Established patient visit  I,[name],acting as a scribe for [name], MD.,have documented all relevant documentation on the behalf of [name], MD,as directed by  [name], MD while in the presence of [name], MD.   Patient: Terri Perez   DOB: May 30, 1945   74 y.o. Female  MRN: [id number] Visit Date: 07/03/2019  Today's healthcare provider: [name], MD   Chief Complaint  Patient presents with  . Follow-up  . Hyperlipidemia  . Anxiety   Subjective    HPI  Overall patient is stable but is less and less mobile.  She and her husband state that is from the Parkinson's disease mainly but also due to arthritic and pelvic fracture pain. She is not any more depressed or anxious.  She states she is having more pain.  She is on 2 tramadol a day. Lipid/Cholesterol, follow-up  Last Lipid Panel: Lab Results  Component Value Date   CHOL 229 (H) 09/27/2017   LDLCALC 116 (H) 09/27/2017   HDL 93 09/27/2017   TRIG 99 09/27/2017    She was last seen for this 7 months ago.  Management since that visit includes; From 12/24/2019-Check lipids in 2021.  She reports good compliance with treatment. She is not having side effects. none She is following a Regular diet. Current exercise: none  Last metabolic panel Lab Results  Component Value Date   GLUCOSE 85 12/24/2018   NA 140 12/24/2018   K 4.8 12/24/2018   BUN 18 12/24/2018   CREATININE 0.93 12/24/2018   GFRNONAA 61 12/24/2018   GFRAA 71 12/24/2018   CALCIUM 10.1 12/24/2018   AST 13 12/24/2018   ALT 7 12/24/2018   The 10-year ASCVD risk score Mikey Bussing DC Jr., et al., 2013) is: 15.2%  --------------------------------------------------------------------  Parkinson disease ([place]) From 12/24/2019-Per neurology.  I have advised patient to remain as active as possible around the house.  I think her fall risk will increase of her strength is lessened. More than 50% 25 minute  visit spent in counseling or coordination of care  Chronic anxiety From 12/24/2019-Under very good control compared to years ago.  Consider stopping Seroquel in the future.  RLS (restless legs syndrome) From 12/24/2019-Consider stopping ropinirole in the future as this I think is contributing to her being sleepy.      Medications: Outpatient Medications Prior to Visit  Medication Sig  . ALPRAZolam (XANAX) 0.5 MG tablet TAKE ONE TABLET BY MOUTH EVERY 4 HOURS AS NEEDED.  30 day supply.  Marland Kitchen aspirin-acetaminophen-caffeine (EXCEDRIN MIGRAINE) 250-250-65 MG tablet Take by mouth every 8 (eight) hours as needed. Reported on 06/19/2015  . carbidopa-levodopa (SINEMET IR) 25-100 MG tablet Take 250 tablets by mouth 3 (three) times daily.   Marland Kitchen gabapentin (NEURONTIN) 300 MG capsule Take 300 mg by mouth 3 (three) times daily.  . mirtazapine (REMERON) 30 MG tablet Take by mouth.  . ondansetron (ZOFRAN-ODT) 4 MG disintegrating tablet Take by mouth.  . QUEtiapine (SEROQUEL) 25 MG tablet Take 25 mg by mouth at bedtime.  Marland Kitchen rOPINIRole (REQUIP XL) 2 MG 24 hr tablet Take 2 mg by mouth at bedtime.  . traMADol (ULTRAM) 50 MG tablet Take 1 tablet (50 mg total) by mouth every 6 (six) hours as needed.   No facility-administered medications prior to visit.    Review of Systems  Constitutional: Negative for appetite change, chills, fatigue and fever.  HENT: Negative.   Eyes: Negative.   Respiratory: Negative for chest  tightness and shortness of breath.   Cardiovascular: Negative for chest pain and palpitations.  Gastrointestinal: Negative for abdominal pain, nausea and vomiting.  Endocrine: Negative.   Musculoskeletal: Positive for arthralgias and gait problem.  Allergic/Immunologic: Negative.   Neurological: Negative for dizziness and weakness.       Gait problem due to Parkinson's also.  Hematological: Negative.   Psychiatric/Behavioral: Negative.        Objective    BP 131/84 (BP Location: Right  Arm, Patient Position: Sitting, Cuff Size: Large)   Pulse 92   Temp (!) 97.2 F (36.2 C) (Other (Comment))   Resp 18   Ht 5\' 7"  (1.702 m)   Wt 224 lb (101.6 kg)   SpO2 94%   BMI 35.08 kg/m  Wt Readings from Last 3 Encounters:  07/03/19 224 lb (101.6 kg)  12/24/18 226 lb (102.5 kg)  05/24/18 226 lb (102.5 kg)      Physical Exam Vitals reviewed.  Constitutional:      Appearance: She is well-developed. She is obese.  HENT:     Head: Normocephalic and atraumatic.  Eyes:     General: No scleral icterus.    Conjunctiva/sclera: Conjunctivae normal.  Cardiovascular:     Rate and Rhythm: Normal rate and regular rhythm.     Heart sounds: Normal heart sounds.  Pulmonary:     Effort: Pulmonary effort is normal.     Breath sounds: Normal breath sounds.  Abdominal:     General: Bowel sounds are normal.     Palpations: Abdomen is soft.  Musculoskeletal:     Cervical back: Normal range of motion and neck supple.     Comments: +1 edema bilaterally  She has tender over her right trochanteric bursa area  Skin:    General: Skin is warm and dry.     Comments: A couple of seborrheic keratoses on her back and trunk and possible actinic keratosis on shoulder area.  Neurological:     Mental Status: She is alert and oriented to person, place, and time.     Comments: Masked facies consistent with parkinsonism  Psychiatric:        Mood and Affect: Mood normal.        Behavior: Behavior normal.        Thought Content: Thought content normal.        Judgment: Judgment normal.       No results found for any visits on 07/03/19.  Assessment & Plan    1. Parkinson disease ([place]) Followed by neurology.  2. Age-related osteoporosis without current pathological fracture Patient had pelvic osteoporotic fractures in the past.  She declines BMD but we need to treat this is osteoporosis.  She is at low fall risk but because she is so feeble she could fall from being dropped.  We will start  alendronate 70 mg weekly.  She may or may not tolerate this and if she does not we will refer to endocrinology for consideration of injectables.  Patient is not excited about this.  3. Other hyperlipidemia  - Lipid panel - CBC - Comprehensive Metabolic Panel (CMET) - TSH  4. Generalized anxiety disorder Chronic problem.  5. RLS (restless legs syndrome) Improved on medication. - Lipid panel - CBC - Comprehensive Metabolic Panel (CMET) - TSH  6. Stage 3 chronic kidney disease, unspecified whether stage 3a or 3b CKD  - Lipid panel - CBC - Comprehensive Metabolic Panel (CMET) - TSH  7. Trochanteric bursitis of left hip Try a  short steroid burst. - predniSONE (DELTASONE) 20 MG tablet; Take 1 tablet (20 mg total) by mouth daily with breakfast.  Dispense: 5 tablet; Refill: 0  8. Primary osteoarthritis of both hips Increase tramadol from 50 mg twice daily to 3 times daily as needed. - traMADol (ULTRAM) 50 MG tablet; Take 1 tablet (50 mg total) by mouth 3 (three) times daily as needed.  Dispense: 90 tablet; Refill: 4 - alendronate (FOSAMAX) 70 MG tablet; Take 1 tablet (70 mg total) by mouth once a week. Take with a full glass of water on an empty stomach.  Dispense: 4 tablet; Refill: 12  9. Orthostatic hypotension dysautonomic syndrome (HCC)   10. Other osteoarthritis involving multiple joints   11. Malignant neoplasm of left female breast, unspecified estrogen receptor status, unspecified site of breast ([place])   12. Obesity without serious comorbidity, unspecified classification, unspecified obesity type   13. Moderate episode of recurrent major depressive disorder (HCC) Continue mirtazapine and Seroquel.    No follow-ups on file.      I, [name], MD, have reviewed all documentation for this visit. The documentation on 07/08/19 for the exam, diagnosis, procedures, and orders are all accurate and complete.    Juwann Sherk Cranford Mon, MD  [facility] [phone] (phone) [phone] (fax)  [place]

## 2019-07-04 LAB — COMPREHENSIVE METABOLIC PANEL
ALT: 7 IU/L (ref 0–32)
AST: 12 IU/L (ref 0–40)
Albumin/Globulin Ratio: 1.5 (ref 1.2–2.2)
Albumin: 4 g/dL (ref 3.7–4.7)
Alkaline Phosphatase: 89 IU/L (ref 48–121)
BUN/Creatinine Ratio: 23 (ref 12–28)
BUN: 21 mg/dL (ref 8–27)
Bilirubin Total: 0.3 mg/dL (ref 0.0–1.2)
CO2: 24 mmol/L (ref 20–29)
Calcium: 9.8 mg/dL (ref 8.7–10.3)
Chloride: 101 mmol/L (ref 96–106)
Creatinine, Ser: 0.9 mg/dL (ref 0.57–1.00)
GFR calc Af Amer: 73 mL/min/{1.73_m2} (ref >59)
GFR calc non Af Amer: 63 mL/min/{1.73_m2} (ref >59)
Globulin, Total: 2.7 g/dL (ref 1.5–4.5)
Glucose: 112 mg/dL — ABNORMAL HIGH (ref 65–99)
Potassium: 4.4 mmol/L (ref 3.5–5.2)
Sodium: 140 mmol/L (ref 134–144)
Total Protein: 6.7 g/dL (ref 6.0–8.5)

## 2019-07-04 LAB — LIPID PANEL
Chol/HDL Ratio: 2.5 ratio (ref 0.0–4.4)
Cholesterol, Total: 218 mg/dL — ABNORMAL HIGH (ref 100–199)
HDL: 86 mg/dL (ref >39)
LDL Chol Calc (NIH): 119 mg/dL — ABNORMAL HIGH (ref 0–99)
Triglycerides: 73 mg/dL (ref 0–149)
VLDL Cholesterol Cal: 13 mg/dL (ref 5–40)

## 2019-07-04 LAB — CBC
Hematocrit: 38.9 % (ref 34.0–46.6)
Hemoglobin: 12.8 g/dL (ref 11.1–15.9)
MCH: 31 pg (ref 26.6–33.0)
MCHC: 32.9 g/dL (ref 31.5–35.7)
MCV: 94 fL (ref 79–97)
Platelets: 214 10*3/uL (ref 150–450)
RBC: 4.13 x10E6/uL (ref 3.77–5.28)
RDW: 12.9 % (ref 11.7–15.4)
WBC: 8.5 10*3/uL (ref 3.4–10.8)

## 2019-07-04 LAB — TSH: TSH: 0.771 u[IU]/mL (ref 0.450–4.500)

## 2019-07-08 ENCOUNTER — Telehealth: Payer: Self-pay

## 2019-07-08 NOTE — Telephone Encounter (Signed)
Patient advised of lab results

## 2019-07-08 NOTE — Telephone Encounter (Signed)
-----   Message from Jerrol Banana., MD sent at 07/08/2019  3:36 PM EDT ----- Labs stable.

## 2019-09-03 ENCOUNTER — Other Ambulatory Visit: Payer: Self-pay | Admitting: Family Medicine

## 2019-09-03 DIAGNOSIS — Z1231 Encounter for screening mammogram for malignant neoplasm of breast: Secondary | ICD-10-CM

## 2019-10-09 ENCOUNTER — Ambulatory Visit
Admission: RE | Admit: 2019-10-09 | Discharge: 2019-10-09 | Disposition: A | Discharge status: Home / Self Care | Payer: Medicare Other | Source: Ambulatory Visit | Attending: Family Medicine | Admitting: Family Medicine

## 2019-10-09 ENCOUNTER — Other Ambulatory Visit: Payer: Self-pay

## 2019-10-09 DIAGNOSIS — Z1231 Encounter for screening mammogram for malignant neoplasm of breast: Secondary | ICD-10-CM | POA: Diagnosis present

## 2019-10-16 ENCOUNTER — Other Ambulatory Visit: Payer: Self-pay | Admitting: Family Medicine

## 2019-10-16 DIAGNOSIS — F419 Anxiety disorder, unspecified: Secondary | ICD-10-CM

## 2019-10-16 NOTE — Telephone Encounter (Signed)
Requested medication (s) are due for refill today: Yes  Requested medication (s) are on the active medication list: Yes  Last refill:  05/09/19  Future visit scheduled: Yes  Notes to clinic:  See request.    Requested Prescriptions  Pending Prescriptions Disp Refills   ALPRAZolam (XANAX) 0.5 MG tablet [Pharmacy Med Name: ALPRAZolam 0.5 MG TABLET] 120 tablet     Sig: TAKE ONE TABLET BY MOUTH EVERY 4 HOURS AS NEEDED      Not Delegated - Psychiatry:  Anxiolytics/Hypnotics Failed - 10/16/2019 12:52 PM      Failed - This refill cannot be delegated      Failed - Urine Drug Screen completed in last 360 days.      Passed - Valid encounter within last 6 months    Recent Outpatient Visits           3 months ago Parkinson disease [facility])   [facility] Jerrol Banana., MD   9 months ago Parkinson disease [facility])   [facility] Jerrol Banana., MD   1 year ago Bilateral impacted cerumen   [facility] Jerrol Banana., MD   2 years ago Other hyperlipidemia   [facility] Jerrol Banana., MD   2 years ago Parkinson disease [facility])   [facility] Jerrol Banana., MD       Future Appointments             In 2 weeks Jerrol Banana., MD [facility] [place]

## 2019-11-01 NOTE — Progress Notes (Signed)
I,[name],acting as a scribe for [name], MD.,have documented all relevant documentation on the behalf of [name], MD,as directed by  [name], MD while in the presence of [name], MD.   Established patient visit   Patient: Terri Perez   DOB: 01-May-1945   74 y.o. Female  MRN: [id number] Visit Date: 11/04/2019  Today's healthcare provider: [name], MD   Chief Complaint  Patient presents with  . Anxiety  . Depression  . Follow-up   Subjective    HPI  Pt in slow but steady decline. She is inactive but gets around her house some.She has chronic occasional nausea.Probably due to Sinemet which she needs for parkinsons.  Anxiety, Follow-up  She was last seen for anxiety 4 months ago. Changes made at last visit include; Chronic problem.   She reports good compliance with treatment. She reports good tolerance of treatment. She is not having side effects. none  She feels her anxiety is moderate and Unchanged since last visit.  GAD-7 Results GAD-7 Generalized Anxiety Disorder Screening Tool 11/04/2019  1. Feeling Nervous, Anxious, or on Edge 3  2. Not Being Able to Stop or Control Worrying 3  3. Worrying Too Much About Different Things 1  4. Trouble Relaxing 1  5. Being So Restless it's Hard To Sit Still 2  6. Becoming Easily Annoyed or Irritable 3  7. Feeling Afraid As If Something Awful Might Happen 3  Total GAD-7 Score 16  Difficulty At Work, Home, or Getting  Along With Others? Extremely difficult    PHQ-9 Scores PHQ9 SCORE ONLY 11/04/2019 09/27/2017 09/20/2016  PHQ-9 Total Score 15 0 2    -------------------------------------------------------------------- Depression, Follow-up  She  was last seen for this 4 months ago. Changes made at last visit include; Continue mirtazapine and Seroquel.   She reports good compliance with treatment. She is not having side effects. none  She reports good  tolerance of treatment. Current symptoms include: n/a She feels she is Unchanged since last visit.  Depression screen [facility] 2/9 11/04/2019 09/27/2017 09/20/2016  Decreased Interest 3 0 -  Down, Depressed, Hopeless 2 0 -  PHQ - 2 Score 5 0 -  Altered sleeping 2 - 0  Tired, decreased energy 3 - 0  Change in appetite 0 - 0  Feeling bad or failure about yourself  2 - 2  Trouble concentrating 0 - 0  Moving slowly or fidgety/restless 3 - 0  Suicidal thoughts 0 - 0  PHQ-9 Score 15 - -  Difficult doing work/chores Extremely dIfficult - -    --------------------------------------------------------------------  Trochanteric bursitis of left hip From 07/03/2019-Try a short steroid burst.  Primary osteoarthritis of both hips From 07/03/2019-Increased tramadol from 50 mg twice daily to 3 times daily as needed.      Medications: Outpatient Medications Prior to Visit  Medication Sig  . ALPRAZolam (XANAX) 0.5 MG tablet TAKE ONE TABLET BY MOUTH EVERY 4 HOURS AS NEEDED  . aspirin-acetaminophen-caffeine (EXCEDRIN MIGRAINE) 250-250-65 MG tablet Take by mouth every 8 (eight) hours as needed. Reported on 06/19/2015  . carbidopa-levodopa (SINEMET IR) 25-100 MG tablet Take 250 tablets by mouth 3 (three) times daily.   Marland Kitchen gabapentin (NEURONTIN) 300 MG capsule Take 300 mg by mouth 3 (three) times daily.  . QUEtiapine (SEROQUEL) 25 MG tablet Take 25 mg by mouth at bedtime.  Marland Kitchen rOPINIRole (REQUIP XL) 2 MG 24 hr tablet Take 2 mg by mouth at bedtime.  Marland Kitchen  traMADol (ULTRAM) 50 MG tablet Take 1 tablet (50 mg total) by mouth 3 (three) times daily as needed.  Marland Kitchen alendronate (FOSAMAX) 70 MG tablet Take 1 tablet (70 mg total) by mouth once a week. Take with a full glass of water on an empty stomach. (Patient not taking: Reported on 11/04/2019)  . mirtazapine (REMERON) 30 MG tablet Take by mouth. (Patient not taking: Reported on 11/04/2019)  . ondansetron (ZOFRAN-ODT) 4 MG disintegrating tablet Take by mouth. (Patient not  taking: Reported on 11/04/2019)  . predniSONE (DELTASONE) 20 MG tablet Take 1 tablet (20 mg total) by mouth daily with breakfast.   No facility-administered medications prior to visit.    Review of Systems  Constitutional: Negative for appetite change, chills, fatigue and fever.  Respiratory: Negative for chest tightness and shortness of breath.   Cardiovascular: Negative for chest pain and palpitations.  Gastrointestinal: Negative for abdominal pain, nausea and vomiting.  Neurological: Negative for dizziness and weakness.       Objective    BP 140/87 (BP Location: Right Arm, Patient Position: Sitting, Cuff Size: Large)   Pulse 95   Temp 97.9 F (36.6 C) (Oral)   Resp 18   Ht 5\' 7"  (1.702 m)   Wt 225 lb (102.1 kg)   SpO2 98%   BMI 35.24 kg/m  BP Readings from Last 3 Encounters:  11/04/19 140/87  07/03/19 131/84  12/24/18 139/88   Wt Readings from Last 3 Encounters:  11/04/19 225 lb (102.1 kg)  07/03/19 224 lb (101.6 kg)  12/24/18 226 lb (102.5 kg)      Physical Exam Vitals reviewed.  Constitutional:      Appearance: She is well-developed. She is obese.  HENT:     Head: Normocephalic and atraumatic.  Eyes:     General: No scleral icterus.    Conjunctiva/sclera: Conjunctivae normal.  Cardiovascular:     Rate and Rhythm: Normal rate and regular rhythm.     Heart sounds: Normal heart sounds.  Pulmonary:     Effort: Pulmonary effort is normal.     Breath sounds: Normal breath sounds.  Abdominal:     General: Bowel sounds are normal.     Palpations: Abdomen is soft.  Musculoskeletal:     Cervical back: Normal range of motion and neck supple.     Comments: +1 edema bilaterally   Skin:    General: Skin is warm and dry.  Neurological:     Mental Status: She is alert and oriented to person, place, and time.     Comments: Masked facies consistent with parkinsonism  Psychiatric:        Mood and Affect: Mood normal.        Behavior: Behavior normal.         Thought Content: Thought content normal.        Judgment: Judgment normal.       No results found for any visits on 11/04/19.  Assessment & Plan     1. Parkinson disease [facility]) Per Neurology  2. Other osteoporosis with current pathological fracture with delayed healing, subsequent encounter BMD   3. Moderate episode of recurrent major depressive disorder (HCC) Stable  4. Malignant neoplasm of left female breast, unspecified estrogen receptor status, unspecified site of breast ([place])   5. Obesity without serious comorbidity, unspecified classification, unspecified obesity type   6. Nausea Due to sinemet. Rx Zofran.   No follow-ups on file.      I, [name], MD, have reviewed all  documentation for this visit. The documentation on 11/09/19 for the exam, diagnosis, procedures, and orders are all accurate and complete.    Nuriyah Hanline Cranford Mon, MD  [facility] [phone] (phone) [phone] (fax)  [place]

## 2019-11-04 ENCOUNTER — Ambulatory Visit (INDEPENDENT_AMBULATORY_CARE_PROVIDER_SITE_OTHER): Payer: Medicare Other | Admitting: Family Medicine

## 2019-11-04 ENCOUNTER — Encounter: Payer: Self-pay | Admitting: Family Medicine

## 2019-11-04 ENCOUNTER — Other Ambulatory Visit: Payer: Self-pay

## 2019-11-04 VITALS — BP 140/87 | HR 95 | Temp 97.9°F | Resp 18 | Ht 67.0 in | Wt 225.0 lb

## 2019-11-04 DIAGNOSIS — G20 Parkinson's disease: Secondary | ICD-10-CM | POA: Diagnosis not present

## 2019-11-04 DIAGNOSIS — F331 Major depressive disorder, recurrent, moderate: Secondary | ICD-10-CM | POA: Diagnosis not present

## 2019-11-04 DIAGNOSIS — C50912 Malignant neoplasm of unspecified site of left female breast: Secondary | ICD-10-CM

## 2019-11-04 DIAGNOSIS — R110 Nausea: Secondary | ICD-10-CM

## 2019-11-04 DIAGNOSIS — E669 Obesity, unspecified: Secondary | ICD-10-CM

## 2019-11-04 DIAGNOSIS — M8080XG Other osteoporosis with current pathological fracture, unspecified site, subsequent encounter for fracture with delayed healing: Secondary | ICD-10-CM | POA: Diagnosis not present

## 2019-11-04 MED ORDER — ONDANSETRON 4 MG PO TBDP: 4.0000 mg | ORAL_TABLET | Freq: Three times a day (TID) | ORAL | 2 refills | Status: DC | PRN

## 2019-11-04 MED ORDER — ONDANSETRON 4 MG PO TBDP: 4.0000 mg | ORAL_TABLET | Freq: Three times a day (TID) | ORAL | 0 refills | Status: DC | PRN

## 2019-11-08 ENCOUNTER — Ambulatory Visit (INDEPENDENT_AMBULATORY_CARE_PROVIDER_SITE_OTHER): Payer: Medicare Other

## 2019-11-08 ENCOUNTER — Other Ambulatory Visit: Payer: Self-pay

## 2019-11-08 DIAGNOSIS — Z23 Encounter for immunization: Secondary | ICD-10-CM

## 2019-12-05 ENCOUNTER — Other Ambulatory Visit: Payer: Self-pay

## 2019-12-05 ENCOUNTER — Ambulatory Visit (INDEPENDENT_AMBULATORY_CARE_PROVIDER_SITE_OTHER): Payer: Medicare Other

## 2019-12-05 DIAGNOSIS — Z23 Encounter for immunization: Secondary | ICD-10-CM

## 2020-01-22 ENCOUNTER — Other Ambulatory Visit: Payer: Self-pay | Admitting: Family Medicine

## 2020-01-22 DIAGNOSIS — F419 Anxiety disorder, unspecified: Secondary | ICD-10-CM

## 2020-01-22 DIAGNOSIS — M160 Bilateral primary osteoarthritis of hip: Secondary | ICD-10-CM

## 2020-01-22 NOTE — Telephone Encounter (Signed)
Requested medication (s) are due for refill today: Yes  Requested medication (s) are on the active medication list: Yes  Last refill:  Tramadol 07/03/19  Xanax  10/16/19  Future visit scheduled: Yes  Notes to clinic:  See request    Requested Prescriptions  Pending Prescriptions Disp Refills   traMADol (ULTRAM) 50 MG tablet [Pharmacy Med Name: traMADol HCL 50MG  TABLET] 90 tablet     Sig: TAKE ONE TABLET BY MOUTH THREE TIMES A DAY AS NEEDED      Not Delegated - Analgesics:  Opioid Agonists Failed - 01/22/2020 11:09 AM      Failed - This refill cannot be delegated      Failed - Urine Drug Screen completed in last 360 days      Passed - Valid encounter within last 6 months    Recent Outpatient Visits           2 months ago Parkinson disease (HCC)   [facility] 01/24/2020., MD   6 months ago Parkinson disease [facility])   [facility] MD   1 year ago Parkinson disease [facility])   [facility]., MD   1 year ago Bilateral impacted cerumen   [facility]., MD   2 years ago Other hyperlipidemia   [facility]., MD       Future Appointments             In 3 months Maple Hudson., MD [facility]               ALPRAZolam [facility]) 0.5 MG tablet [Pharmacy Med Name: ALPRAZolam 0.5 MG TABLET] 120 tablet     Sig: TAKE ONE TABLET BY MOUTH EVERY 4 HOURS AS NEEDED      Not Delegated - Psychiatry:  Anxiolytics/Hypnotics Failed - 01/22/2020 11:09 AM      Failed - This refill cannot be delegated      Failed - Urine Drug Screen completed in last 360 days      Passed - Valid encounter within last 6 months    Recent Outpatient Visits           2 months ago Parkinson disease [facility])   [facility] MD   6 months ago Parkinson disease [facility])   [facility]., MD   1 year ago Parkinson disease [facility])   [facility]., MD   1 year ago Bilateral impacted cerumen   [facility]., MD   2 years ago Other hyperlipidemia   [facility]., MD       Future Appointments             In 3 months Maple Hudson., MD [facility]

## 2020-01-23 NOTE — Telephone Encounter (Signed)
Husband calling to follow up on this med request.  He states she needs today and to please send it to the pharmacy.

## 2020-01-30 DIAGNOSIS — G20 Parkinson's disease: Secondary | ICD-10-CM | POA: Diagnosis not present

## 2020-01-30 DIAGNOSIS — R8290 Unspecified abnormal findings in urine: Secondary | ICD-10-CM | POA: Diagnosis not present

## 2020-01-30 DIAGNOSIS — E538 Deficiency of other specified B group vitamins: Secondary | ICD-10-CM | POA: Diagnosis not present

## 2020-02-20 ENCOUNTER — Telehealth: Payer: Self-pay | Admitting: Family Medicine

## 2020-02-20 DIAGNOSIS — M160 Bilateral primary osteoarthritis of hip: Secondary | ICD-10-CM

## 2020-02-20 NOTE — Telephone Encounter (Signed)
Suanne Marker, pts daughter, called stating that the pt is still in a lot of pain and is requesting to have PCP increase pts tramadol intake. She states that at the 3x a day she is still in pretty severe pain. Please advise.     [place]  [place] [address]  Phone: [phone] Fax: [phone]  Hours: Not open 24 hours

## 2020-02-21 ENCOUNTER — Other Ambulatory Visit: Payer: Self-pay | Admitting: Family Medicine

## 2020-02-21 DIAGNOSIS — F419 Anxiety disorder, unspecified: Secondary | ICD-10-CM

## 2020-02-25 NOTE — Telephone Encounter (Signed)
Please review. Thanks!  

## 2020-02-25 NOTE — Telephone Encounter (Signed)
Pts husband called to check on progress of this. Spoke with [place] in office and she states she will leave message for PCP. Please advise.

## 2020-02-25 NOTE — Telephone Encounter (Signed)
Suanne Marker called again and stated that she thinks the Pts tramadol needs to increase to 200MG  daily / pt is still in pain after taking current dose/ please advise

## 2020-02-26 NOTE — Telephone Encounter (Signed)
Can increase to every 6 hours as needed.

## 2020-02-26 NOTE — Telephone Encounter (Signed)
Patient's husband Jori Moll was advised.

## 2020-02-26 NOTE — Telephone Encounter (Signed)
Pts husband called to see if the increased dose of tramadol refill will be sent today to [place], [facility]  [address] [place] [address]  Phone:  [phone] Fax:  [phone]  Derrek Monaco advise

## 2020-02-27 ENCOUNTER — Other Ambulatory Visit: Payer: Self-pay | Admitting: Family Medicine

## 2020-02-27 MED ORDER — TRAMADOL HCL 50 MG PO TABS: 50.0000 mg | ORAL_TABLET | Freq: Four times a day (QID) | ORAL | 4 refills | Status: AC | PRN

## 2020-02-27 NOTE — Telephone Encounter (Signed)
[name] called to report that the pt is running low and is hoping to get this refilled soon. Please advise

## 2020-03-18 DIAGNOSIS — G4752 REM sleep behavior disorder: Secondary | ICD-10-CM | POA: Diagnosis not present

## 2020-03-18 DIAGNOSIS — I951 Orthostatic hypotension: Secondary | ICD-10-CM | POA: Diagnosis not present

## 2020-03-18 DIAGNOSIS — G20 Parkinson's disease: Secondary | ICD-10-CM | POA: Diagnosis not present

## 2020-03-18 DIAGNOSIS — G2581 Restless legs syndrome: Secondary | ICD-10-CM | POA: Diagnosis not present

## 2020-04-13 ENCOUNTER — Emergency Department: Payer: Medicare Other

## 2020-04-13 ENCOUNTER — Other Ambulatory Visit: Payer: Self-pay

## 2020-04-13 ENCOUNTER — Inpatient Hospital Stay
Admission: EM | Admit: 2020-04-13 | Discharge: 2020-04-21 | DRG: 056 | Disposition: A | Discharge status: Skilled Nursing Facility | Payer: Medicare Other | Attending: Internal Medicine | Admitting: Internal Medicine

## 2020-04-13 DIAGNOSIS — R778 Other specified abnormalities of plasma proteins: Secondary | ICD-10-CM

## 2020-04-13 DIAGNOSIS — E538 Deficiency of other specified B group vitamins: Secondary | ICD-10-CM | POA: Diagnosis present

## 2020-04-13 DIAGNOSIS — R55 Syncope and collapse: Secondary | ICD-10-CM | POA: Diagnosis not present

## 2020-04-13 DIAGNOSIS — E860 Dehydration: Secondary | ICD-10-CM | POA: Diagnosis not present

## 2020-04-13 DIAGNOSIS — Z6835 Body mass index (BMI) 35.0-35.9, adult: Secondary | ICD-10-CM

## 2020-04-13 DIAGNOSIS — Z043 Encounter for examination and observation following other accident: Secondary | ICD-10-CM | POA: Diagnosis not present

## 2020-04-13 DIAGNOSIS — Z66 Do not resuscitate: Secondary | ICD-10-CM | POA: Diagnosis not present

## 2020-04-13 DIAGNOSIS — I517 Cardiomegaly: Secondary | ICD-10-CM | POA: Diagnosis not present

## 2020-04-13 DIAGNOSIS — Z91048 Other nonmedicinal substance allergy status: Secondary | ICD-10-CM

## 2020-04-13 DIAGNOSIS — M160 Bilateral primary osteoarthritis of hip: Secondary | ICD-10-CM

## 2020-04-13 DIAGNOSIS — G20 Parkinson's disease: Secondary | ICD-10-CM | POA: Diagnosis not present

## 2020-04-13 DIAGNOSIS — R630 Anorexia: Secondary | ICD-10-CM | POA: Diagnosis present

## 2020-04-13 DIAGNOSIS — Z20822 Contact with and (suspected) exposure to covid-19: Secondary | ICD-10-CM | POA: Diagnosis not present

## 2020-04-13 DIAGNOSIS — G20A1 Parkinson's disease without dyskinesia, without mention of fluctuations: Secondary | ICD-10-CM | POA: Diagnosis present

## 2020-04-13 DIAGNOSIS — N179 Acute kidney failure, unspecified: Secondary | ICD-10-CM | POA: Diagnosis present

## 2020-04-13 DIAGNOSIS — N1831 Chronic kidney disease, stage 3a: Secondary | ICD-10-CM | POA: Diagnosis not present

## 2020-04-13 DIAGNOSIS — Z85828 Personal history of other malignant neoplasm of skin: Secondary | ICD-10-CM

## 2020-04-13 DIAGNOSIS — R0902 Hypoxemia: Secondary | ICD-10-CM | POA: Diagnosis not present

## 2020-04-13 DIAGNOSIS — R7989 Other specified abnormal findings of blood chemistry: Secondary | ICD-10-CM

## 2020-04-13 DIAGNOSIS — E669 Obesity, unspecified: Secondary | ICD-10-CM | POA: Diagnosis present

## 2020-04-13 DIAGNOSIS — W19XXXA Unspecified fall, initial encounter: Secondary | ICD-10-CM

## 2020-04-13 DIAGNOSIS — D519 Vitamin B12 deficiency anemia, unspecified: Secondary | ICD-10-CM

## 2020-04-13 DIAGNOSIS — Z9221 Personal history of antineoplastic chemotherapy: Secondary | ICD-10-CM

## 2020-04-13 DIAGNOSIS — G9341 Metabolic encephalopathy: Secondary | ICD-10-CM | POA: Diagnosis present

## 2020-04-13 DIAGNOSIS — Z96643 Presence of artificial hip joint, bilateral: Secondary | ICD-10-CM | POA: Diagnosis present

## 2020-04-13 DIAGNOSIS — J9811 Atelectasis: Secondary | ICD-10-CM | POA: Diagnosis not present

## 2020-04-13 DIAGNOSIS — R4182 Altered mental status, unspecified: Secondary | ICD-10-CM | POA: Diagnosis not present

## 2020-04-13 DIAGNOSIS — N183 Chronic kidney disease, stage 3 unspecified: Secondary | ICD-10-CM | POA: Diagnosis present

## 2020-04-13 DIAGNOSIS — Z888 Allergy status to other drugs, medicaments and biological substances status: Secondary | ICD-10-CM

## 2020-04-13 DIAGNOSIS — K5900 Constipation, unspecified: Secondary | ICD-10-CM | POA: Diagnosis present

## 2020-04-13 DIAGNOSIS — K224 Dyskinesia of esophagus: Secondary | ICD-10-CM | POA: Diagnosis present

## 2020-04-13 DIAGNOSIS — F419 Anxiety disorder, unspecified: Secondary | ICD-10-CM | POA: Diagnosis not present

## 2020-04-13 DIAGNOSIS — Z882 Allergy status to sulfonamides status: Secondary | ICD-10-CM

## 2020-04-13 DIAGNOSIS — M810 Age-related osteoporosis without current pathological fracture: Secondary | ICD-10-CM | POA: Diagnosis present

## 2020-04-13 DIAGNOSIS — Z85038 Personal history of other malignant neoplasm of large intestine: Secondary | ICD-10-CM

## 2020-04-13 DIAGNOSIS — Z885 Allergy status to narcotic agent status: Secondary | ICD-10-CM

## 2020-04-13 DIAGNOSIS — G909 Disorder of the autonomic nervous system, unspecified: Secondary | ICD-10-CM | POA: Diagnosis present

## 2020-04-13 DIAGNOSIS — Z7982 Long term (current) use of aspirin: Secondary | ICD-10-CM

## 2020-04-13 DIAGNOSIS — K219 Gastro-esophageal reflux disease without esophagitis: Secondary | ICD-10-CM | POA: Diagnosis present

## 2020-04-13 DIAGNOSIS — Z91018 Allergy to other foods: Secondary | ICD-10-CM

## 2020-04-13 DIAGNOSIS — Z853 Personal history of malignant neoplasm of breast: Secondary | ICD-10-CM | POA: Diagnosis not present

## 2020-04-13 DIAGNOSIS — E876 Hypokalemia: Secondary | ICD-10-CM | POA: Diagnosis present

## 2020-04-13 DIAGNOSIS — R296 Repeated falls: Secondary | ICD-10-CM | POA: Diagnosis present

## 2020-04-13 DIAGNOSIS — R112 Nausea with vomiting, unspecified: Secondary | ICD-10-CM

## 2020-04-13 DIAGNOSIS — Z923 Personal history of irradiation: Secondary | ICD-10-CM

## 2020-04-13 DIAGNOSIS — Z91011 Allergy to milk products: Secondary | ICD-10-CM

## 2020-04-13 DIAGNOSIS — M797 Fibromyalgia: Secondary | ICD-10-CM | POA: Diagnosis present

## 2020-04-13 DIAGNOSIS — Z79899 Other long term (current) drug therapy: Secondary | ICD-10-CM

## 2020-04-13 DIAGNOSIS — R531 Weakness: Secondary | ICD-10-CM | POA: Diagnosis not present

## 2020-04-13 DIAGNOSIS — Z886 Allergy status to analgesic agent status: Secondary | ICD-10-CM

## 2020-04-13 DIAGNOSIS — N1832 Chronic kidney disease, stage 3b: Secondary | ICD-10-CM | POA: Diagnosis present

## 2020-04-13 HISTORY — DX: Syncope and collapse: R55

## 2020-04-13 LAB — URINE DRUG SCREEN, QUALITATIVE (ARMC ONLY)
Amphetamines, Ur Screen: NOT DETECTED
Barbiturates, Ur Screen: NOT DETECTED
Benzodiazepine, Ur Scrn: POSITIVE — AB
Cannabinoid 50 Ng, Ur ~~LOC~~: NOT DETECTED
Cocaine Metabolite,Ur ~~LOC~~: NOT DETECTED
MDMA (Ecstasy)Ur Screen: NOT DETECTED
Methadone Scn, Ur: NOT DETECTED
Opiate, Ur Screen: NOT DETECTED
Phencyclidine (PCP) Ur S: NOT DETECTED
Tricyclic, Ur Screen: NOT DETECTED

## 2020-04-13 LAB — CBC WITH DIFFERENTIAL/PLATELET
Abs Immature Granulocytes: 0.04 10*3/uL (ref 0.00–0.07)
Basophils Absolute: 0 10*3/uL (ref 0.0–0.1)
Basophils Relative: 0 %
Eosinophils Absolute: 0 10*3/uL (ref 0.0–0.5)
Eosinophils Relative: 0 %
HCT: 39.4 % (ref 36.0–46.0)
Hemoglobin: 13.2 g/dL (ref 12.0–15.0)
Immature Granulocytes: 1 %
Lymphocytes Relative: 14 %
Lymphs Abs: 1.2 10*3/uL (ref 0.7–4.0)
MCH: 30.5 pg (ref 26.0–34.0)
MCHC: 33.5 g/dL (ref 30.0–36.0)
MCV: 91 fL (ref 80.0–100.0)
Monocytes Absolute: 0.6 10*3/uL (ref 0.1–1.0)
Monocytes Relative: 7 %
Neutro Abs: 6.7 10*3/uL (ref 1.7–7.7)
Neutrophils Relative %: 78 %
Platelets: 197 10*3/uL (ref 150–400)
RBC: 4.33 MIL/uL (ref 3.87–5.11)
RDW: 14.8 % (ref 11.5–15.5)
WBC: 8.6 10*3/uL (ref 4.0–10.5)
nRBC: 0 % (ref 0.0–0.2)

## 2020-04-13 LAB — PROTIME-INR
INR: 1 (ref 0.8–1.2)
Prothrombin Time: 13.2 seconds (ref 11.4–15.2)

## 2020-04-13 LAB — URINALYSIS, COMPLETE (UACMP) WITH MICROSCOPIC
Bacteria, UA: NONE SEEN
Bilirubin Urine: NEGATIVE
Glucose, UA: NEGATIVE mg/dL
Hgb urine dipstick: NEGATIVE
Ketones, ur: NEGATIVE mg/dL
Nitrite: NEGATIVE
Protein, ur: NEGATIVE mg/dL
Specific Gravity, Urine: 1.028 (ref 1.005–1.030)
pH: 8 (ref 5.0–8.0)

## 2020-04-13 LAB — RESP PANEL BY RT-PCR (FLU A&B, COVID) ARPGX2
Influenza A by PCR: NEGATIVE
Influenza B by PCR: NEGATIVE
SARS Coronavirus 2 by RT PCR: NEGATIVE

## 2020-04-13 LAB — BLOOD GAS, VENOUS
Acid-Base Excess: 8.3 mmol/L — ABNORMAL HIGH (ref 0.0–2.0)
Bicarbonate: 33.4 mmol/L — ABNORMAL HIGH (ref 20.0–28.0)
O2 Saturation: 75.7 %
Patient temperature: 37
pCO2, Ven: 47 mmHg (ref 44.0–60.0)
pH, Ven: 7.46 — ABNORMAL HIGH (ref 7.250–7.430)
pO2, Ven: 38 mmHg (ref 32.0–45.0)

## 2020-04-13 LAB — COMPREHENSIVE METABOLIC PANEL
ALT: 6 U/L (ref 0–44)
AST: 15 U/L (ref 15–41)
Albumin: 3.9 g/dL (ref 3.5–5.0)
Alkaline Phosphatase: 75 U/L (ref 38–126)
Anion gap: 11 (ref 5–15)
BUN: 16 mg/dL (ref 8–23)
CO2: 29 mmol/L (ref 22–32)
Calcium: 11.1 mg/dL — ABNORMAL HIGH (ref 8.9–10.3)
Chloride: 95 mmol/L — ABNORMAL LOW (ref 98–111)
Creatinine, Ser: 1.45 mg/dL — ABNORMAL HIGH (ref 0.44–1.00)
GFR, Estimated: 38 mL/min — ABNORMAL LOW (ref >60)
Glucose, Bld: 142 mg/dL — ABNORMAL HIGH (ref 70–99)
Potassium: 2.7 mmol/L — CL (ref 3.5–5.1)
Sodium: 135 mmol/L (ref 135–145)
Total Bilirubin: 1 mg/dL (ref 0.3–1.2)
Total Protein: 7.4 g/dL (ref 6.5–8.1)

## 2020-04-13 LAB — APTT: aPTT: 24 seconds (ref 24–36)

## 2020-04-13 LAB — TROPONIN I (HIGH SENSITIVITY): Troponin I (High Sensitivity): 25 ng/L — ABNORMAL HIGH (ref <18)

## 2020-04-13 LAB — PHOSPHORUS: Phosphorus: 1.7 mg/dL — ABNORMAL LOW (ref 2.5–4.6)

## 2020-04-13 LAB — MAGNESIUM: Magnesium: 1.8 mg/dL (ref 1.7–2.4)

## 2020-04-13 LAB — ETHANOL: Alcohol, Ethyl (B): <10 mg/dL (ref <10)

## 2020-04-13 LAB — CREATININE, URINE, RANDOM: Creatinine, Urine: 67 mg/dL

## 2020-04-13 LAB — SODIUM, URINE, RANDOM: Sodium, Ur: 43 mmol/L

## 2020-04-13 LAB — CK: Total CK: 81 U/L (ref 38–234)

## 2020-04-13 LAB — AMMONIA: Ammonia: 10 umol/L (ref 9–35)

## 2020-04-13 LAB — TSH: TSH: 1.44 u[IU]/mL (ref 0.350–4.500)

## 2020-04-13 MED ORDER — CARVEDILOL 6.25 MG PO TABS
3.1250 mg | ORAL_TABLET | Freq: Two times a day (BID) | ORAL | Status: DC
  Administered 2020-04-14 – 2020-04-21 (×15): 3.125 mg via ORAL
  Filled 2020-04-13 (×15): qty 1

## 2020-04-13 MED ORDER — SODIUM CHLORIDE 0.9 % IV SOLN
75.0000 mL/h | INTRAVENOUS | Status: AC
  Administered 2020-04-13: 75 mL/h via INTRAVENOUS

## 2020-04-13 MED ORDER — ACETAMINOPHEN 325 MG PO TABS
650.0000 mg | ORAL_TABLET | Freq: Four times a day (QID) | ORAL | Status: DC | PRN
  Administered 2020-04-14 – 2020-04-16 (×2): 650 mg via ORAL
  Filled 2020-04-13 (×2): qty 2

## 2020-04-13 MED ORDER — SODIUM CHLORIDE 0.9 % IV BOLUS
500.0000 mL | Freq: Once | INTRAVENOUS | Status: AC
  Administered 2020-04-13: 500 mL via INTRAVENOUS

## 2020-04-13 MED ORDER — POTASSIUM PHOSPHATES 15 MMOLE/5ML IV SOLN
10.0000 mmol | Freq: Once | INTRAVENOUS | Status: AC
  Administered 2020-04-14: 10 mmol via INTRAVENOUS
  Filled 2020-04-13: qty 3.33

## 2020-04-13 MED ORDER — POTASSIUM CHLORIDE 10 MEQ/100ML IV SOLN
10.0000 meq | INTRAVENOUS | Status: AC
  Administered 2020-04-13 (×2): 10 meq via INTRAVENOUS
  Filled 2020-04-13 (×2): qty 100

## 2020-04-13 MED ORDER — CARBIDOPA-LEVODOPA 25-100 MG PO TABS: 1.0000 | ORAL_TABLET | Freq: Once | ORAL | Status: DC

## 2020-04-13 MED ORDER — ALPRAZOLAM 0.5 MG PO TABS
0.5000 mg | ORAL_TABLET | Freq: Once | ORAL | Status: AC
  Administered 2020-04-13: 0.5 mg via ORAL
  Filled 2020-04-13: qty 1

## 2020-04-13 MED ORDER — CARBIDOPA-LEVODOPA 25-100 MG PO TABS
2.0000 | ORAL_TABLET | Freq: Once | ORAL | Status: AC
  Administered 2020-04-13: 2 via ORAL
  Filled 2020-04-13: qty 2

## 2020-04-13 MED ORDER — ALPRAZOLAM 0.25 MG PO TABS
0.5000 mg | ORAL_TABLET | Freq: Three times a day (TID) | ORAL | Status: DC | PRN
  Administered 2020-04-14 – 2020-04-21 (×16): 0.5 mg via ORAL
  Filled 2020-04-13 (×18): qty 1

## 2020-04-13 MED ORDER — CARBIDOPA-LEVODOPA 25-100 MG PO TABS
2.0000 | ORAL_TABLET | Freq: Four times a day (QID) | ORAL | Status: DC
  Administered 2020-04-14: 2 via ORAL
  Filled 2020-04-13 (×3): qty 2

## 2020-04-13 MED ORDER — IOHEXOL 350 MG/ML SOLN
60.0000 mL | Freq: Once | INTRAVENOUS | Status: AC | PRN
  Administered 2020-04-13: 60 mL via INTRAVENOUS

## 2020-04-13 NOTE — H&P (Signed)
Terri Perez GOT:[id number] DOB: 06-28-1945 DOA: 04/13/2020     PCP: Jerrol Banana., MD   Outpatient Specialists:     NEurology   Dr. Sunday Corn    Patient arrived to ER on 04/13/20 at 1533 Referred by Attending Vanessa Wilson, MD   Patient coming from: home Lives  With family    Chief Complaint:   Chief Complaint  Patient presents with  . Fall    HPI: Terri Perez is a 75 y.o. female with medical history significant of  Parkinson's disease, breast cancer, constipation, CKD stage3, Terri Perez    Presented with   Had a fall today no LOC no head injury. Feels dehydrated. She has had some constipation she was walking  And unsure how she fell. Woke up when EMS got it there. Unclear if she has been taking her Parkinson medications reports poor appetite  she has been trying to take MIralax for constipation for the past 2 days Reports frequent urination She has been very anxious and has been taking xanax frequently  Denies any CP or SOB No fever repots leg cramps Very Poor PO intake for the past week she has lost 15 lb  Infectious risk factors:  Reports fatigue    Has  been vaccinated against COVID    Initial COVID TEST  NEGATIVE   Lab Results  Component Value Date   [place] NEGATIVE 04/13/2020     Regarding pertinent Chronic problems:   Parkinson - on carbidopa/Levodopa     obesity-   BMI Readings from Last 1 Encounters:  04/13/20 36.12 kg/m      CKD stage IIIa- baseline Cr0.9 Estimated Creatinine Clearance: 36.2 mL/min (A) (by C-G formula based on SCr of 1.45 mg/dL (H)).  Lab Results  Component Value Date   CREATININE 1.45 (H) 04/13/2020   CREATININE 0.90 07/03/2019   CREATININE 0.93 12/24/2018    While in ER: Noted to be tachycardiac K down to 2.9, cr up to 1.4    ED Triage Vitals  Enc Vitals Group     BP 04/13/20 1539 139/63     Pulse Rate 04/13/20 1539 (!) 112     Resp 04/13/20 1539 20     Temp 04/13/20 1539 97.7 F (36.5 C)      Temp Source 04/13/20 1539 Oral     SpO2 04/13/20 1539 94 %     Weight 04/13/20 1554 203 lb 14.4 oz (92.5 kg)     Height 04/13/20 1554 5' 3" (1.6 m)     Head Circumference --      Peak Flow --      Pain Score 04/13/20 1553 0     Pain Loc --      Pain Edu? --      Excl. in [place]? --   TMAX(24)@     _________________________________________ Significant initial  Findings: Abnormal Labs Reviewed  COMPREHENSIVE METABOLIC PANEL - Abnormal; Notable for the following components:      Result Value   Potassium 2.7 (*)    Chloride 95 (*)    Glucose, Bld 142 (*)    Creatinine, Ser 1.45 (*)    Calcium 11.1 (*)    GFR, Estimated 38 (*)    All other components within normal limits  BLOOD GAS, VENOUS - Abnormal; Notable for the following components:   pH, Ven 7.46 (*)    Bicarbonate 33.4 (*)    Acid-Base Excess 8.3 (*)    All other components within normal limits  PHOSPHORUS - Abnormal; Notable for the following components:   Phosphorus 1.7 (*)    All other components within normal limits  TROPONIN I (HIGH SENSITIVITY) - Abnormal; Notable for the following components:   Troponin I (High Sensitivity) 25 (*)    All other components within normal limits  TROPONIN I (HIGH SENSITIVITY) - Abnormal; Notable for the following components:   Troponin I (High Sensitivity) 61 (*)    All other components within normal limits   ____________________________________________ Ordered CT HEAD/neck   NON acute  CXR -  NON acute  CTabd/pelvis -  Small BILATERAL nonobstructing renal calculi and small peripelvic LEFT renal cysts.  CTA chest -  nonacute, no PE,  no evidence of infiltrate  Cardiomegaly, coronary artery disease. _________________________ Troponin  25 -61  ECG: Ordered Personally reviewed by me showing: HR : 112 Rhythm:  Sinus tachycardia     no evidence of ischemic changes    Will order repeat The recent clinical data is shown below. Vitals:   04/13/20 1539 04/13/20 1554 04/13/20  1800 04/13/20 1900  BP: 139/63  (!) 159/65 (!) 165/74  Pulse: (!) 112  (!) 108 (!) 117  Resp: 20  (!) 25 19  Temp: 97.7 F (36.5 C)     TempSrc: Oral     SpO2: 94%  93% 94%  Weight:  92.5 kg    Height:  5' 3" (1.6 m)      WBC     Component Value Date/Time   WBC 8.6 04/13/2020 1613   LYMPHSABS 1.2 04/13/2020 1613     UA  ordered     Results for orders placed or performed during the hospital encounter of 04/13/20  Resp Panel by RT-PCR (Flu A&B, Covid) Nasopharyngeal Swab     Status: None   Collection Time: 04/13/20  4:10 PM   Specimen: Nasopharyngeal Swab; Nasopharyngeal(NP) swabs in vial transport medium  Result Value Ref Range Status   SARS Coronavirus 2 by RT PCR NEGATIVE NEGATIVE Final         Influenza A by PCR NEGATIVE NEGATIVE Final   Influenza B by PCR NEGATIVE NEGATIVE Final          _____ _______________________________________________ Hospitalist was called for admission for hypokalemia  The following Work up has been ordered so far:  Orders Placed This Encounter  Procedures  . 1-3 Lead EKG Interpretation  . Resp Panel by RT-PCR (Flu A&B, Covid) Nasopharyngeal Swab  . DG Chest 2 View  . CT Head Wo Contrast  . CT Cervical Spine Wo Contrast  . CT ABDOMEN PELVIS W CONTRAST  . CT Angio Chest PE W and/or Wo Contrast  . CBC with Differential  . Comprehensive metabolic panel  . Protime-INR  . APTT  . Magnesium  . Urinalysis, Complete w Microscopic  . CK  . Urine Drug Screen, Qualitative ([place] only)  . Consult to hospitalist  . Airborne and Contact precautions  . ED EKG    Following Medications were ordered in ER: Medications  potassium chloride 10 mEq in 100 mL IVPB (10 mEq Intravenous New Bag/Given 04/13/20 1801)  ALPRAZolam (XANAX) tablet 0.5 mg (has no administration in time range)  carbidopa-levodopa (SINEMET IR) 25-100 MG per tablet immediate release 2 tablet (has no administration in time range)  sodium chloride 0.9 % bolus 500 mL (500 mLs  Intravenous New Bag/Given 04/13/20 1647)  iohexol (OMNIPAQUE) 350 MG/ML injection 60 mL (60 mLs Intravenous Contrast Given 04/13/20 1734)        Consult  Orders  (From admission, onward)         Start     Ordered   04/13/20 1859  Consult to hospitalist  Once       Provider:  (Not yet assigned)  Question Answer Comment  Place call to: [phone]   Reason for Consult Admit      04/13/20 1900          OTHER Significant initial  Findings:  labs showing:    Recent Labs  Lab 04/13/20 1613 04/13/20 1618  NA 135  --   K 2.7*  --   CO2 29  --   GLUCOSE 142*  --   BUN 16  --   CREATININE 1.45*  --   CALCIUM 11.1*  --   MG 1.8  --   PHOS  --  1.7*    Cr     Up from baseline see below Lab Results  Component Value Date   CREATININE 1.45 (H) 04/13/2020   CREATININE 0.90 07/03/2019   CREATININE 0.93 12/24/2018    Recent Labs  Lab 04/13/20 1613  AST 15  ALT 6  ALKPHOS 75  BILITOT 1.0  PROT 7.4  ALBUMIN 3.9   Lab Results  Component Value Date   CALCIUM 11.1 (H) 04/13/2020      Plt: Lab Results  Component Value Date   PLT 197 04/13/2020         Venous  Blood Gas result:  PH 7.46 pCO2 47       Recent Labs  Lab 04/13/20 1613  WBC 8.6  NEUTROABS 6.7  HGB 13.2  HCT 39.4  MCV 91.0  PLT 197    HG/HCT   Stable     Component Value Date/Time   HGB 13.2 04/13/2020 1613   HGB 12.8 07/03/2019 1429   HCT 39.4 04/13/2020 1613   HCT 38.9 07/03/2019 1429   MCV 91.0 04/13/2020 1613   MCV 94 07/03/2019 1429   MCV 90 05/15/2014 1123    No results for input(s): LIPASE, AMYLASE in the last 168 hours. Recent Labs  Lab 04/13/20 1939  AMMONIA 10     Cardiac Panel (last 3 results) Recent Labs    04/13/20 1915  CKTOTAL 81          Cultures: No results found for: SDES, [place], CULT, REPTSTATUS   Radiological Exams on Admission: DG Chest 2 View  Result Date: 04/13/2020 CLINICAL DATA:  Status post fall. EXAM: CHEST - 2 VIEW COMPARISON:  Chest x-ray  04/01/2014 FINDINGS: The heart size and mediastinal contours are unchanged. Aortic arch calcifications. No focal consolidation. No pulmonary edema. No pleural effusion. No pneumothorax. No acute osseous abnormality. IMPRESSION: No acute cardiopulmonary abnormality. Electronically Signed   By: Iven Finn M.D.   On: 04/13/2020 17:13   CT Head Wo Contrast  Result Date: 04/13/2020 CLINICAL DATA:  Limited history, patient poor histroian. Hx of breast cancer and Parkinson's. Per nurse note: Arrives by EMS s/p fall. Did not hit head, no LOC. EXAM: CT HEAD WITHOUT CONTRAST CT CERVICAL SPINE WITHOUT CONTRAST TECHNIQUE: Multidetector CT imaging of the head and cervical spine was performed following the standard protocol without intravenous contrast. Multiplanar CT image reconstructions of the cervical spine were also generated. COMPARISON:  MR head 05/06/2016 FINDINGS: CT HEAD FINDINGS Brain: Cerebral ventricle sizes are concordant with the degree of cerebral volume loss. No evidence of large-territorial acute infarction. No parenchymal hemorrhage. No mass lesion. No extra-axial collection. No mass effect or midline shift. No  hydrocephalus. Basilar cisterns are patent. Vascular: No hyperdense vessel. Skull: No acute fracture or focal lesion. Sinuses/Orbits: Paranasal sinuses and mastoid air cells are clear. The orbits are unremarkable. Other: None. CT CERVICAL SPINE FINDINGS Alignment: Normal. Skull base and vertebrae: Diffusely decreased bone density. Multilevel mild degenerative changes of the spine. Partial fusion of the C5-C6 vertebral bodies. Mild to moderate intervertebral disc spaces at the C6-C7 level. No acute fracture. No aggressive appearing focal osseous lesion or focal pathologic process. Soft tissues and spinal canal: No prevertebral fluid or swelling. No visible canal hematoma. Upper chest: Unremarkable. Other: None. IMPRESSION: 1. No acute intracranial abnormality. 2. No acute displaced fracture or  traumatic listhesis of the cervical spine. Electronically Signed   By: Iven Finn M.D.   On: 04/13/2020 18:19   CT Angio Chest PE W and/or Wo Contrast  Result Date: 04/13/2020 CLINICAL DATA:  PE suspected, high probability. History of breast cancer. EXAM: CT ANGIOGRAPHY CHEST WITH CONTRAST TECHNIQUE: Multidetector CT imaging of the chest was performed using the standard protocol during bolus administration of intravenous contrast. Multiplanar CT image reconstructions and MIPs were obtained to evaluate the vascular anatomy. CONTRAST:  60m OMNIPAQUE IOHEXOL 350 MG/ML SOLN COMPARISON:  None. FINDINGS: Cardiovascular: No filling defects in the pulmonary arteries to suggest pulmonary emboli. Cardiomegaly. Coronary artery and aortic calcifications. No aneurysm. Mediastinum/Nodes: No mediastinal, hilar, or axillary adenopathy. Trachea and esophagus are unremarkable. Lungs/Pleura: Bibasilar atelectasis. No confluent opacities or effusions. Upper Abdomen: Imaging into the upper abdomen demonstrates no acute findings. Musculoskeletal: Chest wall soft tissues are unremarkable. No acute bony abnormality. Review of the MIP images confirms the above findings. IMPRESSION: No evidence of pulmonary embolus. Cardiomegaly, coronary artery disease. Bibasilar atelectasis. Aortic Atherosclerosis (ICD10-I70.0). Electronically Signed   By: KRolm BaptiseM.D.   On: 04/13/2020 18:19   CT Cervical Spine Wo Contrast  Result Date: 04/13/2020 CLINICAL DATA:  Limited history, patient poor histroian. Hx of breast cancer and Parkinson's. Per nurse note: Arrives by EMS s/p fall. Did not hit head, no LOC. EXAM: CT HEAD WITHOUT CONTRAST CT CERVICAL SPINE WITHOUT CONTRAST TECHNIQUE: Multidetector CT imaging of the head and cervical spine was performed following the standard protocol without intravenous contrast. Multiplanar CT image reconstructions of the cervical spine were also generated. COMPARISON:  MR head 05/06/2016 FINDINGS: CT HEAD  FINDINGS Brain: Cerebral ventricle sizes are concordant with the degree of cerebral volume loss. No evidence of large-territorial acute infarction. No parenchymal hemorrhage. No mass lesion. No extra-axial collection. No mass effect or midline shift. No hydrocephalus. Basilar cisterns are patent. Vascular: No hyperdense vessel. Skull: No acute fracture or focal lesion. Sinuses/Orbits: Paranasal sinuses and mastoid air cells are clear. The orbits are unremarkable. Other: None. CT CERVICAL SPINE FINDINGS Alignment: Normal. Skull base and vertebrae: Diffusely decreased bone density. Multilevel mild degenerative changes of the spine. Partial fusion of the C5-C6 vertebral bodies. Mild to moderate intervertebral disc spaces at the C6-C7 level. No acute fracture. No aggressive appearing focal osseous lesion or focal pathologic process. Soft tissues and spinal canal: No prevertebral fluid or swelling. No visible canal hematoma. Upper chest: Unremarkable. Other: None. IMPRESSION: 1. No acute intracranial abnormality. 2. No acute displaced fracture or traumatic listhesis of the cervical spine. Electronically Signed   By: MIven FinnM.D.   On: 04/13/2020 18:19   CT ABDOMEN PELVIS W CONTRAST  Result Date: 04/13/2020 CLINICAL DATA:  Fall, history breast cancer, Parkinson's disease EXAM: CT ABDOMEN AND PELVIS WITH CONTRAST TECHNIQUE: Multidetector CT imaging of the abdomen  and pelvis was performed using the standard protocol following bolus administration of intravenous contrast. Sagittal and coronal MPR images reconstructed from axial data set. CONTRAST:  39m OMNIPAQUE IOHEXOL 350 MG/ML SOLN IV. No oral contrast. COMPARISON:  06/01/2010 FINDINGS: Lower chest: Mild bibasilar atelectasis Hepatobiliary: Focal fatty infiltration of liver adjacent to falciform fissure. Liver and gallbladder otherwise normal appearance Pancreas: Atrophic without mass Spleen: Normal appearance Adrenals/Urinary Tract: Small BILATERAL  nonobstructing renal calculi. Small peripelvic LEFT renal cysts. Adrenal glands, kidneys, ureters, and bladder otherwise normal appearance Stomach/Bowel: Bowel anastomosis in pelvis, ileocolic. Prior subtotal colectomy with residual rectum and short segment of sigmoid colon. Remaining bowel loops unremarkable. Stomach normal appearance. Vascular/Lymphatic: Retroaortic LEFT renal vein. Aorta normal caliber. Mild atherosclerotic calcifications aorta. No adenopathy. Reproductive: Uterus surgically absent.  Probable atrophic ovaries. Other: Small umbilical hernia containing fat. No free air or free fluid. No inflammatory process. Musculoskeletal: Osseous demineralization. RIGHT hip prosthesis. Subacute to old fractures of the LEFT transverse processes of L1 and L2. Old fracture posterior LEFT twelfth rib. Superior endplate compression fracture of L2 with mild anterior height loss, appears old. IMPRESSION: Prior subtotal colectomy. Small umbilical hernia containing fat. Nonobstructing renal calculi. Small BILATERAL nonobstructing renal calculi and small peripelvic LEFT renal cysts. Subacute to old fractures of the LEFT twelfth rib, LEFT transverse processes of L1 and L2 and superior endplate compression fracture of L2. No acute intra-abdominal or intrapelvic abnormalities. Aortic Atherosclerosis (ICD10-I70.0). Electronically Signed   By: MLavonia DanaM.D.   On: 04/13/2020 18:21   _______________________________________________________________________________________________________ Latest  Blood pressure (!) 165/74, pulse (!) 117, temperature 97.7 F (36.5 C), temperature source Oral, resp. rate 19, height 5' 3" (1.6 m), weight 92.5 kg, SpO2 94 %.   Review of Systems:    Pertinent positives include:  fatigue, loss of appetite,  Constitutional:  No weight loss, night sweats, Fevers, chills, weight loss  HEENT:  No headaches, Difficulty swallowing,Tooth/dental problems,Sore throat,  No sneezing, itching, ear  ache, nasal congestion, post nasal drip,  Cardio-vascular:  No chest pain, Orthopnea, PND, anasarca, dizziness, palpitations.no Bilateral lower extremity swelling  GI:  No heartburn, indigestion, abdominal pain, nausea, vomiting, diarrhea, change in bowel habits, melena, blood in stool, hematemesis Resp:  no shortness of breath at rest. No dyspnea on exertion, No excess mucus, no productive cough, No non-productive cough, No coughing up of blood.No change in color of mucus.No wheezing. Skin:  no rash or lesions. No jaundice GU:  no dysuria, change in color of urine, no urgency or frequency. No straining to urinate.  No flank pain.  Musculoskeletal:  No joint pain or no joint swelling. No decreased range of motion. No back pain.  Psych:  No change in mood or affect. No depression or anxiety. No memory loss.  Neuro: no localizing neurological complaints, no tingling, no weakness, no double vision, no gait abnormality, no slurred speech, no confusion  All systems reviewed and apart from [place] are negative _______________________________________________________________________________________________ Past Medical History:   Past Medical History:  Diagnosis Date  . Arm fracture    right forearm, d/t fall  . Bowel trouble 2010  . [place]   LEFT LUMPECTOMY  . Cancer ([facility] 12/2009   Left UOQ breast tumor; wide excision,sn bx and whole breast radiation. Chemotherapy done. There was only a 346marea of residual tumor remaining. All sn were negative. This was ER-positive, PR-negative, HER-2 neu not over expressed tumor. The original ultrasound size was 2.6 cm(T2).  . H/O cystitis 2011  .  Malignant neoplasm of upper-outer quadrant of female breast [facility]) January 13, 2010   2+ centimeter tumor, neoadjuvant chemotherapy. On wide excision 3 mm area of residual tumor. Sentinel node negative. ER-30%, PR-negative, HER-2 not overexpressing.  . Parkinson disease ([place]) 04/2016    Dr Manuella Ghazi is pt s DR  . Personal history of chemotherapy 2012   BREAST CA  . Personal history of fibromyalgia 2002  . Personal history of malignant neoplasm of large intestine   . Personal history of radiation therapy 2012   BREAST CA  . Special screening for malignant neoplasms, colon   . Unspecified constipation      Past Surgical History:  Procedure Laterality Date  . ABDOMINAL HYSTERECTOMY  1974  . BACK SURGERY  1985   bone spurs between 5&6, used bone from left hip  . BREAST BIOPSY Left 2011   POS  . BREAST CYST ASPIRATION Left 1985  . BREAST LUMPECTOMY Left 2011  . BREAST SURGERY Left 2011   wide excision  . CERVICAL DISCECTOMY    . COLON RESECTION  Sept 2014   [facility]   . COLONOSCOPY  2010   Dr. Allen Norris, normal  . FEMUR FRACTURE SURGERY Right March 2016  . JOINT REPLACEMENT Bilateral Jan 2016 and March 2016   hip  . PORT-A-CATH REMOVAL  2013  . PORTACATH PLACEMENT  2011  . SKIN CANCER EXCISION  1980   face  . TUBAL LIGATION  1973  . UPPER GI ENDOSCOPY  08/20/07   gastritis without hemorrhage    Social History:  Ambulatory walker      reports that she has never smoked. She has never used smokeless tobacco. She reports that she does not drink alcohol and does not use drugs.    Family History:   Family History  Problem Relation Age of Onset  . Cancer Brother        colon  . Cancer Other        breast and ovarian cancers, relationships not listed  . Stroke Mother   . Osteoporosis Mother   . Depression Sister   . Depression Sister   . Depression Sister   . Cancer Other 52       niece  . Breast cancer Neg Hx    ______________________________________________________________________________________________ Allergies: Allergies  Allergen Reactions  . Alendronate Nausea Only    Gi symptoms  . Gluten Meal Nausea Only    Other reaction(s): NAUSEA Other reaction(s): NAUSEA Other reaction(s): NAUSEA  . Lac Bovis Nausea And Vomiting  . Milk-Related Compounds    . Nsaids     GI upset. Other reaction(s): Other (See Comments) Pt unsure  . Other Other (See Comments)    Other reaction(s): OTHER  . Oxycodone-Acetaminophen   . Oxycodone-Acetaminophen Other (See Comments)    Other reaction(s): NAUSEA  . Vicodin [Hydrocodone-Acetaminophen] Nausea Only, Diarrhea and Nausea And Vomiting  . Wheat Bran Other (See Comments)  . Sulfa Antibiotics Rash    Rash from steri strips  . Tape Rash    Rash from steri strips     Prior to Admission medications   Medication Sig Start Date End Date Taking? Authorizing Provider  alendronate (FOSAMAX) 70 MG tablet Take 1 tablet (70 mg total) by mouth once a week. Take with a full glass of water on an empty stomach. Patient not taking: Reported on 11/04/2019 07/03/19   Jerrol Banana., MD  ALPRAZolam Duanne Moron) 0.5 MG tablet TAKE ONE TABLET BY MOUTH EVERY 4 HOURS AS NEEDED 02/21/20  Jerrol Banana., MD  aspirin-acetaminophen-caffeine [facility] MIGRAINE) [phone] MG tablet Take by mouth every 8 (eight) hours as needed. Reported on 06/19/2015 06/16/14   [provider]  carbidopa-levodopa (SINEMET IR) 25-100 MG tablet Take 250 tablets by mouth 3 (three) times daily.  07/30/16   [provider]  gabapentin (NEURONTIN) 300 MG capsule Take 300 mg by mouth 3 (three) times daily. 11/08/18   [provider]  mirtazapine (REMERON) 30 MG tablet Take by mouth. Patient not taking: Reported on 11/04/2019 06/27/11   [provider]  ondansetron (ZOFRAN-ODT) 4 MG disintegrating tablet Take 1 tablet (4 mg total) by mouth every 8 (eight) hours as needed for nausea or vomiting. 11/04/19   Jerrol Banana., MD  predniSONE (DELTASONE) 20 MG tablet Take 1 tablet (20 mg total) by mouth daily with breakfast. 07/03/19   Jerrol Banana., MD  QUEtiapine (SEROQUEL) 25 MG tablet Take 25 mg by mouth at bedtime. 12/06/18   [provider]  rOPINIRole (REQUIP XL) 2 MG 24 hr tablet Take 2 mg  by mouth at bedtime. 11/19/18   [provider]  traMADol (ULTRAM) 50 MG tablet TAKE ONE TABLET BY MOUTH THREE TIMES A DAY AS NEEDED 01/23/20   Chrismon, Vickki Muff, PA-C   ___________________________________________________________________________________________________ Physical Exam: Vitals with BMI 04/13/2020 04/13/2020 04/13/2020  Height - - 5' 3"  Weight - - 203 lbs 14 oz  BMI - - 82.70  Systolic 786 754 -  Diastolic 74 65 -  Pulse 492 108 -    1. General:  in No Acute distress   Chronically ill  -appearing 2. Psychological: Alert and  Oriented 3. Head/ENT:    Dry Mucous Membranes                          Head Non traumatic, neck supple                            Poor Dentition 4. SKIN:   decreased Skin turgor,  Skin clean Dry and intact no rash 5. Heart: Regular rate and rhythm no  Murmur, no Rub or gallop 6. Lungs:   no wheezes or crackles   7. Abdomen: Soft,  non-tender, Non distended  bowel sounds present 8. Lower extremities: no clubbing, cyanosis, no  edema 9. Neurologically Grossly intact, moving all 4 extremities equally   10. MSK: Normal range of motion    Chart has been reviewed  ______________________________________________________________________________________________  Assessment/Plan  75 y.o. female with medical history significant of  Parkinson's disease, breast cancer, constipation, CKD stage3, Gerd Admitted for dehydration with possible syncope and hypokalemia   Present on Admission:   . Syncope and collapse - given risk factor will admit rehydrate obtain CE, monitor on tele and obtain carotid dopplers and echo   Elevated troponin - no CP, likely in the setting of dehydration, demand ischemia, order ECHo in AM,  Will send a msg to cardiology   . Parkinson disease ([place]) - continue home meds  . Dehydration - will rehydrate  Acute encephalopathy -   - most likely multifactorial secondary to combination of  dehydration secondary to decreased by  mouth intake, polypharmacy   - Will rehydrate    - Hold contributing medications  Improving with rehydration   - neurological exam appears to be nonfocal but patient unable to cooperate fully   - VBG unremarkable no evidence of hypercarbia    -  no history of liver disease ammonia unremarkable   . Chronic kidney disease (CKD), stage III (moderate) (HCC) -  -chronic avoid nephrotoxic medications such as NSAIDs, Vanco Zosyn combo,  avoid hypotension, continue to follow renal function  AKI - -  evidence of acute renal failure due to presence of following: Cr increased >0.3 from baseline   likely secondary to dehydration,        check FeNA       Rehydrate with IV fluids   Tachycardia - multifactorial combination of dehydration and anxiety Check TSH, ECHO, rehydrate, cont anxiolytics to prevent withdraw Given elevated BP start BB  . Acute anxiety - will be carefull with home anxiolytics   . Hypokalemia - - will replace and repeat in AM,  check magnesium level and replace as needed Other plan as per orders.  DVT prophylaxis:  SCD     Code Status:    Code Status: Not on file  DNR/DNI  as per patient   I had personally discussed CODE STATUS with patient      Family Communication:   Family not at  Bedside    Disposition Plan:         To home once workup is complete and patient is stable   Following barriers for discharge:                            Electrolytes corrected                                                     Will need to be able to tolerate PO                            Will likely need home health, home O2, set up                                         Would benefit from PT/OT eval prior to DC  Ordered                   Swallow eval - SLP ordered                                    Transition of care consulted                   Nutrition    consulted                                      Consults called: none  Admission status:  ED Disposition    ED  Disposition Condition [place]: [place] [[id number]]  Level of Care: Med-Surg [16]  Covid Evaluation: Confirmed COVID Negative  Diagnosis: Hypokalemia [[id number]]  Admitting Physician: Toy Baker [[id number]]  Attending Physician: Toy Baker [[id number]]       Obs   Level of care     tele  For 24H      Lab  Results  Component Value Date   [place] NEGATIVE 04/13/2020     Precautions: admitted as   Covid Negative   PPE: Used by the provider:   N95  eye Goggles,  Gloves     Jolanta Cabeza 04/13/2020, 9:17 PM    [facility]     after 2 AM please page floor coverage PA If 7AM-7PM, please contact the day team taking care of the patient using [email or web address]   Patient was evaluated in the context of the global COVID-19 pandemic, which necessitated consideration that the patient might be at risk for infection with the SARS-CoV-2 virus that causes COVID-19. Institutional protocols and algorithms that pertain to the evaluation of patients at risk for COVID-19 are in a state of rapid change based on information released by regulatory bodies including the CDC and federal and state organizations. These policies and algorithms were followed during the patient's care.

## 2020-04-13 NOTE — ED Provider Notes (Signed)
[facility] Emergency Department Provider Note  ____________________________________________   Event Date/Time   First MD Initiated Contact with Patient 04/13/20 1534     (approximate)  I have reviewed the triage vital signs and the nursing notes.   HISTORY  Chief Complaint Fall    HPI Terri Perez is a 75 y.o. female with prior breast cancer who comes in for weakness.  Initially EMS told me that patient came in due to weakness.  Stated that she had a normal sugar and normal vital signs.  However when EMS spoke to the nurse they had told the nurse that patient had had a fall but did not hit her head.  Patient is alert and oriented x1.  I try to ask her questions she just keeps stating that her mouth feels dry and that she cannot talk to me right now.  She is got equal grip strength but does not move her legs secondary to just feeling weak.  After ice chips patient does give [place] a little bit more information.  Pt reports having issues with constipation and took some laxative. Pt had had bowel movement. She later got up and was walking into living room and then she doesn't remember what happened but had a fall. She woke up with EMS finding her. Pt reports parkingsons and has missed levodopa/carbidopa doses but states that she has been taking these pills every hour. Pt reports poor appetite.  Full HPI hard to obtain due to confusion.             Past Medical History:  Diagnosis Date  . Arm fracture    right forearm, d/t fall  . Bowel trouble 2010  . Breast cancer ([place]) 12/2009   LEFT LUMPECTOMY  . Cancer [facility]) 12/2009   Left UOQ breast tumor; wide excision,sn bx and whole breast radiation. Chemotherapy done. There was only a 4m area of residual tumor remaining. All sn were negative. This was ER-positive, PR-negative, HER-2 neu not over expressed tumor. The original ultrasound size was 2.6 cm(T2).  . H/O cystitis 2011  . Malignant neoplasm of  upper-outer quadrant of female breast ([facility] January 13, 2010   2+ centimeter tumor, neoadjuvant chemotherapy. On wide excision 3 mm area of residual tumor. Sentinel node negative. ER-30%, PR-negative, HER-2 not overexpressing.  . [place] 04[place]   Dr SManuella Ghaziis pt s DR  . Personal history of chemotherapy 2012   BREAST CA  . Personal history of fibromyalgia 2002  . Personal history of malignant neoplasm of large intestine   . Personal history of radiation therapy 2012   BREAST CA  . Special screening for malignant neoplasms, colon   . Unspecified constipation     Patient Active Problem List   Diagnosis Date Noted  . 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 Fracture of sacrum ([place] 08/04/2016  . Adiposity 05/05/2016  . [place] disease ([place] 04/25/2016  . 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 Bilateral lower extremity edema 07/23/2015  . 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 Chronic kidney disease (CKD), stage III (moderate) [place] 05/11/2015  . Elevated rheumatoid factor 05/11/2015  . Primary osteoarthritis of both hips 05/11/2015  . Episode of syncope 04/24/2015  . History of operative procedure on hip 04/24/2015  . Restless leg 04/24/2015  . Osteoporosis 03/30/2015  . Insomnia 03/30/2015  . Skin cancer 03/30/2015  . MDD (major depressive disorder) 03/30/2015  . Migraine 03/30/2015  .  GERD (gastroesophageal reflux disease) 03/30/2015  . IBS (irritable bowel syndrome) 03/30/2015  . OA (osteoarthritis) 03/30/2015  . Skin lesion of chest wall 07/17/2014  . Closed fracture of shaft of femur ([facility]) 04/01/2014  . Fracture of bone adjacent to prosthesis 04/01/2014  . Breast cancer ([facility]) 03/07/2013  . Personal history of malignant neoplasm of large intestine   . 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 Acute anxiety 06/11/2008  . CONSTIPATION, CHRONIC 06/11/2008   . Fibromyalgia 06/11/2008    Past Surgical History:  Procedure Laterality Date  . ABDOMINAL HYSTERECTOMY  1974  . BACK SURGERY  1985   bone spurs between 5&6, used bone from left hip  . BREAST BIOPSY Left 2011   POS  . BREAST CYST ASPIRATION Left 1985  . BREAST LUMPECTOMY Left 2011  . BREAST SURGERY Left 2011   wide excision  . CERVICAL DISCECTOMY    . COLON RESECTION  Sept 2014   [facility]   . COLONOSCOPY  2010   Dr. Servando Snare, normal  . FEMUR FRACTURE SURGERY Right March 2016  . JOINT REPLACEMENT Bilateral Jan 2016 and March 2016   hip  . PORT-A-CATH REMOVAL  2013  . PORTACATH PLACEMENT  2011  . SKIN CANCER EXCISION  1980   face  . TUBAL LIGATION  1973  . UPPER GI ENDOSCOPY  08/20/07   gastritis without hemorrhage    Prior to Admission medications   Medication Sig Start Date End Date Taking? Authorizing Provider  alendronate (FOSAMAX) 70 MG tablet Take 1 tablet (70 mg total) by mouth once a week. Take with a full glass of water on an empty stomach. Patient not taking: Reported on 11/04/2019 07/03/19   Maple [place]., MD  ALPRAZolam Prudy Feeler) 0.5 MG tablet TAKE ONE TABLET BY MOUTH EVERY 4 HOURS AS NEEDED 02/21/20   Maple [place]., MD  aspirin-acetaminophen-caffeine [facility] MIGRAINE) [phone] MG tablet Take by mouth every 8 (eight) hours as needed. Reported on 06/19/2015 06/16/14   [provider]  carbidopa-levodopa (SINEMET IR) 25-100 MG tablet Take 250 tablets by mouth 3 (three) times daily.  07/30/16   [provider]  gabapentin (NEURONTIN) 300 MG capsule Take 300 mg by mouth 3 (three) times daily. 11/08/18   [provider]  mirtazapine (REMERON) 30 MG tablet Take by mouth. Patient not taking: Reported on 11/04/2019 06/27/11   [provider]  ondansetron (ZOFRAN-ODT) 4 MG disintegrating tablet Take 1 tablet (4 mg total) by mouth every 8 (eight) hours as needed for nausea or vomiting. 11/04/19   Maple [place]., MD   predniSONE (DELTASONE) 20 MG tablet Take 1 tablet (20 mg total) by mouth daily with breakfast. 07/03/19   Maple [place]., MD  QUEtiapine (SEROQUEL) 25 MG tablet Take 25 mg by mouth at bedtime. 12/06/18   [provider]  rOPINIRole (REQUIP XL) 2 MG 24 hr tablet Take 2 mg by mouth at bedtime. 11/19/18   [provider]  traMADol (ULTRAM) 50 MG tablet TAKE ONE TABLET BY MOUTH THREE TIMES A DAY AS NEEDED 01/23/20   Chrismon, Jodell Cipro, PA-C    Allergies Alendronate, Gluten meal, Lac bovis, Milk-related compounds, Nsaids, Other, Oxycodone-acetaminophen, Oxycodone-acetaminophen, Vicodin [hydrocodone-acetaminophen], Wheat bran, Sulfa antibiotics, and Tape  Family History  Problem Relation Age of Onset  . Cancer Brother        colon  . Cancer Other        breast and ovarian cancers, relationships  not listed  . Stroke Mother   . Osteoporosis Mother   . Depression Sister   . Depression Sister   . Depression Sister   . Cancer Other 81       niece  . Breast cancer Neg Hx     Social History Social History   Tobacco Use  . Smoking status: Never Smoker  . Smokeless tobacco: Never Used  Substance Use Topics  . Alcohol use: No  . Drug use: No      Review of Systems ROS is limited due to patients confusion  ____________________________________________   PHYSICAL EXAM:  VITAL SIGNS: Blood pressure 139/63, pulse (!) 112, temperature 97.7 F (36.5 C), temperature source Oral, resp. rate 20, height $RemoveBe'5\' 3"'xLUYILfSB$  (1.6 m), weight 92.5 kg, SpO2 94 %.   Constitutional: Alert and oriented x1. Pt seems confused  Eyes: Conjunctivae are normal. EOMI. Head: Atraumatic. Nose: No congestion/rhinnorhea. Mouth/Throat: Mucous membranes are moist.   Neck: No stridor. Trachea Midline. FROM Cardiovascular: tachycardiac, regular rhythm. Grossly normal heart sounds.  Good peripheral circulation. Respiratory: Normal respiratory effort.  No retractions. Lungs CTAB. Gastrointestinal:  Soft and nontender. No distention. No abdominal bruits.  Musculoskeletal: No lower extremity tenderness nor edema.  No joint effusions. Neurologic:  Normal speech and language. Weak grip strength bilateral. Not willing to lift legs bilaterally. No obvious cranial deficits but limited due to her not following commands  Skin:  Skin is warm, dry and intact. No rash noted. Psychiatric: Mood and affect are normal. Speech and behavior are normal. GU: Deferred   ____________________________________________   LABS (all labs ordered are listed, but only abnormal results are displayed)  Labs Reviewed  RESP PANEL BY RT-PCR (FLU A&B, COVID) ARPGX2  CBC WITH DIFFERENTIAL/PLATELET  COMPREHENSIVE METABOLIC PANEL  PROTIME-INR  APTT  MAGNESIUM  URINALYSIS, COMPLETE (UACMP) WITH MICROSCOPIC  CK  TROPONIN I (HIGH SENSITIVITY)   ____________________________________________   ED ECG REPORT I, Vanessa Gray, the attending physician, personally viewed and interpreted this ECG.  Sinus tachycardia rate of 112, no ST elevation, T wave inversion in lead III, normal intervals ____________________________________________  RADIOLOGY Robert Bellow, personally viewed and evaluated these images (plain radiographs) as part of my medical decision making, as well as reviewing the written report by the radiologist.  ED MD interpretation:  No pna   Official radiology report(s): DG Chest 2 View  Result Date: 04/13/2020 CLINICAL DATA:  Status post fall. EXAM: CHEST - 2 VIEW COMPARISON:  Chest x-ray 04/01/2014 FINDINGS: The heart size and mediastinal contours are unchanged. Aortic arch calcifications. No focal consolidation. No pulmonary edema. No pleural effusion. No pneumothorax. No acute osseous abnormality. IMPRESSION: No acute cardiopulmonary abnormality. Electronically Signed   By: Iven Finn M.D.   On: 04/13/2020 17:13   CT Head Wo Contrast  Result Date: 04/13/2020 CLINICAL DATA:  Limited history,  patient poor histroian. Hx of breast cancer and Parkinson's. Per nurse note: Arrives by EMS s/p fall. Did not hit head, no LOC. EXAM: CT HEAD WITHOUT CONTRAST CT CERVICAL SPINE WITHOUT CONTRAST TECHNIQUE: Multidetector CT imaging of the head and cervical spine was performed following the standard protocol without intravenous contrast. Multiplanar CT image reconstructions of the cervical spine were also generated. COMPARISON:  MR head 05/06/2016 FINDINGS: CT HEAD FINDINGS Brain: Cerebral ventricle sizes are concordant with the degree of cerebral volume loss. No evidence of large-territorial acute infarction. No parenchymal hemorrhage. No mass lesion. No extra-axial collection. No mass effect or midline shift. No hydrocephalus. Basilar cisterns are  patent. Vascular: No hyperdense vessel. Skull: No acute fracture or focal lesion. Sinuses/Orbits: Paranasal sinuses and mastoid air cells are clear. The orbits are unremarkable. Other: None. CT CERVICAL SPINE FINDINGS Alignment: Normal. Skull base and vertebrae: Diffusely decreased bone density. Multilevel mild degenerative changes of the spine. Partial fusion of the C5-C6 vertebral bodies. Mild to moderate intervertebral disc spaces at the C6-C7 level. No acute fracture. No aggressive appearing focal osseous lesion or focal pathologic process. Soft tissues and spinal canal: No prevertebral fluid or swelling. No visible canal hematoma. Upper chest: Unremarkable. Other: None. IMPRESSION: 1. No acute intracranial abnormality. 2. No acute displaced fracture or traumatic listhesis of the cervical spine. Electronically Signed   By: Iven Finn M.D.   On: 04/13/2020 18:19   CT Angio Chest PE W and/or Wo Contrast  Result Date: 04/13/2020 CLINICAL DATA:  PE suspected, high probability. History of breast cancer. EXAM: CT ANGIOGRAPHY CHEST WITH CONTRAST TECHNIQUE: Multidetector CT imaging of the chest was performed using the standard protocol during bolus administration of  intravenous contrast. Multiplanar CT image reconstructions and MIPs were obtained to evaluate the vascular anatomy. CONTRAST:  75m OMNIPAQUE IOHEXOL 350 MG/ML SOLN COMPARISON:  None. FINDINGS: Cardiovascular: No filling defects in the pulmonary arteries to suggest pulmonary emboli. Cardiomegaly. Coronary artery and aortic calcifications. No aneurysm. Mediastinum/Nodes: No mediastinal, hilar, or axillary adenopathy. Trachea and esophagus are unremarkable. Lungs/Pleura: Bibasilar atelectasis. No confluent opacities or effusions. Upper Abdomen: Imaging into the upper abdomen demonstrates no acute findings. Musculoskeletal: Chest wall soft tissues are unremarkable. No acute bony abnormality. Review of the MIP images confirms the above findings. IMPRESSION: No evidence of pulmonary embolus. Cardiomegaly, coronary artery disease. Bibasilar atelectasis. Aortic Atherosclerosis (ICD10-I70.0). Electronically Signed   By: KRolm BaptiseM.D.   On: 04/13/2020 18:19   CT Cervical Spine Wo Contrast  Result Date: 04/13/2020 CLINICAL DATA:  Limited history, patient poor histroian. Hx of breast cancer and Parkinson's. Per nurse note: Arrives by EMS s/p fall. Did not hit head, no LOC. EXAM: CT HEAD WITHOUT CONTRAST CT CERVICAL SPINE WITHOUT CONTRAST TECHNIQUE: Multidetector CT imaging of the head and cervical spine was performed following the standard protocol without intravenous contrast. Multiplanar CT image reconstructions of the cervical spine were also generated. COMPARISON:  MR head 05/06/2016 FINDINGS: CT HEAD FINDINGS Brain: Cerebral ventricle sizes are concordant with the degree of cerebral volume loss. No evidence of large-territorial acute infarction. No parenchymal hemorrhage. No mass lesion. No extra-axial collection. No mass effect or midline shift. No hydrocephalus. Basilar cisterns are patent. Vascular: No hyperdense vessel. Skull: No acute fracture or focal lesion. Sinuses/Orbits: Paranasal sinuses and mastoid air  cells are clear. The orbits are unremarkable. Other: None. CT CERVICAL SPINE FINDINGS Alignment: Normal. Skull base and vertebrae: Diffusely decreased bone density. Multilevel mild degenerative changes of the spine. Partial fusion of the C5-C6 vertebral bodies. Mild to moderate intervertebral disc spaces at the C6-C7 level. No acute fracture. No aggressive appearing focal osseous lesion or focal pathologic process. Soft tissues and spinal canal: No prevertebral fluid or swelling. No visible canal hematoma. Upper chest: Unremarkable. Other: None. IMPRESSION: 1. No acute intracranial abnormality. 2. No acute displaced fracture or traumatic listhesis of the cervical spine. Electronically Signed   By: MIven FinnM.D.   On: 04/13/2020 18:19   CT ABDOMEN PELVIS W CONTRAST  Result Date: 04/13/2020 CLINICAL DATA:  Fall, history breast cancer, Parkinson's disease EXAM: CT ABDOMEN AND PELVIS WITH CONTRAST TECHNIQUE: Multidetector CT imaging of the abdomen and pelvis was performed  using the standard protocol following bolus administration of intravenous contrast. Sagittal and coronal MPR images reconstructed from axial data set. CONTRAST:  35m OMNIPAQUE IOHEXOL 350 MG/ML SOLN IV. No oral contrast. COMPARISON:  06/01/2010 FINDINGS: Lower chest: Mild bibasilar atelectasis Hepatobiliary: Focal fatty infiltration of liver adjacent to falciform fissure. Liver and gallbladder otherwise normal appearance Pancreas: Atrophic without mass Spleen: Normal appearance Adrenals/Urinary Tract: Small BILATERAL nonobstructing renal calculi. Small peripelvic LEFT renal cysts. Adrenal glands, kidneys, ureters, and bladder otherwise normal appearance Stomach/Bowel: Bowel anastomosis in pelvis, ileocolic. Prior subtotal colectomy with residual rectum and short segment of sigmoid colon. Remaining bowel loops unremarkable. Stomach normal appearance. Vascular/Lymphatic: Retroaortic LEFT renal vein. Aorta normal caliber. Mild atherosclerotic  calcifications aorta. No adenopathy. Reproductive: Uterus surgically absent.  Probable atrophic ovaries. Other: Small umbilical hernia containing fat. No free air or free fluid. No inflammatory process. Musculoskeletal: Osseous demineralization. RIGHT hip prosthesis. Subacute to old fractures of the LEFT transverse processes of L1 and L2. Old fracture posterior LEFT twelfth rib. Superior endplate compression fracture of L2 with mild anterior height loss, appears old. IMPRESSION: Prior subtotal colectomy. Small umbilical hernia containing fat. Nonobstructing renal calculi. Small BILATERAL nonobstructing renal calculi and small peripelvic LEFT renal cysts. Subacute to old fractures of the LEFT twelfth rib, LEFT transverse processes of L1 and L2 and superior endplate compression fracture of L2. No acute intra-abdominal or intrapelvic abnormalities. Aortic Atherosclerosis (ICD10-I70.0). Electronically Signed   By: MLavonia DanaM.D.   On: 04/13/2020 18:21    ____________________________________________   PROCEDURES  Procedure(s) performed (including Critical Care):  .1-3 Lead EKG Interpretation Performed by: FVanessa Agra MD Authorized by: FVanessa [place] MD     Interpretation: abnormal     ECG rate:  110   ECG rate assessment: tachycardic     Rhythm: sinus tachycardia     Ectopy: none     Conduction: normal       ____________________________________________   INITIAL IMPRESSION / ASSESSMENT AND PLAN / ED COURSE  DWILENE PHAROwas evaluated in Emergency Department on 04/13/2020 for the symptoms described in the history of present illness. She was evaluated in the context of the global COVID-19 pandemic, which necessitated consideration that the patient might be at risk for infection with the SARS-CoV-2 virus that causes COVID-19. Institutional protocols and algorithms that pertain to the evaluation of patients at risk for COVID-19 are in a state of rapid change based on information released  by regulatory bodies including the CDC and federal and state organizations. These policies and algorithms were followed during the patient's care in the ED.    Patient is a 75year old who comes in for weakness with a possible fall.  I attempted to call the husband x2 and was not able to get any answer.  I am concerned patient had a syncopal episode given patient's not able to tell [place] happened and she does seem confused and is well oriented x1.  No obvious trauma to the head.  Will get labs to evaluate for Electra abnormalities, AKI, ACS, urine to evaluate for UTI.  CT head to evaluate for intercranial hemorrhage and CT cervical evaluate for cervical fracture given patient is confused.  Patient is tachycardic and slightly hypoxic at 94% given the concern for possible syncope will get CT PE to evaluate for pulmonary embolism.  She also reports some groin pain and issues with bowel movements therefore I will get a CT scan to make sure no evidence of SBO, hip fracture.  We  will keep patient on cardiac monitor given tachycardia and give 500 cc of fluid.  Will get Covid swab since I suspect patient will need admission to the hospital  Labs show a potassium of 2.7 will give 20 of IV potassium Her creatinine is elevated at 1.45 patient is gotten some fluids Her calcium is elevated 11.1 Troponin is 25  CT head is negative CT cervical is negative CT abdomen and pelvis shows old fractures of the ribs and transverse processes PE scan is negative  Reevaluated patient now alert and oriented x3 but still does not remember exactly why she fell.  I discussed with family that given her Electra abnormalities and elevated troponin the setting of possible syncope I would recommend admission.  Patient states that she wants to get her Xanax.  I wonder if there is a component of patient taking too much sedating medicine given the Xanax and tramadol.  Told her that I would give her 1 dose of Xanax given that she is now  alert and oriented x3 but I would not give her any of her  Tramadol.  We will discussed the hospital team for admission      ____________________________________________   FINAL CLINICAL IMPRESSION(S) / ED DIAGNOSES   Final diagnoses:  Fall, initial encounter  Syncope and collapse  AKI (acute kidney injury) ([place])  Hypokalemia      MEDICATIONS GIVEN DURING THIS VISIT:  Medications  carbidopa-levodopa (SINEMET IR) 25-100 MG per tablet immediate release 2 tablet (has no administration in time range)  potassium chloride 10 mEq in 100 mL IVPB (has no administration in time range)  sodium chloride 0.9 % bolus 500 mL (has no administration in time range)  acetaminophen (TYLENOL) tablet 650 mg (has no administration in time range)  sodium chloride 0.9 % bolus 500 mL (0 mLs Intravenous Stopped 04/13/20 1911)  potassium chloride 10 mEq in 100 mL IVPB (0 mEq Intravenous Stopped 04/13/20 2018)  iohexol (OMNIPAQUE) 350 MG/ML injection 60 mL (60 mLs Intravenous Contrast Given 04/13/20 1734)  ALPRAZolam (XANAX) tablet 0.5 mg (0.5 mg Oral Given 04/13/20 1910)     ED Discharge Orders    None       Note:  This document was prepared using Dragon voice recognition software and may include unintentional dictation errors.   Vanessa Albion, MD 04/13/20 2019

## 2020-04-13 NOTE — ED Triage Notes (Signed)
Arrives by EMS s/p fall. Did not hit head, no LOC. Pt has no focal pain, "just wanted to get checked out."

## 2020-04-13 NOTE — ED Notes (Signed)
Respiratory notified of VBG sent to lab.  °

## 2020-04-14 ENCOUNTER — Observation Stay: Payer: Medicare Other

## 2020-04-14 ENCOUNTER — Observation Stay (HOSPITAL_COMMUNITY)
Admit: 2020-04-14 | Discharge: 2020-04-14 | Disposition: A | Discharge status: Home / Self Care | Payer: Medicare Other | Attending: Nurse Practitioner | Admitting: Nurse Practitioner

## 2020-04-14 ENCOUNTER — Encounter: Payer: Self-pay | Admitting: Internal Medicine

## 2020-04-14 ENCOUNTER — Observation Stay: Admit: 2020-04-14 | Payer: Medicare Other

## 2020-04-14 ENCOUNTER — Inpatient Hospital Stay: Payer: Medicare Other

## 2020-04-14 DIAGNOSIS — M810 Age-related osteoporosis without current pathological fracture: Secondary | ICD-10-CM | POA: Diagnosis not present

## 2020-04-14 DIAGNOSIS — E876 Hypokalemia: Secondary | ICD-10-CM | POA: Diagnosis present

## 2020-04-14 DIAGNOSIS — K219 Gastro-esophageal reflux disease without esophagitis: Secondary | ICD-10-CM | POA: Diagnosis not present

## 2020-04-14 DIAGNOSIS — M4606 Spinal enthesopathy, lumbar region: Secondary | ICD-10-CM | POA: Diagnosis not present

## 2020-04-14 DIAGNOSIS — Z9181 History of falling: Secondary | ICD-10-CM | POA: Diagnosis not present

## 2020-04-14 DIAGNOSIS — Z66 Do not resuscitate: Secondary | ICD-10-CM | POA: Diagnosis present

## 2020-04-14 DIAGNOSIS — E538 Deficiency of other specified B group vitamins: Secondary | ICD-10-CM | POA: Diagnosis not present

## 2020-04-14 DIAGNOSIS — E669 Obesity, unspecified: Secondary | ICD-10-CM | POA: Diagnosis not present

## 2020-04-14 DIAGNOSIS — Z886 Allergy status to analgesic agent status: Secondary | ICD-10-CM | POA: Diagnosis not present

## 2020-04-14 DIAGNOSIS — K224 Dyskinesia of esophagus: Secondary | ICD-10-CM | POA: Diagnosis present

## 2020-04-14 DIAGNOSIS — Z7982 Long term (current) use of aspirin: Secondary | ICD-10-CM | POA: Diagnosis not present

## 2020-04-14 DIAGNOSIS — M6281 Muscle weakness (generalized): Secondary | ICD-10-CM | POA: Diagnosis not present

## 2020-04-14 DIAGNOSIS — R112 Nausea with vomiting, unspecified: Secondary | ICD-10-CM

## 2020-04-14 DIAGNOSIS — G2581 Restless legs syndrome: Secondary | ICD-10-CM | POA: Diagnosis not present

## 2020-04-14 DIAGNOSIS — R55 Syncope and collapse: Secondary | ICD-10-CM

## 2020-04-14 DIAGNOSIS — R252 Cramp and spasm: Secondary | ICD-10-CM | POA: Diagnosis not present

## 2020-04-14 DIAGNOSIS — R488 Other symbolic dysfunctions: Secondary | ICD-10-CM | POA: Diagnosis not present

## 2020-04-14 DIAGNOSIS — Z888 Allergy status to other drugs, medicaments and biological substances status: Secondary | ICD-10-CM | POA: Diagnosis not present

## 2020-04-14 DIAGNOSIS — E860 Dehydration: Secondary | ICD-10-CM | POA: Diagnosis present

## 2020-04-14 DIAGNOSIS — M069 Rheumatoid arthritis, unspecified: Secondary | ICD-10-CM | POA: Diagnosis not present

## 2020-04-14 DIAGNOSIS — W19XXXA Unspecified fall, initial encounter: Secondary | ICD-10-CM | POA: Diagnosis not present

## 2020-04-14 DIAGNOSIS — N1832 Chronic kidney disease, stage 3b: Secondary | ICD-10-CM | POA: Diagnosis present

## 2020-04-14 DIAGNOSIS — N179 Acute kidney failure, unspecified: Secondary | ICD-10-CM | POA: Diagnosis present

## 2020-04-14 DIAGNOSIS — R279 Unspecified lack of coordination: Secondary | ICD-10-CM | POA: Diagnosis not present

## 2020-04-14 DIAGNOSIS — G9341 Metabolic encephalopathy: Secondary | ICD-10-CM | POA: Diagnosis present

## 2020-04-14 DIAGNOSIS — G20 Parkinson's disease: Secondary | ICD-10-CM | POA: Diagnosis present

## 2020-04-14 DIAGNOSIS — I248 Other forms of acute ischemic heart disease: Secondary | ICD-10-CM | POA: Diagnosis not present

## 2020-04-14 DIAGNOSIS — Z885 Allergy status to narcotic agent status: Secondary | ICD-10-CM | POA: Diagnosis not present

## 2020-04-14 DIAGNOSIS — D513 Other dietary vitamin B12 deficiency anemia: Secondary | ICD-10-CM | POA: Diagnosis not present

## 2020-04-14 DIAGNOSIS — R1310 Dysphagia, unspecified: Secondary | ICD-10-CM | POA: Diagnosis not present

## 2020-04-14 DIAGNOSIS — M160 Bilateral primary osteoarthritis of hip: Secondary | ICD-10-CM | POA: Diagnosis not present

## 2020-04-14 DIAGNOSIS — R748 Abnormal levels of other serum enzymes: Secondary | ICD-10-CM | POA: Diagnosis not present

## 2020-04-14 DIAGNOSIS — F419 Anxiety disorder, unspecified: Secondary | ICD-10-CM | POA: Diagnosis present

## 2020-04-14 DIAGNOSIS — R778 Other specified abnormalities of plasma proteins: Secondary | ICD-10-CM | POA: Diagnosis not present

## 2020-04-14 DIAGNOSIS — I6523 Occlusion and stenosis of bilateral carotid arteries: Secondary | ICD-10-CM | POA: Diagnosis not present

## 2020-04-14 DIAGNOSIS — Z91048 Other nonmedicinal substance allergy status: Secondary | ICD-10-CM | POA: Diagnosis not present

## 2020-04-14 DIAGNOSIS — Z6835 Body mass index (BMI) 35.0-35.9, adult: Secondary | ICD-10-CM | POA: Diagnosis not present

## 2020-04-14 DIAGNOSIS — Z91011 Allergy to milk products: Secondary | ICD-10-CM | POA: Diagnosis not present

## 2020-04-14 DIAGNOSIS — R5381 Other malaise: Secondary | ICD-10-CM | POA: Diagnosis not present

## 2020-04-14 DIAGNOSIS — Z882 Allergy status to sulfonamides status: Secondary | ICD-10-CM | POA: Diagnosis not present

## 2020-04-14 DIAGNOSIS — R1312 Dysphagia, oropharyngeal phase: Secondary | ICD-10-CM | POA: Diagnosis not present

## 2020-04-14 DIAGNOSIS — G909 Disorder of the autonomic nervous system, unspecified: Secondary | ICD-10-CM | POA: Diagnosis present

## 2020-04-14 DIAGNOSIS — Z79899 Other long term (current) drug therapy: Secondary | ICD-10-CM | POA: Diagnosis not present

## 2020-04-14 DIAGNOSIS — Z20822 Contact with and (suspected) exposure to covid-19: Secondary | ICD-10-CM | POA: Diagnosis present

## 2020-04-14 DIAGNOSIS — Z853 Personal history of malignant neoplasm of breast: Secondary | ICD-10-CM | POA: Diagnosis not present

## 2020-04-14 DIAGNOSIS — Z91018 Allergy to other foods: Secondary | ICD-10-CM | POA: Diagnosis not present

## 2020-04-14 DIAGNOSIS — M797 Fibromyalgia: Secondary | ICD-10-CM | POA: Diagnosis not present

## 2020-04-14 LAB — BASIC METABOLIC PANEL
Anion gap: 8 (ref 5–15)
BUN: 13 mg/dL (ref 8–23)
CO2: 28 mmol/L (ref 22–32)
Calcium: 9.7 mg/dL (ref 8.9–10.3)
Chloride: 101 mmol/L (ref 98–111)
Creatinine, Ser: 1.08 mg/dL — ABNORMAL HIGH (ref 0.44–1.00)
GFR, Estimated: 54 mL/min — ABNORMAL LOW (ref >60)
Glucose, Bld: 114 mg/dL — ABNORMAL HIGH (ref 70–99)
Potassium: 2.9 mmol/L — ABNORMAL LOW (ref 3.5–5.1)
Sodium: 137 mmol/L (ref 135–145)

## 2020-04-14 LAB — ECHOCARDIOGRAM COMPLETE
AR max vel: 2.13 cm2
AV Area VTI: 2.04 cm2
AV Area mean vel: 2.17 cm2
AV Mean grad: 3 mmHg
AV Peak grad: 6 mmHg
Ao pk vel: 1.22 m/s
Area-P 1/2: 4.52 cm2
Height: 63 in
MV VTI: 3.33 cm2
S' Lateral: 3.7 cm
Weight: 3214.4 oz

## 2020-04-14 LAB — TROPONIN I (HIGH SENSITIVITY)
Troponin I (High Sensitivity): 61 ng/L — ABNORMAL HIGH
Troponin I (High Sensitivity): 65 ng/L — ABNORMAL HIGH (ref <18)
Troponin I (High Sensitivity): 65 ng/L — ABNORMAL HIGH (ref <18)
Troponin I (High Sensitivity): 66 ng/L — ABNORMAL HIGH (ref <18)
Troponin I (High Sensitivity): 53 ng/L — ABNORMAL HIGH (ref <18)

## 2020-04-14 LAB — COMPREHENSIVE METABOLIC PANEL
ALT: <5 U/L (ref 0–44)
AST: 12 U/L — ABNORMAL LOW (ref 15–41)
Albumin: 3.2 g/dL — ABNORMAL LOW (ref 3.5–5.0)
Alkaline Phosphatase: 59 U/L (ref 38–126)
Anion gap: 5 (ref 5–15)
BUN: 13 mg/dL (ref 8–23)
CO2: 31 mmol/L (ref 22–32)
Calcium: 9.6 mg/dL (ref 8.9–10.3)
Chloride: 102 mmol/L (ref 98–111)
Creatinine, Ser: 1 mg/dL (ref 0.44–1.00)
GFR, Estimated: 59 mL/min — ABNORMAL LOW (ref >60)
Glucose, Bld: 104 mg/dL — ABNORMAL HIGH (ref 70–99)
Potassium: 3.1 mmol/L — ABNORMAL LOW (ref 3.5–5.1)
Sodium: 138 mmol/L (ref 135–145)
Total Bilirubin: 0.8 mg/dL (ref 0.3–1.2)
Total Protein: 5.9 g/dL — ABNORMAL LOW (ref 6.5–8.1)

## 2020-04-14 LAB — CBC WITH DIFFERENTIAL/PLATELET
Abs Immature Granulocytes: 0.07 10*3/uL (ref 0.00–0.07)
Basophils Absolute: 0 10*3/uL (ref 0.0–0.1)
Basophils Relative: 0 %
Eosinophils Absolute: 0 10*3/uL (ref 0.0–0.5)
Eosinophils Relative: 0 %
HCT: 34.8 % — ABNORMAL LOW (ref 36.0–46.0)
Hemoglobin: 11.1 g/dL — ABNORMAL LOW (ref 12.0–15.0)
Immature Granulocytes: 1 %
Lymphocytes Relative: 13 %
Lymphs Abs: 1.5 10*3/uL (ref 0.7–4.0)
MCH: 29.7 pg (ref 26.0–34.0)
MCHC: 31.9 g/dL (ref 30.0–36.0)
MCV: 93 fL (ref 80.0–100.0)
Monocytes Absolute: 0.9 10*3/uL (ref 0.1–1.0)
Monocytes Relative: 8 %
Neutro Abs: 8.7 10*3/uL — ABNORMAL HIGH (ref 1.7–7.7)
Neutrophils Relative %: 78 %
Platelets: 168 10*3/uL (ref 150–400)
RBC: 3.74 MIL/uL — ABNORMAL LOW (ref 3.87–5.11)
RDW: 14.9 % (ref 11.5–15.5)
WBC: 11.1 10*3/uL — ABNORMAL HIGH (ref 4.0–10.5)
nRBC: 0 % (ref 0.0–0.2)

## 2020-04-14 LAB — POTASSIUM: Potassium: 3.6 mmol/L (ref 3.5–5.1)

## 2020-04-14 LAB — MAGNESIUM: Magnesium: 1.6 mg/dL — ABNORMAL LOW (ref 1.7–2.4)

## 2020-04-14 LAB — TSH: TSH: 0.652 u[IU]/mL (ref 0.350–4.500)

## 2020-04-14 LAB — GLUCOSE, CAPILLARY: Glucose-Capillary: 129 mg/dL — ABNORMAL HIGH (ref 70–99)

## 2020-04-14 LAB — PHOSPHORUS: Phosphorus: 2.2 mg/dL — ABNORMAL LOW (ref 2.5–4.6)

## 2020-04-14 MED ORDER — MAGNESIUM SULFATE 2 GM/50ML IV SOLN
2.0000 g | Freq: Once | INTRAVENOUS | Status: AC
  Administered 2020-04-14: 2 g via INTRAVENOUS
  Filled 2020-04-14: qty 50

## 2020-04-14 MED ORDER — ADULT MULTIVITAMIN W/MINERALS CH
1.0000 | ORAL_TABLET | Freq: Every day | ORAL | Status: DC
  Administered 2020-04-15 – 2020-04-21 (×5): 1 via ORAL
  Filled 2020-04-14 (×7): qty 1

## 2020-04-14 MED ORDER — CARBIDOPA-LEVODOPA 25-100 MG PO TABS
2.0000 | ORAL_TABLET | Freq: Every day | ORAL | Status: DC
  Administered 2020-04-15 – 2020-04-20 (×6): 2 via ORAL
  Filled 2020-04-14 (×7): qty 2

## 2020-04-14 MED ORDER — CARBIDOPA-LEVODOPA 25-100 MG PO TABS: 2.0000 | ORAL_TABLET | Freq: Three times a day (TID) | ORAL | Status: DC

## 2020-04-14 MED ORDER — K PHOS MONO-SOD PHOS DI & MONO 155-852-130 MG PO TABS
500.0000 mg | ORAL_TABLET | Freq: Once | ORAL | Status: DC
  Filled 2020-04-14: qty 2

## 2020-04-14 MED ORDER — ASPIRIN EC 81 MG PO TBEC
81.0000 mg | DELAYED_RELEASE_TABLET | Freq: Every day | ORAL | Status: DC
Start: 2020-04-14 — End: 2020-04-21
  Administered 2020-04-15 – 2020-04-21 (×7): 81 mg via ORAL
  Filled 2020-04-14 (×7): qty 1

## 2020-04-14 MED ORDER — CARBIDOPA-LEVODOPA 25-100 MG PO TABS
2.0000 | ORAL_TABLET | Freq: Four times a day (QID) | ORAL | Status: DC
  Administered 2020-04-14 – 2020-04-21 (×27): 2 via ORAL
  Filled 2020-04-14 (×30): qty 2

## 2020-04-14 MED ORDER — SODIUM CHLORIDE 0.9 % IV SOLN: INTRAVENOUS | Status: DC

## 2020-04-14 MED ORDER — PERFLUTREN LIPID MICROSPHERE
1.0000 mL | INTRAVENOUS | Status: AC | PRN
  Administered 2020-04-14: 2 mL via INTRAVENOUS
  Filled 2020-04-14: qty 10

## 2020-04-14 MED ORDER — POTASSIUM CHLORIDE 10 MEQ/100ML IV SOLN
10.0000 meq | INTRAVENOUS | Status: AC
  Administered 2020-04-14 (×4): 10 meq via INTRAVENOUS
  Filled 2020-04-14 (×4): qty 100

## 2020-04-14 MED ORDER — K PHOS MONO-SOD PHOS DI & MONO 155-852-130 MG PO TABS
500.0000 mg | ORAL_TABLET | ORAL | Status: DC
  Administered 2020-04-14: 500 mg via ORAL
  Filled 2020-04-14 (×2): qty 2

## 2020-04-14 MED ORDER — CALCIUM CARBONATE ANTACID 500 MG PO CHEW
1.0000 | CHEWABLE_TABLET | Freq: Three times a day (TID) | ORAL | Status: DC | PRN
  Administered 2020-04-14: 200 mg via ORAL
  Filled 2020-04-14 (×3): qty 1

## 2020-04-14 MED ORDER — HALOPERIDOL LACTATE 5 MG/ML IJ SOLN
2.0000 mg | INTRAMUSCULAR | Status: DC | PRN
  Administered 2020-04-14: 2 mg via INTRAVENOUS
  Filled 2020-04-14: qty 1

## 2020-04-14 MED ORDER — BOOST / RESOURCE BREEZE PO LIQD CUSTOM
1.0000 | Freq: Three times a day (TID) | ORAL | Status: DC
  Administered 2020-04-15 – 2020-04-20 (×11): 1 via ORAL

## 2020-04-14 NOTE — Evaluation (Signed)
Clinical/Bedside Swallow Evaluation Patient Details  Name: Terri Perez MRN: [id number] Date of Birth: Sep 19, 1945  Today's Date: 04/14/2020 Time: SLP Start Time (ACUTE ONLY): 20 SLP Stop Time (ACUTE ONLY): 1330 SLP Time Calculation (min) (ACUTE ONLY): 60 min  Past Medical History:  Past Medical History:  Diagnosis Date  . Arm fracture    right forearm, d/t fall  . Bowel trouble 2010  . Breast cancer ([place]) 12/2009   LEFT LUMPECTOMY  . Cancer [facility]) 12/2009   Left UOQ breast tumor; wide excision,sn bx and whole breast radiation. Chemotherapy done. There was only a 45mm area of residual tumor remaining. All sn were negative. This was ER-positive, PR-negative, HER-2 neu not over expressed tumor. The original ultrasound size was 2.6 cm(T2).  . H/O cystitis 2011  . 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 On wide excision 3 mm area of residual tumor. Sentinel node negative. ER-30%, PR-negative, HER-2 not overexpressing.  . Parkinson disease ([place]) 04/2016   Dr Manuella Ghazi is pt s DR  . Personal history of chemotherapy 2012   BREAST CA  . Personal history of fibromyalgia 2002  . Personal history of malignant neoplasm of large intestine   . Personal history of radiation therapy 2012   BREAST CA  . Special screening for malignant neoplasms, colon   . Unspecified constipation    Past Surgical History:  Past Surgical History:  Procedure Laterality Date  . ABDOMINAL HYSTERECTOMY  1974  . BACK SURGERY  1985   bone spurs between 5&6, used bone from left hip  . BREAST BIOPSY Left 2011   POS  . BREAST CYST ASPIRATION Left 1985  . BREAST LUMPECTOMY Left 2011  . BREAST SURGERY Left 2011   wide excision  . CERVICAL DISCECTOMY    . COLON RESECTION  Sept 2014   [facility]   . COLONOSCOPY  2010   Dr. Allen Norris, normal  . FEMUR FRACTURE SURGERY Right March 2016  . JOINT REPLACEMENT Bilateral Jan 2016 and March  2016   hip  . PORT-A-CATH REMOVAL  2013  . PORTACATH PLACEMENT  2011  . SKIN CANCER EXCISION  1980   face  . TUBAL LIGATION  1973  . UPPER GI ENDOSCOPY  08/20/07   gastritis without hemorrhage   HPI:  Terri Perez is a 75 y.o. female with prior breast cancer who comes in for weakness. Pt has Parkinsons disease and has had poor appetite for weeks. Apparently fell at home but did not hit her head.   Assessment / Plan / Recommendation Clinical Impression  Bedside swallow eval was limited secondary to fatigue and needing periods of rest. Husband was present and supportive. Oral mech exam revealed structures to be functioning adequately. Vocal quality was hoarse and weak with slow articulation. Pt also has a diagnosis of Parkinsons, has had weight loss and poor appetite. Pt reports the need to spit up food and liquid frequently. Noted bag of frothy thick sputum Pt had spit up just prior to ST entering today. Pt tolerated a few sips of thin water but was noted to swallow several times reporting it wouldn't go down. Delayed cough x1 indicating likely laryngeal penetration. Pt tolerated 3 bites of applesauce but again was noted to swallow several times and need periods of rest. Husband brought in Chick fila which Pt wanted to try but kept saying she needed to rest every time she started to place the food in  her mouth. Discussed recommendation for Ba swallow or GI intervention to r/o esophageal stricture. Also agreed to try a pureed diet since she wasn't eating much of the solid consistencies either secondary to fear or fatigue. Pt agreed to try the new diet. Will add other items such as magic cup and pudding in the event that she does not like the pureed foods. ST to follow up after Ba swallow to determine needs. SLP Visit Diagnosis: Dysphagia, oropharyngeal phase (R13.12)    Aspiration Risk  Moderate aspiration risk    Diet Recommendation Dysphagia 1 (Puree)   Medication Administration: Crushed  with puree Compensations: Small sips/bites;Slow rate;Minimize environmental distractions;Multiple dry swallows after each bite/sip Postural Changes: Seated upright at 90 degrees;Remain upright for at least 30 minutes after po intake    Other  Recommendations Recommended Consults: Consider GI evaluation;Other (Comment) (Conside Ba swallow)   Follow up Recommendations   Depending on results of Ba swallow. May need GI consult     Frequency and Duration min 3x week  2 weeks       Prognosis Prognosis for Safe Diet Advancement: Terri Perez Date of Onset: 04/13/20 HPI: Terri Perez is a 75 y.o. female with prior breast cancer who comes in for weakness. Pt has Parkinsons disease and has had poor appetite for weeks. Apparently fell at home but did not hit her head. Type of Study: Bedside Swallow Evaluation Diet Prior to this Study: Dysphagia 3 (soft) Temperature Spikes Noted: No Respiratory Status: Room air History of Recent Intubation: No Behavior/Cognition: Cooperative;Lethargic/Drowsy Oral Cavity Assessment: Within Functional Limits Oral Care Completed by SLP: No Oral Cavity - Dentition: Adequate natural dentition Self-Feeding Abilities: Able to feed self Patient Positioning: Upright in bed Baseline Vocal Quality: Hoarse    Oral/Motor/Sensory Function Overall Oral Motor/Sensory Function: Within functional limits   Ice Chips Ice chips: Within functional limits   Thin Liquid Thin Liquid: Impaired Presentation: Cup;Straw;Spoon Pharyngeal  Phase Impairments: Suspected delayed Swallow;Decreased hyoid-laryngeal movement;Multiple swallows;Cough - Delayed    Nectar Thick Nectar Thick Liquid: Not tested   Honey Thick     Puree Puree: Impaired Presentation:  (multiple swallow per bolus) Pharyngeal Phase Impairments: Multiple swallows   Solid     Solid: Impaired Oral Phase Impairments: Impaired mastication Oral Phase Functional Implications: Impaired  mastication;Prolonged oral transit;Other (comment) (extreme fatigue) Pharyngeal Phase Impairments: Multiple swallows;Throat Clearing - Delayed;Throat Clearing - Immediate      Lucila Maine 04/14/2020,1:38 PM

## 2020-04-14 NOTE — Progress Notes (Signed)
PT Cancellation Note  Patient Details Name: Terri Perez MRN: [id number] DOB: 06/28/1945   Cancelled Treatment:    Reason Eval/Treat Not Completed: Other (comment). Pt out of room, unable to participate with therapy. Will re-attempt next date.   Ray,Stephanie 04/14/2020, 2:31 PM  Greggory Stallion, PT, DPT [phone]

## 2020-04-14 NOTE — Consult Note (Signed)
Cardiology Consult    [name] MRN: [id number], DOB/AGE: 1945/12/23   Admit date: 04/13/2020 Date of Consult: 04/14/2020  Primary Physician: Jerrol Banana., MD Primary Cardiologist: Dr. Saunders Revel - New Requesting Provider: Dr. Debbe Odea  Patient Profile    Terri Perez is a 75 y.o. female with a history of breast cancer, colon resection for neoplasm, Parkinson's, obesity, bilateral hip replacement, and femur fracture, who is being seen today for the evaluation of elevated Hs Troponin at the request of Dr. Wynelle Cleveland.  Past Medical History   Past Medical History:  Diagnosis Date  . Arm fracture    right forearm, d/t fall  . Bowel trouble 2010  . Breast cancer ([place]) 12/2009   LEFT LUMPECTOMY  . Cancer [facility]) 12/2009   Left UOQ breast tumor; wide excision,sn bx and whole breast radiation. Chemotherapy done. There was only a 19m area of residual tumor remaining. All sn were negative. This was ER-positive, PR-negative, HER-2 neu not over expressed tumor. The original ultrasound size was 2.6 cm(T2).  . H/O cystitis 2011  . Malignant neoplasm of upper-outer quadrant of female breast ([facility] January 13, 2010   2+ centimeter tumor, neoadjuvant chemotherapy. On wide excision 3 mm area of residual tumor. Sentinel node negative. ER-30%, PR-negative, HER-2 not overexpressing.  . Parkinson disease ([facility] 04/2016   Dr SManuella Ghaziis pt s DR  . Personal history of chemotherapy 2012   BREAST CA  . Personal history of fibromyalgia 2002  . Personal history of malignant neoplasm of large intestine   . Personal history of radiation therapy 2012   BREAST CA  . Special screening for malignant neoplasms, colon   . Syncope    a. 03/2020 Echo: EF 50-55%, Gr1 DD, nl RV fxn, triv MR.  .Marland KitchenUnspecified constipation     Past Surgical History:  Procedure Laterality Date  . ABDOMINAL HYSTERECTOMY  1974  . BACK SURGERY  1985   bone spurs between 5&6, used bone from left hip  . BREAST  BIOPSY Left 2011   POS  . BREAST CYST ASPIRATION Left 1985  . BREAST LUMPECTOMY Left 2011  . BREAST SURGERY Left 2011   wide excision  . CERVICAL DISCECTOMY    . [facility]  . COLONOSCOPY  2010   Dr. WAllen Norris normal  . FEMUR FRACTURE SURGERY Right March 2016  . JOINT REPLACEMENT Bilateral Jan 2016 and March 2016   hip  . PORT-A-CATH REMOVAL  2013  . PORTACATH PLACEMENT  2011  . SKIN CANCER EXCISION  1980   face  . TUBAL LIGATION  1973  . UPPER GI ENDOSCOPY  08/20/07   gastritis without hemorrhage     Allergies  Allergies  Allergen Reactions  . Alendronate Nausea Only    Gi symptoms  . Gluten Meal Nausea Only    Other reaction(s): NAUSEA Other reaction(s): NAUSEA Other reaction(s): NAUSEA  . Lactose Intolerance (Gi) Diarrhea  . Nsaids     GI upset. Other reaction(s): Other (See Comments) Pt unsure  . Other Other (See Comments)    Other reaction(s): OTHER  . Oxycodone-Acetaminophen   . Oxycodone-Acetaminophen Other (See Comments)    Other reaction(s): NAUSEA  . Vicodin [Hydrocodone-Acetaminophen] Nausea Only, Diarrhea and Nausea And Vomiting  . Wheat Bran Other (See Comments)  . Sulfa Antibiotics Rash    Rash from steri strips  . Tape Rash    Rash from steri strips    History  of Present Illness    Terri Perez is a 75 y/o female with a history of breast cancer, colon resection for neoplasm, bilateral hip replacement, Parkinson's disease, obesity, and prior R femur fracture who is being evaluated for elevated Hs Troponin following a fall on 3/21. She was brought to the ED by EMS following a fall at home.  Pt and husband note that over the past several months, she has had at least 3-4 falls in the setting of frequent orthostasis occurring immediately upon standing, along with leg weakness and development of sudden lightheadedness/presyncope, when walking in her home.  Simply walking to and from the bathroom may result in weakness and  presyncope and her husband generally follows her w/ a wheelchair so that she can stop and sit prior to falling/potentially losing consciousness.  She has been eval by her PCP and neurologist for these symptoms.  Patient reports that Dr. Manuella Ghazi gave her medicine that has made her want to sleep all the time (likely ropinirole - recently d/c'd) and that she has no appetite because food doesn't taste right.  At a recent visit with neurology, Dr. Sunday Corn documented that there was concern over compliance with appropriate doses of medications and requested that patient get pill packs from pharmacy.  Neurology also felt that ongoing orthostatic symptoms and gait instability were 2/2 parkinson's.  She had another episode of presyncope/leg weakness while walking in her home on 3/21, prompting presentation to the ED.  Upon arrival, she reported feeling weak and having dry mouth. She reported to ED staff that she had fallen and hit her head. Her lab work revealed AKI, while ECG and cardiac monitoring revealed sinus tachycardia. HsTrop has been mildly elevated w/ flat trend - peak of 66.  She reports to me today that she was walking at home back and forth between the living room and the back of the house when she felt like "the lights were going dim" and she tried to sit down on the seat on her walker but fell to the ground.  Her husband says that like on other occasions, on 3/21, she seemed disoriented and confused after falling.  Neither he nor she are sure how long she was w/o consciousness, but pt says she remembers striking a table and hearing picture frames falling, but nothing else until her husband was by her side.  Today, she is sitting up in bed eating breakfast, able to feed herself. She denies complaint at present.  Echo just done  EF 50-55%.  Inpatient Medications    . carbidopa-levodopa  2 tablet Oral QID  . carbidopa-levodopa  2 tablet Oral QHS  . carvedilol  3.125 mg Oral BID WC  . feeding supplement  1  Container Oral TID BM  . [START ON 04/15/2020] multivitamin with minerals  1 tablet Oral Daily  . phosphorus  500 mg Oral Q4H    Family History    Family History  Problem Relation Age of Onset  . Cancer Brother        colon  . Cancer Other        breast and ovarian cancers, relationships not listed  . Stroke Mother   . Osteoporosis Mother   . Depression Sister   . Depression Sister   . Depression Sister   . Cancer Other 54       niece  . Breast cancer Neg Hx    She indicated that her mother is deceased. She indicated that her father is deceased. She indicated  that three of her four sisters are alive. She indicated that her brother is alive. She indicated that the status of her neg hx is unknown. She indicated that one of her two others is deceased.  Social History    Social History   Socioeconomic History  . Marital status: Married    Spouse name: Not on file  . Number of children: Not on file  . Years of education: Not on file  . Highest education level: Not on file  Occupational History  . Not on file  Tobacco Use  . Smoking status: Never Smoker  . Smokeless tobacco: Never Used  Substance and Sexual Activity  . Alcohol use: No  . Drug use: No  . Sexual activity: Never  Other Topics Concern  . Not on file  Social History Narrative  . Not on file   Social Determinants of Health   Financial Resource Strain: Not on file  Food Insecurity: Not on file  Transportation Needs: Not on file  Physical Activity: Not on file  Stress: Not on file  Social Connections: Not on file  Intimate Partner Violence: Not on file     Review of Systems    General:  +++weight loss, anorexia, fatigue. No chills, fever, night sweats.  Cardiovascular:  No chest pain, dyspnea on exertion, edema, orthopnea, palpitations, paroxysmal nocturnal dyspnea. Dermatological: No rash, lesions/masses Respiratory: No cough, dyspnea Urologic: +++ frequent urination during the night. No hematuria,  dysuria Abdominal:  +++nausea, constipation. No bright red blood per rectum, melena, or hematemesis Neurologic:  +++weakness. No visual changes, changes in mental status. All other systems reviewed and are otherwise negative except as noted above.  Physical Exam    Blood pressure (!) 163/76, pulse 87, temperature 99.6 F (37.6 C), temperature source Oral, resp. rate 17, height '5\' 3"'  (1.6 m), weight 91.1 kg, SpO2 94 %.  General: Pleasant, NAD Psych: Normal affect. Neuro: Alert and oriented to person/place. Moves all extremities spontaneously. HEENT: Normal  Neck: Supple without bruits or JVD. Lungs:  Resp regular and unlabored, CTA. Heart: Tachycardia, RRR, 1/6 systolic murmur RUSB. No s3, s4.  Abdomen: Soft, non-tender, non-distended, BS + x 4.  Extremities: No clubbing, cyanosis. Non-pitting edema to calves bilaterally, tender to light palpation. DP/PT2+, Radials 2+ and equal bilaterally.  Labs    Cardiac Enzymes Recent Labs  Lab 04/13/20 1915 04/13/20 2316 04/14/20 0107 04/14/20 0247 04/14/20 0436  TROPONINIHS 61* 65* 65* 66* 53*      Lab Results  Component Value Date   WBC 11.1 (H) 04/14/2020   HGB 11.1 (L) 04/14/2020   HCT 34.8 (L) 04/14/2020   MCV 93.0 04/14/2020   PLT 168 04/14/2020    Recent Labs  Lab 04/14/20 0107 04/14/20 1114  NA 138  --   K 3.1* 3.6  CL 102  --   CO2 31  --   BUN 13  --   CREATININE 1.00  --   CALCIUM 9.6  --   PROT 5.9*  --   BILITOT 0.8  --   ALKPHOS 59  --   ALT <5  --   AST 12*  --   GLUCOSE 104*  --    Lab Results  Component Value Date   CHOL 218 (H) 07/03/2019   HDL 86 07/03/2019   LDLCALC 119 (H) 07/03/2019   TRIG 73 07/03/2019     Radiology Studies    DG Chest 2 View  Result Date: 04/13/2020 CLINICAL DATA:  Status post fall. EXAM:  CHEST - 2 VIEW COMPARISON:  Chest x-ray 04/01/2014 FINDINGS: The heart size and mediastinal contours are unchanged. Aortic arch calcifications. No focal consolidation. No pulmonary  edema. No pleural effusion. No pneumothorax. No acute osseous abnormality. IMPRESSION: No acute cardiopulmonary abnormality. Electronically Signed   By: Iven Finn M.D.   On: 04/13/2020 17:13   CT Head Wo Contrast  Result Date: 04/13/2020 CLINICAL DATA:  Limited history, patient poor histroian. Hx of breast cancer and Parkinson's. Per nurse note: Arrives by EMS s/p fall. Did not hit head, no LOC. EXAM: CT HEAD WITHOUT CONTRAST CT CERVICAL SPINE WITHOUT CONTRAST TECHNIQUE: Multidetector CT imaging of the head and cervical spine was performed following the standard protocol without intravenous contrast. Multiplanar CT image reconstructions of the cervical spine were also generated. COMPARISON:  MR head 05/06/2016 FINDINGS: CT HEAD FINDINGS Brain: Cerebral ventricle sizes are concordant with the degree of cerebral volume loss. No evidence of large-territorial acute infarction. No parenchymal hemorrhage. No mass lesion. No extra-axial collection. No mass effect or midline shift. No hydrocephalus. Basilar cisterns are patent. Vascular: No hyperdense vessel. Skull: No acute fracture or focal lesion. Sinuses/Orbits: Paranasal sinuses and mastoid air cells are clear. The orbits are unremarkable. Other: None. CT CERVICAL SPINE FINDINGS Alignment: Normal. Skull base and vertebrae: Diffusely decreased bone density. Multilevel mild degenerative changes of the spine. Partial fusion of the C5-C6 vertebral bodies. Mild to moderate intervertebral disc spaces at the C6-C7 level. No acute fracture. No aggressive appearing focal osseous lesion or focal pathologic process. Soft tissues and spinal canal: No prevertebral fluid or swelling. No visible canal hematoma. Upper chest: Unremarkable. Other: None. IMPRESSION: 1. No acute intracranial abnormality. 2. No acute displaced fracture or traumatic listhesis of the cervical spine. Electronically Signed   By: Iven Finn M.D.   On: 04/13/2020 18:19   CT Angio Chest PE W  and/or Wo Contrast  Result Date: 04/13/2020 CLINICAL DATA:  PE suspected, high probability. History of breast cancer. EXAM: CT ANGIOGRAPHY CHEST WITH CONTRAST TECHNIQUE: Multidetector CT imaging of the chest was performed using the standard protocol during bolus administration of intravenous contrast. Multiplanar CT image reconstructions and MIPs were obtained to evaluate the vascular anatomy. CONTRAST:  39m OMNIPAQUE IOHEXOL 350 MG/ML SOLN COMPARISON:  None. FINDINGS: Cardiovascular: No filling defects in the pulmonary arteries to suggest pulmonary emboli. Cardiomegaly. Coronary artery and aortic calcifications. No aneurysm. Mediastinum/Nodes: No mediastinal, hilar, or axillary adenopathy. Trachea and esophagus are unremarkable. Lungs/Pleura: Bibasilar atelectasis. No confluent opacities or effusions. Upper Abdomen: Imaging into the upper abdomen demonstrates no acute findings. Musculoskeletal: Chest wall soft tissues are unremarkable. No acute bony abnormality. Review of the MIP images confirms the above findings. IMPRESSION: No evidence of pulmonary embolus. Cardiomegaly, coronary artery disease. Bibasilar atelectasis. Aortic Atherosclerosis (ICD10-I70.0). Electronically Signed   By: KRolm BaptiseM.D.   On: 04/13/2020 18:19   CT Cervical Spine Wo Contrast  Result Date: 04/13/2020 CLINICAL DATA:  Limited history, patient poor histroian. Hx of breast cancer and Parkinson's. Per nurse note: Arrives by EMS s/p fall. Did not hit head, no LOC. EXAM: CT HEAD WITHOUT CONTRAST CT CERVICAL SPINE WITHOUT CONTRAST TECHNIQUE: Multidetector CT imaging of the head and cervical spine was performed following the standard protocol without intravenous contrast. Multiplanar CT image reconstructions of the cervical spine were also generated. COMPARISON:  MR head 05/06/2016 FINDINGS: CT HEAD FINDINGS Brain: Cerebral ventricle sizes are concordant with the degree of cerebral volume loss. No evidence of large-territorial acute  infarction. No parenchymal hemorrhage. No mass  lesion. No extra-axial collection. No mass effect or midline shift. No hydrocephalus. Basilar cisterns are patent. Vascular: No hyperdense vessel. Skull: No acute fracture or focal lesion. Sinuses/Orbits: Paranasal sinuses and mastoid air cells are clear. The orbits are unremarkable. Other: None. CT CERVICAL SPINE FINDINGS Alignment: Normal. Skull base and vertebrae: Diffusely decreased bone density. Multilevel mild degenerative changes of the spine. Partial fusion of the C5-C6 vertebral bodies. Mild to moderate intervertebral disc spaces at the C6-C7 level. No acute fracture. No aggressive appearing focal osseous lesion or focal pathologic process. Soft tissues and spinal canal: No prevertebral fluid or swelling. No visible canal hematoma. Upper chest: Unremarkable. Other: None. IMPRESSION: 1. No acute intracranial abnormality. 2. No acute displaced fracture or traumatic listhesis of the cervical spine. Electronically Signed   By: Iven Finn M.D.   On: 04/13/2020 18:19   CT ABDOMEN PELVIS W CONTRAST  Result Date: 04/13/2020 CLINICAL DATA:  Fall, history breast cancer, Parkinson's disease EXAM: CT ABDOMEN AND PELVIS WITH CONTRAST TECHNIQUE: Multidetector CT imaging of the abdomen and pelvis was performed using the standard protocol following bolus administration of intravenous contrast. Sagittal and coronal MPR images reconstructed from axial data set. CONTRAST:  71m OMNIPAQUE IOHEXOL 350 MG/ML SOLN IV. No oral contrast. COMPARISON:  06/01/2010 FINDINGS: Lower chest: Mild bibasilar atelectasis Hepatobiliary: Focal fatty infiltration of liver adjacent to falciform fissure. Liver and gallbladder otherwise normal appearance Pancreas: Atrophic without mass Spleen: Normal appearance Adrenals/Urinary Tract: Small BILATERAL nonobstructing renal calculi. Small peripelvic LEFT renal cysts. Adrenal glands, kidneys, ureters, and bladder otherwise normal appearance  Stomach/Bowel: Bowel anastomosis in pelvis, ileocolic. Prior subtotal colectomy with residual rectum and short segment of sigmoid colon. Remaining bowel loops unremarkable. Stomach normal appearance. Vascular/Lymphatic: Retroaortic LEFT renal vein. Aorta normal caliber. Mild atherosclerotic calcifications aorta. No adenopathy. Reproductive: Uterus surgically absent.  Probable atrophic ovaries. Other: Small umbilical hernia containing fat. No free air or free fluid. No inflammatory process. Musculoskeletal: Osseous demineralization. RIGHT hip prosthesis. Subacute to old fractures of the LEFT transverse processes of L1 and L2. Old fracture posterior LEFT twelfth rib. Superior endplate compression fracture of L2 with mild anterior height loss, appears old. IMPRESSION: Prior subtotal colectomy. Small umbilical hernia containing fat. Nonobstructing renal calculi. Small BILATERAL nonobstructing renal calculi and small peripelvic LEFT renal cysts. Subacute to old fractures of the LEFT twelfth rib, LEFT transverse processes of L1 and L2 and superior endplate compression fracture of L2. No acute intra-abdominal or intrapelvic abnormalities. Aortic Atherosclerosis (ICD10-I70.0). Electronically Signed   By: MLavonia DanaM.D.   On: 04/13/2020 18:21   [place]oreaCarotid Bilateral  Result Date: 04/14/2020 CLINICAL DATA:  75year old female with history of syncope. EXAM: BILATERAL CAROTID DUPLEX ULTRASOUND TECHNIQUE: GPearline Cablesscale imaging, color Doppler and duplex ultrasound were performed of bilateral carotid and vertebral arteries in the neck. COMPARISON:  None. FINDINGS: Criteria: Quantification of carotid stenosis is based on velocity parameters that correlate the residual internal carotid diameter with NASCET-based stenosis levels, using the diameter of the distal internal carotid lumen as the denominator for stenosis measurement. The following velocity measurements were obtained: RIGHT ICA: Peak systolic velocity 1209cm/sec, End  diastolic velocity 31 cm/sec CCA: Peak systolic velocity 71 cm/sec SYSTOLIC ICA/CCA RATIO:  1.6 ECA: Peak systolic velocity 96 cm/sec LEFT ICA: Peak systolic velocity 98 cm/sec, End diastolic velocity 29 cm/sec CCA: 81 cm/sec SYSTOLIC ICA/CCA RATIO:  1.2 ECA: 80 cm/sec RIGHT CAROTID ARTERY: Mild focal atherosclerotic plaque formation about the proximal ICA. No significant tortuosity. Normal low resistance waveforms. RIGHT VERTEBRAL  ARTERY:  Antegrade flow. LEFT CAROTID ARTERY: Mild multifocal atherosclerotic plaque formation about the carotid bulb and bifurcation. No significant tortuosity. Normal low resistance waveforms. LEFT VERTEBRAL ARTERY:  Antegrade flow. Upper extremity non-invasive blood pressures: Not obtained. IMPRESSION: 1. Right carotid artery system: Less than 50% stenosis secondary to mild atherosclerotic plaque formation. 2. Left carotid artery system: Less than 50% stenosis secondary to mild atherosclerotic plaque formation. 3.  Vertebral artery system: Patent with antegrade flow bilaterally. Ruthann Cancer, MD [facility] Radiology Electronically Signed   By: Ruthann Cancer MD   On: 04/14/2020 14:01   ECG & Cardiac Imaging    Regular Sinus rhythm @ 97, no ST/T wave abnormalities - personally reviewed.  Assessment & Plan    1. Elevated Hs Troponin/Demand ischemia: Admitted 3/21 w/ recurrent falls and presyncope/syncope.  HsTrop mildly elevated w/ flat trend and peak of 66.  No evidence of ACS or h/o chest pain. She does have chronic DOE in the setting of chronic deconditioning, but no recent change in activity tolerance or palpitations.  ECG reveals ST, no ST/T wave changes. Echo w/ low-nl LV fxn @ 50-55%.  With limited mobility and body habitus, she is not an ideal candidate for noninvasive or invasive testing at this time.  Recommend medical therapy and can reconsider outpt stress testing in the future.  Cont  blocker.  Will add ASA and  statin.  2. Sinus tachycardia: HR remains elevated this morning. She is receiving IV hydration and regular diet. I/Os show +1L; dark yellow urine output noted in her bedside cannister. Hx CKD with elevated creatinine this admission. Kidney fxn currently stable. No sign of urine or respiratory infection. TSH nl. Hgb 11.1, stable.  She is on low dose  blocker, first dose @ 1700 yesterday. No evidence of PE on CTA chest.  HR >85 on multiple recent outpatient office notes, has remained elevated since arrival. Echo w/ low-nl EF.  Suspect HRs may be driven by pain and dehydration/poor nutrition.  3. Fall/pre-syncope: She reports that she had a feeling that "the lights are getting dim" prior to fall on 3/21.  She denies recent change in activity tolerance although she is having more occurrences of lightheadedness/leg weakness with activity over the past few months per husband who is at bedside.  They both provide a strong history of orthostatic symptoms and frequent presyncope when walking in her home.  On carbidopa/levodopa for Parkinson's. At recent neurology appointment, Dr. Sunday Corn felt that symptoms were related to progression of Parkinson's. Reports her medications make her want to sleep all the time and that food has lost its taste. Reports recent weight loss of >20 lbs. BP this AM 171/86. Decreased oral intake/dehydration may be contributing. Orthostatics have been ordered but not yet completed; she may have difficulty standing to complete the vitals because she has discomfort with sitting up. Carotid ultrasound w/ bilat < 50% ICA stenoses.  Echo w/ low-nl EF, making ventricular arrhythmias less likely.  Follow tele.  If not findings/symptoms during hospitalization, will consider 14 day zio.     4. CKD Stage 3a: Creatinine up from baseline this admission, currently stable. Avoid nephrotoxic agents. Not on ACEi/ARB. On IV hydration, encourage po intake per IM. Will continue to monitor.   5. Hypokalemia: K+ 3.1  today @ 0107. She received potassium chloride 10 mEq infusions x 4 on 3/21. Will recheck BMET today and continue to follow. Magnesium is low, will need replacement.   6. Hypomagnesemia: Mag 1.6 today,  needs replacement - will order 2g this AM. Will continue to follow.   Signed, Murray Hodgkins, NP 04/14/2020, 4:36 PM  For questions or updates, please contact   Please consult [email or web address] for contact info under Cardiology/STEMI.

## 2020-04-14 NOTE — Plan of Care (Signed)
  Problem: Clinical Measurements: Goal: Will remain free from infection Outcome: Progressing Goal: Respiratory complications will improve Outcome: Progressing Goal: Cardiovascular complication will be avoided Outcome: Progressing   

## 2020-04-14 NOTE — Progress Notes (Addendum)
PROGRESS NOTE    Terri Perez   DGL:[id number]  DOB: [birth year]  DOA: 04/13/2020 PCP: Jerrol Banana., MD   Brief Narrative:  Terri Perez is a 75 year old female who lives with her husband and has a medical h/o  CKD 3B, anxiety, Parkinson's disease masked facies, obesity, chronic constipation, rheumatoid arthritis with positive rheumatoid factor, fibromyalgia, osteoporosis with multiple fractures, breast cancer in remission who presents to the hospital after a fall. The patient told the ED doctor that she has been having problems with constipation and had taken some laxatives and was able to have a bowel movement.  Has had poor oral intake.  Has been anxious and taking her Xanax as well.  She thinks she has lost 15 pounds in the past week.  After her bowel movement, she later got up and will was walking into the living room and "does not remember what happened but had a fall".  She woke up with EMS.  She was told that her sugar was normal and her vital signs were normal.  She appeared to be confused in the ED.  She was admitted for syncope, AKI and abnormal electrolytes.  Troponin was found to be elevated at 25 followed by 61.   Tachycardic in the 120s.  Initial BP 139/163.  Given his Xanax for anxiety.  No orthostatic vitals. Potassium 2.7 Magnesium 1.8 Phosphorus 1.7 Creatinine 1.45-baseline is 0.90 EKG revealed a normal sinus rhythm Urine drug screen was positive for benzodiazepine UA revealed no bacteria, small leukocytes and 21-50 WBCs with 6-10 squamous cells. TSH was 0.652  Given 1 L of normal saline in the ED and then started on 75 cc an hour.  Potassium and phosphate was replaced.  Subjective: She has no new complaints. Felt she was very dehydrated yesterday. Trying to eat breakfast and most of it is untouched.     Assessment & Plan:   Principal Problem:   Syncope and collapse  -Possibly related to electrolyte disturbances in the setting of dehydration/  orthostasis, AKI and poor oral intake -Troponin max 66-53 today -Magnesium level 1.6 today, potassium 3.1 today, phosphorus 3.2 today  Active Problems: 15 lb weight loss, dehydration- dysphagia?  - SLP eval is suspicious for esophageal stricture as she is having to swallow multiple times even with pureed food - Pureed diet recommended with meds crushed in pureed for now - esophagram ordered - hydrating with IVF- resume NS at 75 cc/hr and follow I and O - have asked RNs to document % of food eaten  Elevated Troponin - suspect secondary to dehydration and abnormal electrolytes and not an acute NSTEMI - 2 D ECHO shows grade 1 d CHF and no WMA  Acute encephalopathy - confusion in ED-  - seems a bit confused today to events at home- follow- ? If she is developing parkinson's dementia - check B12- it was 207 in 1/22 at [facility]  Electrolyte abnormalities  Replacing K and Phos  Acute anxiety - use Xanax sparing- consider switching to Buspar which is non habit forming    Parkinson disease  - cont Sinemet- 2 tabs  5 x day   Rh arthritis - has been old she has it in her hip   Time spent in minutes: 35 DVT prophylaxis: SCDs Start: 04/13/20 2213  Code Status: DNR Family Communication:  Level of Care: Level of care: Progressive Cardiac Disposition Plan:  Status is: Observation  The patient will require care spanning > 2 midnights and should be moved  to inpatient because: Inpatient level of care appropriate due to severity of illness  Dispo: The patient is from: Home              Anticipated d/c is to: Home              Patient currently is not medically stable to d/c.   Difficult to place patient No      Consultants:   none Procedures:   none Antimicrobials:  Anti-infectives (From admission, onward)   None       Objective: Vitals:   04/13/20 2210 04/13/20 2319 04/14/20 0315 04/14/20 0357  BP: (!) 175/86 (!) 152/77 (!) 171/86   Pulse: (!) 106 91 99   Resp: 18 16  17    Temp: 98.2 F (36.8 C) 98.6 F (37 C) 98.8 F (37.1 C)   TempSrc: Oral     SpO2: 96% 94% 94%   Weight:    91.1 kg  Height:        Intake/Output Summary (Last 24 hours) at 04/14/2020 0826 Last data filed at 04/14/2020 0644 Gross per 24 hour  Intake 1578.21 ml  Output 550 ml  Net 1028.21 ml   Filed Weights   04/13/20 1554 04/14/20 0357  Weight: 92.5 kg 91.1 kg    Examination: General exam: Appears comfortable  HEENT: PERRLA, oral mucosa moist, no sclera icterus or thrush Respiratory system: Clear to auscultation. Respiratory effort normal. Cardiovascular system: S1 & S2 heard, RRR.   Gastrointestinal system: Abdomen soft, non-tender, nondistended. Normal bowel sounds. Central nervous system: Alert and oriented. No focal neurological deficits. Extremities: No cyanosis, clubbing or edema Skin: No rashes or ulcers Psychiatry:  Mood & affect appropriate.     Data Reviewed: I have personally reviewed following labs and imaging studies  CBC: Recent Labs  Lab 04/13/20 1613 04/14/20 0107  WBC 8.6 11.1*  NEUTROABS 6.7 8.7*  HGB 13.2 11.1*  HCT 39.4 34.8*  MCV 91.0 93.0  PLT 197 213   Basic Metabolic Panel: Recent Labs  Lab 04/13/20 1613 04/13/20 1618 04/13/20 2316 04/14/20 0107  NA 135  --  137 138  K 2.7*  --  2.9* 3.1*  CL 95*  --  101 102  CO2 29  --  28 31  GLUCOSE 142*  --  114* 104*  BUN 16  --  13 13  CREATININE 1.45*  --  1.08* 1.00  CALCIUM 11.1*  --  9.7 9.6  MG 1.8  --   --  1.6*  PHOS  --  1.7*  --  2.2*   GFR: Estimated Creatinine Clearance: 52.1 mL/min (by C-G formula based on SCr of 1 mg/dL). Liver Function Tests: Recent Labs  Lab 04/13/20 1613 04/14/20 0107  AST 15 12*  ALT 6 <5  ALKPHOS 75 59  BILITOT 1.0 0.8  PROT 7.4 5.9*  ALBUMIN 3.9 3.2*   No results for input(s): LIPASE, AMYLASE in the last 168 hours. Recent Labs  Lab 04/13/20 1939  AMMONIA 10   Coagulation Profile: Recent Labs  Lab 04/13/20 1613  INR 1.0    Cardiac Enzymes: Recent Labs  Lab 04/13/20 1915  CKTOTAL 81   BNP (last 3 results) No results for input(s): PROBNP in the last 8760 hours. HbA1C: No results for input(s): HGBA1C in the last 72 hours. CBG: No results for input(s): GLUCAP in the last 168 hours. Lipid Profile: No results for input(s): CHOL, HDL, LDLCALC, TRIG, CHOLHDL, LDLDIRECT in the last 72 hours. Thyroid Function Tests: Recent  Labs    04/14/20 0107  TSH 0.652   Anemia Panel: No results for input(s): VITAMINB12, FOLATE, FERRITIN, TIBC, IRON, RETICCTPCT in the last 72 hours. Urine analysis:    Component Value Date/Time   COLORURINE YELLOW (A) 04/13/2020 2245   APPEARANCEUR CLOUDY (A) 04/13/2020 2245   APPEARANCEUR Clear 02/26/2014 0246   LABSPEC 1.028 04/13/2020 2245   LABSPEC 1.025 02/26/2014 0246   PHURINE 8.0 04/13/2020 2245   GLUCOSEU NEGATIVE 04/13/2020 2245   GLUCOSEU Negative 02/26/2014 0246   HGBUR NEGATIVE 04/13/2020 2245   BILIRUBINUR NEGATIVE 04/13/2020 2245   BILIRUBINUR Negative 02/26/2014 0246   KETONESUR NEGATIVE 04/13/2020 2245   PROTEINUR NEGATIVE 04/13/2020 2245   NITRITE NEGATIVE 04/13/2020 2245   LEUKOCYTESUR SMALL (A) 04/13/2020 2245   LEUKOCYTESUR Negative 02/26/2014 0246   Sepsis Labs: @LABRCNTIP (procalcitonin:4,lacticidven:4) ) Recent Results (from the past 240 hour(s))  Resp Panel by RT-PCR (Flu A&B, Covid) Nasopharyngeal Swab     Status: None   Collection Time: 04/13/20  4:10 PM   Specimen: Nasopharyngeal Swab; Nasopharyngeal(NP) swabs in vial transport medium  Result Value Ref Range Status   SARS Coronavirus 2 by RT PCR NEGATIVE NEGATIVE Final    Comment: (NOTE) SARS-CoV-2 target nucleic acids are NOT DETECTED.  The SARS-CoV-2 RNA is generally detectable in upper respiratory specimens during the acute phase of infection. The lowest concentration of SARS-CoV-2 viral copies this assay can detect is 138 copies/mL. A negative result does not preclude  SARS-Cov-2 infection and should not be used as the sole basis for treatment or other patient management decisions. A negative result may occur with  improper specimen collection/handling, submission of specimen other than nasopharyngeal swab, presence of viral mutation(s) within the areas targeted by this assay, and inadequate number of viral copies(<138 copies/mL). A negative result must be combined with clinical observations, patient history, and epidemiological information. The expected result is Negative.  Fact Sheet for Patients:  EntrepreneurPulse.com.au  Fact Sheet for Healthcare Providers:  IncredibleEmployment.be  This test is no t yet approved or cleared by the [place] FDA and  has been authorized for detection and/or diagnosis of SARS-CoV-2 by FDA under an Emergency Use Authorization (EUA). This EUA will remain  in effect (meaning this test can be used) for the duration of the COVID-19 declaration under Section 564(b)(1) of the Act, 21 U.S.C.section 360bbb-3(b)(1), unless the authorization is terminated  or revoked sooner.       Influenza A by PCR NEGATIVE NEGATIVE Final   Influenza B by PCR NEGATIVE NEGATIVE Final    Comment: (NOTE) The Xpert Xpress SARS-CoV-2/FLU/RSV plus assay is intended as an aid in the diagnosis of influenza from Nasopharyngeal swab specimens and should not be used as a sole basis for treatment. Nasal washings and aspirates are unacceptable for Xpert Xpress SARS-CoV-2/FLU/RSV testing.  Fact Sheet for Patients: EntrepreneurPulse.com.au  Fact Sheet for Healthcare Providers: IncredibleEmployment.be  This test is not yet approved or cleared by the [place] FDA and has been authorized for detection and/or diagnosis of SARS-CoV-2 by FDA under an Emergency Use Authorization (EUA). This EUA will remain in effect (meaning this test can be used) for the duration of  the COVID-19 declaration under Section 564(b)(1) of the Act, 21 U.S.C. section 360bbb-3(b)(1), unless the authorization is terminated or revoked.  Performed at [facility], [address], [place], [place] [address]          Radiology Studies: DG Chest 2 View  Result Date: 04/13/2020 CLINICAL DATA:  Status post fall. EXAM: CHEST -  2 VIEW COMPARISON:  Chest x-ray 04/01/2014 FINDINGS: The heart size and mediastinal contours are unchanged. Aortic arch calcifications. No focal consolidation. No pulmonary edema. No pleural effusion. No pneumothorax. No acute osseous abnormality. IMPRESSION: No acute cardiopulmonary abnormality. Electronically Signed   By: Iven Finn M.D.   On: 04/13/2020 17:13   CT Head Wo Contrast  Result Date: 04/13/2020 CLINICAL DATA:  Limited history, patient poor histroian. Hx of breast cancer and Parkinson's. Per nurse note: Arrives by EMS s/p fall. Did not hit head, no LOC. EXAM: CT HEAD WITHOUT CONTRAST CT CERVICAL SPINE WITHOUT CONTRAST TECHNIQUE: Multidetector CT imaging of the head and cervical spine was performed following the standard protocol without intravenous contrast. Multiplanar CT image reconstructions of the cervical spine were also generated. COMPARISON:  MR head 05/06/2016 FINDINGS: CT HEAD FINDINGS Brain: Cerebral ventricle sizes are concordant with the degree of cerebral volume loss. No evidence of large-territorial acute infarction. No parenchymal hemorrhage. No mass lesion. No extra-axial collection. No mass effect or midline shift. No hydrocephalus. Basilar cisterns are patent. Vascular: No hyperdense vessel. Skull: No acute fracture or focal lesion. Sinuses/Orbits: Paranasal sinuses and mastoid air cells are clear. The orbits are unremarkable. Other: None. CT CERVICAL SPINE FINDINGS Alignment: Normal. Skull base and vertebrae: Diffusely decreased bone density. Multilevel mild degenerative changes of the spine. Partial fusion of the C5-C6  vertebral bodies. Mild to moderate intervertebral disc spaces at the C6-C7 level. No acute fracture. No aggressive appearing focal osseous lesion or focal pathologic process. Soft tissues and spinal canal: No prevertebral fluid or swelling. No visible canal hematoma. Upper chest: Unremarkable. Other: None. IMPRESSION: 1. No acute intracranial abnormality. 2. No acute displaced fracture or traumatic listhesis of the cervical spine. Electronically Signed   By: Iven Finn M.D.   On: 04/13/2020 18:19   CT Angio Chest PE W and/or Wo Contrast  Result Date: 04/13/2020 CLINICAL DATA:  PE suspected, high probability. History of breast cancer. EXAM: CT ANGIOGRAPHY CHEST WITH CONTRAST TECHNIQUE: Multidetector CT imaging of the chest was performed using the standard protocol during bolus administration of intravenous contrast. Multiplanar CT image reconstructions and MIPs were obtained to evaluate the vascular anatomy. CONTRAST:  43mL OMNIPAQUE IOHEXOL 350 MG/ML SOLN COMPARISON:  None. FINDINGS: Cardiovascular: No filling defects in the pulmonary arteries to suggest pulmonary emboli. Cardiomegaly. Coronary artery and aortic calcifications. No aneurysm. Mediastinum/Nodes: No mediastinal, hilar, or axillary adenopathy. Trachea and esophagus are unremarkable. Lungs/Pleura: Bibasilar atelectasis. No confluent opacities or effusions. Upper Abdomen: Imaging into the upper abdomen demonstrates no acute findings. Musculoskeletal: Chest wall soft tissues are unremarkable. No acute bony abnormality. Review of the MIP images confirms the above findings. IMPRESSION: No evidence of pulmonary embolus. Cardiomegaly, coronary artery disease. Bibasilar atelectasis. Aortic Atherosclerosis (ICD10-I70.0). Electronically Signed   By: Rolm Baptise M.D.   On: 04/13/2020 18:19   CT Cervical Spine Wo Contrast  Result Date: 04/13/2020 CLINICAL DATA:  Limited history, patient poor histroian. Hx of breast cancer and Parkinson's. Per nurse  note: Arrives by EMS s/p fall. Did not hit head, no LOC. EXAM: CT HEAD WITHOUT CONTRAST CT CERVICAL SPINE WITHOUT CONTRAST TECHNIQUE: Multidetector CT imaging of the head and cervical spine was performed following the standard protocol without intravenous contrast. Multiplanar CT image reconstructions of the cervical spine were also generated. COMPARISON:  MR head 05/06/2016 FINDINGS: CT HEAD FINDINGS Brain: Cerebral ventricle sizes are concordant with the degree of cerebral volume loss. No evidence of large-territorial acute infarction. No parenchymal hemorrhage. No mass lesion. No extra-axial  collection. No mass effect or midline shift. No hydrocephalus. Basilar cisterns are patent. Vascular: No hyperdense vessel. Skull: No acute fracture or focal lesion. Sinuses/Orbits: Paranasal sinuses and mastoid air cells are clear. The orbits are unremarkable. Other: None. CT CERVICAL SPINE FINDINGS Alignment: Normal. Skull base and vertebrae: Diffusely decreased bone density. Multilevel mild degenerative changes of the spine. Partial fusion of the C5-C6 vertebral bodies. Mild to moderate intervertebral disc spaces at the C6-C7 level. No acute fracture. No aggressive appearing focal osseous lesion or focal pathologic process. Soft tissues and spinal canal: No prevertebral fluid or swelling. No visible canal hematoma. Upper chest: Unremarkable. Other: None. IMPRESSION: 1. No acute intracranial abnormality. 2. No acute displaced fracture or traumatic listhesis of the cervical spine. Electronically Signed   By: Iven Finn M.D.   On: 04/13/2020 18:19   CT ABDOMEN PELVIS W CONTRAST  Result Date: 04/13/2020 CLINICAL DATA:  Fall, history breast cancer, Parkinson's disease EXAM: CT ABDOMEN AND PELVIS WITH CONTRAST TECHNIQUE: Multidetector CT imaging of the abdomen and pelvis was performed using the standard protocol following bolus administration of intravenous contrast. Sagittal and coronal MPR images reconstructed from  axial data set. CONTRAST:  13mL OMNIPAQUE IOHEXOL 350 MG/ML SOLN IV. No oral contrast. COMPARISON:  06/01/2010 FINDINGS: Lower chest: Mild bibasilar atelectasis Hepatobiliary: Focal fatty infiltration of liver adjacent to falciform fissure. Liver and gallbladder otherwise normal appearance Pancreas: Atrophic without mass Spleen: Normal appearance Adrenals/Urinary Tract: Small BILATERAL nonobstructing renal calculi. Small peripelvic LEFT renal cysts. Adrenal glands, kidneys, ureters, and bladder otherwise normal appearance Stomach/Bowel: Bowel anastomosis in pelvis, ileocolic. Prior subtotal colectomy with residual rectum and short segment of sigmoid colon. Remaining bowel loops unremarkable. Stomach normal appearance. Vascular/Lymphatic: Retroaortic LEFT renal vein. Aorta normal caliber. Mild atherosclerotic calcifications aorta. No adenopathy. Reproductive: Uterus surgically absent.  Probable atrophic ovaries. Other: Small umbilical hernia containing fat. No free air or free fluid. No inflammatory process. Musculoskeletal: Osseous demineralization. RIGHT hip prosthesis. Subacute to old fractures of the LEFT transverse processes of L1 and L2. Old fracture posterior LEFT twelfth rib. Superior endplate compression fracture of L2 with mild anterior height loss, appears old. IMPRESSION: Prior subtotal colectomy. Small umbilical hernia containing fat. Nonobstructing renal calculi. Small BILATERAL nonobstructing renal calculi and small peripelvic LEFT renal cysts. Subacute to old fractures of the LEFT twelfth rib, LEFT transverse processes of L1 and L2 and superior endplate compression fracture of L2. No acute intra-abdominal or intrapelvic abnormalities. Aortic Atherosclerosis (ICD10-I70.0). Electronically Signed   By: Lavonia Dana M.D.   On: 04/13/2020 18:21      Scheduled Meds: . carbidopa-levodopa  2 tablet Oral QID  . carvedilol  3.125 mg Oral BID WC   Continuous Infusions: . sodium chloride 75 mL/hr  (04/14/20 0644)     LOS: 0 days      Debbe Odea, MD [facility] Pager: [email or web address] 04/14/2020, 8:26 AM

## 2020-04-14 NOTE — Progress Notes (Signed)
*  PRELIMINARY RESULTS* Echocardiogram 2D Echocardiogram has been performed.  Terri Perez 04/14/2020, 12:13 PM

## 2020-04-14 NOTE — Progress Notes (Signed)
OT Cancellation Note  Patient Details Name: MARITSSA HAUGHTON MRN: [id number] DOB: 25-May-1945   Cancelled Treatment:    Reason Eval/Treat Not Completed: Patient declined, no reason specified. OT order received and chart reviewed. RN present in room taking vitals and husband present as well. OT explaining therapy services to pt and caregiver with pt declining OT intervention at this time. Pt reports, " Please go away. I will talk to you later. I need to eat something and get some rest". OT to re-attempt at next available time.   Darleen Crocker, MS, OTR/L , CBIS ascom [phone]  04/14/20, 9:47 AM   04/14/2020, 9:45 AM

## 2020-04-14 NOTE — Progress Notes (Signed)
Initial Nutrition Assessment  DOCUMENTATION CODES:   Obesity unspecified  INTERVENTION:   Boost Breeze po TID, each supplement provides 250 kcal and 9 grams of protein  MVI po daily   Pt at high refeed risk; recommend monitor potassium, magnesium and phosphorus labs daily until stable  NUTRITION DIAGNOSIS:   Increased nutrient needs related to chronic illness (breast cancer, parkinson's disease) as evidenced by estimated needs.  GOAL:   Patient will meet greater than or equal to 90% of their needs  MONITOR:   PO intake,Supplement acceptance,Labs,Weight trends,Skin,I & O's  REASON FOR ASSESSMENT:   Consult Assessment of nutrition requirement/status  ASSESSMENT:   75 y.o. female with medical history significant of  Parkinson's disease, breast cancer, constipation, CKD stage 3, GERD, and MDD who is admitted for dehydration with possible syncope and hypokalemia   Met with pt and pt's husband in room today. Pt is a poor historian so majority of history was obtained from husband at bedside. Pt's main concern today was when she would be going down for her modified barium study. Pt reports difficulty swallowing and food getting stuck in her throat that has progressively been getting worse over the past year. Pt reports that she has mainly been able to tolerate liquids and some soft foods. Per chart, pt is down 24lbs(11%) over the past 5 months; this is significant. Pt was seen by SLP today who recommend MBSS. Pt reports that she does not like Boost or Ensure. Pt also reports lactose intolerance however husband at bedside reports that pt eats cheese and dairy at home. 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 Husband also reports that patient eats bread and "most of anything she wants" at home; unsure if this is a true gluten allergy. RD will add supplements and MVI to help pt meet her estimated needs. Pt is likely at refeed risk.    Medications reviewed and include: KPhos,  NaCl $Rem'@75ml'urkt$ /hr  Labs reviewed: K 3.6 wnl, P 2.2(L), Mg 1.6(L) Wbc- 11.1(H), Hct 34.8(L)  NUTRITION - FOCUSED PHYSICAL EXAM:  Flowsheet Row Most Recent Value  Orbital Region No depletion  Upper Arm Region No depletion  Thoracic and Lumbar Region No depletion  Buccal Region No depletion  Temple Region No depletion  Clavicle Bone Region No depletion  Clavicle and Acromion Bone Region No depletion  Scapular Bone Region No depletion  Dorsal Hand No depletion  Patellar Region No depletion  Anterior Thigh Region No depletion  Posterior Calf Region No depletion  Edema (RD Assessment) None  Hair Reviewed  Eyes Reviewed  Mouth Reviewed  Skin Reviewed  Nails Reviewed     Diet Order:   Diet Order            DIET - DYS 1 Room service appropriate? Yes; Fluid consistency: Thin  Diet effective now                EDUCATION NEEDS:   Education needs have been addressed  Skin:  Skin Assessment: Reviewed RN Assessment  Last BM:  pta  Height:   Ht Readings from Last 1 Encounters:  04/13/20 $RemoveB'5\' 3"'eqKhzSCY$  (1.6 m)    Weight:   Wt Readings from Last 1 Encounters:  04/14/20 91.1 kg    Ideal Body Weight:  52.3 kg  BMI:  Body mass index is 35.59 kg/m.  Estimated Nutritional Needs:   Kcal:  1700-2000kcal/day  Protein:  85-100g/day  Fluid:  1.6L/day  Terri Distance MS, RD, LDN Please refer to [facility] for  RD and/or RD on-call/weekend/after hours pager  

## 2020-04-14 NOTE — Evaluation (Signed)
Occupational Therapy Evaluation Patient Details Name: Terri Perez MRN: [id number] DOB: Aug 13, 1945 Today's Date: 04/14/2020    History of Present Illness Terri Perez is a 75 y.o. female with medical history significant of  Parkinson's disease, breast cancer, constipation, CKD stage3, Jerrye Bushy.   Clinical Impression   Patient presenting with decreased I in self care, balance, functional mobility/transfers, endurance, and safety awareness Patient reports being independent at home. Pt is husband present in room reports he is her caregiver. Pt sleeps in lift chair. She has hospital bed that she does not sleep in. Pt ambulates short distances with RW and wheelchair follow from husband secondary to increase in falls. Pt needing mod A for self care tasks at baseline. Pt sits in shower chair to wash but needs husband to dress her when done. This session pt very self limiting with therapist making 2 attempts to see her. Pt begins crying when therapist gets nearby and asks her any questions or asks her to move. Pt hyper focused on repositioning in bed with therapist assistance. Caregiver reports they have tried home health and SNF rehab but she does not do well. Pt will be on caseload for a trial basis of 2 sessions. If pt does not progress towards goals/participate she will be discharged from caseload. OT reviewed this plan with pt and husband who is not aggreeable.  Patient will benefit from acute OT to increase overall independence in the areas of ADLs, functional mobility, and safety awareness in order to safely discharge home with caregiver.    Follow Up Recommendations  Home health OT;Supervision/Assistance - 24 hour    Equipment Recommendations  None recommended by OT       Precautions / Restrictions Precautions Precautions: Fall      Mobility Bed Mobility Overal bed mobility: Needs Assistance Bed Mobility: Rolling Rolling: Mod assist         General bed mobility comments:  repositioning in bed    Transfers                 General transfer comment: Pt refusal        ADL either performed or assessed with clinical judgement   ADL Overall ADL's : Needs assistance/impaired     Grooming: Wash/dry hands;Wash/dry face;Bed level;Set up             General ADL Comments: Pt is likely at baseline but refusal to demonstrate these activities during session.     Vision Patient Visual Report: No change from baseline              Pertinent Vitals/Pain Pain Assessment: Faces Faces Pain Scale: Hurts even more Pain Descriptors / Indicators: Aching;Discomfort;Guarding Pain Intervention(s): Limited activity within patient's tolerance;Repositioned     Hand Dominance Right   Extremity/Trunk Assessment Upper Extremity Assessment Upper Extremity Assessment: Generalized weakness   Lower Extremity Assessment Lower Extremity Assessment: Generalized weakness          Cognition Arousal/Alertness: Awake/alert Behavior During Therapy: Restless;Anxious Overall Cognitive Status: History of cognitive impairments - at baseline                                 General Comments: Pt tearful and upset when therapist asking her to answer questions or demonstrate any activity. Pt giving incorrect information related to home set up and PLOF per husband present in room.              Home  Living Family/patient expects to be discharged to:: Private residence Living Arrangements: Spouse/significant other Available Help at Discharge: Available 24 hours/day Type of Home: House Home Access: [place]: One level     Bathroom Shower/Tub: [place]: Environmental consultant - 2 wheels;Bedside commode;Shower seat;Wheelchair - manual;Hand held shower head;Hospital bed   Additional Comments: Pt sleeps in lift chair per husband report.      Prior Functioning/Environment Level of Independence: Needs assistance   Gait / Transfers Assistance Needed: Pt ambulates with RW and husband providing wheelchair follow secondary to multiple falls at homes. Use of wheelchair to get pt out of the home and to car. ADL's / Homemaking Assistance Needed: Pt sits in shower seat with husband assisting. Pt needing mod - max A for dressing and toileting needs.   Comments: Pt is not  reliable historian. Husband present during this evaluation.        OT Problem List: Decreased strength;Impaired balance (sitting and/or standing);Decreased cognition;Pain;Decreased activity tolerance;Decreased knowledge of use of DME or AE;Cardiopulmonary status limiting activity;Decreased safety awareness      OT Treatment/Interventions: Self-care/ADL training;Therapeutic exercise;Energy conservation;DME and/or AE instruction;Manual therapy;Therapeutic activities;Cognitive remediation/compensation;Modalities;Patient/family education;Balance training    OT Goals(Current goals can be found in the care plan section) Acute Rehab OT Goals Patient Stated Goal: I need more help with her at home OT Goal Formulation: With family Time For Goal Achievement: 04/28/20 Potential to Achieve Goals: Fair ADL Goals Pt Will Perform Grooming: with supervision;standing Pt Will Transfer to Toilet: with supervision;ambulating Pt Will Perform Toileting - Clothing Manipulation and hygiene: with min assist;sit to/from stand Pt/caregiver will Perform Home Exercise Program: With written HEP provided;With Supervision;With theraband;Both right and left upper extremity;Increased strength  OT Frequency: Min 1X/week   Barriers to D/C:    none known at this time          AM-PAC OT "6 Clicks" Daily Activity     Outcome Measure Help from another person eating meals?: A Little Help from another person taking care of personal grooming?: A Little Help from another person toileting, which includes using toliet, bedpan, or urinal?: A Lot Help from another person  bathing (including washing, rinsing, drying)?: A Lot Help from another person to put on and taking off regular upper body clothing?: A Little Help from another person to put on and taking off regular lower body clothing?: A Lot 6 Click Score: 15   End of Session Nurse Communication: Mobility status;Precautions  Activity Tolerance: Other (comment) (Pt very self limiting) Patient left: in bed;with call bell/phone within reach;Other (comment);with family/visitor present (transport present)  OT Visit Diagnosis: Unsteadiness on feet (R26.81);Repeated falls (R29.6);Muscle weakness (generalized) (M62.81);History of falling (Z91.81)                Time: 1335-1411 OT Time Calculation (min): 36 min Charges:  OT General Charges $OT Visit: 1 Visit OT Evaluation $OT Eval Moderate Complexity: 1 Mod OT Treatments $Therapeutic Activity: 23-37 mins  Darleen Crocker, MS, OTR/L , CBIS ascom [phone]  04/14/20, 3:16 PM

## 2020-04-15 DIAGNOSIS — N179 Acute kidney failure, unspecified: Secondary | ICD-10-CM

## 2020-04-15 DIAGNOSIS — R55 Syncope and collapse: Secondary | ICD-10-CM

## 2020-04-15 DIAGNOSIS — R7989 Other specified abnormal findings of blood chemistry: Secondary | ICD-10-CM

## 2020-04-15 DIAGNOSIS — R778 Other specified abnormalities of plasma proteins: Secondary | ICD-10-CM

## 2020-04-15 DIAGNOSIS — G9341 Metabolic encephalopathy: Secondary | ICD-10-CM

## 2020-04-15 LAB — BASIC METABOLIC PANEL
Anion gap: 11 (ref 5–15)
BUN: 13 mg/dL (ref 8–23)
CO2: 26 mmol/L (ref 22–32)
Calcium: 9.2 mg/dL (ref 8.9–10.3)
Chloride: 103 mmol/L (ref 98–111)
Creatinine, Ser: 1.1 mg/dL — ABNORMAL HIGH (ref 0.44–1.00)
GFR, Estimated: 52 mL/min — ABNORMAL LOW (ref >60)
Glucose, Bld: 130 mg/dL — ABNORMAL HIGH (ref 70–99)
Potassium: 3.1 mmol/L — ABNORMAL LOW (ref 3.5–5.1)
Sodium: 140 mmol/L (ref 135–145)

## 2020-04-15 LAB — VITAMIN B12: Vitamin B-12: 505 pg/mL (ref 180–914)

## 2020-04-15 LAB — LIPID PANEL
Cholesterol: 210 mg/dL — ABNORMAL HIGH (ref 0–200)
HDL: 76 mg/dL (ref >40)
LDL Cholesterol: 111 mg/dL — ABNORMAL HIGH (ref 0–99)
Total CHOL/HDL Ratio: 2.8 RATIO
Triglycerides: 115 mg/dL (ref <150)
VLDL: 23 mg/dL (ref 0–40)

## 2020-04-15 LAB — MAGNESIUM: Magnesium: 1.8 mg/dL (ref 1.7–2.4)

## 2020-04-15 LAB — PHOSPHORUS: Phosphorus: 2 mg/dL — ABNORMAL LOW (ref 2.5–4.6)

## 2020-04-15 MED ORDER — PANTOPRAZOLE SODIUM 40 MG PO TBEC: 40.0000 mg | DELAYED_RELEASE_TABLET | Freq: Every day | ORAL | 0 refills | Status: DC

## 2020-04-15 MED ORDER — ATORVASTATIN CALCIUM 10 MG PO TABS
20.0000 mg | ORAL_TABLET | Freq: Every day | ORAL | Status: DC
  Administered 2020-04-16 – 2020-04-21 (×3): 20 mg via ORAL
  Filled 2020-04-15 (×3): qty 1
  Filled 2020-04-15: qty 2
  Filled 2020-04-15 (×3): qty 1

## 2020-04-15 MED ORDER — K PHOS MONO-SOD PHOS DI & MONO 155-852-130 MG PO TABS
500.0000 mg | ORAL_TABLET | ORAL | Status: DC
  Filled 2020-04-15 (×2): qty 2

## 2020-04-15 MED ORDER — VITAMIN B-12 1000 MCG PO TABS: 1000.0000 ug | ORAL_TABLET | Freq: Every day | ORAL | 0 refills | Status: DC

## 2020-04-15 MED ORDER — ASPIRIN 81 MG PO TBEC: 81.0000 mg | DELAYED_RELEASE_TABLET | Freq: Every day | ORAL | 0 refills | Status: DC

## 2020-04-15 MED ORDER — ATORVASTATIN CALCIUM 20 MG PO TABS: 20.0000 mg | ORAL_TABLET | Freq: Every day | ORAL | 0 refills | Status: DC

## 2020-04-15 MED ORDER — ATORVASTATIN CALCIUM 20 MG PO TABS: 40.0000 mg | ORAL_TABLET | Freq: Every day | ORAL | Status: DC

## 2020-04-15 MED ORDER — CARVEDILOL 3.125 MG PO TABS: 3.1250 mg | ORAL_TABLET | Freq: Two times a day (BID) | ORAL | 0 refills | Status: DC

## 2020-04-15 MED ORDER — CARBIDOPA-LEVODOPA 25-100 MG PO TABS: 2.0000 | ORAL_TABLET | Freq: Three times a day (TID) | ORAL | Status: AC

## 2020-04-15 MED ORDER — POTASSIUM PHOSPHATES 15 MMOLE/5ML IV SOLN
20.0000 mmol | Freq: Once | INTRAVENOUS | Status: AC
  Administered 2020-04-15: 20 mmol via INTRAVENOUS
  Filled 2020-04-15: qty 6.67

## 2020-04-15 NOTE — Progress Notes (Addendum)
At approximately 9 pm pt became severely agitated; hitting, biting, swinging telemetry box at nurses, throwing cups of water, and pulling at IV line. Pt refused initial assessment, telemetry monitor, vital signs, and taking medications. Mits placed on pt and physician messaged for PRN medication.

## 2020-04-15 NOTE — Progress Notes (Signed)
PT Cancellation Note  Patient Details Name: Terri Perez MRN: [id number] DOB: [birth year]   Cancelled Treatment:    Reason Eval/Treat Not Completed: Other (comment). Evaluation attempted. Husband in room stating they are getting ready for discharge to hopefully beat the severe weather anticipated. Discharge order in chart. Discussed home health and equipment needs with husband stating no needs at this time. Pt sleeping and doesn't awaken to verbal cues. Per discussion, no acute PT needs at this time. Will dc in house. Please re-order if needs change.   [name] 04/15/2020, 10:38 AM  Greggory Stallion, PT, DPT [phone]

## 2020-04-15 NOTE — Progress Notes (Signed)
Progress Note  Patient Name: Terri Perez Date of Encounter: 04/15/2020  Primary Cardiologist: Nelva Bush, MD  Subjective   Sitting up in bed, no complaints of chest pain, dyspnea, palpitations, dizziness, lightheadedness, n/v.   Inpatient Medications    Scheduled Meds: . aspirin EC  81 mg Oral Daily  . carbidopa-levodopa  2 tablet Oral QID  . carbidopa-levodopa  2 tablet Oral QHS  . carvedilol  3.125 mg Oral BID WC  . feeding supplement  1 Container Oral TID BM  . multivitamin with minerals  1 tablet Oral Daily   Continuous Infusions: . sodium chloride 75 mL/hr at 04/14/20 1632  . potassium PHOSPHATE IVPB (in mmol)     PRN Meds: acetaminophen, ALPRAZolam, calcium carbonate, haloperidol lactate   Vital Signs    Vitals:   04/14/20 1606 04/15/20 0749 04/15/20 0931 04/15/20 1125  BP: (!) 163/76 (!) 183/91 (!) 148/69 138/69  Pulse: 87 99 91 94  Resp: 17 18  18   Temp: 99.6 F (37.6 C) 98 F (36.7 C)  98.6 F (37 C)  TempSrc: Oral Oral  Oral  SpO2: 94% 97%  96%  Weight:  98.1 kg    Height:        Intake/Output Summary (Last 24 hours) at 04/15/2020 1151 Last data filed at 04/15/2020 0600 Gross per 24 hour  Intake 588.17 ml  Output 2100 ml  Net -1511.83 ml   Filed Weights   04/13/20 1554 04/14/20 0357 04/15/20 0749  Weight: 92.5 kg 91.1 kg 98.1 kg    Physical Exam   GEN: Obese, well nourished, in no acute distress.  HEENT: Grossly normal.  Neck: Supple, no JVD, carotid bruits, or masses. Cardiac: RRR, no rubs, gallops, S3, S4. 1/6 systolic murmur RUSB. No clubbing, cyanosis. Bilateral non-pitting edema to calves. Radials 2+, DP/PT 2+ and equal bilaterally.  Respiratory:  Respirations regular and unlabored, clear to auscultation bilaterally. GI: Soft, nontender, nondistended, BS + x 4. MS: no deformity or atrophy. Skin: warm and dry, no rash. Neuro:  Strength and sensation are intact. Psych: alert and oriented to person, place.  Normal  affect.  Labs    Chemistry Recent Labs  Lab 04/13/20 1613 04/13/20 2316 04/14/20 0107 04/14/20 1114 04/15/20 0848  NA 135 137 138  --  140  K 2.7* 2.9* 3.1* 3.6 3.1*  CL 95* 101 102  --  103  CO2 29 28 31   --  26  GLUCOSE 142* 114* 104*  --  130*  BUN 16 13 13   --  13  CREATININE 1.45* 1.08* 1.00  --  1.10*  CALCIUM 11.1* 9.7 9.6  --  9.2  PROT 7.4  --  5.9*  --   --   ALBUMIN 3.9  --  3.2*  --   --   AST 15  --  12*  --   --   ALT 6  --  <5  --   --   ALKPHOS 75  --  59  --   --   BILITOT 1.0  --  0.8  --   --   GFRNONAA 38* 54* 59*  --  52*  ANIONGAP 11 8 5   --  11     Hematology Recent Labs  Lab 04/13/20 1613 04/14/20 0107  WBC 8.6 11.1*  RBC 4.33 3.74*  HGB 13.2 11.1*  HCT 39.4 34.8*  MCV 91.0 93.0  MCH 30.5 29.7  MCHC 33.5 31.9  RDW 14.8 14.9  PLT 197 168  Cardiac Enzymes  Recent Labs  Lab 04/13/20 1915 04/13/20 2316 04/14/20 0107 04/14/20 0247 04/14/20 0436  TROPONINIHS 61* 65* 65* 66* 53*      Lipids  Lab Results  Component Value Date   CHOL 210 (H) 04/15/2020   HDL 76 04/15/2020   LDLCALC 111 (H) 04/15/2020   TRIG 115 04/15/2020   CHOLHDL 2.8 04/15/2020    HbA1c  Lab Results  Component Value Date   HGBA1C 5.5 09/20/2016    Radiology    DG Chest 2 View  Result Date: 04/13/2020 CLINICAL DATA:  Status post fall. EXAM: CHEST - 2 VIEW COMPARISON:  Chest x-ray 04/01/2014 FINDINGS: The heart size and mediastinal contours are unchanged. Aortic arch calcifications. No focal consolidation. No pulmonary edema. No pleural effusion. No pneumothorax. No acute osseous abnormality. IMPRESSION: No acute cardiopulmonary abnormality. Electronically Signed   By: Iven Finn M.D.   On: 04/13/2020 17:13   CT Head Wo Contrast  Result Date: 04/13/2020 CLINICAL DATA:  Limited history, patient poor histroian. Hx of breast cancer and Parkinson's. Per nurse note: Arrives by EMS s/p fall. Did not hit head, no LOC. EXAM: CT HEAD WITHOUT CONTRAST CT  CERVICAL SPINE WITHOUT CONTRAST TECHNIQUE: Multidetector CT imaging of the head and cervical spine was performed following the standard protocol without intravenous contrast. Multiplanar CT image reconstructions of the cervical spine were also generated. COMPARISON:  MR head 05/06/2016 FINDINGS: CT HEAD FINDINGS Brain: Cerebral ventricle sizes are concordant with the degree of cerebral volume loss. No evidence of large-territorial acute infarction. No parenchymal hemorrhage. No mass lesion. No extra-axial collection. No mass effect or midline shift. No hydrocephalus. Basilar cisterns are patent. Vascular: No hyperdense vessel. Skull: No acute fracture or focal lesion. Sinuses/Orbits: Paranasal sinuses and mastoid air cells are clear. The orbits are unremarkable. Other: None. CT CERVICAL SPINE FINDINGS Alignment: Normal. Skull base and vertebrae: Diffusely decreased bone density. Multilevel mild degenerative changes of the spine. Partial fusion of the C5-C6 vertebral bodies. Mild to moderate intervertebral disc spaces at the C6-C7 level. No acute fracture. No aggressive appearing focal osseous lesion or focal pathologic process. Soft tissues and spinal canal: No prevertebral fluid or swelling. No visible canal hematoma. Upper chest: Unremarkable. Other: None. IMPRESSION: 1. No acute intracranial abnormality. 2. No acute displaced fracture or traumatic listhesis of the cervical spine. Electronically Signed   By: Iven Finn M.D.   On: 04/13/2020 18:19   CT Angio Chest PE W and/or Wo Contrast  Result Date: 04/13/2020 CLINICAL DATA:  PE suspected, high probability. History of breast cancer. EXAM: CT ANGIOGRAPHY CHEST WITH CONTRAST TECHNIQUE: Multidetector CT imaging of the chest was performed using the standard protocol during bolus administration of intravenous contrast. Multiplanar CT image reconstructions and MIPs were obtained to evaluate the vascular anatomy. CONTRAST:  58mL OMNIPAQUE IOHEXOL 350 MG/ML SOLN  COMPARISON:  None. FINDINGS: Cardiovascular: No filling defects in the pulmonary arteries to suggest pulmonary emboli. Cardiomegaly. Coronary artery and aortic calcifications. No aneurysm. Mediastinum/Nodes: No mediastinal, hilar, or axillary adenopathy. Trachea and esophagus are unremarkable. Lungs/Pleura: Bibasilar atelectasis. No confluent opacities or effusions. Upper Abdomen: Imaging into the upper abdomen demonstrates no acute findings. Musculoskeletal: Chest wall soft tissues are unremarkable. No acute bony abnormality. Review of the MIP images confirms the above findings. IMPRESSION: No evidence of pulmonary embolus. Cardiomegaly, coronary artery disease. Bibasilar atelectasis. Aortic Atherosclerosis (ICD10-I70.0). Electronically Signed   By: Rolm Baptise M.D.   On: 04/13/2020 18:19   CT Cervical Spine Wo Contrast  Result Date: 04/13/2020  CLINICAL DATA:  Limited history, patient poor histroian. Hx of breast cancer and Parkinson's. Per nurse note: Arrives by EMS s/p fall. Did not hit head, no LOC. EXAM: CT HEAD WITHOUT CONTRAST CT CERVICAL SPINE WITHOUT CONTRAST TECHNIQUE: Multidetector CT imaging of the head and cervical spine was performed following the standard protocol without intravenous contrast. Multiplanar CT image reconstructions of the cervical spine were also generated. COMPARISON:  MR head 05/06/2016 FINDINGS: CT HEAD FINDINGS Brain: Cerebral ventricle sizes are concordant with the degree of cerebral volume loss. No evidence of large-territorial acute infarction. No parenchymal hemorrhage. No mass lesion. No extra-axial collection. No mass effect or midline shift. No hydrocephalus. Basilar cisterns are patent. Vascular: No hyperdense vessel. Skull: No acute fracture or focal lesion. Sinuses/Orbits: Paranasal sinuses and mastoid air cells are clear. The orbits are unremarkable. Other: None. CT CERVICAL SPINE FINDINGS Alignment: Normal. Skull base and vertebrae: Diffusely decreased bone density.  Multilevel mild degenerative changes of the spine. Partial fusion of the C5-C6 vertebral bodies. Mild to moderate intervertebral disc spaces at the C6-C7 level. No acute fracture. No aggressive appearing focal osseous lesion or focal pathologic process. Soft tissues and spinal canal: No prevertebral fluid or swelling. No visible canal hematoma. Upper chest: Unremarkable. Other: None. IMPRESSION: 1. No acute intracranial abnormality. 2. No acute displaced fracture or traumatic listhesis of the cervical spine. Electronically Signed   By: Iven Finn M.D.   On: 04/13/2020 18:19   CT ABDOMEN PELVIS W CONTRAST  Result Date: 04/13/2020 CLINICAL DATA:  Fall, history breast cancer, Parkinson's disease EXAM: CT ABDOMEN AND PELVIS WITH CONTRAST TECHNIQUE: Multidetector CT imaging of the abdomen and pelvis was performed using the standard protocol following bolus administration of intravenous contrast. Sagittal and coronal MPR images reconstructed from axial data set. CONTRAST:  75mL OMNIPAQUE IOHEXOL 350 MG/ML SOLN IV. No oral contrast. COMPARISON:  06/01/2010 FINDINGS: Lower chest: Mild bibasilar atelectasis Hepatobiliary: Focal fatty infiltration of liver adjacent to falciform fissure. Liver and gallbladder otherwise normal appearance Pancreas: Atrophic without mass Spleen: Normal appearance Adrenals/Urinary Tract: Small BILATERAL nonobstructing renal calculi. Small peripelvic LEFT renal cysts. Adrenal glands, kidneys, ureters, and bladder otherwise normal appearance Stomach/Bowel: Bowel anastomosis in pelvis, ileocolic. Prior subtotal colectomy with residual rectum and short segment of sigmoid colon. Remaining bowel loops unremarkable. Stomach normal appearance. Vascular/Lymphatic: Retroaortic LEFT renal vein. Aorta normal caliber. Mild atherosclerotic calcifications aorta. No adenopathy. Reproductive: Uterus surgically absent.  Probable atrophic ovaries. Other: Small umbilical hernia containing fat. No free air or  free fluid. No inflammatory process. Musculoskeletal: Osseous demineralization. RIGHT hip prosthesis. Subacute to old fractures of the LEFT transverse processes of L1 and L2. Old fracture posterior LEFT twelfth rib. Superior endplate compression fracture of L2 with mild anterior height loss, appears old. IMPRESSION: Prior subtotal colectomy. Small umbilical hernia containing fat. Nonobstructing renal calculi. Small BILATERAL nonobstructing renal calculi and small peripelvic LEFT renal cysts. Subacute to old fractures of the LEFT twelfth rib, LEFT transverse processes of L1 and L2 and superior endplate compression fracture of L2. No acute intra-abdominal or intrapelvic abnormalities. Aortic Atherosclerosis (ICD10-I70.0). Electronically Signed   By: Lavonia Dana M.D.   On: 04/13/2020 18:21   US Carotid Bilateral  Result Date: 04/14/2020 CLINICAL DATA:  75 year old female with history of syncope. EXAM: BILATERAL CAROTID DUPLEX ULTRASOUND TECHNIQUE: Pearline Cables scale imaging, color Doppler and duplex ultrasound were performed of bilateral carotid and vertebral arteries in the neck. COMPARISON:  None. FINDINGS: Criteria: Quantification of carotid stenosis is based on velocity parameters that correlate the residual  internal carotid diameter with NASCET-based stenosis levels, using the diameter of the distal internal carotid lumen as the denominator for stenosis measurement. The following velocity measurements were obtained: RIGHT ICA: Peak systolic velocity 782 cm/sec, End diastolic velocity 31 cm/sec CCA: Peak systolic velocity 71 cm/sec SYSTOLIC ICA/CCA RATIO:  1.6 ECA: Peak systolic velocity 96 cm/sec LEFT ICA: Peak systolic velocity 98 cm/sec, End diastolic velocity 29 cm/sec CCA: 81 cm/sec SYSTOLIC ICA/CCA RATIO:  1.2 ECA: 80 cm/sec RIGHT CAROTID ARTERY: Mild focal atherosclerotic plaque formation about the proximal ICA. No significant tortuosity. Normal low resistance waveforms. RIGHT VERTEBRAL ARTERY:  Antegrade flow.  LEFT CAROTID ARTERY: Mild multifocal atherosclerotic plaque formation about the carotid bulb and bifurcation. No significant tortuosity. Normal low resistance waveforms. LEFT VERTEBRAL ARTERY:  Antegrade flow. Upper extremity non-invasive blood pressures: Not obtained. IMPRESSION: 1. Right carotid artery system: Less than 50% stenosis secondary to mild atherosclerotic plaque formation. 2. Left carotid artery system: Less than 50% stenosis secondary to mild atherosclerotic plaque formation. 3.  Vertebral artery system: Patent with antegrade flow bilaterally. Ruthann Cancer, MD [facility] Electronically Signed   By: Ruthann Cancer MD   On: 04/14/2020 14:01    DG ESOPHAGUS W SINGLE CM (SOL OR THIN BA)  Result Date: 04/14/2020 CLINICAL DATA:  Dysphagia EXAM: ESOPHOGRAM/BARIUM SWALLOW TECHNIQUE: Single contrast examination was performed using thin barium or water soluble. FLUOROSCOPY TIME:  Fluoroscopy Time:  42 seconds Radiation Exposure Index (if provided by the fluoroscopic device): 6.3 mGy Number of Acquired Spot Images: 0 COMPARISON:  None. FINDINGS: Limited evaluation secondary to limited patient mobility. Patient is unable to stand. Examination was performed in the semi upright position. Normal swallow function. Contrast transit through the esophagus without delay. No esophageal ulceration, mass or stricture. Tertiary contractions of the distal third of the esophagus as can be seen with presbyesophagus versus spasm. At the end of the examination a 13 mm barium tablet was administered which transited through the esophagus and esophagogastric junction without delay. IMPRESSION: 1. No esophageal stricture. 2. Tertiary contractions of the distal third of the esophagus as can be seen with presbyesophagus versus spasm. Electronically Signed   By: Kathreen Devoid   On: 04/14/2020 16:29    Telemetry    RSR 80's to 90's  - Personally Reviewed  Cardiac  Studies   Echo 04/14/20  1. Left ventricular ejection fraction, by estimation, is 50 to 55%. The  left ventricle has low normal function. Left ventricular endocardial  border not optimally defined to evaluate regional wall motion. There is  mild left ventricular hypertrophy. Left  ventricular diastolic parameters are consistent with Grade I diastolic  dysfunction (impaired relaxation).  2. Right ventricular systolic function is normal. The right ventricular  size is normal. Tricuspid regurgitation signal is inadequate for assessing  PA pressure.  3. The mitral valve is grossly normal. Trivial mitral valve  regurgitation. No evidence of mitral stenosis.  4. The aortic valve was not well visualized. Aortic valve regurgitation  is not visualized. No aortic stenosis is present.  5. The tricuspid valve is grossly normal. Tricuspid valve  regurgitation is not demonstrated.  6. The pulmonic valve was not well visualized. Pulmonic valve  regurgitation is not visualized. No evidence of pulmonic stenosis.  7. The aortic root is normal in size and structure  Patient Profile     75 y.o. female with a history of breast cancer, colon resection for neoplasm, Parkinson's, obesity, bilateral hip replacement, and femur fracture, who was  admitted on 3/21 and found to have elevated Hs Troponin.   Assessment & Plan    1. Demand Ischemia: She was admitted on 3/21 with recurrent falls and presyncope/syncope. Hs Trop mildly elevated w/flat trend and peak of 66. No evidence of ACS or h/o chest pain. She has chronic dyspnea on exertion in the setting of chronic deconditioning, but no recent change in activity tolerance or palpitations. Echo w/low-nl LVEF 50-55%. With limited mobility and body habitus, she is not an ideal candidate for noninvasive or invasive testing at this time.  Continue  blocker and asa. LDL today is 111, will add high dose statin.   2. Sinus Tachycardia: Noted on admission.  HR has been  80's - 90's through the night and this morning. She has been started on carvedilol 3.125 bid. In recent history, she has had multiple outpatient appointments w/HR > 85 bpm. Patient and husband admit to poor oral intake at home. She has been encouraged to increase oral hydration and has had good urine output during this admission. She has recent history of unintentional weight loss > 20 lbs. No sign of infection, no evidence of PE on CTA. Hx CKD with elevated creatinine during this admission ~ currently at 1.10. Suspect poor hydration/nutrition, chronic pain may be contributing. She also has a documented history of anxiety and became very upset with staff last night. Continue  blocker and continue to monitor. In the setting of pre-syncope, we will offer patient outpatient cardiac monitor.   3. Fall/pre-syncope: She reports a feeling that "lights are going dim" prior to fall on 3/21. She and husband report increasing episodes of lightheadedness/leg weakness with activity over the past few months. They provide a strong history of orthostatic symptoms and frequent presyncope when walking in her home. She is not very active at home ~ mostly walks from front to back of house with assistance of walker and husband following behind with a wheelchair in the event that she becomes lightheaded and needs to sit. She is on carbidopa/levodopa for Parkinson's and at recent neurology appointment, Dr. Sunday Corn felt that symptoms were related to the progression of the disease. She recently started and subsequently stopped ropinirole due to excessive sleepiness. Echo w/low-nl LVEF, making occurrence of ventricular arrhythmias less likely. Carotid u/s show bilat <50% ICA stenoses. Telemetry showing RSR/sinus tach without ectopy. Will place 14 day zio at discharge for further evaluation.  4. CKD Stage 3a: Creatinine up from baseline this admission, currently stable. Encouraging better oral hydration and avoid nephrotoxic agents. Not on  ACEi/ARB. Receiving IV potassium today. Management per IM.   5. Hypokalemia/Hypomagnesemia: K+ 3.1 today. She has potassium chloride infusion ordered for today. Magnesium now nl. Management per IM.   6.  HL:  LDL 111.  In setting of mild HsT elevation and Ao atherosclerosis noted on CT, will add statin rx.  Signed, Murray Hodgkins, NP  04/15/2020, 11:51 AM    For questions or updates, please contact   Please consult [email or web address] for contact info under Cardiology/STEMI.

## 2020-04-15 NOTE — Plan of Care (Signed)
  Problem: Clinical Measurements: Goal: Will remain free from infection Outcome: Progressing   Problem: Nutrition: Goal: Adequate nutrition will be maintained Outcome: Progressing   Problem: Elimination: Goal: Will not experience complications related to bowel motility Outcome: Progressing Goal: Will not experience complications related to urinary retention Outcome: Progressing   Problem: Safety: Goal: Ability to remain free from injury will improve Outcome: Progressing   Problem: Skin Integrity: Goal: Risk for impaired skin integrity will decrease Outcome: Progressing

## 2020-04-15 NOTE — Progress Notes (Signed)
Mobility Specialist - Progress Note    04/15/20 1300  Mobility  Activity Refused mobility  Mobility performed by Mobility specialist    Pt politely declined mobility this date d/t fatigue. Pt states she is too tired to even feed herself at this time. Spouse at bedside assisting with lunch. Will attempt session at another date/time.    Kathee Delton Mobility Specialist 04/15/20, 1:58 PM

## 2020-04-15 NOTE — Progress Notes (Signed)
SLP Cancellation Note  Patient Details Name: Terri Perez MRN: [id number] DOB: [birth year]   Cancelled treatment:       Reason Eval/Treat Not Completed: Patient declined, no reason specified   Discharge summary in chart, pt and husband hope to discharge prior to anticipated severe weather.   Happi B. Rutherford Nail M.S., CCC-SLP, [place] [facility] [phone]    Happi Rutherford Nail 04/15/2020, 1:50 PM

## 2020-04-15 NOTE — Discharge Summary (Signed)
Physician Discharge Summary  [name] MRN: [id number] DOB/AGE: [birth year] 75 y.o.  Admit date: 04/13/2020 Discharge date: 04/15/2020  Admission Diagnoses:  Discharge Diagnoses:  Principal Problem:   Syncope and collapse Active Problems:   Acute anxiety   Syncope   Chronic kidney disease (CKD), stage III (moderate) (HCC)   Parkinson disease (HCC)   Hypokalemia   Dehydration   Acute metabolic encephalopathy   AKI (acute kidney injury) ([place])   Intractable vomiting with nausea   Discharged Condition: good  Hospital Course:  Terri Perez is a 75 year old female who lives with her husband and has a medical h/o  CKD 3B, anxiety, Parkinson's disease masked facies, obesity, chronic constipation, rheumatoid arthritis with positive rheumatoid factor, fibromyalgia, osteoporosis with multiple fractures, breast cancer in remission who presents to the hospital after a fall. It appears that patient had a recurrent falls in the past, she had multiple old fractures of on the ribs and spine. 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 She also had some dysphagia, barium esophagram showed esophageal spasm without stenosis.  I have scheduled the patient to see gastroenterology as outpatient.  #1.  Syncope and collapse Minimal elevation of troponin secondary to demand ischemia. This appears to be secondary to autonomic dysfunction dehydration.  Patient be followed by cardiology and PCP as outpatient.  2.  Hypokalemia and hypophosphatemia We will give a dose of potassium phosphorus IV.  3.  Weight loss, esophageal spasm. Patient has a poor appetite, she received fluids for dehydration.  I will start Protonix.  I will refer patient to outpatient GI for possible EGD.  4.  Acute encephalopathy. Vitamin B12  deficiency. Start chronic B12 supplements.  Does not have any agitation today.  #5.  Parkinson disease  Patient is requiring potassium phosphorus IV infusion, will be very late when it is finished.  Husband does not want take patient home today.  Will discharge tomorrow.  Consults: cardiology  Significant Diagnostic Studies:  ESOPHOGRAM/BARIUM SWALLOW  TECHNIQUE: Single contrast examination was performed using thin barium or water soluble.  FLUOROSCOPY TIME:  Fluoroscopy Time:  42 seconds  Radiation Exposure Index (if provided by the fluoroscopic device): 6.3 mGy  Number of Acquired Spot Images: 0  COMPARISON:  None.  FINDINGS: Limited evaluation secondary to limited patient mobility. Patient is unable to stand. Examination was performed in the semi upright position. Normal swallow function. Contrast transit through the esophagus without delay. No esophageal ulceration, mass or stricture. Tertiary contractions of the distal third of the esophagus as can be seen with presbyesophagus versus spasm.  At the end of the examination a 13 mm barium tablet was administered which transited through the esophagus and esophagogastric junction without delay.  IMPRESSION: 1. No esophageal stricture. 2. Tertiary contractions of the distal third of the esophagus as can be seen with presbyesophagus versus spasm.   Electronically Signed   By: Kathreen Devoid   On: 04/14/2020 16:29  BILATERAL CAROTID DUPLEX ULTRASOUND  TECHNIQUE: Pearline Cables scale imaging, color Doppler and duplex ultrasound were performed of bilateral carotid and vertebral arteries in the neck.  COMPARISON:  None.  FINDINGS: Criteria: Quantification of carotid stenosis is based on velocity parameters that correlate the residual internal carotid diameter with NASCET-based stenosis levels, using the diameter of the distal internal carotid lumen as the denominator for stenosis measurement.  The  following  velocity measurements were obtained:  RIGHT  ICA: Peak systolic velocity 295 cm/sec, End diastolic velocity 31 cm/sec  CCA: Peak systolic velocity 71 cm/sec  SYSTOLIC ICA/CCA RATIO:  1.6  ECA: Peak systolic velocity 96 cm/sec  LEFT  ICA: Peak systolic velocity 98 cm/sec, End diastolic velocity 29 cm/sec  CCA: 81 cm/sec  SYSTOLIC ICA/CCA RATIO:  1.2  ECA: 80 cm/sec  RIGHT CAROTID ARTERY: Mild focal atherosclerotic plaque formation about the proximal ICA. No significant tortuosity. Normal low resistance waveforms.  RIGHT VERTEBRAL ARTERY:  Antegrade flow.  LEFT CAROTID ARTERY: Mild multifocal atherosclerotic plaque formation about the carotid bulb and bifurcation. No significant tortuosity. Normal low resistance waveforms.  LEFT VERTEBRAL ARTERY:  Antegrade flow.  Upper extremity non-invasive blood pressures:  Not obtained.  IMPRESSION: 1. Right carotid artery system: Less than 50% stenosis secondary to mild atherosclerotic plaque formation.  2. Left carotid artery system: Less than 50% stenosis secondary to mild atherosclerotic plaque formation.  3.  Vertebral artery system: Patent with antegrade flow bilaterally.  Ruthann Cancer, MD  [facility]  [facility]   Electronically Signed   By: Ruthann Cancer MD   On: 04/14/2020 14:01  CT HEAD WITHOUT CONTRAST  CT CERVICAL SPINE WITHOUT CONTRAST  TECHNIQUE: Multidetector CT imaging of the head and cervical spine was performed following the standard protocol without intravenous contrast. Multiplanar CT image reconstructions of the cervical spine were also generated.  COMPARISON:  MR head 05/06/2016  FINDINGS: CT HEAD FINDINGS  Brain:  Cerebral ventricle sizes are concordant with the degree of cerebral volume loss.  No evidence of large-territorial acute infarction. No parenchymal hemorrhage. No mass lesion. No  extra-axial collection.  No mass effect or midline shift. No hydrocephalus. Basilar cisterns are patent.  Vascular: No hyperdense vessel.  Skull: No acute fracture or focal lesion.  Sinuses/Orbits: Paranasal sinuses and mastoid air cells are clear. The orbits are unremarkable.  Other: None.  CT CERVICAL SPINE FINDINGS  Alignment: Normal.  Skull base and vertebrae: Diffusely decreased bone density. Multilevel mild degenerative changes of the spine. Partial fusion of the C5-C6 vertebral bodies. Mild to moderate intervertebral disc spaces at the C6-C7 level. No acute fracture. No aggressive appearing focal osseous lesion or focal pathologic process.  Soft tissues and spinal canal: No prevertebral fluid or swelling. No visible canal hematoma.  Upper chest: Unremarkable.  Other: None.  IMPRESSION: 1. No acute intracranial abnormality. 2. No acute displaced fracture or traumatic listhesis of the cervical spine.   Electronically Signed   By: Iven Finn M.D.   On: 04/13/2020 18:19  CT ABDOMEN AND PELVIS WITH CONTRAST  TECHNIQUE: Multidetector CT imaging of the abdomen and pelvis was performed using the standard protocol following bolus administration of intravenous contrast. Sagittal and coronal MPR images reconstructed from axial data set.  CONTRAST:  34mL OMNIPAQUE IOHEXOL 350 MG/ML SOLN IV. No oral contrast.  COMPARISON:  06/01/2010  FINDINGS: Lower chest: Mild bibasilar atelectasis  Hepatobiliary: Focal fatty infiltration of liver adjacent to falciform fissure. Liver and gallbladder otherwise normal appearance  Pancreas: Atrophic without mass  Spleen: Normal appearance  Adrenals/Urinary Tract: Small BILATERAL nonobstructing renal calculi. Small peripelvic LEFT renal cysts. Adrenal glands, kidneys, ureters, and bladder otherwise normal appearance  Stomach/Bowel: Bowel anastomosis in pelvis, ileocolic. Prior subtotal colectomy  with residual rectum and short segment of sigmoid colon. Remaining bowel loops unremarkable. Stomach normal appearance.  Vascular/Lymphatic: Retroaortic LEFT renal vein. Aorta normal caliber. Mild atherosclerotic calcifications aorta. No adenopathy.  Reproductive:  Uterus surgically absent.  Probable atrophic ovaries.  Other: Small umbilical hernia containing fat. No free air or free fluid. No inflammatory process.  Musculoskeletal: Osseous demineralization. RIGHT hip prosthesis. Subacute to old fractures of the LEFT transverse processes of L1 and L2. Old fracture posterior LEFT twelfth rib. Superior endplate compression fracture of L2 with mild anterior height loss, appears old.  IMPRESSION: Prior subtotal colectomy.  Small umbilical hernia containing fat.  Nonobstructing renal calculi.  Small BILATERAL nonobstructing renal calculi and small peripelvic LEFT renal cysts.  Subacute to old fractures of the LEFT twelfth rib, LEFT transverse processes of L1 and L2 and superior endplate compression fracture of L2.  No acute intra-abdominal or intrapelvic abnormalities.  Aortic Atherosclerosis (ICD10-I70.0).   Electronically Signed   By: Lavonia  M.D.   On: 04/13/2020 18:21  CT ANGIOGRAPHY CHEST WITH CONTRAST  TECHNIQUE: Multidetector CT imaging of the chest was performed using the standard protocol during bolus administration of intravenous contrast. Multiplanar CT image reconstructions and MIPs were obtained to evaluate the vascular anatomy.  CONTRAST:  7mL OMNIPAQUE IOHEXOL 350 MG/ML SOLN  COMPARISON:  None.  FINDINGS: Cardiovascular: No filling defects in the pulmonary arteries to suggest pulmonary emboli. Cardiomegaly. Coronary artery and aortic calcifications. No aneurysm.  Mediastinum/Nodes: No mediastinal, hilar, or axillary adenopathy. Trachea and esophagus are unremarkable.  Lungs/Pleura: Bibasilar atelectasis. No confluent  opacities or effusions.  Upper Abdomen: Imaging into the upper abdomen demonstrates no acute findings.  Musculoskeletal: Chest wall soft tissues are unremarkable. No acute bony abnormality.  Review of the MIP images confirms the above findings.  IMPRESSION: No evidence of pulmonary embolus.  Cardiomegaly, coronary artery disease.  Bibasilar atelectasis.  Aortic Atherosclerosis (ICD10-I70.0).   Electronically Signed   By: Rolm Baptise M.D.   On: 04/13/2020 18:19    Treatments: Cardiac monitoring.  Discharge Exam: Blood pressure (!) 148/69, pulse 91, temperature 98 F (36.7 C), temperature source Oral, resp. rate 18, height 5\' 3"  (1.6 m), weight 98.1 kg, SpO2 97 %. General appearance: alert and cooperative Resp: clear to auscultation bilaterally Cardio: regular rate and rhythm, S1, S2 normal, no murmur, click, rub or gallop GI: soft, non-tender; bowel sounds normal; no masses,  no organomegaly Extremities: extremities normal, atraumatic, no cyanosis or edema    Disposition: Discharge disposition: 01-Home or Self Care       Discharge Instructions    Diet general   Complete by: As directed    Dysphagia 1 diet   Increase activity slowly   Complete by: As directed      Allergies as of 04/15/2020      Reactions   Alendronate Nausea Only   Gi symptoms   Gluten Meal Nausea Only   Other reaction(s): NAUSEA Other reaction(s): NAUSEA Other reaction(s): NAUSEA   Lactose Intolerance (gi) Diarrhea   Nsaids    GI upset. Other reaction(s): Other (See Comments) Pt unsure   Other Other (See Comments)   Other reaction(s): OTHER   Oxycodone-acetaminophen    Oxycodone-acetaminophen Other (See Comments)   Other reaction(s): NAUSEA   Vicodin [hydrocodone-acetaminophen] Nausea Only, Diarrhea, Nausea And Vomiting   Wheat Bran Other (See Comments)   Sulfa Antibiotics Rash   Rash from steri strips   Tape Rash   Rash from steri strips      Medication List     TAKE these medications   ALPRAZolam 0.5 MG tablet Commonly known as: XANAX TAKE ONE TABLET BY MOUTH EVERY 4 HOURS AS NEEDED What changed:   reasons to take this  additional instructions   aspirin 81 MG EC tablet Take 1 tablet (81 mg total) by mouth daily. Swallow whole. Start taking on: April 16, 2020   carbidopa-levodopa 25-100 MG tablet Commonly known as: SINEMET IR Take 2 tablets by mouth 3 (three) times daily. What changed: how much to take   carvedilol 3.125 MG tablet Commonly known as: COREG Take 1 tablet (3.125 mg total) by mouth 2 (two) times daily with a meal.   ondansetron 4 MG disintegrating tablet Commonly known as: ZOFRAN-ODT Take 1 tablet (4 mg total) by mouth every 8 (eight) hours as needed for nausea or vomiting.   pantoprazole 40 MG tablet Commonly known as: Protonix Take 1 tablet (40 mg total) by mouth daily.   traMADol 50 MG tablet Commonly known as: ULTRAM TAKE ONE TABLET BY MOUTH THREE TIMES A DAY AS NEEDED What changed:   how much to take  when to take this       Follow-up Information    Jerrol Banana., MD Follow up in 1 week(s).   Specialty: Family Medicine Contact information: [address] [place] [address] [phone]        Nelva Bush, MD .   Specialty: Cardiology Contact information: [place] [address] [phone]        Jonathon Bellows, MD Follow up in 2 week(s).   Specialty: Gastroenterology Contact information: [place] [address] [phone]              35 minutes Signed: Sharen Hones 04/15/2020, 10:03 AM

## 2020-04-15 NOTE — TOC Initial Note (Addendum)
Transition of Care [facility]) - Initial/Assessment Note    Patient Details  Name: Terri Perez MRN: [id number] Date of Birth: 1945/02/11  [facility]) CM/SW Contact:    Kerin Salen, RN Phone Number: 04/15/2020, 11:45 AM  Clinical Narrative: Patient asleep, husband at bedside, voices need for assistance at home. He is taking care of patient and need to leave for shopping and errands. Husband cooks, shops, assist with ADL's, and transports patient to medical appointments. Husband states they use [place] and patient uses rollator at home with reported falls. Informed husband that I will arrange [facility] services and if he had an agency in mind and he says never had [place] services and it does not matter just need help. [facility] notified and accepted to provide, RN/PT/OT/Aide services.      Husband refuse HH services says he only wants someone to come in two days a week to relieve him so he can run errands. Husband given list for Aide services in the area, voices understanding.            Expected Discharge Plan: [place] Barriers to Discharge: Continued Medical Work up   Patient Goals and CMS Choice Patient states their goals for this hospitalization and ongoing recovery are:: Husband says discharge home with Home Health   Choice offered to / list presented to : Spouse  Expected Discharge Plan and Services Expected Discharge Plan: [place] In-house Referral: Clinical Social Work   Post Acute Care Choice: NA Living arrangements for the past 2 months: [place] Expected Discharge Date: 04/15/20               DME Arranged: N/A DME Agency: NA       HH Arranged: PT,OT,Nurse's [place] Agency: [place] [facility]) Date [place]: 04/15/20 Time [place]: 1143 Representative spoke with at [place]: Floydene Flock  Prior Living Arrangements/Services Living arrangements for the past  2 months: [place] with:: Spouse Patient language and need for interpreter reviewed:: No Do you feel safe going back to the place where you live?: Yes (Spoke with husband patient sleep)      Need for Family Participation in Patient Care: Yes (Comment) Care giver support system in place?: Yes (comment)   Criminal Activity/Legal Involvement Pertinent to Current Situation/Hospitalization: No - Comment as needed  Activities of Daily Living Home Assistive Devices/Equipment: None ADL Screening (condition at time of admission) Patient's cognitive ability adequate to safely complete daily activities?: Yes Is the patient deaf or have difficulty hearing?: No Does the patient have difficulty seeing, even when wearing glasses/contacts?: No Does the patient have difficulty concentrating, remembering, or making decisions?: No Patient able to express need for assistance with ADLs?: Yes Does the patient have difficulty dressing or bathing?: Yes Independently performs ADLs?: No Communication: Independent Dressing (OT): Needs assistance Grooming: Needs assistance Is this a change from baseline?: Pre-admission baseline Feeding: Independent Bathing: Needs assistance Is this a change from baseline?: Pre-admission baseline Toileting: Needs assistance Is this a change from baseline?: Pre-admission baseline In/Out Bed: Needs assistance Is this a change from baseline?: Pre-admission baseline Walks in Home: Independent Does the patient have difficulty walking or climbing stairs?: Yes Weakness of Legs: Both Weakness of Arms/Hands: None  Permission Sought/Granted                  Emotional Assessment Appearance:: Appears stated age Attitude/Demeanor/Rapport: Unable to Assess (Patient  asleep.) Affect (typically observed): Unable to Assess   Alcohol / Substance Use: Not Applicable Psych Involvement: No (comment)  Admission diagnosis:  Syncope and collapse [R55] Hypokalemia  [E87.6] Syncope [R55] AKI (acute kidney injury) ([place]) [N17.9] Fall, initial encounter [W19.XXXA] Patient Active Problem List   Diagnosis Date Noted  . Intractable vomiting with nausea   . Hypokalemia 04/13/2020  . Dehydration 04/13/2020  . Syncope and collapse 04/13/2020  . Acute metabolic encephalopathy 02/72/5366  . AKI (acute kidney injury) ([place]) 04/13/2020  . 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 Fracture of sacrum ([place]) 08/04/2016  . Adiposity 05/05/2016  . Parkinson disease ([place]) 04/25/2016  . 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 Bilateral lower extremity edema 07/23/2015  . 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 Chronic kidney disease (CKD), stage III (moderate) ([place]) 05/11/2015  . Elevated rheumatoid factor 05/11/2015  . Primary osteoarthritis of both hips 05/11/2015  . Syncope 04/24/2015  . History of operative procedure on hip 04/24/2015  . Restless leg 04/24/2015  . Osteoporosis 03/30/2015  . Insomnia 03/30/2015  . Skin cancer 03/30/2015  . MDD (major depressive disorder) 03/30/2015  . Migraine 03/30/2015  . GERD (gastroesophageal reflux disease) 03/30/2015  . IBS (irritable bowel syndrome) 03/30/2015  . OA (osteoarthritis) 03/30/2015  . Skin lesion of chest wall 07/17/2014  . Closed fracture of shaft of femur ([place]) 04/01/2014  . Fracture of bone adjacent to prosthesis 04/01/2014  . Breast cancer ([place]) 03/07/2013  . Personal history of malignant neoplasm of large intestine   . Malignant neoplasm of upper-outer quadrant of female breast ([place])   . Acute anxiety 06/11/2008  . CONSTIPATION, CHRONIC 06/11/2008  . Fibromyalgia 06/11/2008   PCP:  Jerrol Banana., MD Pharmacy:   [facility], [place] [address] Phone: [phone] Fax: [phone]     Social Determinants of Health (SDOH)  Interventions    Readmission Risk Interventions No flowsheet data found.

## 2020-04-16 DIAGNOSIS — W19XXXA Unspecified fall, initial encounter: Secondary | ICD-10-CM

## 2020-04-16 LAB — BASIC METABOLIC PANEL
Anion gap: 8 (ref 5–15)
BUN: 18 mg/dL (ref 8–23)
CO2: 25 mmol/L (ref 22–32)
Calcium: 8.4 mg/dL — ABNORMAL LOW (ref 8.9–10.3)
Chloride: 107 mmol/L (ref 98–111)
Creatinine, Ser: 0.9 mg/dL (ref 0.44–1.00)
GFR, Estimated: >60 mL/min (ref >60)
Glucose, Bld: 100 mg/dL — ABNORMAL HIGH (ref 70–99)
Potassium: 3.3 mmol/L — ABNORMAL LOW (ref 3.5–5.1)
Sodium: 140 mmol/L (ref 135–145)

## 2020-04-16 LAB — PHOSPHORUS: Phosphorus: 2.4 mg/dL — ABNORMAL LOW (ref 2.5–4.6)

## 2020-04-16 LAB — MAGNESIUM: Magnesium: 1.7 mg/dL (ref 1.7–2.4)

## 2020-04-16 MED ORDER — POTASSIUM CHLORIDE 10 MEQ/100ML IV SOLN
10.0000 meq | INTRAVENOUS | Status: AC
Start: 2020-04-16 — End: 2020-04-16
  Administered 2020-04-16 (×2): 10 meq via INTRAVENOUS
  Filled 2020-04-16 (×4): qty 100

## 2020-04-16 MED ORDER — POTASSIUM & SODIUM PHOSPHATES 280-160-250 MG PO PACK
1.0000 | PACK | Freq: Three times a day (TID) | ORAL | Status: AC
  Administered 2020-04-16 – 2020-04-17 (×3): 1 via ORAL
  Filled 2020-04-16 (×5): qty 1

## 2020-04-16 NOTE — Progress Notes (Signed)
Plans for discharge today. Visited Pt who reports she is doing well with the pureed foods as long as she "relaxes her throat" Agree with MD plan for OP GI for possible EGD. Po intake is poor because of amount of time it takes Pt to eat along with fatigue

## 2020-04-16 NOTE — Plan of Care (Signed)

## 2020-04-16 NOTE — Discharge Summary (Signed)
Physician Discharge Summary  [name] MRN: [id number] DOB/AGE: [birth year] 76 y.o.  Admit date: 04/13/2020 Discharge date: 04/16/2020  Admission Diagnoses:  Discharge Diagnoses:  Principal Problem:   Syncope and collapse Active Problems:   Acute anxiety   Syncope   Chronic kidney disease (CKD), stage III (moderate) (HCC)   Parkinson disease (HCC)   Hypokalemia   Dehydration   Acute metabolic encephalopathy   AKI (acute kidney injury) ([place])   Intractable vomiting with nausea   Elevated troponin   Discharged Condition: fair  Hospital Course:   Terri Perez a 75 year old female who lives with her husband and has a medical h/oCKD 3B, anxiety, Parkinson's disease masked facies,obesity, chronic constipation, rheumatoid arthritis with positive rheumatoid factor, fibromyalgia, osteoporosis with multiple fractures, breast cancer in remissionwho presents to the hospital after a fall. It appears that patient had a recurrent falls in the past, she had multiple old fractures of on the ribs and spine. 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 She also had some dysphagia, barium esophagram showed esophageal spasm without stenosis.  I have scheduled the patient to see gastroenterology as outpatient.  #1.  Syncope and collapse Minimal elevation of troponin secondary to demand ischemia. This appears to be secondary to autonomic dysfunction dehydration.  Patient be followed by cardiology and PCP as outpatient.  2.  Hypokalemia and hypophosphatemia We will give a dose of potassium phosphorus IV.  3.  Weight loss, esophageal spasm. Patient has a poor appetite, she received fluids for dehydration.  I will start Protonix.  I will refer patient to outpatient GI for possible EGD.  4.  Acute  encephalopathy. Vitamin B12 deficiency. Start chronic B12 supplements.  Does not have any agitation today.  #5.  Parkinson disease  3/24.  Patient condition had improved, she will give another dose of IV potassium.  At this point she is medically stable to be discharged.  Consults: None  Significant Diagnostic Studies:  ESOPHOGRAM/BARIUM SWALLOW  TECHNIQUE: Single contrast examination was performed using thin barium or water soluble.  FLUOROSCOPY TIME: Fluoroscopy Time: 42 seconds  Radiation Exposure Index (if provided by the fluoroscopic device): 6.3 mGy  Number of Acquired Spot Images: 0  COMPARISON: None.  FINDINGS: Limited evaluation secondary to limited patient mobility. Patient is unable to stand. Examination was performed in the semi upright position. Normal swallow function. Contrast transit through the esophagus without delay. No esophageal ulceration, mass or stricture. Tertiary contractions of the distal third of the esophagus as can be seen with presbyesophagus versus spasm.  At the end of the examination a 13 mm barium tablet was administered which transited through the esophagus and esophagogastric junction without delay.  IMPRESSION: 1. No esophageal stricture. 2. Tertiary contractions of the distal third of the esophagus as can be seen with presbyesophagus versus spasm.   Electronically Signed By: Kathreen Devoid On: 04/14/2020 16:29  BILATERAL CAROTID DUPLEX ULTRASOUND  TECHNIQUE: Pearline Cables scale imaging, color Doppler and duplex ultrasound were performed of bilateral carotid and vertebral arteries in the neck.  COMPARISON: None.  FINDINGS: Criteria: Quantification of carotid stenosis is based on velocity parameters that correlate the residual internal carotid diameter with NASCET-based stenosis levels, using the diameter of the distal internal carotid lumen as the denominator for stenosis measurement.  The following  velocity measurements were obtained:  RIGHT  ICA: Peak systolic  velocity 116 cm/sec, End diastolic velocity 31 cm/sec  CCA: Peak systolic velocity 71 cm/sec  SYSTOLIC ICA/CCA RATIO: 1.6  ECA: Peak systolic velocity 96 cm/sec  LEFT  ICA: Peak systolic velocity 98 cm/sec, End diastolic velocity 29 cm/sec  CCA: 81 cm/sec  SYSTOLIC ICA/CCA RATIO: 1.2  ECA: 80 cm/sec  RIGHT CAROTID ARTERY: Mild focal atherosclerotic plaque formation about the proximal ICA. No significant tortuosity. Normal low resistance waveforms.  RIGHT VERTEBRAL ARTERY: Antegrade flow.  LEFT CAROTID ARTERY: Mild multifocal atherosclerotic plaque formation about the carotid bulb and bifurcation. No significant tortuosity. Normal low resistance waveforms.  LEFT VERTEBRAL ARTERY: Antegrade flow.  Upper extremity non-invasive blood pressures:  Not obtained.  IMPRESSION: 1. Right carotid artery system: Less than 50% stenosis secondary to mild atherosclerotic plaque formation.  2. Left carotid artery system: Less than 50% stenosis secondary to mild atherosclerotic plaque formation.  3. Vertebral artery system: Patent with antegrade flow bilaterally.  Ruthann Cancer, MD  [facility]  [facility]   Electronically Signed By: Ruthann Cancer MD On: 04/14/2020 14:01  CT HEAD WITHOUT CONTRAST  CT CERVICAL SPINE WITHOUT CONTRAST  TECHNIQUE: Multidetector CT imaging of the head and cervical spine was performed following the standard protocol without intravenous contrast. Multiplanar CT image reconstructions of the cervical spine were also generated.  COMPARISON: MR head 05/06/2016  FINDINGS: CT HEAD FINDINGS  Brain:  Cerebral ventricle sizes are concordant with the degree of cerebral volume loss.  No evidence of large-territorial acute infarction. No parenchymal hemorrhage. No mass lesion. No  extra-axial collection.  No mass effect or midline shift. No hydrocephalus. Basilar cisterns are patent.  Vascular: No hyperdense vessel.  Skull: No acute fracture or focal lesion.  Sinuses/Orbits: Paranasal sinuses and mastoid air cells are clear. The orbits are unremarkable.  Other: None.  CT CERVICAL SPINE FINDINGS  Alignment: Normal.  Skull base and vertebrae: Diffusely decreased bone density. Multilevel mild degenerative changes of the spine. Partial fusion of the C5-C6 vertebral bodies. Mild to moderate intervertebral disc spaces at the C6-C7 level. No acute fracture. No aggressive appearing focal osseous lesion or focal pathologic process.  Soft tissues and spinal canal: No prevertebral fluid or swelling. No visible canal hematoma.  Upper chest: Unremarkable.  Other: None.  IMPRESSION: 1. No acute intracranial abnormality. 2. No acute displaced fracture or traumatic listhesis of the cervical spine.   Electronically Signed By: Iven Finn M.D. On: 04/13/2020 18:19  CT ABDOMEN AND PELVIS WITH CONTRAST  TECHNIQUE: Multidetector CT imaging of the abdomen and pelvis was performed using the standard protocol following bolus administration of intravenous contrast. Sagittal and coronal MPR images reconstructed from axial data set.  CONTRAST: 66mL OMNIPAQUE IOHEXOL 350 MG/ML SOLN IV. No oral contrast.  COMPARISON: 06/01/2010  FINDINGS: Lower chest: Mild bibasilar atelectasis  Hepatobiliary: Focal fatty infiltration of liver adjacent to falciform fissure. Liver and gallbladder otherwise normal appearance  Pancreas: Atrophic without mass  Spleen: Normal appearance  Adrenals/Urinary Tract: Small BILATERAL nonobstructing renal calculi. Small peripelvic LEFT renal cysts. Adrenal glands, kidneys, ureters, and bladder otherwise normal appearance  Stomach/Bowel: Bowel anastomosis in pelvis, ileocolic. Prior subtotal  colectomy with residual rectum and short segment of sigmoid colon. Remaining bowel loops unremarkable. Stomach normal appearance.  Vascular/Lymphatic: Retroaortic LEFT renal vein. Aorta normal caliber. Mild atherosclerotic calcifications aorta. No adenopathy.  Reproductive: Uterus surgically absent. Probable atrophic ovaries.  Other: Small umbilical hernia containing fat. No free air or free fluid. No inflammatory process.  Musculoskeletal: Osseous demineralization. RIGHT hip prosthesis. Subacute  to old fractures of the LEFT transverse processes of L1 and L2. Old fracture posterior LEFT twelfth rib. Superior endplate compression fracture of L2 with mild anterior height loss, appears old.  IMPRESSION: Prior subtotal colectomy.  Small umbilical hernia containing fat.  Nonobstructing renal calculi.  Small BILATERAL nonobstructing renal calculi and small peripelvic LEFT renal cysts.  Subacute to old fractures of the LEFT twelfth rib, LEFT transverse processes of L1 and L2 and superior endplate compression fracture of L2.  No acute intra-abdominal or intrapelvic abnormalities.  Aortic Atherosclerosis (ICD10-I70.0).   Electronically Signed By: Lavonia Dana M.D. On: 04/13/2020 18:21  CT ANGIOGRAPHY CHEST WITH CONTRAST  TECHNIQUE: Multidetector CT imaging of the chest was performed using the standard protocol during bolus administration of intravenous contrast. Multiplanar CT image reconstructions and MIPs were obtained to evaluate the vascular anatomy.  CONTRAST: 22mL OMNIPAQUE IOHEXOL 350 MG/ML SOLN  COMPARISON: None.  FINDINGS: Cardiovascular: No filling defects in the pulmonary arteries to suggest pulmonary emboli. Cardiomegaly. Coronary artery and aortic calcifications. No aneurysm.  Mediastinum/Nodes: No mediastinal, hilar, or axillary adenopathy. Trachea and esophagus are unremarkable.  Lungs/Pleura: Bibasilar atelectasis. No  confluent opacities or effusions.  Upper Abdomen: Imaging into the upper abdomen demonstrates no acute findings.  Musculoskeletal: Chest wall soft tissues are unremarkable. No acute bony abnormality.  Review of the MIP images confirms the above findings.  IMPRESSION: No evidence of pulmonary embolus.  Cardiomegaly, coronary artery disease.  Bibasilar atelectasis.  Aortic Atherosclerosis (ICD10-I70.0).   Electronically Signed By: Rolm Baptise M.D. On: 04/13/2020 18:19     Treatments: IV potassium.  IV fluids.  Discharge Exam: Blood pressure (!) 151/79, pulse 97, temperature 99.2 F (37.3 C), temperature source Axillary, resp. rate 18, height 5\' 3"  (1.6 m), weight 98.1 kg, SpO2 97 %. General appearance: alert and cooperative Resp: clear to auscultation bilaterally Cardio: regular rate and rhythm, S1, S2 normal, no murmur, click, rub or gallop GI: soft, non-tender; bowel sounds normal; no masses,  no organomegaly Extremities: extremities normal, atraumatic, no cyanosis or edema  Disposition: Discharge disposition: 01-Home or Self Care       Discharge Instructions    Diet general   Complete by: As directed    Dysphagia 1 diet   Increase activity slowly   Complete by: As directed      Allergies as of 04/16/2020      Reactions   Alendronate Nausea Only   Gi symptoms   Gluten Meal Nausea Only   Other reaction(s): NAUSEA Other reaction(s): NAUSEA Other reaction(s): NAUSEA   Lactose Intolerance (gi) Diarrhea   Nsaids    GI upset. Other reaction(s): Other (See Comments) Pt unsure   Other Other (See Comments)   Other reaction(s): OTHER   Oxycodone-acetaminophen    Oxycodone-acetaminophen Other (See Comments)   Other reaction(s): NAUSEA   Vicodin [hydrocodone-acetaminophen] Nausea Only, Diarrhea, Nausea And Vomiting   Wheat Bran Other (See Comments)   Sulfa Antibiotics Rash   Rash from steri strips   Tape Rash   Rash from steri strips       Medication List    TAKE these medications   ALPRAZolam 0.5 MG tablet Commonly known as: XANAX TAKE ONE TABLET BY MOUTH EVERY 4 HOURS AS NEEDED What changed:   reasons to take this  additional instructions   aspirin 81 MG EC tablet Take 1 tablet (81 mg total) by mouth daily. Swallow whole.   atorvastatin 20 MG tablet Commonly known as: Lipitor Take 1 tablet (20 mg total) by mouth daily.  carbidopa-levodopa 25-100 MG tablet Commonly known as: SINEMET IR Take 2 tablets by mouth 3 (three) times daily. What changed: how much to take   carvedilol 3.125 MG tablet Commonly known as: COREG Take 1 tablet (3.125 mg total) by mouth 2 (two) times daily with a meal.   ondansetron 4 MG disintegrating tablet Commonly known as: ZOFRAN-ODT Take 1 tablet (4 mg total) by mouth every 8 (eight) hours as needed for nausea or vomiting.   pantoprazole 40 MG tablet Commonly known as: Protonix Take 1 tablet (40 mg total) by mouth daily.   traMADol 50 MG tablet Commonly known as: ULTRAM TAKE ONE TABLET BY MOUTH THREE TIMES A DAY AS NEEDED What changed:   how much to take  when to take this   vitamin B-12 1000 MCG tablet Commonly known as: CYANOCOBALAMIN Take 1 tablet (1,000 mcg total) by mouth daily.       Follow-up Information    Jerrol Banana., MD On 04/21/2020.   Specialty: Family Medicine Why: @ 4pm Contact information: [address] [place] [address] [phone]        Nelva Bush, MD On 04/22/2020.   Specialty: Cardiology Why: @ 10am Contact information: [place] [address] [phone]        Jonathon Bellows, MD In 2 weeks.   Specialty: Gastroenterology Why: Patient will need to make a follow up appointment. Contact information: [place] [address] [phone]               Signed: Sharen Hones 04/16/2020, 10:18 AM

## 2020-04-16 NOTE — NC FL2 (Signed)
[place] LEVEL OF CARE SCREENING TOOL     IDENTIFICATION  Patient Name: Terri Perez Birthdate: [birth year] Sex: female Admission Date (Current Location): 04/13/2020  [place] and [place] Number:  Engineering geologist and Address:  [facility], [address] [place], [place] [address]      Provider Number: [id number]  Attending Physician Name and Address:  Sharen Hones, MD  Relative Name and Phone Number:  Taunja Brickner Husband [phone]    Current Level of Care: Hospital Recommended Level of Care: [place] Prior Approval Number:    Date Approved/Denied:   PASRR Number: [id number]  Discharge Plan: SNF    Current Diagnoses: Patient Active Problem List   Diagnosis Date Noted  . Elevated troponin   . Intractable vomiting with nausea   . Hypokalemia 04/13/2020  . Dehydration 04/13/2020  . Syncope and collapse 04/13/2020  . Acute metabolic encephalopathy 25/05/3974  . AKI (acute kidney injury) ([place]) 04/13/2020  . 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 Fracture of sacrum ([place]) 08/04/2016  . Adiposity 05/05/2016  . Parkinson disease ([place]) 04/25/2016  . 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 Bilateral lower extremity edema 07/23/2015  . 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 Chronic kidney disease (CKD), stage III (moderate) ([place]) 05/11/2015  . Elevated rheumatoid factor 05/11/2015  . Primary osteoarthritis of both hips 05/11/2015  . Syncope 04/24/2015  . History of operative procedure on hip 04/24/2015  . Restless leg 04/24/2015  . Osteoporosis 03/30/2015  . Insomnia 03/30/2015  . Skin cancer 03/30/2015  . MDD (major depressive disorder) 03/30/2015  . Migraine 03/30/2015  . GERD (gastroesophageal reflux disease) 03/30/2015  . IBS (irritable bowel syndrome) 03/30/2015  . OA (osteoarthritis) 03/30/2015  . Skin lesion of  chest wall 07/17/2014  . Closed fracture of shaft of femur ([place]) 04/01/2014  . Fracture of bone adjacent to prosthesis 04/01/2014  . Breast cancer ([place]) 03/07/2013  . Personal history of malignant neoplasm of large intestine   . Malignant neoplasm of upper-outer quadrant of female breast ([place])   . Acute anxiety 06/11/2008  . CONSTIPATION, CHRONIC 06/11/2008  . Fibromyalgia 06/11/2008    Orientation RESPIRATION BLADDER Height & Weight     Self,Time,Situation,Place  Normal Continent Weight: 98.1 kg Height:  5\' 3"  (160 cm)  BEHAVIORAL SYMPTOMS/MOOD NEUROLOGICAL BOWEL NUTRITION STATUS      Continent    AMBULATORY STATUS COMMUNICATION OF NEEDS Skin   Extensive Assist Verbally Normal                       Personal Care Assistance Level of Assistance  Bathing,Feeding,Dressing Bathing Assistance: Limited assistance Feeding assistance: Independent Dressing Assistance: Limited assistance     Functional Limitations Info  Sight,Hearing,Speech Sight Info: Adequate Hearing Info: Adequate Speech Info: Adequate    SPECIAL CARE FACTORS FREQUENCY  PT (By licensed PT),OT (By licensed OT)     PT Frequency: 5x week OT Frequency: 5x week            Contractures Contractures Info: Not present    Additional Factors Info  Code Status,Allergies Code Status Info: DNR Allergies Info: Nsaids, lactose intolerance, Alendronate, Oxycodone, Vicodin, wheat bran, tape, sulfa,gluten meal,           Current Medications (04/16/2020):  This is the current hospital active medication list Current Facility-Administered Medications  Medication Dose Route Frequency Provider Last Rate Last Admin  . acetaminophen (TYLENOL) tablet 650  mg  650 mg Oral Q6H PRN Toy Baker, MD   650 mg at 04/16/20 0622  . ALPRAZolam Duanne Moron) tablet 0.5 mg  0.5 mg Oral TID PRN Toy Baker, MD   0.5 mg at 04/16/20 0913  . aspirin EC tablet 81 mg  81 mg Oral Daily End, Christopher, MD   81 mg at 04/16/20  0907  . atorvastatin (LIPITOR) tablet 20 mg  20 mg Oral Daily Kate Sable, MD   20 mg at 04/16/20 0908  . calcium carbonate (TUMS - dosed in mg elemental calcium) chewable tablet 200 mg of elemental calcium  1 tablet Oral TID PRN Debbe Odea, MD   200 mg of elemental calcium at 04/14/20 1602  . carbidopa-levodopa (SINEMET IR) 25-100 MG per tablet immediate release 2 tablet  2 tablet Oral QID Debbe Odea, MD   2 tablet at 04/16/20 1313  . carbidopa-levodopa (SINEMET IR) 25-100 MG per tablet immediate release 2 tablet  2 tablet Oral QHS Debbe Odea, MD   2 tablet at 04/15/20 2052  . carvedilol (COREG) tablet 3.125 mg  3.125 mg Oral BID WC Doutova, Anastassia, MD   3.125 mg at 04/16/20 0900  . feeding supplement (BOOST / RESOURCE BREEZE) liquid 1 Container  1 Container Oral TID BM Debbe Odea, MD   1 Container at 04/16/20 1313  . haloperidol lactate (HALDOL) injection 2 mg  2 mg Intravenous Q2H PRN Athena Masse, MD   2 mg at 04/14/20 2157  . multivitamin with minerals tablet 1 tablet  1 tablet Oral Daily Debbe Odea, MD   1 tablet at 04/16/20 0908  . potassium & sodium phosphates (PHOS-NAK) 280-160-250 MG packet 1 packet  1 packet Oral TID WC & HS Sharen Hones, MD         Discharge Medications: Please see discharge summary for a list of discharge medications.  Relevant Imaging Results:  Relevant Lab Results:   Additional Information [id number]  Kerin Salen, RN

## 2020-04-16 NOTE — Evaluation (Signed)
Physical Therapy Evaluation Patient Details Name: Terri Perez MRN: [id number] DOB: 1945-10-22 Today's Date: 04/16/2020   History of Present Illness  Terri Perez is a 75 y.o. female with medical history significant of  Parkinson's disease, breast cancer, constipation, CKD stage3, Jerrye Bushy.  Clinical Impression  Pt is a pleasant 75 year old female who was admitted for syncope and collapse. Pt performs transfers with mod assist using RW and unable to ambulate at this time. Pt demonstrates deficits with strength/balance/endurance. Pt is very pleasant and motivated to participate, agreeable that she is far removed from baseline, not safe to dc home at this time. Would benefit from skilled PT to address above deficits and promote optimal return to PLOF; recommend transition to STR upon discharge from acute hospitalization.     Follow Up Recommendations SNF    Equipment Recommendations  None recommended by PT    Recommendations for Other Services       Precautions / Restrictions Precautions Precautions: Fall Restrictions Weight Bearing Restrictions: No      Mobility  Bed Mobility               General bed mobility comments: received in recliner at beginning and end of session    Transfers Overall transfer level: Needs assistance Equipment used: Rolling walker (2 wheeled) Transfers: Sit to/from Stand Sit to Stand: Mod assist         General transfer comment: needs assist for hand placement prior to attempt. Needs ant translation to improve balance to move hands to RW. Once standing able to stand for approx 20 secs prior to dizziness and requesting to return seated. Decreased eccentric control.  Ambulation/Gait             General Gait Details: unable with +1 assist at this time and dizziness limiting further mobility.  Stairs            Wheelchair Mobility    Modified Rankin (Stroke Patients Only)       Balance Overall balance assessment:  Needs assistance Sitting-balance support: Bilateral upper extremity supported Sitting balance-Leahy Scale: Good     Standing balance support: Bilateral upper extremity supported Standing balance-Leahy Scale: Fair                               Pertinent Vitals/Pain Pain Assessment: No/denies pain    Home Living Family/patient expects to be discharged to:: Private residence Living Arrangements: Spouse/significant other Available Help at Discharge: Available 24 hours/day Type of Home: House Home Access: Ramped entrance     Home Layout: One level Home Equipment: Environmental consultant - 2 wheels;Bedside commode;Shower seat;Wheelchair - manual;Hand held shower head;Hospital bed Additional Comments: Pt sleeps in lift chair per husband report.    Prior Function Level of Independence: Needs assistance   Gait / Transfers Assistance Needed: Pt ambulates with RW and husband providing wheelchair follow secondary to multiple falls at homes. Use of wheelchair to get pt out of the home and to car.  ADL's / Homemaking Assistance Needed: Pt sits in shower seat with husband assisting. Pt needing mod - max A for dressing and toileting needs.  Comments: Pt is not  reliable historian. Husband present during this evaluation.     Hand Dominance        Extremity/Trunk Assessment   Upper Extremity Assessment Upper Extremity Assessment: Generalized weakness (B UE grossly 4/5)    Lower Extremity Assessment Lower Extremity Assessment: Generalized weakness (B LE grossly  3-/5)       Communication   Communication: No difficulties  Cognition Arousal/Alertness: Awake/alert Behavior During Therapy: WFL for tasks assessed/performed Overall Cognitive Status: History of cognitive impairments - at baseline                                        General Comments      Exercises Other Exercises Other Exercises: seated ther-ex performed on B LE including LAQ, hip abd/add, hip add  squeezes, quad sets, and heel slides. All ther-ex performed x 10 reps with cga and safe technique. Other Exercises: several sit<>Stands performed with cues for sequencing and improved eccentric control. RW used.   Assessment/Plan    PT Assessment Patient needs continued PT services  PT Problem List Decreased strength;Decreased activity tolerance;Decreased balance;Decreased mobility;Decreased safety awareness       PT Treatment Interventions Gait training;Therapeutic exercise;Balance training;DME instruction    PT Goals (Current goals can be found in the Care Plan section)  Acute Rehab PT Goals Patient Stated Goal: I want to go to rehab now PT Goal Formulation: With patient Time For Goal Achievement: 04/30/20 Potential to Achieve Goals: Good    Frequency Min 2X/week   Barriers to discharge        Co-evaluation               AM-PAC PT "6 Clicks" Mobility  Outcome Measure Help needed turning from your back to your side while in a flat bed without using bedrails?: A Lot Help needed moving from lying on your back to sitting on the side of a flat bed without using bedrails?: A Lot Help needed moving to and from a bed to a chair (including a wheelchair)?: A Lot Help needed standing up from a chair using your arms (e.g., wheelchair or bedside chair)?: A Lot Help needed to walk in hospital room?: Total Help needed climbing 3-5 steps with a railing? : Total 6 Click Score: 10    End of Session Equipment Utilized During Treatment: Gait belt Activity Tolerance: Patient tolerated treatment well Patient left: in chair;with chair alarm set Nurse Communication: Mobility status PT Visit Diagnosis: Unsteadiness on feet (R26.81);Muscle weakness (generalized) (M62.81);History of falling (Z91.81);Difficulty in walking, not elsewhere classified (R26.2)    Time: [id number] PT Time Calculation (min) (ACUTE ONLY): 25 min   Charges:   PT Evaluation $PT Eval Low Complexity: 1 Low PT  Treatments $Therapeutic Exercise: 8-22 mins        Greggory Stallion, PT, DPT [phone]   Kelsa Jaworowski 04/16/2020, 3:13 PM

## 2020-04-16 NOTE — Progress Notes (Signed)
Mobility Specialist - Progress Note   04/16/20 1300  Mobility  Activity Transferred to/from [facility];Transferred:  Bed to chair  Level of Assistance Maximum assist, patient does 25-49%  Assistive Device Front wheel walker  Distance Ambulated (ft) 2 ft  Mobility Response Tolerated well  Mobility performed by Mobility specialist  $Mobility charge 1 Mobility    Pt requesting to get to [facility] upon arrival. RN present throughout session. Pt gets EOB with full support for LE. Pt transfers to [facility] with +2 assist and SPT. Blocking method in use to prevent feet from sliding. Mobility assists with sliding pt's foot for pivotal sitting. Husband at bedside to view demonstration. Very lengthy discussion with pt and spouse on safe transfers/mobility while at home to prevent further decline. Pt and husband then agreeable to PT eval & recommendations prior to d/c, in which pt's been declining since admission. Pt able to stand from [facility] (x2). However, fatigues quickly. On second STS, mobility placed recliner behind pt for sitting. Pt expresses great comfort in recliner. Chair alarm set.    Kathee Delton Mobility Specialist 04/16/20, 2:00 PM

## 2020-04-16 NOTE — TOC Transition Note (Signed)
Transition of Care [facility]) - CM/SW Discharge Note   Patient Details  Name: Terri Perez MRN: [id number] Date of Birth: 1945-06-25  Transition of Care [facility]) CM/SW Contact:  Kerin Salen, RN Phone Number: 04/16/2020, 12:15 PM   Clinical Narrative:  Patient alert and oriented x3, discussed discharge plans and the need for Home Health PT/OT services in length. Patient husband refused then patient refused, they both agrees that the services will not help and was tried in the past. Husband says they only need Aide services, list given to patient 04/15/20, patient states he will make arrangements. Patient and husband advised to consult with PCP if they decide to change their minds about [facility] services.     Final next level of care: Home/Self Care Barriers to Discharge: Barriers Unresolved (comment) (Husband and Patient refuse Home Health services.)   Patient Goals and CMS Choice Patient states their goals for this hospitalization and ongoing recovery are:: To return home, without [place] per OT recommendations.   Choice offered to / list presented to : Spouse (Husband request Aide services, list given.)  Discharge Placement                  Name of family member notified: [name], husband Patient and family notified of of transfer: 04/16/20  Discharge Plan and Services In-house Referral: Clinical Social Work   Post Acute Care Choice: NA          DME Arranged: N/A DME Agency: NA       HH Arranged: Refused [place] Agency: [place] ([place]) Date [place]: 04/15/20 Time [place]: 1143 Representative spoke with at [place]: [place] (SDOH) Interventions     Readmission Risk Interventions No flowsheet data found.

## 2020-04-17 DIAGNOSIS — E876 Hypokalemia: Secondary | ICD-10-CM

## 2020-04-17 LAB — BASIC METABOLIC PANEL
Anion gap: 7 (ref 5–15)
BUN: 24 mg/dL — ABNORMAL HIGH (ref 8–23)
CO2: 24 mmol/L (ref 22–32)
Calcium: 8.5 mg/dL — ABNORMAL LOW (ref 8.9–10.3)
Chloride: 108 mmol/L (ref 98–111)
Creatinine, Ser: 0.95 mg/dL (ref 0.44–1.00)
GFR, Estimated: >60 mL/min (ref >60)
Glucose, Bld: 96 mg/dL (ref 70–99)
Potassium: 3.8 mmol/L (ref 3.5–5.1)
Sodium: 139 mmol/L (ref 135–145)

## 2020-04-17 LAB — MAGNESIUM: Magnesium: 1.7 mg/dL (ref 1.7–2.4)

## 2020-04-17 LAB — PHOSPHORUS: Phosphorus: 2.9 mg/dL (ref 2.5–4.6)

## 2020-04-17 MED ORDER — ENOXAPARIN SODIUM 60 MG/0.6ML ~~LOC~~ SOLN
0.5000 mg/kg | SUBCUTANEOUS | Status: DC
  Administered 2020-04-17 – 2020-04-19 (×3): 47.5 mg via SUBCUTANEOUS
  Filled 2020-04-17 (×3): qty 0.6

## 2020-04-17 MED ORDER — PANTOPRAZOLE SODIUM 40 MG PO TBEC
40.0000 mg | DELAYED_RELEASE_TABLET | Freq: Every day | ORAL | Status: DC
  Administered 2020-04-17 – 2020-04-21 (×4): 40 mg via ORAL
  Filled 2020-04-17 (×5): qty 1

## 2020-04-17 MED ORDER — VITAMIN B-12 1000 MCG PO TABS
1000.0000 ug | ORAL_TABLET | Freq: Every day | ORAL | Status: DC
  Administered 2020-04-17 – 2020-04-21 (×4): 1000 ug via ORAL
  Filled 2020-04-17 (×5): qty 1

## 2020-04-17 NOTE — Progress Notes (Signed)
PROGRESS NOTE    Terri Perez  DXI:[id number] DOB: May 05, 1945 DOA: 04/13/2020 PCP: Jerrol Banana., MD    Brief Narrative:  Terri Perez a 75 year old female who lives with her husband and has a medical h/oCKD 3B, anxiety, Parkinson's disease masked facies,obesity, chronic constipation, rheumatoid arthritis with positive rheumatoid factor, fibromyalgia, osteoporosis with multiple fractures, breast cancer in remissionwho presents to the hospital after a fall. Itappears that patient had a recurrent falls in the past, she had multiple old fractures of on the ribs and spine. 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 She also had some dysphagia, barium esophagram showed esophageal spasm without stenosis. I have scheduled the patient to see gastroenterology as outpatient. Patient was initially planning to be discharged home with home care, however, husband request patient to be transferred to nursing home.   Assessment & Plan:   Principal Problem:   Syncope and collapse Active Problems:   Acute anxiety   Syncope   Chronic kidney disease (CKD), stage III (moderate) (HCC)   Parkinson disease (HCC)   Hypokalemia   Dehydration   Acute metabolic encephalopathy   AKI (acute kidney injury) (HCC)   Intractable vomiting with nausea   Elevated troponin  #1.  Syncope and collapse. Mild elevation troponin secondary to demand ischemia. Parkinson disease. Patient syncope was due to autonomic dysfunction and dehydration.  Condition had improved, patient pending for SNF placement.  2.  Hypokalemia and hypophosphatemia Acute kidney injury Patient received multiple doses of IV supplements, condition has finally improved. Renal function also improved  3.  Weight loss. Esophageal spasm. Anorexia. Patient  will need outpatient EGD. Appetite seem to be improving. Continue protein supplements.  4.  Acute encephalopathy.  Vitamin B12 deficiency. Continue oral B12 supplements.    DVT prophylaxis: Lovenox Code Status: DNR Family Communication:  Disposition Plan:  .   Status is: Inpatient  Remains inpatient appropriate because:Inpatient level of care appropriate due to severity of illness   Dispo: The patient is from: Home              Anticipated d/c is to: SNF              Patient currently is medically stable to d/c.   Difficult to place patient No        I/O last 3 completed shifts: In: 2049.3 [P.O.:840; I.V.:1209.3] Out: 1100 [Urine:1100] No intake/output data recorded.     Consultants:   None  Procedures: None  Antimicrobials: None  Subjective: Patient doing well today, she does not have any confusion.  Her appetite is gradually getting better, no nausea vomiting. Denies any short of breath or cough. No chest pain palpitation. No dysuria hematuria. No fever or chills.  Objective: Vitals:   04/16/20 0600 04/16/20 0800 04/17/20 0431 04/17/20 0745  BP: (!) 152/84 (!) 151/79 (!) 126/50 (!) 109/54  Pulse: 94 97 85 86  Resp: 20 18 18 16   Temp: 97.7 F (36.5 C) 99.2 F (37.3 C) 98.6 F (37 C) 98.9 F (37.2 C)  TempSrc: Oral Axillary Oral Oral  SpO2: 95% 97% 94% 94%  Weight:   94.3 kg   Height:        Intake/Output Summary (Last 24 hours) at 04/17/2020 0921 Last data filed at 04/17/2020 0443 Gross per 24 hour  Intake 840 ml  Output 1100 ml  Net -260 ml   Filed Weights   04/14/20 0357 04/15/20 0749 04/17/20 0431  Weight: 91.1 kg 98.1 kg 94.3 kg    Examination:  General exam: Appears calm and comfortable  Respiratory system: Clear to auscultation. Respiratory effort normal. Cardiovascular system: S1 & S2 heard, RRR. No JVD, murmurs, rubs, gallops or clicks. No pedal edema. Gastrointestinal system: Abdomen is nondistended, soft and nontender. No  organomegaly or masses felt. Normal bowel sounds heard. Central nervous system: Alert and oriented x3. No focal neurological deficits. Extremities: Symmetric 5 x 5 power. Skin: No rashes, lesions or ulcers Psychiatry: Judgement and insight appear normal. Mood & affect appropriate.     Data Reviewed: I have personally reviewed following labs and imaging studies  CBC: Recent Labs  Lab 04/13/20 1613 04/14/20 0107  WBC 8.6 11.1*  NEUTROABS 6.7 8.7*  HGB 13.2 11.1*  HCT 39.4 34.8*  MCV 91.0 93.0  PLT 197 277   Basic Metabolic Panel: Recent Labs  Lab 04/13/20 1613 04/13/20 1618 04/13/20 2316 04/14/20 0107 04/14/20 1114 04/15/20 0848 04/16/20 0543 04/17/20 0752  NA 135  --  137 138  --  140 140 139  K 2.7*  --  2.9* 3.1* 3.6 3.1* 3.3* 3.8  CL 95*  --  101 102  --  103 107 108  CO2 29  --  28 31  --  26 25 24   GLUCOSE 142*  --  114* 104*  --  130* 100* 96  BUN 16  --  13 13  --  13 18 24*  CREATININE 1.45*  --  1.08* 1.00  --  1.10* 0.90 0.95  CALCIUM 11.1*  --  9.7 9.6  --  9.2 8.4* 8.5*  MG 1.8  --   --  1.6*  --  1.8 1.7 1.7  PHOS  --  1.7*  --  2.2*  --  2.0* 2.4* 2.9   GFR: Estimated Creatinine Clearance: 55.9 mL/min (by C-G formula based on SCr of 0.95 mg/dL). Liver Function Tests: Recent Labs  Lab 04/13/20 1613 04/14/20 0107  AST 15 12*  ALT 6 <5  ALKPHOS 75 59  BILITOT 1.0 0.8  PROT 7.4 5.9*  ALBUMIN 3.9 3.2*   No results for input(s): LIPASE, AMYLASE in the last 168 hours. Recent Labs  Lab 04/13/20 1939  AMMONIA 10   Coagulation Profile: Recent Labs  Lab 04/13/20 1613  INR 1.0   Cardiac Enzymes: Recent Labs  Lab 04/13/20 1915  CKTOTAL 81   BNP (last 3 results) No results for input(s): PROBNP in the last 8760 hours. HbA1C: No results for input(s): HGBA1C in the last 72 hours. CBG: Recent Labs  Lab 04/14/20 2058  GLUCAP 129*   Lipid Profile: Recent Labs    04/15/20 0848  CHOL 210*  HDL 76  LDLCALC 111*  TRIG 115  CHOLHDL 2.8    Thyroid Function Tests: No results for input(s): TSH, T4TOTAL, FREET4, T3FREE, THYROIDAB in the last 72 hours. Anemia Panel: Recent Labs    04/15/20 0848  VITAMINB12 505   Sepsis Labs: No results for input(s): PROCALCITON, LATICACIDVEN in the last 168 hours.  Recent Results (from the past 240 hour(s))  Resp Panel by RT-PCR (Flu A&B, Covid) Nasopharyngeal Swab     Status: None   Collection Time: 04/13/20  4:10 PM   Specimen: Nasopharyngeal Swab; Nasopharyngeal(NP) swabs in vial transport medium  Result Value Ref Range Status   SARS Coronavirus 2 by RT PCR NEGATIVE NEGATIVE Final  Comment: (NOTE) SARS-CoV-2 target nucleic acids are NOT DETECTED.  The SARS-CoV-2 RNA is generally detectable in upper respiratory specimens during the acute phase of infection. The lowest concentration of SARS-CoV-2 viral copies this assay can detect is 138 copies/mL. A negative result does not preclude SARS-Cov-2 infection and should not be used as the sole basis for treatment or other patient management decisions. A negative result may occur with  improper specimen collection/handling, submission of specimen other than nasopharyngeal swab, presence of viral mutation(s) within the areas targeted by this assay, and inadequate number of viral copies(<138 copies/mL). A negative result must be combined with clinical observations, patient history, and epidemiological information. The expected result is Negative.  Fact Sheet for Patients:  EntrepreneurPulse.com.au  Fact Sheet for Healthcare Providers:  IncredibleEmployment.be  This test is no t yet approved or cleared by the [place] FDA and  has been authorized for detection and/or diagnosis of SARS-CoV-2 by FDA under an Emergency Use Authorization (EUA). This EUA will remain  in effect (meaning this test can be used) for the duration of the COVID-19 declaration under Section 564(b)(1) of the Act,  21 U.S.C.section 360bbb-3(b)(1), unless the authorization is terminated  or revoked sooner.       Influenza A by PCR NEGATIVE NEGATIVE Final   Influenza B by PCR NEGATIVE NEGATIVE Final    Comment: (NOTE) The Xpert Xpress SARS-CoV-2/FLU/RSV plus assay is intended as an aid in the diagnosis of influenza from Nasopharyngeal swab specimens and should not be used as a sole basis for treatment. Nasal washings and aspirates are unacceptable for Xpert Xpress SARS-CoV-2/FLU/RSV testing.  Fact Sheet for Patients: EntrepreneurPulse.com.au  Fact Sheet for Healthcare Providers: IncredibleEmployment.be  This test is not yet approved or cleared by the [place] FDA and has been authorized for detection and/or diagnosis of SARS-CoV-2 by FDA under an Emergency Use Authorization (EUA). This EUA will remain in effect (meaning this test can be used) for the duration of the COVID-19 declaration under Section 564(b)(1) of the Act, 21 U.S.C. section 360bbb-3(b)(1), unless the authorization is terminated or revoked.  Performed at [facility], [address], [place], [place] [address]          Radiology Studies: No results found.      Scheduled Meds: . aspirin EC  81 mg Oral Daily  . atorvastatin  20 mg Oral Daily  . carbidopa-levodopa  2 tablet Oral QID  . carbidopa-levodopa  2 tablet Oral QHS  . carvedilol  3.125 mg Oral BID WC  . feeding supplement  1 Container Oral TID BM  . multivitamin with minerals  1 tablet Oral Daily  . potassium & sodium phosphates  1 packet Oral TID WC & HS   Continuous Infusions:   LOS: 3 days    Time spent: 27 minutes    Sharen Hones, MD [facility]   To contact the attending provider between 7A-7P or the covering provider during after hours 7P-7A, please log into the web site [email or web address] and access using universal [place] password for that web site. If you do not have the  password, please call the hospital operator.  04/17/2020, 9:21 AM

## 2020-04-17 NOTE — TOC Progression Note (Signed)
Transition of Care [facility]) - Progression Note    Patient Details  Name: SHAMILA LERCH MRN: [id number] Date of Birth: [birth year]  Transition of Care [facility]) CM/SW [place], RN Phone Number: 04/17/2020, 10:42 AM  Clinical Narrative:   [name] accepted patient for SNF Rehab services. Spoke with patient's husband, who accepts offer, Research officer, political party notified and says she can offer bed on Monday.    Expected Discharge Plan: [place] Barriers to Discharge: Barriers Unresolved (comment) (Husband and Patient refuse Home Health services.)  Expected Discharge Plan and Services Expected Discharge Plan: [place] In-house Referral: Clinical Social Work   Post Acute Care Choice: NA Living arrangements for the past 2 months: [employer] Expected Discharge Date: 04/16/20               DME Arranged: N/A DME Agency: NA       HH Arranged: Refused [place] Agency: [place] ([place]) Date [place]: 04/15/20 Time [place]: 1143 Representative spoke with at [place]: [place] (SDOH) Interventions    Readmission Risk Interventions No flowsheet data found.

## 2020-04-17 NOTE — Care Management Important Message (Signed)
Important Message  Patient Details  Name: MAKAYLE KRAHN MRN: [id number] Date of Birth: 1945/04/04   Medicare Important Message Given:  Yes     Dannette Barbara 04/17/2020, 12:33 PM

## 2020-04-17 NOTE — Progress Notes (Signed)
PHARMACIST - PHYSICIAN COMMUNICATION  CONCERNING:  Enoxaparin (Lovenox) for DVT Prophylaxis    RECOMMENDATION: Patient was prescribed enoxaprin 40mg  q24 hours for VTE prophylaxis.   Filed Weights   04/14/20 0357 04/15/20 0749 04/17/20 0431  Weight: 91.1 kg (200 lb 14.4 oz) 98.1 kg (216 lb 4.3 oz) 94.3 kg (207 lb 14.3 oz)    Body mass index is 36.83 kg/m.  Estimated Creatinine Clearance: 55.9 mL/min (by C-G formula based on SCr of 0.95 mg/dL).   Based on [place] patient is candidate for enoxaparin 0.5mg /kg TBW SQ every 24 hours based on BMI being >30.   DESCRIPTION: Pharmacy has adjusted enoxaparin dose per [facility] policy.  Patient is now receiving enoxaparin 47.5 mg every 24 hours    Slaughter,Myra, PharmD Clinical Pharmacist  04/17/2020 9:55 AM

## 2020-04-18 LAB — CBC
HCT: 32.4 % — ABNORMAL LOW (ref 36.0–46.0)
Hemoglobin: 10.5 g/dL — ABNORMAL LOW (ref 12.0–15.0)
MCH: 30 pg (ref 26.0–34.0)
MCHC: 32.4 g/dL (ref 30.0–36.0)
MCV: 92.6 fL (ref 80.0–100.0)
Platelets: 175 10*3/uL (ref 150–400)
RBC: 3.5 MIL/uL — ABNORMAL LOW (ref 3.87–5.11)
RDW: 16 % — ABNORMAL HIGH (ref 11.5–15.5)
WBC: 8.4 10*3/uL (ref 4.0–10.5)
nRBC: 0 % (ref 0.0–0.2)

## 2020-04-18 NOTE — Progress Notes (Addendum)
Physical Therapy Treatment Patient Details Name: Terri Perez MRN: [id number] DOB: 1945-11-29 Today's Date: 04/18/2020    History of Present Illness Terri Perez is a 75 y.o. female with medical history significant of  Parkinson's disease, (L?) breast cancer, constipation, CKD stage3, Gerd.    PT Comments    Pt received in recliner & agreeable to PT session, eager to return to bed. Pt requires max cuing & max +2 assist to scoot to edge of chair despite cuing for lateral leans & scooting hips forward. Pt is able to stand with mod assist +1 & complete stand pivot with min assist. Pt is able to return to standing & take side steps at EOB with decreased weight shift to R (reports old R femur fx) before returning to sitting & requiring mod assist for BLE for sit>supine. Pt performs BLE strengthening exercises with cuing for technique & PT educated pt on need to sit upright if wanting to consume liquids later today (pt currently at 30 degrees HOB).  Pt does decline further ambulation during session, noting some dizziness, "not feeling right in the head" with minimal transfers & side steps at EOB during session. Pt also notes blurry vision R eye but states this has happened before - nurse made aware - and assisted pt to supine after mobility.  Pt would benefit from STR upon d/c to maximize independence with functional mobility, reduce fall risk, & decrease caregiver burden prior to return home.  BP at beginning of session: 103/58 mmHg (RUE)   Follow Up Recommendations  SNF     Equipment Recommendations  None recommended by PT    Recommendations for Other Services       Precautions / Restrictions Precautions Precautions: Fall Restrictions Weight Bearing Restrictions: No    Mobility  Bed Mobility Overal bed mobility: Needs Assistance Bed Mobility: Rolling Rolling: Mod assist (MAX cuing & assist to grab bed rail & to position LE to assist with rolling)     Sit to supine:  Mod assist;HOB elevated   General bed mobility comments: assistance to elevate BLE onto bed sit>supine    Transfers Overall transfer level: Needs assistance Equipment used: Rolling walker (2 wheeled) Transfers: Sit to/from [facility] Sit to Stand: Mod assist Stand pivot transfers: Min assist       General transfer comment: Cuing to scoot to edge of seat but pt unable & requires max assist +2 before initiating sit>stand, max cuing for safe hand placement for sit>stand, assistance with RLE placement underneath BOS, assistance & cuing for anterior weight shifting sit>stand  Ambulation/Gait Ambulation/Gait assistance: Min assist Gait Distance (Feet): 3 Feet Assistive device: Rolling walker (2 wheeled) Gait Pattern/deviations: Decreased step length - right;Decreased step length - left;Decreased stance time - right;Decreased stride length;Decreased dorsiflexion - right;Decreased dorsiflexion - left;Decreased weight shift to right Gait velocity: significantly decreased   General Gait Details: 3 steps to L at [employer]   Stairs             Wheelchair Mobility    Modified Rankin (Stroke Patients Only)       Balance Overall balance assessment: Needs assistance Sitting-balance support: Bilateral upper extremity supported;Feet supported Sitting balance-Leahy Scale: Good Sitting balance - Comments: supervision sitting EOB   Standing balance support: Bilateral upper extremity supported Standing balance-Leahy Scale: Fair Standing balance comment: requires BUE support for standing  Cognition Arousal/Alertness: Awake/alert Behavior During Therapy: WFL for tasks assessed/performed Overall Cognitive Status: History of cognitive impairments - at baseline                                 General Comments: Slow processing, slower initiation of tasks Pt also states "I can't" when asked to attempt something - requiring  encouragement from PT to attempt first (ex: scooting to edge of chair)      Exercises General Exercises - Lower Extremity Heel Slides: AAROM;Strengthening;Both;10 reps;Supine Hip ABduction/ADduction: AAROM;Strengthening;Both;10 reps;Supine (hip abduction slides)    General Comments        Pertinent Vitals/Pain Pain Assessment: Faces Faces Pain Scale: Hurts a little bit Pain Location: RLE Pain Descriptors / Indicators: Discomfort Pain Intervention(s): Limited activity within patient's tolerance;Monitored during session    Home Living                      Prior Function            PT Goals (current goals can now be found in the care plan section) Acute Rehab PT Goals Patient Stated Goal: I want to go to rehab now PT Goal Formulation: With patient Time For Goal Achievement: 04/30/20 Potential to Achieve Goals: Good Progress towards PT goals: Progressing toward goals    Frequency    Min 2X/week      PT Plan Current plan remains appropriate    Co-evaluation              AM-PAC PT "6 Clicks" Mobility   Outcome Measure  Help needed turning from your back to your side while in a flat bed without using bedrails?: A Lot Help needed moving from lying on your back to sitting on the side of a flat bed without using bedrails?: A Lot Help needed moving to and from a bed to a chair (including a wheelchair)?: A Lot Help needed standing up from a chair using your arms (e.g., wheelchair or bedside chair)?: A Lot Help needed to walk in hospital room?: A Lot Help needed climbing 3-5 steps with a railing? : Total 6 Click Score: 11    End of Session Equipment Utilized During Treatment: Gait belt Activity Tolerance: Patient tolerated treatment well Patient left: in bed;with call bell/phone within reach;with bed alarm set Nurse Communication: Mobility status (c/o blurry vision R eye, dizziness with mobility) PT Visit Diagnosis: Unsteadiness on feet (R26.81);Muscle  weakness (generalized) (M62.81);History of falling (Z91.81);Difficulty in walking, not elsewhere classified (R26.2)     Time: 4665-9935 PT Time Calculation (min) (ACUTE ONLY): 29 min  Charges:  $Therapeutic Activity: 23-37 mins                     Lavone Nian, PT, DPT 04/18/20, 4:34 PM    Waunita Schooner 04/18/2020, 4:29 PM

## 2020-04-18 NOTE — Plan of Care (Signed)
  Problem: Education: Goal: Knowledge of General Education information will improve Description: Including pain rating scale, medication(s)/side effects and non-pharmacologic comfort measures 04/18/2020 1846 by Cristela Blue, RN Outcome: Progressing 04/18/2020 1846 by Cristela Blue, RN Outcome: Progressing   Problem: Health Behavior/Discharge Planning: Goal: Ability to manage health-related needs will improve 04/18/2020 1846 by Cristela Blue, RN Outcome: Progressing 04/18/2020 1846 by Cristela Blue, RN Outcome: Progressing   Problem: Clinical Measurements: Goal: Ability to maintain clinical measurements within normal limits will improve 04/18/2020 1846 by Cristela Blue, RN Outcome: Progressing 04/18/2020 1846 by Cristela Blue, RN Outcome: Progressing Goal: Will remain free from infection 04/18/2020 1846 by Cristela Blue, RN Outcome: Progressing 04/18/2020 1846 by Cristela Blue, RN Outcome: Progressing Goal: Diagnostic test results will improve 04/18/2020 1846 by Cristela Blue, RN Outcome: Progressing 04/18/2020 1846 by Cristela Blue, RN Outcome: Progressing Goal: Respiratory complications will improve 04/18/2020 1846 by Cristela Blue, RN Outcome: Progressing 04/18/2020 1846 by Cristela Blue, RN Outcome: Progressing Goal: Cardiovascular complication will be avoided 04/18/2020 1846 by Cristela Blue, RN Outcome: Progressing 04/18/2020 1846 by Cristela Blue, RN Outcome: Progressing   Problem: Activity: Goal: Risk for activity intolerance will decrease 04/18/2020 1846 by Cristela Blue, RN Outcome: Progressing 04/18/2020 1846 by Cristela Blue, RN Outcome: Progressing   Problem: Nutrition: Goal: Adequate nutrition will be maintained 04/18/2020 1846 by Cristela Blue, RN Outcome: Progressing 04/18/2020 1846 by Cristela Blue, RN Outcome: Progressing   Problem: Coping: Goal: Level of anxiety will decrease 04/18/2020 1846 by Cristela Blue, RN Outcome:  Progressing 04/18/2020 1846 by Cristela Blue, RN Outcome: Progressing   Problem: Elimination: Goal: Will not experience complications related to bowel motility 04/18/2020 1846 by Cristela Blue, RN Outcome: Progressing 04/18/2020 1846 by Cristela Blue, RN Outcome: Progressing Goal: Will not experience complications related to urinary retention 04/18/2020 1846 by Cristela Blue, RN Outcome: Progressing 04/18/2020 1846 by Cristela Blue, RN Outcome: Progressing   Problem: Pain Managment: Goal: General experience of comfort will improve 04/18/2020 1846 by Cristela Blue, RN Outcome: Progressing 04/18/2020 1846 by Cristela Blue, RN Outcome: Progressing   Problem: Safety: Goal: Ability to remain free from injury will improve 04/18/2020 1846 by Cristela Blue, RN Outcome: Progressing 04/18/2020 1846 by Cristela Blue, RN Outcome: Progressing   Problem: Skin Integrity: Goal: Risk for impaired skin integrity will decrease 04/18/2020 1846 by Cristela Blue, RN Outcome: Progressing 04/18/2020 1846 by Cristela Blue, RN Outcome: Progressing

## 2020-04-18 NOTE — Progress Notes (Signed)
SLP Cancellation Note  Patient Details Name: Terri Perez MRN: [id number] DOB: November 29, 1945   Cancelled treatment:       Reason Eval/Treat Not Completed:  (chart reviewed; consulted NSG). Per chart notes and NSG report, pt is tolerating current diet w/ adequate oropharyngeal swallowing. During this admit, Barium esophagram showed esophageal spasm without stenosis. She has been scheduled to see Gastroenterology as Outpatient for f/u and management. ST services will sign off at this time w/ NSG to reconsult if any new swallowing issues arise while admitted. Recommend general aspiration and Reflux precautions w/ oral intake.     Orinda Kenner, MS, CCC-SLP Speech Language Pathologist Rehab Services [phone] [facility] 04/18/2020, 2:55 PM

## 2020-04-18 NOTE — Progress Notes (Signed)
PROGRESS NOTE    Terri Perez  YSA:[id number] DOB: 07/06/1945 DOA: 04/13/2020 PCP: Jerrol Banana., MD    Brief Narrative:  Terri Perez a 75 year old female who lives with her husband and has a medical h/oCKD 3B, anxiety, Parkinson's disease,obesity, chronic constipation, rheumatoid arthritis with positive rheumatoid factor, fibromyalgia, osteoporosis with multiple fractures, breast cancer in remissionwho presents to the hospital after a fall. Itappears that patient had a recurrent falls in the past, she had multiple old fractures of on the ribs and spine. 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 She also had some dysphagia, barium esophagram showed esophageal spasm without stenosis. I have scheduled the patient to see gastroenterology as outpatient. Patient was initially planning to be discharged home with home care, however, husband request patient to be transferred to nursing home.   Assessment & Plan:   Principal Problem:   Syncope and collapse Active Problems:   Acute anxiety   Syncope   Chronic kidney disease (CKD), stage III (moderate) (HCC)   Parkinson disease (HCC)   Hypokalemia   Dehydration   Acute metabolic encephalopathy   AKI (acute kidney injury) (HCC)   Intractable vomiting with nausea   Elevated troponin  #1.  Syncope and collapse. Mild elevation troponin secondary to demand ischemia. Parkinson disease. Condition has improved.  No dehydration.  2.  Hypokalemia and hypophosphatemia Acute kidney injury. Condition improved, recheck labs tomorrow.  #3.  Weight loss. Anorexia, esophageal spasm. Patient doing well, appetite improving.  We will schedule outpatient GI follow-up.  4.  Acute metabolic encephalopathy. Vitamin B12 deficient anemia. Continue current B12  supplement.   DVT prophylaxis: Lovenox Code Status: DNR Family Communication:  Disposition Plan:  .   Status is: Inpatient  Remains inpatient appropriate because:Unsafe d/c plan   Dispo: The patient is from: Home              Anticipated d/c is to: SNF              Patient currently is medically stable to d/c.   Difficult to place patient No        I/O last 3 completed shifts: In: -  Out: 1800 [Urine:1800] No intake/output data recorded.     Consultants:   None  Procedures: None  Antimicrobials: None  Subjective: Patient doing well today, she has no confusion.  She slept well last night. Appetite has been improving, no nausea vomiting abdominal pain.  She states that she had 2 large bowel movements yesterday. No fever chills  No dysuria hematuria.  Objective: Vitals:   04/17/20 1643 04/17/20 2031 04/18/20 0427 04/18/20 0741  BP: 138/73 (!) 154/75 (!) 143/72 (!) 154/71  Pulse: 87 90 91 91  Resp: 18 18 18 17   Temp: 98.7 F (37.1 C) 98.7 F (37.1 C) 98.5 F (36.9 C) 99.2 F (37.3 C)  TempSrc: Oral Oral Oral Oral  SpO2: 96% 95% 93% 93%  Weight:      Height:        Intake/Output Summary (Last 24 hours) at 04/18/2020 0945 Last data filed at 04/18/2020 0400 Gross per 24 hour  Intake -  Output 900 ml  Net -900 ml   Filed Weights   04/14/20 0357 04/15/20 0749 04/17/20 0431  Weight: 91.1 kg 98.1 kg 94.3 kg    Examination:  General exam: Appears calm and  comfortable  Respiratory system: Clear to auscultation. Respiratory effort normal. Cardiovascular system: S1 & S2 heard, RRR. No JVD, murmurs, rubs, gallops or clicks. No pedal edema. Gastrointestinal system: Abdomen is nondistended, soft and nontender. No organomegaly or masses felt. Normal bowel sounds heard. Central nervous system: Alert and oriented x3. No focal neurological deficits. Extremities: Symmetric 5 x 5 power. Skin: No rashes, lesions or ulcers Psychiatry: Judgement and insight  appear normal. Mood & affect appropriate.     Data Reviewed: I have personally reviewed following labs and imaging studies  CBC: Recent Labs  Lab 04/13/20 1613 04/14/20 0107 04/18/20 0422  WBC 8.6 11.1* 8.4  NEUTROABS 6.7 8.7*  --   HGB 13.2 11.1* 10.5*  HCT 39.4 34.8* 32.4*  MCV 91.0 93.0 92.6  PLT 197 168 161   Basic Metabolic Panel: Recent Labs  Lab 04/13/20 1613 04/13/20 1618 04/13/20 2316 04/14/20 0107 04/14/20 1114 04/15/20 0848 04/16/20 0543 04/17/20 0752  NA 135  --  137 138  --  140 140 139  K 2.7*  --  2.9* 3.1* 3.6 3.1* 3.3* 3.8  CL 95*  --  101 102  --  103 107 108  CO2 29  --  28 31  --  26 25 24   GLUCOSE 142*  --  114* 104*  --  130* 100* 96  BUN 16  --  13 13  --  13 18 24*  CREATININE 1.45*  --  1.08* 1.00  --  1.10* 0.90 0.95  CALCIUM 11.1*  --  9.7 9.6  --  9.2 8.4* 8.5*  MG 1.8  --   --  1.6*  --  1.8 1.7 1.7  PHOS  --  1.7*  --  2.2*  --  2.0* 2.4* 2.9   GFR: Estimated Creatinine Clearance: 55.9 mL/min (by C-G formula based on SCr of 0.95 mg/dL). Liver Function Tests: Recent Labs  Lab 04/13/20 1613 04/14/20 0107  AST 15 12*  ALT 6 <5  ALKPHOS 75 59  BILITOT 1.0 0.8  PROT 7.4 5.9*  ALBUMIN 3.9 3.2*   No results for input(s): LIPASE, AMYLASE in the last 168 hours. Recent Labs  Lab 04/13/20 1939  AMMONIA 10   Coagulation Profile: Recent Labs  Lab 04/13/20 1613  INR 1.0   Cardiac Enzymes: Recent Labs  Lab 04/13/20 1915  CKTOTAL 81   BNP (last 3 results) No results for input(s): PROBNP in the last 8760 hours. HbA1C: No results for input(s): HGBA1C in the last 72 hours. CBG: Recent Labs  Lab 04/14/20 2058  GLUCAP 129*   Lipid Profile: No results for input(s): CHOL, HDL, LDLCALC, TRIG, CHOLHDL, LDLDIRECT in the last 72 hours. Thyroid Function Tests: No results for input(s): TSH, T4TOTAL, FREET4, T3FREE, THYROIDAB in the last 72 hours. Anemia Panel: No results for input(s): VITAMINB12, FOLATE, FERRITIN, TIBC, IRON,  RETICCTPCT in the last 72 hours. Sepsis Labs: No results for input(s): PROCALCITON, LATICACIDVEN in the last 168 hours.  Recent Results (from the past 240 hour(s))  Resp Panel by RT-PCR (Flu A&B, Covid) Nasopharyngeal Swab     Status: None   Collection Time: 04/13/20  4:10 PM   Specimen: Nasopharyngeal Swab; Nasopharyngeal(NP) swabs in vial transport medium  Result Value Ref Range Status   SARS Coronavirus 2 by RT PCR NEGATIVE NEGATIVE Final    Comment: (NOTE) SARS-CoV-2 target nucleic acids are NOT DETECTED.  The SARS-CoV-2 RNA is generally detectable in upper respiratory specimens during the acute phase of infection. The lowest concentration  of SARS-CoV-2 viral copies this assay can detect is 138 copies/mL. A negative result does not preclude SARS-Cov-2 infection and should not be used as the sole basis for treatment or other patient management decisions. A negative result may occur with  improper specimen collection/handling, submission of specimen other than nasopharyngeal swab, presence of viral mutation(s) within the areas targeted by this assay, and inadequate number of viral copies(<138 copies/mL). A negative result must be combined with clinical observations, patient history, and epidemiological information. The expected result is Negative.  Fact Sheet for Patients:  EntrepreneurPulse.com.au  Fact Sheet for Healthcare Providers:  IncredibleEmployment.be  This test is no t yet approved or cleared by the [place] FDA and  has been authorized for detection and/or diagnosis of SARS-CoV-2 by FDA under an Emergency Use Authorization (EUA). This EUA will remain  in effect (meaning this test can be used) for the duration of the COVID-19 declaration under Section 564(b)(1) of the Act, 21 U.S.C.section 360bbb-3(b)(1), unless the authorization is terminated  or revoked sooner.       Influenza A by PCR NEGATIVE NEGATIVE Final   Influenza  B by PCR NEGATIVE NEGATIVE Final    Comment: (NOTE) The Xpert Xpress SARS-CoV-2/FLU/RSV plus assay is intended as an aid in the diagnosis of influenza from Nasopharyngeal swab specimens and should not be used as a sole basis for treatment. Nasal washings and aspirates are unacceptable for Xpert Xpress SARS-CoV-2/FLU/RSV testing.  Fact Sheet for Patients: EntrepreneurPulse.com.au  Fact Sheet for Healthcare Providers: IncredibleEmployment.be  This test is not yet approved or cleared by the [place] FDA and has been authorized for detection and/or diagnosis of SARS-CoV-2 by FDA under an Emergency Use Authorization (EUA). This EUA will remain in effect (meaning this test can be used) for the duration of the COVID-19 declaration under Section 564(b)(1) of the Act, 21 U.S.C. section 360bbb-3(b)(1), unless the authorization is terminated or revoked.  Performed at [facility], [address] [place], [place] [address]          Radiology Studies: No results found.      Scheduled Meds: . aspirin EC  81 mg Oral Daily  . atorvastatin  20 mg Oral Daily  . carbidopa-levodopa  2 tablet Oral QID  . carbidopa-levodopa  2 tablet Oral QHS  . carvedilol  3.125 mg Oral BID WC  . enoxaparin (LOVENOX) injection  0.5 mg/kg Subcutaneous Q24H  . feeding supplement  1 Container Oral TID BM  . multivitamin with minerals  1 tablet Oral Daily  . pantoprazole  40 mg Oral Daily  . vitamin B-12  1,000 mcg Oral Daily   Continuous Infusions:   LOS: 4 days    Time spent: 26 minutes    Sharen Hones, MD [facility]   To contact the attending provider between 7A-7P or the covering provider during after hours 7P-7A, please log into the web site [email or web address] and access using universal [place] password for that web site. If you do not have the password, please call the hospital operator.  04/18/2020, 9:45 AM

## 2020-04-19 DIAGNOSIS — D519 Vitamin B12 deficiency anemia, unspecified: Secondary | ICD-10-CM

## 2020-04-19 DIAGNOSIS — D513 Other dietary vitamin B12 deficiency anemia: Secondary | ICD-10-CM

## 2020-04-19 LAB — BASIC METABOLIC PANEL
Anion gap: 7 (ref 5–15)
BUN: 29 mg/dL — ABNORMAL HIGH (ref 8–23)
CO2: 22 mmol/L (ref 22–32)
Calcium: 8.8 mg/dL — ABNORMAL LOW (ref 8.9–10.3)
Chloride: 108 mmol/L (ref 98–111)
Creatinine, Ser: 0.99 mg/dL (ref 0.44–1.00)
GFR, Estimated: 59 mL/min — ABNORMAL LOW (ref >60)
Glucose, Bld: 99 mg/dL (ref 70–99)
Potassium: 4.1 mmol/L (ref 3.5–5.1)
Sodium: 137 mmol/L (ref 135–145)

## 2020-04-19 LAB — PHOSPHORUS: Phosphorus: 2.9 mg/dL (ref 2.5–4.6)

## 2020-04-19 LAB — MAGNESIUM: Magnesium: 1.6 mg/dL — ABNORMAL LOW (ref 1.7–2.4)

## 2020-04-19 MED ORDER — DICLOFENAC SODIUM 1 % EX GEL
4.0000 g | Freq: Four times a day (QID) | CUTANEOUS | Status: DC
  Administered 2020-04-19 – 2020-04-20 (×4): 4 g via TOPICAL
  Filled 2020-04-19: qty 100

## 2020-04-19 MED ORDER — ACETAMINOPHEN 500 MG PO TABS
1000.0000 mg | ORAL_TABLET | Freq: Four times a day (QID) | ORAL | Status: DC | PRN
  Administered 2020-04-19 – 2020-04-21 (×3): 1000 mg via ORAL
  Filled 2020-04-19 (×3): qty 2

## 2020-04-19 MED ORDER — MAGNESIUM SULFATE 2 GM/50ML IV SOLN
2.0000 g | Freq: Once | INTRAVENOUS | Status: AC
  Administered 2020-04-19: 2 g via INTRAVENOUS
  Filled 2020-04-19: qty 50

## 2020-04-19 MED ORDER — TRAMADOL HCL 50 MG PO TABS
50.0000 mg | ORAL_TABLET | Freq: Two times a day (BID) | ORAL | Status: DC | PRN
  Administered 2020-04-19: 50 mg via ORAL
  Filled 2020-04-19 (×2): qty 1

## 2020-04-19 MED ORDER — ENOXAPARIN SODIUM 60 MG/0.6ML ~~LOC~~ SOLN
0.5000 mg/kg | SUBCUTANEOUS | Status: DC
  Administered 2020-04-20 – 2020-04-21 (×2): 47.5 mg via SUBCUTANEOUS
  Filled 2020-04-19 (×2): qty 0.6

## 2020-04-19 NOTE — Progress Notes (Addendum)
Inpatient Rehab Admissions Coordinator Note:   Pt was screened for CIR candidacy by Clemens Catholic, [place] CCC-SLP. At this time, Pt. Has a bed offer for short term rehab at [facility]. I will not pursue for CIR.  Clemens Catholic, [place], [place] Admissions Coordinator  [phone] ([place]) [phone] (office)

## 2020-04-19 NOTE — Plan of Care (Signed)

## 2020-04-19 NOTE — Progress Notes (Signed)
PROGRESS NOTE    Terri Perez  HGD:[id number] DOB: Jun 14, 1945 DOA: 04/13/2020 PCP: Jerrol Banana., MD    Brief Narrative:  Terri Perez a 75 year old female who lives with her husband and has a medical h/oCKD 3B, anxiety, Parkinson's disease,obesity, chronic constipation, rheumatoid arthritis with positive rheumatoid factor, fibromyalgia, osteoporosis with multiple fractures, breast cancer in remissionwho presents to the hospital after a fall. Itappears that patient had a recurrent falls in the past, she had multiple old fractures of on the ribs and spine. 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 She also had some dysphagia, barium esophagram showed esophageal spasm without stenosis. I have scheduled the patient to see gastroenterology as outpatient. Patient was initially planning to be discharged home with home care, however, husband request patient to be transferred to nursing home.   Assessment & Plan:   Principal Problem:   Syncope and collapse Active Problems:   Acute anxiety   Syncope   Chronic kidney disease (CKD), stage III (moderate) (HCC)   Parkinson disease (HCC)   Hypokalemia   Dehydration   Acute metabolic encephalopathy   AKI (acute kidney injury) (HCC)   Intractable vomiting with nausea   Elevated troponin  #1.  Syncope and collapse. Demand ischemia with mild elevation troponin. Parkinson disease. Patient condition continued to be stable, good appetite, no additional dehydration.  2.  Weight loss.  Anorexia. Esophageal spasm. Patient be referred to GI as outpatient.  #3.  Hypomagnesemia Supplemented via IV.    DVT prophylaxis: Lovenox Code Status: DNR Family Communication:  Disposition Plan:  .   Status is: Inpatient  Remains inpatient appropriate  because:Unsafe d/c plan   Dispo: The patient is from: Home              Anticipated d/c is to: SNF              Patient currently is medically stable to d/c.   Difficult to place patient No        I/O last 3 completed shifts: In: 120 [P.O.:120] Out: 2201 [Urine:2200; Stool:1] No intake/output data recorded.     Consultants:   None  Procedures: None  Antimicrobials: None  Subjective: Patient doing well today.  She had some pain in her back, requests restart home dose tramadol. She has no confusion, no headaches. Denies any short of breath or cough. No abdominal pain nausea vomiting.  Last BM yesterday. No dysuria hematuria.  Objective: Vitals:   04/18/20 1136 04/18/20 1556 04/19/20 0359 04/19/20 0800  BP: (!) 91/46 (!) 103/58 135/62 134/74  Pulse: 84 85 88 94  Resp: 18 18 17 18   Temp: 98.4 F (36.9 C) 99.1 F (37.3 C) 98.6 F (37 C) 98.7 F (37.1 C)  TempSrc: Oral Oral Oral Oral  SpO2: 95% 97% 94% 94%  Weight:   96.2 kg   Height:        Intake/Output Summary (Last 24 hours) at 04/19/2020 0958 Last data filed at 04/19/2020 0200 Gross per 24 hour  Intake -  Output 1300 ml  Net -1300 ml   Filed Weights   04/15/20 0749 04/17/20 0431 04/19/20 0359  Weight: 98.1 kg 94.3 kg 96.2 kg    Examination:  General exam: Appears calm and comfortable  Respiratory system: Clear to auscultation. Respiratory effort normal. Cardiovascular system: S1 & S2 heard, RRR. No  JVD, murmurs, rubs, gallops or clicks. No pedal edema. Gastrointestinal system: Abdomen is nondistended, soft and nontender. No organomegaly or masses felt. Normal bowel sounds heard. Central nervous system: Alert and oriented x3. No focal neurological deficits. Extremities: Symmetric 5 x 5 power. Skin: No rashes, lesions or ulcers Psychiatry: Judgement and insight appear normal. Mood & affect appropriate.     Data Reviewed: I have personally reviewed following labs and imaging  studies  CBC: Recent Labs  Lab 04/13/20 1613 04/14/20 0107 04/18/20 0422  WBC 8.6 11.1* 8.4  NEUTROABS 6.7 8.7*  --   HGB 13.2 11.1* 10.5*  HCT 39.4 34.8* 32.4*  MCV 91.0 93.0 92.6  PLT 197 168 315   Basic Metabolic Panel: Recent Labs  Lab 04/14/20 0107 04/14/20 1114 04/15/20 0848 04/16/20 0543 04/17/20 0752 04/19/20 0359  NA 138  --  140 140 139 137  K 3.1* 3.6 3.1* 3.3* 3.8 4.1  CL 102  --  103 107 108 108  CO2 31  --  26 25 24 22   GLUCOSE 104*  --  130* 100* 96 99  BUN 13  --  13 18 24* 29*  CREATININE 1.00  --  1.10* 0.90 0.95 0.99  CALCIUM 9.6  --  9.2 8.4* 8.5* 8.8*  MG 1.6*  --  1.8 1.7 1.7 1.6*  PHOS 2.2*  --  2.0* 2.4* 2.9 2.9   GFR: Estimated Creatinine Clearance: 54.2 mL/min (by C-G formula based on SCr of 0.99 mg/dL). Liver Function Tests: Recent Labs  Lab 04/13/20 1613 04/14/20 0107  AST 15 12*  ALT 6 <5  ALKPHOS 75 59  BILITOT 1.0 0.8  PROT 7.4 5.9*  ALBUMIN 3.9 3.2*   No results for input(s): LIPASE, AMYLASE in the last 168 hours. Recent Labs  Lab 04/13/20 1939  AMMONIA 10   Coagulation Profile: Recent Labs  Lab 04/13/20 1613  INR 1.0   Cardiac Enzymes: Recent Labs  Lab 04/13/20 1915  CKTOTAL 81   BNP (last 3 results) No results for input(s): PROBNP in the last 8760 hours. HbA1C: No results for input(s): HGBA1C in the last 72 hours. CBG: Recent Labs  Lab 04/14/20 2058  GLUCAP 129*   Lipid Profile: No results for input(s): CHOL, HDL, LDLCALC, TRIG, CHOLHDL, LDLDIRECT in the last 72 hours. Thyroid Function Tests: No results for input(s): TSH, T4TOTAL, FREET4, T3FREE, THYROIDAB in the last 72 hours. Anemia Panel: No results for input(s): VITAMINB12, FOLATE, FERRITIN, TIBC, IRON, RETICCTPCT in the last 72 hours. Sepsis Labs: No results for input(s): PROCALCITON, LATICACIDVEN in the last 168 hours.  Recent Results (from the past 240 hour(s))  Resp Panel by RT-PCR (Flu A&B, Covid) Nasopharyngeal Swab     Status: None    Collection Time: 04/13/20  4:10 PM   Specimen: Nasopharyngeal Swab; Nasopharyngeal(NP) swabs in vial transport medium  Result Value Ref Range Status   SARS Coronavirus 2 by RT PCR NEGATIVE NEGATIVE Final    Comment: (NOTE) SARS-CoV-2 target nucleic acids are NOT DETECTED.  The SARS-CoV-2 RNA is generally detectable in upper respiratory specimens during the acute phase of infection. The lowest concentration of SARS-CoV-2 viral copies this assay can detect is 138 copies/mL. A negative result does not preclude SARS-Cov-2 infection and should not be used as the sole basis for treatment or other patient management decisions. A negative result may occur with  improper specimen collection/handling, submission of specimen other than nasopharyngeal swab, presence of viral mutation(s) within the areas targeted by this assay, and inadequate number  of viral copies(<138 copies/mL). A negative result must be combined with clinical observations, patient history, and epidemiological information. The expected result is Negative.  Fact Sheet for Patients:  EntrepreneurPulse.com.au  Fact Sheet for Healthcare Providers:  IncredibleEmployment.be  This test is no t yet approved or cleared by the [place] FDA and  has been authorized for detection and/or diagnosis of SARS-CoV-2 by FDA under an Emergency Use Authorization (EUA). This EUA will remain  in effect (meaning this test can be used) for the duration of the COVID-19 declaration under Section 564(b)(1) of the Act, 21 U.S.C.section 360bbb-3(b)(1), unless the authorization is terminated  or revoked sooner.       Influenza A by PCR NEGATIVE NEGATIVE Final   Influenza B by PCR NEGATIVE NEGATIVE Final    Comment: (NOTE) The Xpert Xpress SARS-CoV-2/FLU/RSV plus assay is intended as an aid in the diagnosis of influenza from Nasopharyngeal swab specimens and should not be used as a sole basis for treatment.  Nasal washings and aspirates are unacceptable for Xpert Xpress SARS-CoV-2/FLU/RSV testing.  Fact Sheet for Patients: EntrepreneurPulse.com.au  Fact Sheet for Healthcare Providers: IncredibleEmployment.be  This test is not yet approved or cleared by the [place] FDA and has been authorized for detection and/or diagnosis of SARS-CoV-2 by FDA under an Emergency Use Authorization (EUA). This EUA will remain in effect (meaning this test can be used) for the duration of the COVID-19 declaration under Section 564(b)(1) of the Act, 21 U.S.C. section 360bbb-3(b)(1), unless the authorization is terminated or revoked.  Performed at [facility], [address], [place], [place] [address]          Radiology Studies: No results found.      Scheduled Meds: . aspirin EC  81 mg Oral Daily  . atorvastatin  20 mg Oral Daily  . carbidopa-levodopa  2 tablet Oral QID  . carbidopa-levodopa  2 tablet Oral QHS  . carvedilol  3.125 mg Oral BID WC  . diclofenac Sodium  4 g Topical QID  . enoxaparin (LOVENOX) injection  0.5 mg/kg Subcutaneous Q24H  . feeding supplement  1 Container Oral TID BM  . multivitamin with minerals  1 tablet Oral Daily  . pantoprazole  40 mg Oral Daily  . vitamin B-12  1,000 mcg Oral Daily   Continuous Infusions: . magnesium sulfate bolus IVPB       LOS: 5 days    Time spent: 25 minutes    Sharen Hones, MD [facility]   To contact the attending provider between 7A-7P or the covering provider during after hours 7P-7A, please log into the web site [email or web address] and access using universal [place] password for that web site. If you do not have the password, please call the hospital operator.  04/19/2020, 9:58 AM

## 2020-04-20 LAB — RESP PANEL BY RT-PCR (FLU A&B, COVID) ARPGX2
Influenza A by PCR: NEGATIVE
Influenza B by PCR: NEGATIVE
SARS Coronavirus 2 by RT PCR: NEGATIVE

## 2020-04-20 MED ORDER — ALPRAZOLAM 0.5 MG PO TABS: 0.2500 mg | ORAL_TABLET | Freq: Three times a day (TID) | ORAL | 2 refills | Status: DC | PRN

## 2020-04-20 MED ORDER — TRAMADOL HCL 50 MG PO TABS: 50.0000 mg | ORAL_TABLET | Freq: Two times a day (BID) | ORAL | 0 refills | Status: DC | PRN

## 2020-04-20 MED ORDER — POLYETHYLENE GLYCOL 3350 17 G PO PACK
17.0000 g | PACK | Freq: Every day | ORAL | Status: DC
  Administered 2020-04-20: 17 g via ORAL
  Filled 2020-04-20: qty 1

## 2020-04-20 NOTE — Progress Notes (Signed)
Occupational Therapy Treatment Patient Details Name: Terri Perez MRN: [id number] DOB: 27-Apr-1945 Today's Date: 04/20/2020    History of present illness Terri Perez is a 75 y.o. female with medical history significant of Parkinson's disease, breast cancer, constipation, CKD stage3, GERD who presented to ED after a fall.  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   OT comments  Terri Perez continues to present with generalized weakness, impaired balance, and impaired cognition that impacts her ability to safely and independently complete functional tasks.  Pt was polite and agreeable to today's session focused on self-care and transfer practice to improve independence in self-care tasks and functional strengthening.  OTR provided mod assist for supine > sit, mod assist +2 for sit to stand with RW, and min-mod assist +2 for stand pivot transfer from bed to recliner.  OTR provided setup assist for pt to complete UB grooming tasks while seated in recliner.  Pt tolerated activity well and shows improved strength and motivation in therapy.  Pt with slow processing and difficulty with sequencing, but is able to follow one-step commands with increased time.  Terri Perez will continue to benefit from skilled OT services in acute setting to address functional strengthening, safety awareness, balance, and safety and independence in ADLs.  SNF is most appropriate discharge recommendation at this time given generalized weakness and impaired cognition.   Follow Up Recommendations  SNF    Equipment Recommendations  None recommended by OT    Recommendations for Other Services      Precautions / Restrictions Precautions Precautions: Fall Restrictions Weight Bearing Restrictions: No       Mobility Bed Mobility Overal bed mobility: Needs Assistance Bed Mobility: Supine to Sit     Supine to sit: Mod assist  (assist for BLE placement and trunk)          Transfers Overall transfer level: Needs assistance Equipment used: Rolling walker (2 wheeled) Transfers: Sit to/from [facility] Sit to Stand: Mod assist;+2 safety/equipment Stand pivot transfers: Mod assist       General transfer comment: OTR and therapy aide provided mod assist x2 for sit to stand with RW, less physical assistance needed once upright. Provided min-mod verbal cues for safety/sequencing    Balance Overall balance assessment: Needs assistance Sitting-balance support: Bilateral upper extremity supported;Feet supported Sitting balance-Leahy Scale: Good Sitting balance - Comments: supervision sitting EOB   Standing balance support: Bilateral upper extremity supported Standing balance-Leahy Scale: Fair Standing balance comment: requires BUE support for standing                           ADL either performed or assessed with clinical judgement   ADL Overall ADL's : Needs assistance/impaired Eating/Feeding: Set up;Sitting Eating/Feeding Details (indicate cue type and reason): provided setup assist for pt to eat lunch while seated upright in recliner Grooming: Wash/dry hands;Set up;Sitting Grooming Details (indicate cue type and reason): provided setup assist for pt to wash face while seated in recliner                 Toilet Transfer: Moderate assistance;+2 for safety/equipment;Stand-pivot;RW Toilet Transfer Details (indicate cue type and reason): provided mod assist +2 (for safety) for stand pivot transfer         Functional mobility during ADLs: +2 for safety/equipment;Cueing for sequencing;Cueing for safety;Rolling walker;Moderate assistance General ADL Comments: Pt able to complete stand pivot transfer with +2  mod assist and RW from bed > recliner, able to complete seated UB ADLs (feeding, oral hygiene, face washing, UB dressing/bathing) with grossly setup assist     Vision  Patient Visual Report: No change from baseline     Perception     Praxis      Cognition Arousal/Alertness: Awake/alert Behavior During Therapy: WFL for tasks assessed/performed Overall Cognitive Status: History of cognitive impairments - at baseline                                 General Comments: Slow processing, able to follow one step commands with increased time        Exercises Other Exercises Other Exercises: Provided mod assist for bed mobility, functional transfers, ambulation.  Provided setup assist for seated grooming tasks.   Shoulder Instructions       General Comments      Pertinent Vitals/ Pain       Pain Assessment: No/denies pain Pain Intervention(s): Limited activity within patient's tolerance;Monitored during session  Home Living                                          Prior Functioning/Environment              Frequency  Min 1X/week        Progress Toward Goals  OT Goals(current goals can now be found in the care plan section)  Progress towards OT goals: Progressing toward goals  Acute Rehab OT Goals Patient Stated Goal: to walk further distances OT Goal Formulation: With family Time For Goal Achievement: 04/28/20 Potential to Achieve Goals: [place] Discharge plan remains appropriate;Frequency remains appropriate    Co-evaluation                 AM-PAC OT "6 Clicks" Daily Activity     Outcome Measure   Help from another person eating meals?: None Help from another person taking care of personal grooming?: A Little Help from another person toileting, which includes using toliet, bedpan, or urinal?: A Lot Help from another person bathing (including washing, rinsing, drying)?: A Lot Help from another person to put on and taking off regular upper body clothing?: A Little Help from another person to put on and taking off regular lower body clothing?: A Lot 6 Click Score: 16    End of  Session Equipment Utilized During Treatment: Gait belt;Rolling walker  OT Visit Diagnosis: Unsteadiness on feet (R26.81);Repeated falls (R29.6);Muscle weakness (generalized) (M62.81);History of falling (Z91.81)   Activity Tolerance Patient tolerated treatment well   Patient Left in chair;with chair alarm set;with call bell/phone within reach   Nurse Communication          Time: 6144-3154 OT Time Calculation (min): 26 min  Charges: OT General Charges $OT Visit: 1 Visit OT Treatments $Self Care/Home Management : 8-22 mins $Therapeutic Activity: 8-22 mins  Myrtie Hawk Alexandera Kuntzman, OTR/L 04/20/20, 4:10 PM

## 2020-04-20 NOTE — Care Management Important Message (Signed)
Important Message  Patient Details  Name: Terri Perez MRN: [id number] Date of Birth: [birth year]   Medicare Important Message Given:  Yes     Dannette Barbara 04/20/2020, 11:48 AM

## 2020-04-20 NOTE — Discharge Summary (Signed)
Physician Discharge Summary  [name] MRN: [id number] DOB/AGE: [birth year] 76 y.o.  Admit date: 04/13/2020 Discharge date: 04/20/2020  Admission Diagnoses:  Discharge Diagnoses:  Principal Problem:   Syncope and collapse Active Problems:   Acute anxiety   Syncope   Chronic kidney disease (CKD), stage III (moderate) (HCC)   Parkinson disease (HCC)   Hypokalemia   Dehydration   Acute metabolic encephalopathy   AKI (acute kidney injury) ([place])   Intractable vomiting with nausea   Elevated troponin   B12 deficiency anemia   Discharged Condition: good  Hospital Course:  Terri Perez a 75 year old female who lives with her husband and has a medical h/oCKD 3B, anxiety, Parkinson's disease,obesity, chronic constipation, rheumatoid arthritis with positive rheumatoid factor, fibromyalgia, osteoporosis with multiple fractures, breast cancer in remissionwho presents to the hospital after a fall. Itappears that patient had a recurrent falls in the past, she had multiple old fractures of on the ribs and spine. 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 She also had some dysphagia, barium esophagram showed esophageal spasm without stenosis. I have scheduled the patient to see gastroenterology as outpatient. Patient was initially planning to be discharged home with home care, however, husband request patient to be transferred to nursing home.  #1.  Syncope and collapse. Demand ischemia with mild elevation troponin. Parkinson disease. Patient condition continued to be stable, good appetite, no additional dehydration.  2.  Weight loss.  Anorexia. Esophageal spasm. Patient be referred to GI as outpatient.  #3.  Hypomagnesemia improved     Consults: None  Significant Diagnostic Studies:   ESOPHOGRAM/BARIUM SWALLOW  TECHNIQUE: Single contrast examination was performed using thin barium or water soluble.  FLUOROSCOPY TIME: Fluoroscopy Time: 42 seconds  Radiation Exposure Index (if provided by the fluoroscopic device): 6.3 mGy  Number of Acquired Spot Images: 0  COMPARISON: None.  FINDINGS: Limited evaluation secondary to limited patient mobility. Patient is unable to stand. Examination was performed in the semi upright position. Normal swallow function. Contrast transit through the esophagus without delay. No esophageal ulceration, mass or stricture. Tertiary contractions of the distal third of the esophagus as can be seen with presbyesophagus versus spasm.  At the end of the examination a 13 mm barium tablet was administered which transited through the esophagus and esophagogastric junction without delay.  IMPRESSION: 1. No esophageal stricture. 2. Tertiary contractions of the distal third of the esophagus as can be seen with presbyesophagus versus spasm.   Electronically Signed By: Kathreen Devoid On: 04/14/2020 16:29  BILATERAL CAROTID DUPLEX ULTRASOUND  TECHNIQUE: Pearline Cables scale imaging, color Doppler and duplex ultrasound were performed of bilateral carotid and vertebral arteries in the neck.  COMPARISON: None.  FINDINGS: Criteria: Quantification of carotid stenosis is based on velocity parameters that correlate the residual internal carotid diameter with NASCET-based stenosis levels, using the diameter of the distal internal carotid lumen as the denominator for stenosis measurement.  The following velocity measurements were obtained:  RIGHT  ICA: Peak systolic velocity 102 cm/sec, End diastolic velocity 31 cm/sec  CCA: Peak systolic velocity 71 cm/sec  SYSTOLIC ICA/CCA RATIO: 1.6  ECA: Peak systolic velocity 96 cm/sec  LEFT  ICA: Peak systolic velocity 98 cm/sec, End diastolic velocity 29 cm/sec  CCA:  81 cm/sec  SYSTOLIC ICA/CCA RATIO: 1.2  ECA: 80 cm/sec  RIGHT CAROTID ARTERY: Mild focal atherosclerotic  plaque formation about the proximal ICA. No significant tortuosity. Normal low resistance waveforms.  RIGHT VERTEBRAL ARTERY: Antegrade flow.  LEFT CAROTID ARTERY: Mild multifocal atherosclerotic plaque formation about the carotid bulb and bifurcation. No significant tortuosity. Normal low resistance waveforms.  LEFT VERTEBRAL ARTERY: Antegrade flow.  Upper extremity non-invasive blood pressures:  Not obtained.  IMPRESSION: 1. Right carotid artery system: Less than 50% stenosis secondary to mild atherosclerotic plaque formation.  2. Left carotid artery system: Less than 50% stenosis secondary to mild atherosclerotic plaque formation.  3. Vertebral artery system: Patent with antegrade flow bilaterally.  Ruthann Cancer, MD  [facility]  [facility]   Electronically Signed By: Ruthann Cancer MD On: 04/14/2020 14:01  CT HEAD WITHOUT CONTRAST  CT CERVICAL SPINE WITHOUT CONTRAST  TECHNIQUE: Multidetector CT imaging of the head and cervical spine was performed following the standard protocol without intravenous contrast. Multiplanar CT image reconstructions of the cervical spine were also generated.  COMPARISON: MR head 05/06/2016  FINDINGS: CT HEAD FINDINGS  Brain:  Cerebral ventricle sizes are concordant with the degree of cerebral volume loss.  No evidence of large-territorial acute infarction. No parenchymal hemorrhage. No mass lesion. No extra-axial collection.  No mass effect or midline shift. No hydrocephalus. Basilar cisterns are patent.  Vascular: No hyperdense vessel.  Skull: No acute fracture or focal lesion.  Sinuses/Orbits: Paranasal sinuses and mastoid air cells are clear. The orbits are unremarkable.  Other: None.  CT CERVICAL SPINE  FINDINGS  Alignment: Normal.  Skull base and vertebrae: Diffusely decreased bone density. Multilevel mild degenerative changes of the spine. Partial fusion of the C5-C6 vertebral bodies. Mild to moderate intervertebral disc spaces at the C6-C7 level. No acute fracture. No aggressive appearing focal osseous lesion or focal pathologic process.  Soft tissues and spinal canal: No prevertebral fluid or swelling. No visible canal hematoma.  Upper chest: Unremarkable.  Other: None.  IMPRESSION: 1. No acute intracranial abnormality. 2. No acute displaced fracture or traumatic listhesis of the cervical spine.   Electronically Signed By: Iven Finn M.D. On: 04/13/2020 18:19  CT ABDOMEN AND PELVIS WITH CONTRAST  TECHNIQUE: Multidetector CT imaging of the abdomen and pelvis was performed using the standard protocol following bolus administration of intravenous contrast. Sagittal and coronal MPR images reconstructed from axial data set.  CONTRAST: 74mL OMNIPAQUE IOHEXOL 350 MG/ML SOLN IV. No oral contrast.  COMPARISON: 06/01/2010  FINDINGS: Lower chest: Mild bibasilar atelectasis  Hepatobiliary: Focal fatty infiltration of liver adjacent to falciform fissure. Liver and gallbladder otherwise normal appearance  Pancreas: Atrophic without mass  Spleen: Normal appearance  Adrenals/Urinary Tract: Small BILATERAL nonobstructing renal calculi. Small peripelvic LEFT renal cysts. Adrenal glands, kidneys, ureters, and bladder otherwise normal appearance  Stomach/Bowel: Bowel anastomosis in pelvis, ileocolic. Prior subtotal colectomy with residual rectum and short segment of sigmoid colon. Remaining bowel loops unremarkable. Stomach normal appearance.  Vascular/Lymphatic: Retroaortic LEFT renal vein. Aorta normal caliber. Mild atherosclerotic calcifications aorta. No adenopathy.  Reproductive: Uterus surgically absent. Probable atrophic  ovaries.  Other: Small umbilical hernia containing fat. No free air or free fluid. No inflammatory process.  Musculoskeletal: Osseous demineralization. RIGHT hip prosthesis. Subacute to old fractures of the LEFT transverse processes of L1 and L2. Old fracture posterior LEFT twelfth rib. Superior endplate compression fracture of L2 with mild anterior height loss, appears old.  IMPRESSION: Prior subtotal colectomy.  Small umbilical hernia containing fat.  Nonobstructing renal calculi.  Small BILATERAL nonobstructing renal calculi and small peripelvic LEFT renal cysts.  Subacute to  old fractures of the LEFT twelfth rib, LEFT transverse processes of L1 and L2 and superior endplate compression fracture of L2.  No acute intra-abdominal or intrapelvic abnormalities.  Aortic Atherosclerosis (ICD10-I70.0).   Electronically Signed By: Lavonia Dana M.D. On: 04/13/2020 18:21  CT ANGIOGRAPHY CHEST WITH CONTRAST  TECHNIQUE: Multidetector CT imaging of the chest was performed using the standard protocol during bolus administration of intravenous contrast. Multiplanar CT image reconstructions and MIPs were obtained to evaluate the vascular anatomy.  CONTRAST: 30mL OMNIPAQUE IOHEXOL 350 MG/ML SOLN  COMPARISON: None.  FINDINGS: Cardiovascular: No filling defects in the pulmonary arteries to suggest pulmonary emboli. Cardiomegaly. Coronary artery and aortic calcifications. No aneurysm.  Mediastinum/Nodes: No mediastinal, hilar, or axillary adenopathy. Trachea and esophagus are unremarkable.  Lungs/Pleura: Bibasilar atelectasis. No confluent opacities or effusions.  Upper Abdomen: Imaging into the upper abdomen demonstrates no acute findings.  Musculoskeletal: Chest wall soft tissues are unremarkable. No acute bony abnormality.  Review of the MIP images confirms the above findings.  IMPRESSION: No evidence of pulmonary  embolus.  Cardiomegaly, coronary artery disease.  Bibasilar atelectasis.  Aortic Atherosclerosis (ICD10-I70.0).   Electronically Signed By: Rolm Baptise M.D. On: 04/13/2020 18:19        Treatments:IV fluids  Discharge Exam: Blood pressure 135/66, pulse 90, temperature 98.7 F (37.1 C), temperature source Oral, resp. rate 18, height 5\' 3"  (1.6 m), weight 96.2 kg, SpO2 94 %. General appearance: alert and cooperative Resp: clear to auscultation bilaterally Cardio: regular rate and rhythm, S1, S2 normal, no murmur, click, rub or gallop GI: soft, non-tender; bowel sounds normal; no masses,  no organomegaly Extremities: extremities normal, atraumatic, no cyanosis or edema  Disposition: Discharge disposition: 03-Skilled Nursing Facility       Discharge Instructions    Diet general   Complete by: As directed    Dysphagia 1 diet   Diet general   Complete by: As directed    Dysphagia 1 diet   Increase activity slowly   Complete by: As directed    Increase activity slowly   Complete by: As directed      Allergies as of 04/20/2020      Reactions   Alendronate Nausea Only   Gi symptoms   Gluten Meal Nausea Only   Other reaction(s): NAUSEA Other reaction(s): NAUSEA Other reaction(s): NAUSEA   Lactose Intolerance (gi) Diarrhea   Nsaids    GI upset. Other reaction(s): Other (See Comments) Pt unsure   Other Other (See Comments)   Other reaction(s): OTHER   Oxycodone-acetaminophen    Oxycodone-acetaminophen Other (See Comments)   Other reaction(s): NAUSEA   Vicodin [hydrocodone-acetaminophen] Nausea Only, Diarrhea, Nausea And Vomiting   Wheat Bran Other (See Comments)   Sulfa Antibiotics Rash   Rash from steri strips   Tape Rash   Rash from steri strips      Medication List    TAKE these medications   ALPRAZolam 0.5 MG tablet Commonly known as: XANAX Take 0.5 tablets (0.25 mg total) by mouth 3 (three) times daily as needed. What changed:    how much to take  when to take this   aspirin 81 MG EC tablet Take 1 tablet (81 mg total) by mouth daily. Swallow whole.   atorvastatin 20 MG tablet Commonly known as: Lipitor Take 1 tablet (20 mg total) by mouth daily.   carbidopa-levodopa 25-100 MG tablet Commonly known as: SINEMET IR Take 2 tablets by mouth 3 (three) times daily. What changed: how much to take   carvedilol 3.125 MG  tablet Commonly known as: COREG Take 1 tablet (3.125 mg total) by mouth 2 (two) times daily with a meal.   ondansetron 4 MG disintegrating tablet Commonly known as: ZOFRAN-ODT Take 1 tablet (4 mg total) by mouth every 8 (eight) hours as needed for nausea or vomiting.   pantoprazole 40 MG tablet Commonly known as: Protonix Take 1 tablet (40 mg total) by mouth daily.   traMADol 50 MG tablet Commonly known as: ULTRAM Take 1 tablet (50 mg total) by mouth every 12 (twelve) hours as needed for moderate pain. What changed:   when to take this  reasons to take this   vitamin B-12 1000 MCG tablet Commonly known as: CYANOCOBALAMIN Take 1 tablet (1,000 mcg total) by mouth daily.       Follow-up Information    Jerrol Banana., MD On 04/21/2020.   Specialty: Family Medicine Why: @ 4pm Contact information: [address] [place] [address] [phone]        Nelva Bush, MD On 04/22/2020.   Specialty: Cardiology Why: @ 10am Contact information: [address] [place] [address] [phone]        Jonathon Bellows, MD On 05/04/2020.   Specialty: Gastroenterology Why: @ 1pm Contact information: [place] [address] [phone]              35 minutes Signed: Sharen Hones 04/20/2020, 9:13 AM

## 2020-04-20 NOTE — TOC Progression Note (Signed)
Transition of Care [facility]) - Progression Note    Patient Details  Name: Terri Perez MRN: [id number] Date of Birth: 1945/03/15  Transition of Care [facility]) CM/SW Contact  Eileen Stanford, LCSW Phone Number: 04/20/2020, 1:54 PM  Clinical Narrative: Per Magda Paganini at [facility], bed will be [employer]. MD notified.      Expected Discharge Plan: [place] Barriers to Discharge: Barriers Unresolved (comment) (Husband and Patient refuse Home Health services.)  Expected Discharge Plan and Services Expected Discharge Plan: [place] In-house Referral: Clinical Social Work   Post Acute Care Choice: NA Living arrangements for the past 2 months: [place] Expected Discharge Date: 04/20/20               DME Arranged: N/A DME Agency: NA       HH Arranged: Refused [place] Agency: [place] ([place]) Date [place]: 04/15/20 Time [place]: 1143 Representative spoke with at [place]: [place] (SDOH) Interventions    Readmission Risk Interventions No flowsheet data found.

## 2020-04-21 ENCOUNTER — Inpatient Hospital Stay: Payer: Medicare Other | Admitting: Family Medicine

## 2020-04-21 DIAGNOSIS — Z9181 History of falling: Secondary | ICD-10-CM | POA: Diagnosis not present

## 2020-04-21 DIAGNOSIS — D513 Other dietary vitamin B12 deficiency anemia: Secondary | ICD-10-CM | POA: Diagnosis not present

## 2020-04-21 DIAGNOSIS — R279 Unspecified lack of coordination: Secondary | ICD-10-CM | POA: Diagnosis not present

## 2020-04-21 DIAGNOSIS — K224 Dyskinesia of esophagus: Secondary | ICD-10-CM | POA: Diagnosis not present

## 2020-04-21 DIAGNOSIS — M810 Age-related osteoporosis without current pathological fracture: Secondary | ICD-10-CM | POA: Diagnosis not present

## 2020-04-21 DIAGNOSIS — Z853 Personal history of malignant neoplasm of breast: Secondary | ICD-10-CM | POA: Diagnosis not present

## 2020-04-21 DIAGNOSIS — M069 Rheumatoid arthritis, unspecified: Secondary | ICD-10-CM | POA: Diagnosis not present

## 2020-04-21 DIAGNOSIS — E538 Deficiency of other specified B group vitamins: Secondary | ICD-10-CM | POA: Diagnosis not present

## 2020-04-21 DIAGNOSIS — R252 Cramp and spasm: Secondary | ICD-10-CM | POA: Diagnosis not present

## 2020-04-21 DIAGNOSIS — R488 Other symbolic dysfunctions: Secondary | ICD-10-CM | POA: Diagnosis not present

## 2020-04-21 DIAGNOSIS — M797 Fibromyalgia: Secondary | ICD-10-CM | POA: Diagnosis not present

## 2020-04-21 DIAGNOSIS — Z7982 Long term (current) use of aspirin: Secondary | ICD-10-CM | POA: Diagnosis not present

## 2020-04-21 DIAGNOSIS — N1832 Chronic kidney disease, stage 3b: Secondary | ICD-10-CM | POA: Diagnosis not present

## 2020-04-21 DIAGNOSIS — I951 Orthostatic hypotension: Secondary | ICD-10-CM | POA: Diagnosis not present

## 2020-04-21 DIAGNOSIS — R5381 Other malaise: Secondary | ICD-10-CM | POA: Diagnosis not present

## 2020-04-21 DIAGNOSIS — R55 Syncope and collapse: Secondary | ICD-10-CM | POA: Diagnosis not present

## 2020-04-21 DIAGNOSIS — E876 Hypokalemia: Secondary | ICD-10-CM | POA: Diagnosis not present

## 2020-04-21 DIAGNOSIS — E669 Obesity, unspecified: Secondary | ICD-10-CM | POA: Diagnosis not present

## 2020-04-21 DIAGNOSIS — F419 Anxiety disorder, unspecified: Secondary | ICD-10-CM | POA: Diagnosis not present

## 2020-04-21 DIAGNOSIS — G9341 Metabolic encephalopathy: Secondary | ICD-10-CM | POA: Diagnosis not present

## 2020-04-21 DIAGNOSIS — G20 Parkinson's disease: Secondary | ICD-10-CM | POA: Diagnosis not present

## 2020-04-21 DIAGNOSIS — M4606 Spinal enthesopathy, lumbar region: Secondary | ICD-10-CM | POA: Diagnosis not present

## 2020-04-21 DIAGNOSIS — R768 Other specified abnormal immunological findings in serum: Secondary | ICD-10-CM | POA: Diagnosis not present

## 2020-04-21 DIAGNOSIS — K219 Gastro-esophageal reflux disease without esophagitis: Secondary | ICD-10-CM | POA: Diagnosis not present

## 2020-04-21 DIAGNOSIS — R1312 Dysphagia, oropharyngeal phase: Secondary | ICD-10-CM | POA: Diagnosis not present

## 2020-04-21 DIAGNOSIS — M6281 Muscle weakness (generalized): Secondary | ICD-10-CM | POA: Diagnosis not present

## 2020-04-21 DIAGNOSIS — N179 Acute kidney failure, unspecified: Secondary | ICD-10-CM | POA: Diagnosis not present

## 2020-04-21 DIAGNOSIS — M160 Bilateral primary osteoarthritis of hip: Secondary | ICD-10-CM | POA: Diagnosis not present

## 2020-04-21 DIAGNOSIS — G2581 Restless legs syndrome: Secondary | ICD-10-CM | POA: Diagnosis not present

## 2020-04-21 LAB — CBC
HCT: 32.5 % — ABNORMAL LOW (ref 36.0–46.0)
Hemoglobin: 10.6 g/dL — ABNORMAL LOW (ref 12.0–15.0)
MCH: 30.3 pg (ref 26.0–34.0)
MCHC: 32.6 g/dL (ref 30.0–36.0)
MCV: 92.9 fL (ref 80.0–100.0)
Platelets: 206 10*3/uL (ref 150–400)
RBC: 3.5 MIL/uL — ABNORMAL LOW (ref 3.87–5.11)
RDW: 16.8 % — ABNORMAL HIGH (ref 11.5–15.5)
WBC: 8.8 10*3/uL (ref 4.0–10.5)
nRBC: 0 % (ref 0.0–0.2)

## 2020-04-21 MED ORDER — ALPRAZOLAM 0.5 MG PO TABS: 0.5000 mg | ORAL_TABLET | Freq: Three times a day (TID) | ORAL | 0 refills | Status: DC | PRN

## 2020-04-21 MED ORDER — POLYETHYLENE GLYCOL 3350 17 G PO PACK
34.0000 g | PACK | Freq: Every day | ORAL | Status: DC
  Administered 2020-04-21: 34 g via ORAL
  Filled 2020-04-21: qty 2

## 2020-04-21 NOTE — TOC Transition Note (Addendum)
Transition of Care [facility]) - CM/SW Discharge Note   Patient Details  Name: AMERIA SANJURJO MRN: [id number] Date of Birth: 1945/07/07  Transition of Care [facility]) CM/SW Contact:  Eileen Stanford, LCSW Phone Number: 04/21/2020, 11:38 AM   Clinical Narrative:  Clinical Social Worker facilitated patient discharge including contacting patient family and facility to confirm patient discharge plans.  Clinical information faxed to facility and family agreeable with plan.  CSW arranged ambulance transport via [facility] (1:00 pickup) to [facility].  RN to call [phone] for report prior to discharge.      Final next level of care: Skilled Nursing Facility Barriers to Discharge: No Barriers Identified   Patient Goals and CMS Choice Patient states their goals for this hospitalization and ongoing recovery are:: To return home, without [place] per OT recommendations.   Choice offered to / list presented to : Spouse (Husband request Aide services, list given.)  Discharge Placement              Patient chooses bed at:  ([facility]) Patient to be transferred to facility by: [facility] Name of family member notified: spouse Patient and family notified of of transfer: 04/21/20  Discharge Plan and Services In-house Referral: Clinical Social Work   Post Acute Care Choice: NA          DME Arranged: N/A DME Agency: NA       HH Arranged: Refused [place] Agency: [place] ([place]) Date [place]: 04/15/20 Time [place]: 1143 Representative spoke with at [place]: [place] (SDOH) Interventions     Readmission Risk Interventions No flowsheet data found.

## 2020-04-21 NOTE — Discharge Summary (Signed)
Physician Discharge Summary  [name] MRN: [id number] DOB/AGE: October 26, 1945 75 y.o.  Admit date: 04/13/2020 Discharge date: 04/21/2020  Admission Diagnoses:  Discharge Diagnoses:  Principal Problem:   Syncope and collapse Active Problems:   Acute anxiety   Syncope   Chronic kidney disease (CKD), stage III (moderate) (HCC)   Parkinson disease (HCC)   Hypokalemia   Dehydration   Acute metabolic encephalopathy   AKI (acute kidney injury) (HCC)   Intractable vomiting with nausea   Elevated troponin   B12 deficiency anemia Demand ischemia due to low bp. Weight loss Anorexia Esophageal spasm. Hypomagnesemia.   Discharged Condition: [place] [facility] Course: Please see yesterday discharge summary. Currently, there is no change in patients condition, she is medically stable to discharge.   Consults: None  Significant Diagnostic Studies:  See yesterdays note  Treatments: See yesterdays note  Discharge Exam: Blood pressure 138/64, pulse 91, temperature 98.6 F (37 C), temperature source Oral, resp. rate 18, height 5\' 3"  (1.6 m), weight 91.2 kg, SpO2 91 %. General appearance: alert and cooperative Resp: clear to auscultation bilaterally Cardio: regular rate and rhythm, S1, S2 normal, no murmur, click, rub or gallop GI: soft, non-tender; bowel sounds normal; no masses,  no organomegaly Extremities: extremities normal, atraumatic, no cyanosis or edema  Disposition: Discharge disposition: 01-Home or Self Care       Discharge Instructions    Diet general   Complete by: As directed    Dysphagia 1 diet   Diet general   Complete by: As directed    Dysphagia 1 diet   Increase activity slowly   Complete by: As directed    Increase activity slowly   Complete by: As directed      Allergies as of 04/21/2020      Reactions   Alendronate Nausea Only   Gi symptoms   Gluten Meal Nausea Only   Other reaction(s): NAUSEA Other reaction(s): NAUSEA Other  reaction(s): NAUSEA   Lactose Intolerance (gi) Diarrhea   Nsaids    GI upset. Other reaction(s): Other (See Comments) Pt unsure   Other Other (See Comments)   Other reaction(s): OTHER   Oxycodone-acetaminophen    Oxycodone-acetaminophen Other (See Comments)   Other reaction(s): NAUSEA   Vicodin [hydrocodone-acetaminophen] Nausea Only, Diarrhea, Nausea And Vomiting   Wheat Bran Other (See Comments)   Sulfa Antibiotics Rash   Rash from steri strips   Tape Rash   Rash from steri strips      Medication List    TAKE these medications   ALPRAZolam 0.5 MG tablet Commonly known as: XANAX Take 1 tablet (0.5 mg total) by mouth 3 (three) times daily as needed. What changed: when to take this   aspirin 81 MG EC tablet Take 1 tablet (81 mg total) by mouth daily. Swallow whole.   atorvastatin 20 MG tablet Commonly known as: Lipitor Take 1 tablet (20 mg total) by mouth daily.   carbidopa-levodopa 25-100 MG tablet Commonly known as: SINEMET IR Take 2 tablets by mouth 3 (three) times daily. What changed: how much to take   carvedilol 3.125 MG tablet Commonly known as: COREG Take 1 tablet (3.125 mg total) by mouth 2 (two) times daily with a meal.   ondansetron 4 MG disintegrating tablet Commonly known as: ZOFRAN-ODT Take 1 tablet (4 mg total) by mouth every 8 (eight) hours as needed for nausea or vomiting.   pantoprazole 40 MG tablet Commonly known as: Protonix Take 1 tablet (40 mg total) by mouth daily.  traMADol 50 MG tablet Commonly known as: ULTRAM Take 1 tablet (50 mg total) by mouth every 12 (twelve) hours as needed for moderate pain. What changed:   when to take this  reasons to take this   vitamin B-12 1000 MCG tablet Commonly known as: CYANOCOBALAMIN Take 1 tablet (1,000 mcg total) by mouth daily.       Contact information for follow-up providers    Jerrol Banana., MD On 04/21/2020.   Specialty: Family Medicine Why: @ 4pm Contact information: [address] [place] [address] [phone]        Nelva Bush, MD On 04/22/2020.   Specialty: Cardiology Why: @ 10am Contact information: [address] [place] [address] [phone]        Jonathon Bellows, MD On 05/04/2020.   Specialty: Gastroenterology Why: @ 1pm Contact information: [place] [address] [phone]            Contact information for after-discharge care    [facility] .   Service: Skilled Nursing Contact information: [address] [place] [phone]                  Signed: Sharen Hones 04/21/2020, 9:33 AM

## 2020-04-22 ENCOUNTER — Telehealth: Payer: Self-pay

## 2020-04-22 ENCOUNTER — Ambulatory Visit: Payer: BLUE CROSS/BLUE SHIELD | Admitting: Family

## 2020-04-22 DIAGNOSIS — I951 Orthostatic hypotension: Secondary | ICD-10-CM | POA: Diagnosis not present

## 2020-04-22 DIAGNOSIS — R768 Other specified abnormal immunological findings in serum: Secondary | ICD-10-CM | POA: Diagnosis not present

## 2020-04-22 DIAGNOSIS — K224 Dyskinesia of esophagus: Secondary | ICD-10-CM | POA: Diagnosis not present

## 2020-04-22 DIAGNOSIS — G20 Parkinson's disease: Secondary | ICD-10-CM | POA: Diagnosis not present

## 2020-04-22 DIAGNOSIS — N1832 Chronic kidney disease, stage 3b: Secondary | ICD-10-CM | POA: Diagnosis not present

## 2020-04-22 NOTE — Telephone Encounter (Signed)
  Patient was recently discharged from the hospital on 04/21/20.  No TCM completed, patient does not qualify for TCM services due to being d/c to a SNF.   Per discharge summary patient needs follow up with PCP. No HFU apt scheduled at this time however a 1m f/u is scheduled for 05/04/20 @ 1:40 PM. Juluis Rainier!

## 2020-05-04 ENCOUNTER — Ambulatory Visit: Payer: BLUE CROSS/BLUE SHIELD | Admitting: Gastroenterology

## 2020-05-04 ENCOUNTER — Ambulatory Visit: Payer: Medicare Other | Admitting: Family Medicine

## 2020-05-12 ENCOUNTER — Other Ambulatory Visit: Payer: Self-pay

## 2020-05-12 ENCOUNTER — Encounter: Payer: Self-pay | Admitting: Family Medicine

## 2020-05-12 ENCOUNTER — Ambulatory Visit (INDEPENDENT_AMBULATORY_CARE_PROVIDER_SITE_OTHER): Payer: Medicare Other | Admitting: Family Medicine

## 2020-05-12 VITALS — BP 115/66 | HR 85 | Temp 98.6°F | Resp 16 | Ht 67.0 in | Wt 199.0 lb

## 2020-05-12 DIAGNOSIS — N1831 Chronic kidney disease, stage 3a: Secondary | ICD-10-CM | POA: Diagnosis not present

## 2020-05-12 DIAGNOSIS — R55 Syncope and collapse: Secondary | ICD-10-CM | POA: Diagnosis not present

## 2020-05-12 DIAGNOSIS — G20 Parkinson's disease: Secondary | ICD-10-CM | POA: Diagnosis not present

## 2020-05-12 DIAGNOSIS — I951 Orthostatic hypotension: Secondary | ICD-10-CM | POA: Diagnosis not present

## 2020-05-12 DIAGNOSIS — R600 Localized edema: Secondary | ICD-10-CM

## 2020-05-12 DIAGNOSIS — M810 Age-related osteoporosis without current pathological fracture: Secondary | ICD-10-CM

## 2020-05-12 DIAGNOSIS — R112 Nausea with vomiting, unspecified: Secondary | ICD-10-CM | POA: Diagnosis not present

## 2020-05-12 DIAGNOSIS — C50912 Malignant neoplasm of unspecified site of left female breast: Secondary | ICD-10-CM | POA: Diagnosis not present

## 2020-05-12 NOTE — Progress Notes (Signed)
I,Terri Perez,acting as a scribe for Terri Durie, MD.,have documented all relevant documentation on the behalf of Terri Durie, MD,as directed by  Terri Durie, MD while in the presence of Terri Durie, MD.   Established patient visit   Patient: Terri Perez   DOB: 01-30-1945   [name]  MRN: [id number] Visit Date: 05/12/2020  Today's healthcare provider: [name], MD   Chief Complaint  Patient presents with  . Hospitalization Follow-up   Subjective    HPI  Follow up Hospitalization Follow-up was scheduled for falls, weakness, syncope and collapse.  Work-up was negative for the syncope and was thought to be due to orthostasis.  She is now back home after a short stay in a nursing home/rehab.  Husband continues to be her caregiver. Patient was admitted to [facility] on 04/13/2020 and discharged on 04/20/2020. She was treated for; Syncope and collapse.  Treatment for this included; see notes in chart. Telephone follow up was done on 04/22/2020 She reports good compliance with treatment. She reports this condition is improved.  --------------------------------------------------------------------       Medications: Outpatient Medications Prior to Visit  Medication Sig  . ALPRAZolam (XANAX) 0.5 MG tablet Take 1 tablet (0.5 mg total) by mouth 3 (three) times daily as needed.  Marland Kitchen aspirin EC 81 MG EC tablet Take 1 tablet (81 mg total) by mouth daily. Swallow whole.  Marland Kitchen atorvastatin (LIPITOR) 20 MG tablet Take 1 tablet (20 mg total) by mouth daily.  . carbidopa-levodopa (SINEMET IR) 25-100 MG tablet Take 2 tablets by mouth 3 (three) times daily.  . carvedilol (COREG) 3.125 MG tablet Take 1 tablet (3.125 mg total) by mouth 2 (two) times daily with a meal.  . ondansetron (ZOFRAN-ODT) 4 MG disintegrating tablet Take 1 tablet (4 mg total) by mouth every 8 (eight) hours as needed for nausea or vomiting.  . pantoprazole (PROTONIX) 40 MG tablet Take  1 tablet (40 mg total) by mouth daily.  . traMADol (ULTRAM) 50 MG tablet Take 1 tablet (50 mg total) by mouth every 12 (twelve) hours as needed for moderate pain.  . vitamin B-12 (CYANOCOBALAMIN) 1000 MCG tablet Take 1 tablet (1,000 mcg total) by mouth daily.   No facility-administered medications prior to visit.    Review of Systems  Constitutional: Negative for appetite change, chills, fatigue and fever.  Respiratory: Negative for chest tightness and shortness of breath.   Cardiovascular: Negative for chest pain and palpitations.  Gastrointestinal: Negative for abdominal pain, nausea and vomiting.  Neurological: Negative for dizziness and weakness.        Objective    BP 115/66 (BP Location: Right Arm, Patient Position: Sitting, Cuff Size: Large)   Pulse 85   Temp 98.6 F (37 C) (Oral)   Resp 16   Ht 5\' 7"  (1.702 m)   Wt 199 lb (90.3 kg)   SpO2 95%   BMI 31.17 kg/m      Physical Exam Vitals reviewed.  Constitutional:      Appearance: She is well-developed. She is obese.  HENT:     Head: Normocephalic and atraumatic.  Eyes:     General: No scleral icterus.    Conjunctiva/sclera: Conjunctivae normal.  Cardiovascular:     Rate and Rhythm: Normal rate and regular rhythm.     Heart sounds: Normal heart sounds.  Pulmonary:     Effort: Pulmonary effort is normal.     Breath sounds: Normal breath sounds.  Abdominal:  General: Bowel sounds are normal.     Palpations: Abdomen is soft.  Musculoskeletal:     Cervical back: Normal range of motion and neck supple.     Comments: +1 edema bilaterally   Skin:    General: Skin is warm and dry.  Neurological:     Mental Status: She is alert and oriented to person, place, and time. Mental status is at baseline.     Comments: Masked facies consistent with parkinsonism  Psychiatric:        Mood and Affect: Mood normal.        Behavior: Behavior normal.        Thought Content: Thought content normal.        Judgment:  Judgment normal.       No results found for any visits on 05/12/20.  Assessment & Plan     1. Orthostatic hypotension dysautonomic syndrome Patient receives total care at home by husband  2. Syncope and collapse Clinically resolved and improved.  3. Intractable vomiting with nausea Control with ondansetron as needed.  4. Involutional osteoporosis   5. Stage 3a chronic kidney disease ([place])   6. Bilateral lower extremity edema   7.  History of malignant neoplasm of left female breast, unspecified estrogen receptor status, unspecified site of breast ([place]) 8.  Parkinson's disease   No follow-ups on file.      I, Terri Durie, MD, have reviewed all documentation for this visit. The documentation on 05/18/20 for the exam, diagnosis, procedures, and orders are all accurate and complete.    Terri Rueger Cranford Mon, MD  [facility] [phone] (phone) [phone] (fax)  [place]

## 2020-05-19 ENCOUNTER — Telehealth: Payer: Self-pay

## 2020-05-19 DIAGNOSIS — Z853 Personal history of malignant neoplasm of breast: Secondary | ICD-10-CM | POA: Diagnosis not present

## 2020-05-19 DIAGNOSIS — N1832 Chronic kidney disease, stage 3b: Secondary | ICD-10-CM | POA: Diagnosis not present

## 2020-05-19 DIAGNOSIS — K219 Gastro-esophageal reflux disease without esophagitis: Secondary | ICD-10-CM | POA: Diagnosis not present

## 2020-05-19 DIAGNOSIS — K224 Dyskinesia of esophagus: Secondary | ICD-10-CM | POA: Diagnosis not present

## 2020-05-19 DIAGNOSIS — I951 Orthostatic hypotension: Secondary | ICD-10-CM | POA: Diagnosis not present

## 2020-05-19 DIAGNOSIS — G20 Parkinson's disease: Secondary | ICD-10-CM | POA: Diagnosis not present

## 2020-05-19 DIAGNOSIS — Z9012 Acquired absence of left breast and nipple: Secondary | ICD-10-CM | POA: Diagnosis not present

## 2020-05-19 DIAGNOSIS — M810 Age-related osteoporosis without current pathological fracture: Secondary | ICD-10-CM | POA: Diagnosis not present

## 2020-05-19 DIAGNOSIS — Z7982 Long term (current) use of aspirin: Secondary | ICD-10-CM | POA: Diagnosis not present

## 2020-05-19 DIAGNOSIS — Z96641 Presence of right artificial hip joint: Secondary | ICD-10-CM | POA: Diagnosis not present

## 2020-05-19 DIAGNOSIS — M797 Fibromyalgia: Secondary | ICD-10-CM | POA: Diagnosis not present

## 2020-05-19 DIAGNOSIS — G4752 REM sleep behavior disorder: Secondary | ICD-10-CM | POA: Diagnosis not present

## 2020-05-19 DIAGNOSIS — G8929 Other chronic pain: Secondary | ICD-10-CM | POA: Diagnosis not present

## 2020-05-19 DIAGNOSIS — Z8781 Personal history of (healed) traumatic fracture: Secondary | ICD-10-CM | POA: Diagnosis not present

## 2020-05-19 DIAGNOSIS — R1312 Dysphagia, oropharyngeal phase: Secondary | ICD-10-CM | POA: Diagnosis not present

## 2020-05-19 DIAGNOSIS — R55 Syncope and collapse: Secondary | ICD-10-CM | POA: Diagnosis not present

## 2020-05-19 DIAGNOSIS — M0570 Rheumatoid arthritis with rheumatoid factor of unspecified site without organ or systems involvement: Secondary | ICD-10-CM | POA: Diagnosis not present

## 2020-05-19 DIAGNOSIS — Z79899 Other long term (current) drug therapy: Secondary | ICD-10-CM | POA: Diagnosis not present

## 2020-05-19 DIAGNOSIS — Z9181 History of falling: Secondary | ICD-10-CM | POA: Diagnosis not present

## 2020-05-19 DIAGNOSIS — E538 Deficiency of other specified B group vitamins: Secondary | ICD-10-CM | POA: Diagnosis not present

## 2020-05-19 DIAGNOSIS — E876 Hypokalemia: Secondary | ICD-10-CM | POA: Diagnosis not present

## 2020-05-19 DIAGNOSIS — M160 Bilateral primary osteoarthritis of hip: Secondary | ICD-10-CM | POA: Diagnosis not present

## 2020-05-19 DIAGNOSIS — G2581 Restless legs syndrome: Secondary | ICD-10-CM | POA: Diagnosis not present

## 2020-05-19 DIAGNOSIS — F419 Anxiety disorder, unspecified: Secondary | ICD-10-CM | POA: Diagnosis not present

## 2020-05-19 NOTE — Telephone Encounter (Signed)
Copied from [place] [phone] [phone]. Topic: General - Other >> May 19, 2020  3:31 PM Tessa Lerner A wrote: Reason for CRM: Colletta Maryland with [facility] has called to notify Dr. Rosanna Randy that patient's speech therapy evaluation scheduled for 05/21/20 has been delayed until 05/28/20 due to scheduling conflicts  Please contact to further discuss if needed

## 2020-05-22 DIAGNOSIS — G20 Parkinson's disease: Secondary | ICD-10-CM | POA: Diagnosis not present

## 2020-05-22 DIAGNOSIS — R1312 Dysphagia, oropharyngeal phase: Secondary | ICD-10-CM | POA: Diagnosis not present

## 2020-05-22 DIAGNOSIS — G8929 Other chronic pain: Secondary | ICD-10-CM | POA: Diagnosis not present

## 2020-05-22 DIAGNOSIS — I951 Orthostatic hypotension: Secondary | ICD-10-CM | POA: Diagnosis not present

## 2020-05-22 DIAGNOSIS — R55 Syncope and collapse: Secondary | ICD-10-CM | POA: Diagnosis not present

## 2020-05-22 DIAGNOSIS — M797 Fibromyalgia: Secondary | ICD-10-CM | POA: Diagnosis not present

## 2020-05-25 ENCOUNTER — Telehealth: Payer: Self-pay | Admitting: Family Medicine

## 2020-05-25 NOTE — Telephone Encounter (Signed)
Verbal ok given per Dr Rosanna Randy. Advised.

## 2020-05-25 NOTE — Telephone Encounter (Signed)
Home Health Verbal Orders -Terri Perez with [name] / Terri Perez calling Callback Number:[phone] /PT/Frequency: 2 wk 2 and 1 wk 6

## 2020-05-26 ENCOUNTER — Telehealth: Payer: Self-pay | Admitting: Family Medicine

## 2020-05-26 DIAGNOSIS — F418 Other specified anxiety disorders: Secondary | ICD-10-CM | POA: Diagnosis not present

## 2020-05-26 DIAGNOSIS — G2581 Restless legs syndrome: Secondary | ICD-10-CM | POA: Diagnosis not present

## 2020-05-26 DIAGNOSIS — G4752 REM sleep behavior disorder: Secondary | ICD-10-CM | POA: Diagnosis not present

## 2020-05-26 DIAGNOSIS — G20 Parkinson's disease: Secondary | ICD-10-CM | POA: Diagnosis not present

## 2020-05-26 NOTE — Telephone Encounter (Signed)
Home Health Verbal Orders - Caller/Agency: [place] Number: [phone] Requesting OT/PT/Skilled Nursing/Social Work/Speech Therapy: PT  Frequency:  Received a call from the husband, they are requesting to be discharged for home health.

## 2020-05-26 NOTE — Telephone Encounter (Signed)
ok 

## 2020-05-26 NOTE — Telephone Encounter (Signed)
Called and LM advising below

## 2020-05-28 ENCOUNTER — Other Ambulatory Visit: Payer: Self-pay | Admitting: Family Medicine

## 2020-05-28 MED ORDER — CARVEDILOL 3.125 MG PO TABS: 3.1250 mg | ORAL_TABLET | Freq: Two times a day (BID) | ORAL | 0 refills | Status: DC

## 2020-05-28 NOTE — Telephone Encounter (Signed)
  Notes to clinic:  medication filled by a different provider Review for refill    Requested Prescriptions  Pending Prescriptions Disp Refills   carvedilol (COREG) 3.125 MG tablet 30 tablet 0    Sig: Take 1 tablet (3.125 mg total) by mouth 2 (two) times daily with a meal.      Cardiovascular:  Beta Blockers Passed - 05/28/2020 10:46 AM      Passed - Last BP in normal range    BP Readings from Last 1 Encounters:  05/12/20 115/66          Passed - Last Heart Rate in normal range    Pulse Readings from Last 1 Encounters:  05/12/20 85          Passed - Valid encounter within last 6 months    Recent Outpatient Visits           2 weeks ago Orthostatic hypotension dysautonomic syndrome   [facility] Terri Perez., MD   6 months ago Parkinson disease [facility])   [facility] Terri Perez., MD   11 months ago Parkinson disease [facility])   [facility] Terri [facility]., MD   1 year ago Parkinson disease [facility])   [facility] Terri Perez., MD   2 years ago Bilateral impacted cerumen   [facility] Terri Perez., MD       Future Appointments             In 3 months Terri Perez., MD [facility]

## 2020-05-28 NOTE — Telephone Encounter (Signed)
Medication Refill - Medication: carvedilol (COREG) 3.125 MG tablet   Has the patient contacted their pharmacy? Yes.   (Agent: If no, request that the patient contact the pharmacy for the refill.) (Agent: If yes, when and what did the pharmacy advise?)fax was sent to office   Preferred Pharmacy (with phone number or street name): [facility] [place]  [address], [place] [address]  Phone:  [phone] Fax:  [phone]   Agent: Please be advised that RX refills may take up to 3 business days. We ask that you follow-up with your pharmacy.

## 2020-06-01 ENCOUNTER — Ambulatory Visit: Payer: Self-pay

## 2020-06-01 ENCOUNTER — Telehealth: Payer: Self-pay

## 2020-06-01 NOTE — Telephone Encounter (Signed)
Ronald was advised to have pt to stop taking carvedilol. Expressed understanding.  

## 2020-06-01 NOTE — Telephone Encounter (Signed)
Terri Perez was advised to have pt to stop taking carvedilol. Expressed understanding.

## 2020-06-01 NOTE — Telephone Encounter (Signed)
Copied from [place] [id number]. Topic: General - Other >> Jun 01, 2020  9:11 AM Tessa Lerner A wrote: Reason for CRM: Patient has been prescribed carvedilol (COREG) 3.125 MG tablet [[id number]]   Frequency: 2 times daily with meals Dispense Quantity: 30 tablet  Refills: 0    Patient's husband would like the medication prescribed with a quantity of 60(sixty) tablets rather than 30  Patient's husband would also like to be contacted to further discuss the refill status of the medication  Please contact to further advise when possible

## 2020-06-01 NOTE — Telephone Encounter (Signed)
LMOVM for patient's husband to return call. Okay for [facility] triage. See other phone message.

## 2020-06-01 NOTE — Telephone Encounter (Addendum)
LMOVM for patient's husband to return call. Okay for [facility] triage.

## 2020-06-01 NOTE — Telephone Encounter (Signed)
[name] stated pt passed out this am while walking and spouse called EMS. EMS BP's:  89/54/  100/70  113/69  121/63 94% HR 86. Spouse refused EMS to take her to ER due to worry of wait time. Wife asked to talk to NT. Pt was alert and oriented during call and recalled events from this am. Denies any dizziness or lightheadedness during call.  Pt stated she wants to talk to Dr Rosanna Randy about adjusting her carvedilol. Appt made for pt previous to transfer to NT for Wednesday. Pt asking for directions prior to appt about adjusting med.   Pt denies any injuries for fall. Pt stated she has passed out few times lately. Reason for Disposition . [2] Systolic BP 09-470 AND [9] taking blood pressure medications AND [3] NOT dizzy, lightheaded or weak  Additional Information . Commented on: Fainted    EMS was called and refused to go to ED . Commented on: [6] Systolic BP 28-366 AND [2] taking blood pressure medications AND [3] dizzy, lightheaded or weak    Syncopal episode this am  Answer Assessment - Initial Assessment Questions 1. BLOOD PRESSURE: "What is the blood pressure?" "Did you take at least two measurements 5 minutes apart?"   89/54 _ on the floor 100/70  113/69  121/63 2. ONSET: "When did you take your blood pressure?"     121/63 3. HOW: "How did you obtain the blood pressure?" (e.g., visiting nurse, automatic home BP monitor)     EMS 4. HISTORY: "Do you have a history of low blood pressure?" "What is your blood pressure normally?"    no 5. MEDICATIONS: "Are you taking any medications for blood pressure?" If Yes, ask: "Have they been changed recently?"     yes 6. PULSE RATE: "Do you know what your pulse rate is?"      86 7. OTHER SYMPTOMS: "Have you been sick recently?" "Have you had a recent injury?"     no  Protocols used: BLOOD PRESSURE - LOW-A-AH

## 2020-06-01 NOTE — Telephone Encounter (Signed)
Stop carvedilol

## 2020-06-03 ENCOUNTER — Ambulatory Visit (INDEPENDENT_AMBULATORY_CARE_PROVIDER_SITE_OTHER): Payer: Medicare Other | Admitting: Family Medicine

## 2020-06-03 ENCOUNTER — Other Ambulatory Visit: Payer: Self-pay

## 2020-06-03 ENCOUNTER — Encounter: Payer: Self-pay | Admitting: Family Medicine

## 2020-06-03 VITALS — BP 140/87 | HR 101 | Temp 98.3°F | Resp 16 | Wt 197.0 lb

## 2020-06-03 DIAGNOSIS — C50419 Malignant neoplasm of upper-outer quadrant of unspecified female breast: Secondary | ICD-10-CM

## 2020-06-03 DIAGNOSIS — N1831 Chronic kidney disease, stage 3a: Secondary | ICD-10-CM | POA: Diagnosis not present

## 2020-06-03 DIAGNOSIS — R42 Dizziness and giddiness: Secondary | ICD-10-CM | POA: Diagnosis not present

## 2020-06-03 DIAGNOSIS — G20 Parkinson's disease: Secondary | ICD-10-CM | POA: Diagnosis not present

## 2020-06-03 DIAGNOSIS — K5909 Other constipation: Secondary | ICD-10-CM | POA: Diagnosis not present

## 2020-06-03 DIAGNOSIS — M8080XG Other osteoporosis with current pathological fracture, unspecified site, subsequent encounter for fracture with delayed healing: Secondary | ICD-10-CM

## 2020-06-03 DIAGNOSIS — I951 Orthostatic hypotension: Secondary | ICD-10-CM | POA: Diagnosis not present

## 2020-06-03 DIAGNOSIS — M158 Other polyosteoarthritis: Secondary | ICD-10-CM

## 2020-06-03 DIAGNOSIS — C50912 Malignant neoplasm of unspecified site of left female breast: Secondary | ICD-10-CM

## 2020-06-03 MED ORDER — FLUDROCORTISONE ACETATE 0.1 MG PO TABS: 0.1000 mg | ORAL_TABLET | Freq: Every day | ORAL | 3 refills | Status: DC

## 2020-06-03 NOTE — Progress Notes (Signed)
Established patient visit   Patient: Terri Perez   DOB: [birth year]   [name]  MRN: [id number] Visit Date: 06/03/2020  Today's healthcare provider: [name], MD   Chief Complaint  Patient presents with  . Fatigue   Subjective    HPI  Patient presents today c/o weakness and syncopal episodes. Patient's husband reports that she passed out last week and EMS had to come and help her up. She did not go to the ER for evaluation.   It was recommended that she discontinue the carvedilol due to contributing to her weakness and syncopal episodes. BP readings at home has been "low" per patient and husband. She has not passed out since last week.   BP Readings from Last 3 Encounters:  06/03/20 140/87  05/12/20 115/66  04/21/20 138/64   Wt Readings from Last 3 Encounters:  06/03/20 197 lb (89.4 kg)  05/12/20 199 lb (90.3 kg)  04/21/20 201 lb 1 oz (91.2 kg)         Medications: Outpatient Medications Prior to Visit  Medication Sig  . ALPRAZolam (XANAX) 0.5 MG tablet Take 1 tablet (0.5 mg total) by mouth 3 (three) times daily as needed.  Marland Kitchen aspirin EC 81 MG EC tablet Take 1 tablet (81 mg total) by mouth daily. Swallow whole.  Marland Kitchen atorvastatin (LIPITOR) 20 MG tablet Take 1 tablet (20 mg total) by mouth daily.  . carbidopa-levodopa (SINEMET IR) 25-100 MG tablet Take 2 tablets by mouth 3 (three) times daily.  . carvedilol (COREG) 3.125 MG tablet Take 1 tablet (3.125 mg total) by mouth 2 (two) times daily with a meal. (Patient not taking: Reported on 06/03/2020)  . ondansetron (ZOFRAN-ODT) 4 MG disintegrating tablet Take 1 tablet (4 mg total) by mouth every 8 (eight) hours as needed for nausea or vomiting.  . pantoprazole (PROTONIX) 40 MG tablet Take 1 tablet (40 mg total) by mouth daily.  . traMADol (ULTRAM) 50 MG tablet Take 1 tablet (50 mg total) by mouth every 12 (twelve) hours as needed for moderate pain.  . vitamin B-12 (CYANOCOBALAMIN) 1000 MCG tablet Take 1  tablet (1,000 mcg total) by mouth daily.   No facility-administered medications prior to visit.    Review of Systems  Constitutional: Negative for appetite change, chills, fatigue and fever.  Respiratory: Negative for chest tightness and shortness of breath.   Cardiovascular: Negative for chest pain and palpitations.  Gastrointestinal: Negative for abdominal pain, nausea and vomiting.  Neurological: Negative for dizziness and weakness.        Objective    BP 140/87   Pulse (!) 101   Temp 98.3 F (36.8 C)   Resp 16   Wt 197 lb (89.4 kg) Comment: per patient  SpO2 96%   BMI 30.85 kg/m  Wt Readings from Last 3 Encounters:  06/03/20 197 lb (89.4 kg)  05/12/20 199 lb (90.3 kg)  04/21/20 201 lb 1 oz (91.2 kg)       Physical Exam Vitals reviewed.  Constitutional:      Appearance: She is well-developed. She is obese.  HENT:     Head: Normocephalic and atraumatic.  Eyes:     General: No scleral icterus.    Conjunctiva/sclera: Conjunctivae normal.  Cardiovascular:     Rate and Rhythm: Normal rate and regular rhythm.     Heart sounds: Normal heart sounds.  Pulmonary:     Effort: Pulmonary effort is normal.     Breath sounds: Normal breath  sounds.  Abdominal:     General: Bowel sounds are normal.     Palpations: Abdomen is soft.  Musculoskeletal:     Cervical back: Normal range of motion and neck supple.     Comments: +1 edema bilaterally   Skin:    General: Skin is warm and dry.  Neurological:     Mental Status: She is alert and oriented to person, place, and time.     Comments: Masked facies consistent with parkinsonism  Psychiatric:        Mood and Affect: Mood normal.        Behavior: Behavior normal.        Thought Content: Thought content normal.        Judgment: Judgment normal.       No results found for any visits on 06/03/20.  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  Dispense: 30 tablet; Refill: 3  2. Dizziness Try Florinef as above.  3. Parkinson's disease [facility]) Per neurology.  4. Stage 3a chronic kidney disease (HCC)   5. Other osteoarthritis involving multiple joints Patient complains of chronic groin discomfort.  6. Other osteoporosis with current pathological fracture with delayed healing, subsequent encounter   7. Malignant neoplasm of left female breast, unspecified estrogen receptor status, unspecified site of breast ([place]) History of breast cancer  8. 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   No follow-ups on file.      I, [name], MD, have reviewed all documentation for this visit. The documentation on 06/06/20 for the exam, diagnosis, procedures, and orders are all accurate and complete.    Tucker Steedley Cranford Mon, MD  [facility] [phone] (phone) [phone] (fax)  [place]

## 2020-06-03 NOTE — Patient Instructions (Signed)
Can go to 2 Tramadol at a time.   Try Norton Blizzard or Omiron blood pressure cuff to monitor BP at home.   Try Tumeric daily.

## 2020-06-17 ENCOUNTER — Ambulatory Visit: Payer: Medicare Other | Admitting: Family Medicine

## 2020-07-13 ENCOUNTER — Ambulatory Visit: Payer: Self-pay | Admitting: Family Medicine

## 2020-07-29 ENCOUNTER — Other Ambulatory Visit: Payer: Self-pay | Admitting: Family Medicine

## 2020-07-29 ENCOUNTER — Other Ambulatory Visit: Payer: Self-pay

## 2020-07-29 ENCOUNTER — Inpatient Hospital Stay
Admission: EM | Admit: 2020-07-29 | Discharge: 2020-08-03 | DRG: 690 | Disposition: A | Discharge status: Skilled Nursing Facility | Payer: Medicare Other | Attending: Internal Medicine | Admitting: Internal Medicine

## 2020-07-29 DIAGNOSIS — I951 Orthostatic hypotension: Secondary | ICD-10-CM

## 2020-07-29 DIAGNOSIS — E876 Hypokalemia: Secondary | ICD-10-CM | POA: Diagnosis present

## 2020-07-29 DIAGNOSIS — Z91018 Allergy to other foods: Secondary | ICD-10-CM

## 2020-07-29 DIAGNOSIS — M810 Age-related osteoporosis without current pathological fracture: Secondary | ICD-10-CM | POA: Diagnosis present

## 2020-07-29 DIAGNOSIS — G909 Disorder of the autonomic nervous system, unspecified: Secondary | ICD-10-CM | POA: Diagnosis not present

## 2020-07-29 DIAGNOSIS — F329 Major depressive disorder, single episode, unspecified: Secondary | ICD-10-CM | POA: Diagnosis present

## 2020-07-29 DIAGNOSIS — F419 Anxiety disorder, unspecified: Secondary | ICD-10-CM | POA: Diagnosis present

## 2020-07-29 DIAGNOSIS — K219 Gastro-esophageal reflux disease without esophagitis: Secondary | ICD-10-CM | POA: Diagnosis present

## 2020-07-29 DIAGNOSIS — Z923 Personal history of irradiation: Secondary | ICD-10-CM

## 2020-07-29 DIAGNOSIS — R531 Weakness: Secondary | ICD-10-CM

## 2020-07-29 DIAGNOSIS — Z23 Encounter for immunization: Secondary | ICD-10-CM | POA: Diagnosis not present

## 2020-07-29 DIAGNOSIS — Z85038 Personal history of other malignant neoplasm of large intestine: Secondary | ICD-10-CM

## 2020-07-29 DIAGNOSIS — R52 Pain, unspecified: Secondary | ICD-10-CM | POA: Diagnosis not present

## 2020-07-29 DIAGNOSIS — N183 Chronic kidney disease, stage 3 unspecified: Secondary | ICD-10-CM | POA: Diagnosis present

## 2020-07-29 DIAGNOSIS — G20A1 Parkinson's disease without dyskinesia, without mention of fluctuations: Secondary | ICD-10-CM | POA: Diagnosis present

## 2020-07-29 DIAGNOSIS — Z885 Allergy status to narcotic agent status: Secondary | ICD-10-CM

## 2020-07-29 DIAGNOSIS — I10 Essential (primary) hypertension: Secondary | ICD-10-CM | POA: Diagnosis not present

## 2020-07-29 DIAGNOSIS — G8929 Other chronic pain: Secondary | ICD-10-CM | POA: Diagnosis present

## 2020-07-29 DIAGNOSIS — Z853 Personal history of malignant neoplasm of breast: Secondary | ICD-10-CM

## 2020-07-29 DIAGNOSIS — R5381 Other malaise: Secondary | ICD-10-CM | POA: Diagnosis not present

## 2020-07-29 DIAGNOSIS — Z96643 Presence of artificial hip joint, bilateral: Secondary | ICD-10-CM | POA: Diagnosis present

## 2020-07-29 DIAGNOSIS — Z7982 Long term (current) use of aspirin: Secondary | ICD-10-CM

## 2020-07-29 DIAGNOSIS — Z91011 Allergy to milk products: Secondary | ICD-10-CM

## 2020-07-29 DIAGNOSIS — R42 Dizziness and giddiness: Secondary | ICD-10-CM | POA: Diagnosis present

## 2020-07-29 DIAGNOSIS — R0902 Hypoxemia: Secondary | ICD-10-CM | POA: Diagnosis not present

## 2020-07-29 DIAGNOSIS — G903 Multi-system degeneration of the autonomic nervous system: Secondary | ICD-10-CM

## 2020-07-29 DIAGNOSIS — Z79899 Other long term (current) drug therapy: Secondary | ICD-10-CM

## 2020-07-29 DIAGNOSIS — N390 Urinary tract infection, site not specified: Principal | ICD-10-CM | POA: Diagnosis present

## 2020-07-29 DIAGNOSIS — Z91048 Other nonmedicinal substance allergy status: Secondary | ICD-10-CM

## 2020-07-29 DIAGNOSIS — G20 Parkinson's disease: Secondary | ICD-10-CM | POA: Diagnosis present

## 2020-07-29 DIAGNOSIS — Z20822 Contact with and (suspected) exposure to covid-19: Secondary | ICD-10-CM | POA: Diagnosis present

## 2020-07-29 DIAGNOSIS — M069 Rheumatoid arthritis, unspecified: Secondary | ICD-10-CM | POA: Diagnosis not present

## 2020-07-29 DIAGNOSIS — Z886 Allergy status to analgesic agent status: Secondary | ICD-10-CM

## 2020-07-29 DIAGNOSIS — R9431 Abnormal electrocardiogram [ECG] [EKG]: Secondary | ICD-10-CM | POA: Diagnosis not present

## 2020-07-29 DIAGNOSIS — Z888 Allergy status to other drugs, medicaments and biological substances status: Secondary | ICD-10-CM

## 2020-07-29 DIAGNOSIS — Z9221 Personal history of antineoplastic chemotherapy: Secondary | ICD-10-CM

## 2020-07-29 DIAGNOSIS — M797 Fibromyalgia: Secondary | ICD-10-CM | POA: Diagnosis present

## 2020-07-29 DIAGNOSIS — Z85828 Personal history of other malignant neoplasm of skin: Secondary | ICD-10-CM

## 2020-07-29 DIAGNOSIS — Z882 Allergy status to sulfonamides status: Secondary | ICD-10-CM

## 2020-07-29 DIAGNOSIS — M25551 Pain in right hip: Secondary | ICD-10-CM | POA: Diagnosis present

## 2020-07-29 LAB — BASIC METABOLIC PANEL
Anion gap: 8 (ref 5–15)
BUN: 17 mg/dL (ref 8–23)
CO2: 30 mmol/L (ref 22–32)
Calcium: 10.2 mg/dL (ref 8.9–10.3)
Chloride: 101 mmol/L (ref 98–111)
Creatinine, Ser: 0.81 mg/dL (ref 0.44–1.00)
GFR, Estimated: >60 mL/min (ref >60)
Glucose, Bld: 103 mg/dL — ABNORMAL HIGH (ref 70–99)
Potassium: 3 mmol/L — ABNORMAL LOW (ref 3.5–5.1)
Sodium: 139 mmol/L (ref 135–145)

## 2020-07-29 LAB — CBC
HCT: 39.8 % (ref 36.0–46.0)
Hemoglobin: 13.3 g/dL (ref 12.0–15.0)
MCH: 31.8 pg (ref 26.0–34.0)
MCHC: 33.4 g/dL (ref 30.0–36.0)
MCV: 95.2 fL (ref 80.0–100.0)
Platelets: 218 K/uL (ref 150–400)
RBC: 4.18 MIL/uL (ref 3.87–5.11)
RDW: 13.5 % (ref 11.5–15.5)
WBC: 7.5 K/uL (ref 4.0–10.5)
nRBC: 0 % (ref 0.0–0.2)

## 2020-07-29 LAB — URINALYSIS, COMPLETE (UACMP) WITH MICROSCOPIC
Bacteria, UA: NONE SEEN
Bilirubin Urine: NEGATIVE
Glucose, UA: NEGATIVE mg/dL
Hgb urine dipstick: NEGATIVE
Ketones, ur: NEGATIVE mg/dL
Nitrite: NEGATIVE
Protein, ur: NEGATIVE mg/dL
Specific Gravity, Urine: 1.005 (ref 1.005–1.030)
pH: 9 — ABNORMAL HIGH (ref 5.0–8.0)

## 2020-07-29 MED ORDER — VITAMIN B-12 1000 MCG PO TABS
1000.0000 ug | ORAL_TABLET | Freq: Every day | ORAL | Status: DC
  Administered 2020-07-30 – 2020-08-03 (×5): 1000 ug via ORAL
  Filled 2020-07-29 (×6): qty 1

## 2020-07-29 MED ORDER — KETAMINE HCL 10 MG/ML IJ SOLN: 0.3000 mg/kg | Freq: Once | INTRAMUSCULAR | Status: DC

## 2020-07-29 MED ORDER — DULOXETINE HCL 30 MG PO CPEP
30.0000 mg | ORAL_CAPSULE | Freq: Every day | ORAL | Status: DC
  Administered 2020-07-30 – 2020-08-03 (×5): 30 mg via ORAL
  Filled 2020-07-29 (×6): qty 1

## 2020-07-29 MED ORDER — FLUDROCORTISONE ACETATE 0.1 MG PO TABS
0.1000 mg | ORAL_TABLET | Freq: Every day | ORAL | Status: DC
  Administered 2020-07-30 – 2020-08-03 (×5): 0.1 mg via ORAL
  Filled 2020-07-29 (×5): qty 1

## 2020-07-29 MED ORDER — CARVEDILOL 3.125 MG PO TABS
3.1250 mg | ORAL_TABLET | Freq: Two times a day (BID) | ORAL | Status: DC
  Administered 2020-07-30 – 2020-08-02 (×7): 3.125 mg via ORAL
  Filled 2020-07-29 (×7): qty 1

## 2020-07-29 MED ORDER — ATORVASTATIN CALCIUM 20 MG PO TABS
20.0000 mg | ORAL_TABLET | Freq: Every day | ORAL | Status: DC
  Administered 2020-07-30 – 2020-08-03 (×5): 20 mg via ORAL
  Filled 2020-07-29 (×5): qty 1

## 2020-07-29 MED ORDER — ONDANSETRON 4 MG PO TBDP: 4.0000 mg | ORAL_TABLET | Freq: Three times a day (TID) | ORAL | Status: DC | PRN

## 2020-07-29 MED ORDER — ALPRAZOLAM 0.5 MG PO TABS
0.5000 mg | ORAL_TABLET | Freq: Three times a day (TID) | ORAL | Status: DC | PRN
  Administered 2020-07-29 – 2020-08-03 (×11): 0.5 mg via ORAL
  Filled 2020-07-29 (×13): qty 1

## 2020-07-29 MED ORDER — ASPIRIN EC 81 MG PO TBEC
81.0000 mg | DELAYED_RELEASE_TABLET | Freq: Every day | ORAL | Status: DC
  Administered 2020-07-30 – 2020-08-03 (×5): 81 mg via ORAL
  Filled 2020-07-29 (×5): qty 1

## 2020-07-29 MED ORDER — ROPINIROLE HCL 1 MG PO TABS
4.0000 mg | ORAL_TABLET | Freq: Every day | ORAL | Status: DC
  Administered 2020-07-29 – 2020-08-02 (×5): 4 mg via ORAL
  Filled 2020-07-29 (×5): qty 4

## 2020-07-29 MED ORDER — CARBIDOPA-LEVODOPA 25-100 MG PO TABS
2.0000 | ORAL_TABLET | Freq: Three times a day (TID) | ORAL | Status: DC
  Administered 2020-07-29 – 2020-08-03 (×15): 2 via ORAL
  Filled 2020-07-29 (×15): qty 2

## 2020-07-29 MED ORDER — POTASSIUM CHLORIDE CRYS ER 20 MEQ PO TBCR
40.0000 meq | EXTENDED_RELEASE_TABLET | Freq: Once | ORAL | Status: AC
  Administered 2020-07-30: 40 meq via ORAL
  Filled 2020-07-29: qty 2

## 2020-07-29 MED ORDER — PANTOPRAZOLE SODIUM 40 MG PO TBEC
40.0000 mg | DELAYED_RELEASE_TABLET | Freq: Every day | ORAL | Status: DC
  Administered 2020-07-30 – 2020-08-03 (×5): 40 mg via ORAL
  Filled 2020-07-29 (×5): qty 1

## 2020-07-29 NOTE — ED Notes (Signed)
Husband contacted by this rn.

## 2020-07-29 NOTE — ED Triage Notes (Signed)
Pt c/o increased weakness for the past month pt has parkinson's, has intermittently been in rehab and home health, husband wishes to speak with someone about possible long term placement.

## 2020-07-29 NOTE — ED Notes (Signed)
Pt brought in by spouse today with weakness.  Hx parkinson's.  Pt reports no pain at this time.  Pt states I have a hard time getting my thoughts out.  purewick in place.  Pt alert.  siderails up x 2.

## 2020-07-29 NOTE — Telephone Encounter (Signed)
Requested medication (s) are due for refill today: no  Requested medication (s) are on the active medication list: yes   Last refill:  07/24/2020  Future visit scheduled: yes   Notes to clinic:  This refill cannot be delegated   Requested Prescriptions  Pending Prescriptions Disp Refills   fludrocortisone (FLORINEF) 0.1 MG tablet [Pharmacy Med Name: FLUDROCORTISONE 0.1 MG TABLET] 30 tablet 3    Sig: TAKE ONE TABLET BY MOUTH DAILY      Not Delegated - Endocrinology: Oral Corticosteroids - fludrocortisone Failed - 07/29/2020  9:19 AM      Failed - This refill cannot be delegated      Failed - Last BP in normal range    BP Readings from Last 1 Encounters:  06/03/20 140/87          Passed - K in normal range and within 180 days    Potassium  Date Value Ref Range Status  04/19/2020 4.1 3.5 - 5.1 mmol/L Final  05/15/2014 4.3 mmol/L Final    Comment:    3.5-5.1 NOTE: New Reference Range  04/01/14           Passed - Na in normal range and within 180 days    Sodium  Date Value Ref Range Status  04/19/2020 137 135 - 145 mmol/L Final  07/03/2019 140 134 - 144 mmol/L Final  05/15/2014 137 mmol/L Final    Comment:    135-145 NOTE: New Reference Range  04/01/14           Passed - Valid encounter within last 6 months    Recent Outpatient Visits           1 month ago Orthostatic hypotension dysautonomic syndrome   [facility] Jerrol Banana., MD   2 months ago Orthostatic hypotension dysautonomic syndrome   [facility] Jerrol Banana., MD   8 months ago Parkinson disease [facility])   [facility] Jerrol Banana., MD   1 year ago Parkinson disease [facility])   [facility] Jerrol Banana., MD   1 year ago Parkinson disease [facility])   [facility] Jerrol Banana., MD       Future Appointments             In 2 weeks Jerrol Banana., MD [facility], [place]    In 1 month Jerrol Banana., MD [facility] [place], [place]

## 2020-07-29 NOTE — ED Provider Notes (Signed)
[facility] Emergency Department Provider Note   ____________________________________________   Event Date/Time   First MD Initiated Contact with Patient 07/29/20 2050     (approximate)  I have reviewed the triage vital signs and the nursing notes.   HISTORY  Chief Complaint Weakness and Care Coordination    HPI Terri Perez is a 75 y.o. female with past medical history of Parkinson disease, rheumatoid arthritis, CKD, breast cancer, and fibromyalgia who presents to the ED complaining of generalized weakness.  Patient reports that she is here so that she can be placed in a nursing facility as she has become too weak for her husband to care for her at home.  In the past, she has been able to get around using a walker but she now states she is unable to go more than a couple of steps.  She denies any fevers, cough, chest pain, shortness of breath, dysuria, flank pain, vomiting, or diarrhea.  She reports being told to come to the ER so that she could be placed in a facility.        Past Medical History:  Diagnosis Date   Arm fracture    right forearm, d/t fall   Bowel trouble 2010   Breast cancer ([place]) 12/2009   LEFT LUMPECTOMY   Cancer ([place]) 12/2009   Left UOQ breast tumor; wide excision,sn bx and whole breast radiation. Chemotherapy done. There was only a 42m area of residual tumor remaining. All sn were negative. This was ER-positive, PR-negative, HER-2 neu not over expressed tumor. The original ultrasound size was 2.6 cm(T2).   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 On wide excision 3 mm area of residual tumor. Sentinel node negative. ER-30%, PR-negative, HER-2 not overexpressing.   [place]inson disease [place] 04/2016   Dr SManuella Ghaziis pt s DR   Personal history of chemotherapy 2012   BREAST CA   Personal history of fibromyalgia 2002    Personal history of malignant neoplasm of large intestine    Personal history of radiation therapy 2012   BREAST CA   Special screening for malignant neoplasms, colon    Syncope    a. 03/2020 Echo: EF 50-55%, Gr1 DD, nl RV fxn, triv MR.   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   Fracture of sacrum ([place] 08/04/2016   Adiposity 05/05/2016   [place]inson disease ([place] 04/25/2016   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   Bilateral lower extremity edema 07/23/2015   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   Chronic kidney disease (CKD), stage III (moderate) ([place] 05/11/2015   Elevated rheumatoid factor 05/11/2015   Primary osteoarthritis of both hips 05/11/2015   Syncope 04/24/2015   History of operative procedure on hip 04/24/2015   Restless leg 04/24/2015   Osteoporosis 03/30/2015   Insomnia 03/30/2015   Skin cancer 03/30/2015   MDD (major depressive disorder) 03/30/2015   Migraine 03/30/2015   GERD (gastroesophageal reflux disease) 03/30/2015   IBS (irritable bowel syndrome) 03/30/2015   OA (  osteoarthritis) 03/30/2015   Skin lesion of chest wall 07/17/2014   Closed fracture of shaft of femur ([place]) 04/01/2014   Fracture of bone adjacent to prosthesis 04/01/2014   Breast cancer ([place]) 03/07/2013   Malignant neoplasm of upper-outer quadrant of female breast ([place])    Acute anxiety 06/11/2008   CONSTIPATION, CHRONIC 06/11/2008   Fibromyalgia 06/11/2008    Past Surgical History:  Procedure Laterality Date   Vonore   bone spurs  between 5&6, used bone from left hip   BREAST BIOPSY Left 2011   POS   BREAST CYST ASPIRATION Left 1985   BREAST LUMPECTOMY Left 2011   BREAST SURGERY Left 2011   wide excision   CERVICAL DISCECTOMY     COLON RESECTION  Sept 2014   [facility]    COLONOSCOPY  2010   Dr. Allen Norris, normal   FEMUR FRACTURE SURGERY Right March 2016   JOINT REPLACEMENT Bilateral Jan 2016 and March 2016   hip   PORT-A-CATH REMOVAL  2013   PORTACATH PLACEMENT  2011   SKIN CANCER EXCISION  1980   face   TUBAL LIGATION  1973   UPPER GI ENDOSCOPY  08/20/07   gastritis without hemorrhage    Prior to Admission medications   Medication Sig Start Date End Date Taking? Authorizing Provider  ALPRAZolam Duanne Moron) 0.5 MG tablet Take 1 tablet (0.5 mg total) by mouth 3 (three) times daily as needed. 04/21/20  Yes Sharen Hones, MD  aspirin EC 81 MG EC tablet Take 1 tablet (81 mg total) by mouth daily. Swallow whole. 04/16/20  Yes Sharen Hones, MD  carbidopa-levodopa (SINEMET IR) 25-100 MG tablet Take 2 tablets by mouth 3 (three) times daily. 04/15/20  Yes Sharen Hones, MD  DULoxetine (CYMBALTA) 30 MG capsule Take 30 mg by mouth daily. 05/26/20  Yes [provider]  fludrocortisone (FLORINEF) 0.1 MG tablet Take 1 tablet (0.1 mg total) by mouth daily. 06/03/20  Yes Jerrol Banana., MD  rOPINIRole (REQUIP) 1 MG tablet Take 4 mg by mouth at bedtime.   Yes [provider]  vitamin B-12 (CYANOCOBALAMIN) 1000 MCG tablet Take 1 tablet (1,000 mcg total) by mouth daily. 04/15/20  Yes Sharen Hones, MD  atorvastatin (LIPITOR) 20 MG tablet Take 1 tablet (20 mg total) by mouth daily. 04/15/20 05/15/20  Sharen Hones, MD  carvedilol (COREG) 3.125 MG tablet Take 1 tablet (3.125 mg total) by mouth 2 (two) times daily with a meal. Patient not taking: No sig reported 05/28/20   Jerrol Banana., MD  ondansetron (ZOFRAN-ODT) 4 MG disintegrating tablet Take 1 tablet (4 mg total) by mouth every 8 (eight) hours as needed for nausea  or vomiting. 11/04/19   Jerrol Banana., MD  pantoprazole (PROTONIX) 40 MG tablet Take 1 tablet (40 mg total) by mouth daily. 04/15/20 05/15/20  Sharen Hones, MD    Allergies Alendronate, Gluten meal, Lactose intolerance (gi), Nsaids, Other, Oxycodone-acetaminophen, Oxycodone-acetaminophen, Vicodin [hydrocodone-acetaminophen], Wheat bran, Sulfa antibiotics, and Tape  Family History  Problem Relation Age of Onset   Cancer Brother        colon   Cancer Other        breast and ovarian cancers, relationships not listed   Stroke Mother    Osteoporosis Mother    Depression Sister    Depression Sister    Depression Sister    Cancer Other 47       niece   Breast  cancer Neg Hx     Social History Social History   Tobacco Use   Smoking status: Never   Smokeless tobacco: Never  Substance Use Topics   Alcohol use: No   Drug use: No    Review of Systems  Constitutional: No fever/chills.  Positive for generalized weakness. Eyes: No visual changes. ENT: No sore throat. Cardiovascular: Denies chest pain. Respiratory: Denies shortness of breath. Gastrointestinal: No abdominal pain.  No nausea, no vomiting.  No diarrhea.  No constipation. Genitourinary: Negative for dysuria. Musculoskeletal: Negative for back pain. Skin: Negative for rash. Neurological: Negative for headaches, focal weakness or numbness.  ____________________________________________   PHYSICAL EXAM:  VITAL SIGNS: ED Triage Vitals  Enc Vitals Group     BP 07/29/20 1607 (!) 171/88     Pulse Rate 07/29/20 1607 93     Resp 07/29/20 1607 19     Temp 07/29/20 1607 98.5 F (36.9 C)     Temp Source 07/29/20 1607 Oral     SpO2 07/29/20 1607 94 %     Weight --      Height --      Head Circumference --      Peak Flow --      Pain Score 07/29/20 1557 0     Pain Loc --      Pain Edu? --      Excl. in [place]? --     Constitutional: Alert and oriented. Eyes: Conjunctivae are normal. Head: Atraumatic. Nose:  No congestion/rhinnorhea. Mouth/Throat: Mucous membranes are moist. Neck: Normal ROM Cardiovascular: Normal rate, regular rhythm. Grossly normal heart sounds.  2+ radial pulses bilaterally. Respiratory: Normal respiratory effort.  No retractions. Lungs CTAB. Gastrointestinal: Soft and nontender. No distention. Genitourinary: deferred Musculoskeletal: No lower extremity tenderness nor edema. Neurologic:  Normal speech and language. No gross focal neurologic deficits are appreciated. Skin:  Skin is warm, dry and intact. No rash noted. Psychiatric: Mood and affect are normal. Speech and behavior are normal.  ____________________________________________   LABS (all labs ordered are listed, but only abnormal results are displayed)  Labs Reviewed  BASIC METABOLIC PANEL - Abnormal; Notable for the following components:      Result Value   Potassium 3.0 (*)    Glucose, Bld 103 (*)    All other components within normal limits  URINALYSIS, COMPLETE (UACMP) WITH MICROSCOPIC - Abnormal; Notable for the following components:   Color, Urine STRAW (*)    APPearance HAZY (*)    pH 9.0 (*)    Leukocytes,Ua MODERATE (*)    All other components within normal limits  RESP PANEL BY RT-PCR (FLU A&B, COVID) ARPGX2  CBC   ____________________________________________  EKG  ED ECG REPORT I, Blake Divine, the attending physician, personally viewed and interpreted this ECG.   Date: 07/29/2020  EKG Time: 16:13  Rate: 93  Rhythm: normal sinus rhythm  Axis: RAD  Intervals:none  ST&T Change: None   PROCEDURES  Procedure(s) performed (including Critical Care):  Procedures   ____________________________________________   INITIAL IMPRESSION / ASSESSMENT AND PLAN / ED COURSE      75 year old female with past medical history of Parkinson's disease, rheumatoid arthritis, CKD, breast cancer, and fibromyalgia who presents to the ED with generalized weakness and difficulty caring for herself  at home.  Patient denies any complaints beyond generalized weakness and vital signs are unremarkable.  She has no focal neurologic deficits on exam.  EKG shows no evidence of arrhythmia or ischemia, labs remarkable for mild hypokalemia but  otherwise reassuring.  UA shows no signs of infection.  We will consult physical therapy as well as social work for potential placement.  Patient's home medications have been verified and ordered.      ____________________________________________   FINAL CLINICAL IMPRESSION(S) / ED DIAGNOSES  Final diagnoses:  Parkinson disease [facility])     ED Discharge Orders     None        Note:  This document was prepared using Dragon voice recognition software and may include unintentional dictation errors.    Blake Divine, MD 07/29/20 [phone]

## 2020-07-30 DIAGNOSIS — N390 Urinary tract infection, site not specified: Secondary | ICD-10-CM | POA: Diagnosis present

## 2020-07-30 DIAGNOSIS — I951 Orthostatic hypotension: Secondary | ICD-10-CM

## 2020-07-30 DIAGNOSIS — R531 Weakness: Secondary | ICD-10-CM | POA: Diagnosis not present

## 2020-07-30 DIAGNOSIS — G20 Parkinson's disease: Secondary | ICD-10-CM

## 2020-07-30 LAB — RESP PANEL BY RT-PCR (FLU A&B, COVID) ARPGX2
Influenza A by PCR: NEGATIVE
Influenza B by PCR: NEGATIVE
SARS Coronavirus 2 by RT PCR: NEGATIVE

## 2020-07-30 LAB — MAGNESIUM: Magnesium: 1.5 mg/dL — ABNORMAL LOW (ref 1.7–2.4)

## 2020-07-30 MED ORDER — ENOXAPARIN SODIUM 40 MG/0.4ML IJ SOSY
40.0000 mg | PREFILLED_SYRINGE | INTRAMUSCULAR | Status: DC
  Administered 2020-07-31 – 2020-08-03 (×4): 40 mg via SUBCUTANEOUS
  Filled 2020-07-30 (×4): qty 0.4

## 2020-07-30 MED ORDER — POTASSIUM CHLORIDE CRYS ER 20 MEQ PO TBCR
40.0000 meq | EXTENDED_RELEASE_TABLET | Freq: Once | ORAL | Status: AC
  Administered 2020-07-30: 40 meq via ORAL
  Filled 2020-07-30: qty 2

## 2020-07-30 MED ORDER — SODIUM CHLORIDE 0.9 % IV BOLUS
500.0000 mL | Freq: Once | INTRAVENOUS | Status: AC
  Administered 2020-07-30: 500 mL via INTRAVENOUS

## 2020-07-30 MED ORDER — ACETAMINOPHEN 325 MG PO TABS: 650.0000 mg | ORAL_TABLET | Freq: Four times a day (QID) | ORAL | Status: DC | PRN

## 2020-07-30 MED ORDER — ACETAMINOPHEN 650 MG RE SUPP: 650.0000 mg | Freq: Four times a day (QID) | RECTAL | Status: DC | PRN

## 2020-07-30 MED ORDER — ONDANSETRON HCL 4 MG/2ML IJ SOLN: 4.0000 mg | Freq: Four times a day (QID) | INTRAMUSCULAR | Status: DC | PRN

## 2020-07-30 MED ORDER — SODIUM CHLORIDE 0.9 % IV SOLN
1.0000 g | INTRAVENOUS | Status: DC
  Administered 2020-07-30 – 2020-08-02 (×4): 1 g via INTRAVENOUS
  Filled 2020-07-30 (×5): qty 10

## 2020-07-30 MED ORDER — ONDANSETRON HCL 4 MG PO TABS: 4.0000 mg | ORAL_TABLET | Freq: Four times a day (QID) | ORAL | Status: DC | PRN

## 2020-07-30 NOTE — ED Notes (Signed)
Pt placed on bed pan due to feeling as if she needs to have a BM, pt taken off bed pan and cleaned up pt did urinate in the bed and all linens, new chux, and new purewick placed. Pt asks this RN and NT to help to reposition pt when pt was asked how she would do this at home pt states he can do it on her own, so pt was assisted with helping to be repositioned.

## 2020-07-30 NOTE — TOC Progression Note (Signed)
Transition of Care [facility]) - Progression Note    Patient Details  Name: Terri Perez MRN: [id number] Date of Birth: 31-Oct-1945  Transition of Care [facility]) CM/SW [place], RN Phone Number: 07/30/2020, 3:20 PM  Clinical Narrative:  TOC assessment for SNF  referral. Spoke with Husband, informed him that the ED visit will not qualify patient for SNF placement, because she has not been admitted for the three days per Insurance requirements. I informed husband that I can arrange home health services to include social work , who can arrange LTC placement. Husband says the Doctor is planning to admit her so she can be placed, so he will discuss with him.         Expected Discharge Plan and Services                                                 Social Determinants of Health (SDOH) Interventions    Readmission Risk Interventions No flowsheet data found.

## 2020-07-30 NOTE — Evaluation (Signed)
Physical Therapy Evaluation Patient Details Name: Terri Perez MRN: [id number] DOB: 12/15/1945 Today's Date: 07/30/2020   History of Present Illness  presented to ER secondary to progressive weakness, husband unable to care for at home; seeking transition to STR/LTC  Clinical Impression  Patient resting in bed upon arrival to room; husband present at bedside, very supportive and encouraging to patient.  Patient generally sleepy throughout evaluation, but able to maintain alertness for participation with session.   Oriented to all basic information; follows commands and participates with tasks, though requires mod encouragement at times.  Endorses generalized soreness/pain in bilat hips, meds requested per RN.  Generally weak and deconditioned throughout all extremities, LEs > UEs, though no focal weakness appreciated.  Currently requiring mod/max assist for bed mobility; min/close sup for unsupported sitting balance edge of bed.  Limited ability to move outside immediate BOS; patient very fearful of falling. Did endorse onset of dizziness, "feel like I'm going to pass out" with transition to sitting; significant drop in SBP noted with orthostatic assessment (see vitals flowsheet for details).  Additional mobility deferred at this time as result; will continue to assess/progress in subsequent sessions as medically appropriate.  RN/MD informed/aware. Would benefit from skilled PT to address above deficits and promote optimal return to PLOF.; recommend transition to STR upon discharge from acute hospitalization.  May benefit from transition to LTC at completion of rehab stay if husband unable to maintain level of care in home at that time.     Follow Up Recommendations SNF    Equipment Recommendations       Recommendations for Other Services       Precautions / Restrictions Precautions Precautions: Fall Restrictions Weight Bearing Restrictions: No      Mobility  Bed Mobility Overal  bed mobility: Needs Assistance Bed Mobility: Supine to Sit;Sit to Supine     Supine to sit: Max assist Sit to supine: Max assist   General bed mobility comments: limited dissociation of trunk/extremities; very guarded; limited active participation with treatment session    Transfers                 General transfer comment: standing deferred secondary to symptomatic orthostasis  Ambulation/Gait             General Gait Details: standing deferred secondary to symptomatic orthostasis  Stairs            Wheelchair Mobility    Modified Rankin (Stroke Patients Only)       Balance Overall balance assessment: Needs assistance Sitting-balance support: No upper extremity supported;Feet supported Sitting balance-Leahy Scale: Fair                                       Pertinent Vitals/Pain Pain Assessment: Faces Faces Pain Scale: Hurts even more Pain Location: bilat hips Pain Descriptors / Indicators: Aching;Grimacing;Guarding Pain Intervention(s): Limited activity within patient's tolerance;Monitored during session;Repositioned    Home Living Family/patient expects to be discharged to:: Private residence Living Arrangements: Spouse/significant other Available Help at Discharge: Available 24 hours/day Type of Home: House Home Access: Ramped entrance     Home Layout: One level Home Equipment: Environmental consultant - 2 wheels;Bedside commode;Shower seat;Wheelchair - manual;Hand held shower head;Hospital bed      Prior Function Level of Independence: Needs assistance   Gait / Transfers Assistance Needed: Pt ambulates with RW and husband providing wheelchair follow secondary to multiple falls at  homes. Use of wheelchair to get pt out of the home and to car.  Progressive difficulty with transfers and bed mobility; assist from husband for all mobility at this time.  Endorses approx 10 falls in previous year           Hand Dominance   Dominant Hand:  Right    Extremity/Trunk Assessment   Upper Extremity Assessment Upper Extremity Assessment: Generalized weakness (grossly at least 4-/5 throughout; mild tremor R > L UE.)    Lower Extremity Assessment Lower Extremity Assessment: Generalized weakness (grossly 2+ to 3-/5 throughout; R LE rests in position of IR (previous orthopedic injury/repair))       Communication   Communication:  (generally flat, monotone)  Cognition Arousal/Alertness: Awake/alert Behavior During Therapy: WFL for tasks assessed/performed;Flat affect Overall Cognitive Status: Within Functional Limits for tasks assessed                                 General Comments: generally flat affect, discouraged with performance/ability; mod encouragement for participation with session at times      General Comments      Exercises Other Exercises Other Exercises: Reviewed role of PT and progressive mobility, discussed orthostasis and functional implications; patient/husband voiced understanding. Other Exercises: Orthostatics assessed-see vitals flowsheet for details.  Significant SBP drop with transition to sitting; unable to tolerate transition to standing at this time.  Subjectively symptomatic with sitting this date.  RN/MD informed/aware.   Assessment/Plan    PT Assessment Patient needs continued PT services  PT Problem List Decreased strength;Decreased range of motion;Decreased activity tolerance;Decreased balance;Decreased mobility;Decreased coordination;Decreased knowledge of use of DME;Decreased safety awareness;Decreased knowledge of precautions;Pain       PT Treatment Interventions DME instruction;Gait training;Functional mobility training;Therapeutic activities;Therapeutic exercise;Balance training;Cognitive remediation;Neuromuscular re-education;Patient/family education    PT Goals (Current goals can be found in the Care Plan section)  Acute Rehab PT Goals Patient Stated Goal: to get some  additional help PT Goal Formulation: With patient/family Time For Goal Achievement: 08/13/20 Potential to Achieve Goals: Fair    Frequency Min 2X/week   Barriers to discharge Decreased caregiver support      Co-evaluation               AM-PAC PT "6 Clicks" Mobility  Outcome Measure Help needed turning from your back to your side while in a flat bed without using bedrails?: A Lot Help needed moving from lying on your back to sitting on the side of a flat bed without using bedrails?: A Lot Help needed moving to and from a bed to a chair (including a wheelchair)?: A Lot Help needed standing up from a chair using your arms (e.g., wheelchair or bedside chair)?: A Lot Help needed to walk in hospital room?: Total Help needed climbing 3-5 steps with a railing? : Total 6 Click Score: 10    End of Session Equipment Utilized During Treatment: Gait belt Activity Tolerance: Treatment limited secondary to medical complications (Comment) (symptomatic orthostasis with transition to upright) Patient left: in bed;with family/visitor present;with call bell/phone within reach Nurse Communication: Mobility status (response to upright transiton) PT Visit Diagnosis: History of falling (Z91.81);Muscle weakness (generalized) (M62.81);Difficulty in walking, not elsewhere classified (R26.2)    Time: 8338-2505 PT Time Calculation (min) (ACUTE ONLY): 28 min   Charges:   PT Evaluation $PT Eval Moderate Complexity: 1 Mod PT Treatments $Therapeutic Activity: 8-22 mins       Mory Herrman H. Owens Shark,  PT, DPT, NCS 07/30/20, 10:36 AM [phone]

## 2020-07-30 NOTE — ED Notes (Signed)
Pt requested this RN check the placement of the purewicxk, pt sates she cannot open her legs on her own but then asks to use bedside toilet to have a BM, pt educated that is not an option at this time due to safety concerns.

## 2020-07-30 NOTE — ED Notes (Signed)
Pt sleeping  Pure wick in place.

## 2020-07-30 NOTE — TOC Progression Note (Addendum)
Transition of Care [facility]) - Progression Note    Patient Details  Name: Terri Perez MRN: [id number] Date of Birth: [birth year]  Transition of Care [facility]) CM/SW Contact  Anselm Pancoast, RN Phone Number: 07/30/2020, 3:40 PM  Clinical Narrative:    MD spoke with husband regarding patient not meeting inpatient criteria and difficulty with finding SNF due to lack of 3 day inpatient stay with Medicare. TOC will attempt to find SNF willing to accept 3 day waiver for SNF placement.   PASSR completed [id number].  Spouse is requesting [facility] as patient was previously there-per [employer] 3 day waivers permitted at [facility].         Expected Discharge Plan and Services                                                 Social Determinants of Health (SDOH) Interventions    Readmission Risk Interventions No flowsheet data found.

## 2020-07-30 NOTE — TOC Initial Note (Signed)
Transition of Care [facility]) - Initial/Assessment Note    Patient Details  Name: Terri Perez MRN: [id number] Date of Birth: November 24, 1945  Transition of Care [facility]) CM/SW Contact:    Kerin Salen, RN Phone Number: 07/30/2020, 3:43 PM  Clinical Narrative:  Spoke with Husband, who wants LTC for patient, refuse home health at this time, prefer [facility] for LTC. FL2 submitted.                Expected Discharge Plan: [place] (Per Husband request.) Barriers to Discharge: Continued Medical Work up   Patient Goals and CMS Choice        Expected Discharge Plan and Services Expected Discharge Plan: [place] (Per Husband request.) In-house Referral: Clinical Social Work     Living arrangements for the past 2 months: Single Family Home                                      Prior Living Arrangements/Services Living arrangements for the past 2 months: Single Family Home Lives with:: Spouse          Need for Family Participation in Patient Care: Yes (Comment) Care giver support system in place?: Yes (comment)   Criminal Activity/Legal Involvement Pertinent to Current Situation/Hospitalization: No - Comment as needed  Activities of Daily Living      Permission Sought/Granted                  Emotional Assessment   Attitude/Demeanor/Rapport: Unable to Assess Affect (typically observed): Unable to Assess   Alcohol / Substance Use: Not Applicable Psych Involvement: No (comment)  Admission diagnosis:  Weakness Patient Active Problem List   Diagnosis Date Noted   B12 deficiency anemia 04/19/2020   Elevated troponin    Intractable vomiting with nausea    Hypokalemia 04/13/2020   Dehydration 04/13/2020   Syncope and collapse 75/64/3329   Acute metabolic encephalopathy 51/88/4166   AKI (acute kidney injury) ([place]) 04/13/2020   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   Orthostatic hypotension dysautonomic syndrome  04/21/2017   Fracture of sacrum ([place]) 08/04/2016   Adiposity 05/05/2016   Parkinson disease ([place]) 04/25/2016   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   Bilateral lower extremity edema 07/23/2015   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   Chronic kidney disease (CKD), stage III (moderate) ([place]) 05/11/2015   Elevated rheumatoid factor 05/11/2015   Primary osteoarthritis of both hips 05/11/2015   Syncope 04/24/2015   History of operative procedure on hip 04/24/2015   Restless leg 04/24/2015   Osteoporosis 03/30/2015   Insomnia 03/30/2015   Skin cancer 03/30/2015   MDD (major depressive disorder) 03/30/2015   Migraine 03/30/2015   GERD (gastroesophageal reflux disease) 03/30/2015   IBS (irritable bowel syndrome) 03/30/2015   OA (osteoarthritis) 03/30/2015   Skin lesion of chest wall 07/17/2014   Closed fracture of shaft of femur ([place]) 04/01/2014   Fracture of bone adjacent to prosthesis 04/01/2014   Breast cancer ([place]) 03/07/2013   Malignant neoplasm of upper-outer quadrant of female breast ([place])    Acute anxiety 06/11/2008   CONSTIPATION, CHRONIC 06/11/2008   Fibromyalgia 06/11/2008   PCP:  Jerrol Banana., MD Pharmacy:   [facility] [id number] - [address], [place] [address] Phone: [phone] Fax: [phone]     Social Determinants of Health (SDOH) Interventions  Readmission Risk Interventions No flowsheet data found.   

## 2020-07-30 NOTE — ED Provider Notes (Signed)
Emergency Medicine Observation Re-evaluation Note  Terri Perez is a 75 y.o. female, seen on rounds today.  Pt initially presented to the ED for complaints of Weakness and Care Coordination Currently, the patient is resting comfortably without any complaints.  Physical Exam  BP 120/66   Pulse 71   Temp 98.7 F (37.1 C) (Oral)   Resp 16   SpO2 94%  Physical Exam Gen: No acute distress  Resp: Normal rise and fall of chest Neuro: Moving all four extremities Psych: Resting currently, calm and cooperative when awake    ED Course / MDM  EKG:   I have reviewed the labs performed to date as well as medications administered while in observation.  Recent changes in the last 24 hours include no acute events overnight.  Plan  Current plan is for social work disposition, placement.    Maclane Holloran, Delice Bison, DO 07/30/20 [phone]

## 2020-07-30 NOTE — ED Notes (Signed)
Pt resting quietly and comfortably at this time. NAD noted. Normal rise and fall of chest, pt appears ion NAD at this time.

## 2020-07-30 NOTE — ED Provider Notes (Signed)
Patient found to be quite orthostatic when working with nurse.  Seems like this is progressively worsening despite any improvements with medication changes.  Discussed with patient's husband does not sound like discharge back home is a safe option for her and social work is having trouble finding appropriate placement.  Have given IV hydration.  Will discuss with hospitalist for additional IV fluids and medication management.   Merlyn Lot, MD 07/30/20 1538

## 2020-07-30 NOTE — H&P (Signed)
History and Physical    Terri Perez WYO:[id number] DOB: 12-Dec-1945 DOA: 07/29/2020  PCP: Jerrol Banana., MD   Patient coming from: Home I have personally briefly reviewed patient's old medical records in [place]  Chief Complaint: Weakness  HPI: Terri Perez is a 75 y.o. female with medical history significant for Parkinson's disease, history of rheumatoid arthritis, fibromyalgia who was brought into the ER by her husband for evaluation of weakness and difficulty with ambulation due to dizziness when she gets up from a supine position. She usually ambulates with a rolling walker but has become increasingly weak and is only able to walk a couple of steps.  She denies having any falls or any syncopal episode. She denies having any fever or chills, no cough, no headache, no blurred vision, no shortness of breath, no urinary frequency, no dysuria, no nocturia, no hematuria, no vomiting, no constipation, no diarrhea, no focal deficits, no palpitations or diarrhea. Labs show sodium 139, potassium 3.0, chloride 101, bicarb 30, glucose 103, BUN 17, creatinine 0.81, calcium 10.2, white count 7.5, hemoglobin 13.3, hematocrit 39.8, MCV 95.2, RDW 13.5, platelet count 218 Respiratory viral panel is negative Patient has pyuria Is negative twelve-lead EKG reviewed by me shows normal sinus rhythm with right axis deviation.   ED Course: Patient is a 75 year old female with a history of Parkinson's disease who was brought into the ER by her husband for evaluation of generalized weakness, dizziness with ambulation and increased risk for falls. Labs show pyuria and mild hypokalemia. She will be referred to observation status for further evaluation.    Review of Systems: As per HPI otherwise all other systems reviewed and negative.    Past Medical History:  Diagnosis Date   Arm fracture    right forearm, d/t fall   Bowel trouble 2010   Breast cancer ([place]) 12/2009   LEFT  LUMPECTOMY   Cancer ([place]) 12/2009   Left UOQ breast tumor; wide excision,sn bx and whole breast radiation. Chemotherapy done. There was only a 61m area of residual tumor remaining. All sn were negative. This was ER-positive, PR-negative, HER-2 neu not over expressed tumor. The original ultrasound size was 2.6 cm(T2).   H/O cystitis 2011   Malignant neoplasm of upper-outer quadrant of female breast ([place] January 13, 2010   2+ centimeter tumor, neoadjuvant chemotherapy. On wide excision 3 mm area of residual tumor. Sentinel node negative. ER-30%, PR-negative, HER-2 not overexpressing.   Parkinson disease [place] 04/2016   Dr SManuella Ghaziis pt s DR   Personal history of chemotherapy 2012   BREAST CA   Personal history of fibromyalgia 2002   Personal history of malignant neoplasm of large intestine    Personal history of radiation therapy 2012   BREAST CA   Special screening for malignant neoplasms, colon    Syncope    a. 03/2020 Echo: EF 50-55%, Gr1 DD, nl RV fxn, triv MR.   Unspecified constipation     Past Surgical History:  Procedure Laterality Date   ABDOMINAL HYSTERECTOMY  1974   BACK SURGERY  1985   bone spurs between 5&6, used bone from left hip   BREAST BIOPSY Left 2011   POS   BREAST CYST ASPIRATION Left 1985   BREAST LUMPECTOMY Left 2011   BREAST SURGERY Left 2011   wide excision   CERVICAL DISCECTOMY     [facility]   COLONOSCOPY  2010   Dr. WAllen Norris normal  FEMUR FRACTURE SURGERY Right March 2016   JOINT REPLACEMENT Bilateral Jan 2016 and March 2016   hip   PORT-A-CATH REMOVAL  2013   PORTACATH PLACEMENT  2011   SKIN CANCER EXCISION  1980   face   TUBAL LIGATION  1973   UPPER GI ENDOSCOPY  08/20/07   gastritis without hemorrhage     reports that she has never smoked. She has never used smokeless tobacco. She reports that she does not drink alcohol and does not use drugs.  Allergies  Allergen Reactions   Alendronate Nausea Only    Gi symptoms    Gluten Meal Nausea Only    Other reaction(s): NAUSEA Other reaction(s): NAUSEA Other reaction(s): NAUSEA   Lactose Intolerance (Gi) Diarrhea   Nsaids     GI upset. Other reaction(s): Other (See Comments) Pt unsure   Other Other (See Comments)    Other reaction(s): OTHER   Oxycodone-Acetaminophen    Oxycodone-Acetaminophen Other (See Comments)    Other reaction(s): NAUSEA   Vicodin [Hydrocodone-Acetaminophen] Nausea Only, Diarrhea and Nausea And Vomiting   Wheat Bran Other (See Comments)   Sulfa Antibiotics Rash    Rash from steri strips   Tape Rash    Rash from steri strips    Family History  Problem Relation Age of Onset   Cancer Brother        colon   Cancer Other        breast and ovarian cancers, relationships not listed   Stroke Mother    Osteoporosis Mother    Depression Sister    Depression Sister    Depression Sister    Cancer Other 81       niece   Breast cancer Neg Hx       Prior to Admission medications   Medication Sig Start Date End Date Taking? Authorizing Provider  ALPRAZolam Duanne Moron) 0.5 MG tablet Take 1 tablet (0.5 mg total) by mouth 3 (three) times daily as needed. 04/21/20  Yes Sharen Hones, MD  aspirin EC 81 MG EC tablet Take 1 tablet (81 mg total) by mouth daily. Swallow whole. 04/16/20  Yes Sharen Hones, MD  carbidopa-levodopa (SINEMET IR) 25-100 MG tablet Take 2 tablets by mouth 3 (three) times daily. 04/15/20  Yes Sharen Hones, MD  DULoxetine (CYMBALTA) 30 MG capsule Take 30 mg by mouth daily. 05/26/20  Yes [provider]  rOPINIRole (REQUIP) 1 MG tablet Take 4 mg by mouth at bedtime.   Yes [provider]  traMADol (ULTRAM) 50 MG tablet Take 100 mg by mouth every 12 (twelve) hours as needed for moderate pain.   Yes [provider]  vitamin B-12 (CYANOCOBALAMIN) 1000 MCG tablet Take 1 tablet (1,000 mcg total) by mouth daily. 04/15/20  Yes Sharen Hones, MD  atorvastatin (LIPITOR) 20 MG tablet Take 1 tablet (20 mg total) by  mouth daily. 04/15/20 05/15/20  Sharen Hones, MD  carvedilol (COREG) 3.125 MG tablet Take 1 tablet (3.125 mg total) by mouth 2 (two) times daily with a meal. Patient not taking: No sig reported 05/28/20   Jerrol Banana., MD  fludrocortisone (FLORINEF) 0.1 MG tablet TAKE ONE TABLET BY MOUTH DAILY 07/30/20   Jerrol Banana., MD  ondansetron (ZOFRAN-ODT) 4 MG disintegrating tablet Take 1 tablet (4 mg total) by mouth every 8 (eight) hours as needed for nausea or vomiting. 11/04/19   Jerrol Banana., MD  pantoprazole (PROTONIX) 40 MG tablet Take 1 tablet (40 mg total) by mouth daily.  04/15/20 05/15/20  Sharen Hones, MD    Physical Exam: Vitals:   07/30/20 1130 07/30/20 1200 07/30/20 1330 07/30/20 1517  BP: (!) 101/58 (!) 143/99 (!) 157/88 (!) 158/86  Pulse: 70 80 72   Resp: '18 18 18   ' Temp:      TempSrc:      SpO2: 94% 95% 94%      Vitals:   07/30/20 1130 07/30/20 1200 07/30/20 1330 07/30/20 1517  BP: (!) 101/58 (!) 143/99 (!) 157/88 (!) 158/86  Pulse: 70 80 72   Resp: '18 18 18   ' Temp:      TempSrc:      SpO2: 94% 95% 94%       Constitutional: Alert and oriented x 3 . Not in any apparent distress HEENT:      Head: Normocephalic and atraumatic.         Eyes: PERLA, EOMI, Conjunctivae are normal. Sclera is non-icteric.       Mouth/Throat: Mucous membranes are moist.       Neck: Supple with no signs of meningismus. Cardiovascular: Regular rate and rhythm. No murmurs, gallops, or rubs. 2+ symmetrical distal pulses are present . No JVD. No LE edema Respiratory: Respiratory effort normal .Lungs sounds clear bilaterally. No wheezes, crackles, or rhonchi.  Gastrointestinal: Soft, non tender, and non distended with positive bowel sounds.  Central adiposity Genitourinary: No CVA tenderness. Musculoskeletal: Nontender with normal range of motion in all extremities. No cyanosis, or erythema of extremities. Neurologic:  Face is symmetric. Moving all extremities. No gross focal  neurologic deficits .  Generalized weakness Skin: Skin is warm, dry.  No rash or ulcers Psychiatric: Flat affect   Labs on Admission: I have personally reviewed following labs and imaging studies  CBC: Recent Labs  Lab 07/29/20 1610  WBC 7.5  HGB 13.3  HCT 39.8  MCV 95.2  PLT 616   Basic Metabolic Panel: Recent Labs  Lab 07/29/20 1610  NA 139  K 3.0*  CL 101  CO2 30  GLUCOSE 103*  BUN 17  CREATININE 0.81  CALCIUM 10.2   GFR: CrCl cannot be calculated (Unknown ideal weight.). Liver Function Tests: No results for input(s): AST, ALT, ALKPHOS, BILITOT, PROT, ALBUMIN in the last 168 hours. No results for input(s): LIPASE, AMYLASE in the last 168 hours. No results for input(s): AMMONIA in the last 168 hours. Coagulation Profile: No results for input(s): INR, PROTIME in the last 168 hours. Cardiac Enzymes: No results for input(s): CKTOTAL, CKMB, CKMBINDEX, TROPONINI in the last 168 hours. BNP (last 3 results) No results for input(s): PROBNP in the last 8760 hours. HbA1C: No results for input(s): HGBA1C in the last 72 hours. CBG: No results for input(s): GLUCAP in the last 168 hours. Lipid Profile: No results for input(s): CHOL, HDL, LDLCALC, TRIG, CHOLHDL, LDLDIRECT in the last 72 hours. Thyroid Function Tests: No results for input(s): TSH, T4TOTAL, FREET4, T3FREE, THYROIDAB in the last 72 hours. Anemia Panel: No results for input(s): VITAMINB12, FOLATE, FERRITIN, TIBC, IRON, RETICCTPCT in the last 72 hours. Urine analysis:    Component Value Date/Time   COLORURINE STRAW (A) 07/29/2020 1712   APPEARANCEUR HAZY (A) 07/29/2020 1712   APPEARANCEUR Clear 02/26/2014 0246   LABSPEC 1.005 07/29/2020 1712   LABSPEC 1.025 02/26/2014 0246   PHURINE 9.0 (H) 07/29/2020 1712   GLUCOSEU NEGATIVE 07/29/2020 1712   GLUCOSEU Negative 02/26/2014 0246   HGBUR NEGATIVE 07/29/2020 1712   BILIRUBINUR NEGATIVE 07/29/2020 1712   BILIRUBINUR Negative 02/26/2014 0246  KETONESUR  NEGATIVE 07/29/2020 1712   PROTEINUR NEGATIVE 07/29/2020 1712   NITRITE NEGATIVE 07/29/2020 1712   LEUKOCYTESUR MODERATE (A) 07/29/2020 1712   LEUKOCYTESUR Negative 02/26/2014 0246    Radiological Exams on Admission: No results found.   Assessment/Plan Principal Problem:   Generalized weakness Active Problems:   MDD (major depressive disorder)   GERD (gastroesophageal reflux disease)   Parkinson disease (HCC)   Orthostatic hypotension dysautonomic syndrome   Hypokalemia   UTI (urinary tract infection)      Generalized weakness/orthostatic hypotension dysautonomic syndrome Secondary to known Parkinson's disease Patient with complaints of dizziness with ambulation with orthostatic blood pressure changes Continue fludrocortisone Continue Sinemet Will request neurology consult Place patient on fall precautions PT evaluation     Hypokalemia Supplement potassium Check magnesium level    UTI Place patient empirically on Rocephin 1 g IV daily to complete a 3-day course of therapy Follow-up results of urine culture     Anxiety and depression Continue Xanax and Cymbalta   DVT prophylaxis: Lovenox  Code Status: full code  Family Communication: Called and left patient's husband a voicemail to discuss her current condition and treatment plan.  Awaiting callback. Disposition Plan: Skilled nursing facility Consults called: Physical therapy Status: Observation    Shelvy Perazzo MD [facility]     07/30/2020, 4:30 PM

## 2020-07-30 NOTE — ED Notes (Signed)
Pt ate crackers and milk.  Pure wick in place

## 2020-07-30 NOTE — NC FL2 (Signed)
[place] LEVEL OF CARE SCREENING TOOL     IDENTIFICATION  Patient Name: Terri Perez Birthdate: [birth year] Sex: female Admission Date (Current Location): 07/29/2020  [facility] and [place] Number:  Engineering geologist and Address:  [facility], [address] [place], [place] [address]      Provider Number: [id number]  Attending Physician Name and Address:  No att. 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   Fracture of sacrum ([place]) 08/04/2016   Adiposity 05/05/2016   Parkinson disease ([place]) 04/25/2016   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   Bilateral lower extremity edema 07/23/2015   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   Chronic kidney disease (CKD), stage III (moderate) ([place]) 05/11/2015   Elevated rheumatoid factor 05/11/2015   Primary osteoarthritis of both hips 05/11/2015   Syncope 04/24/2015   History of operative procedure on hip 04/24/2015   Restless leg 04/24/2015   Osteoporosis 03/30/2015   Insomnia 03/30/2015   Skin cancer 03/30/2015   MDD (major depressive disorder) 03/30/2015   Migraine 03/30/2015   GERD (gastroesophageal reflux disease) 03/30/2015   IBS (irritable bowel syndrome) 03/30/2015   OA (osteoarthritis) 03/30/2015   Skin lesion of chest wall 07/17/2014   Closed  fracture of shaft of femur ([place]) 04/01/2014   Fracture of bone adjacent to prosthesis 04/01/2014   Breast cancer ([place]) 03/07/2013   Malignant neoplasm of upper-outer quadrant of female breast ([place])    Acute anxiety 06/11/2008   CONSTIPATION, CHRONIC 06/11/2008   Fibromyalgia 06/11/2008    Orientation RESPIRATION BLADDER Height & Weight     Self, Time, Situation, Place  Normal Continent Weight:   Height:     BEHAVIORAL SYMPTOMS/MOOD NEUROLOGICAL BOWEL NUTRITION STATUS      Continent Diet (Regular)  AMBULATORY STATUS COMMUNICATION OF NEEDS Skin   Limited Assist Verbally Normal                       Personal Care Assistance Level of Assistance  Bathing, Dressing Bathing Assistance: Limited assistance   Dressing Assistance: Limited assistance     Functional Limitations Info             SPECIAL CARE FACTORS FREQUENCY  PT (By licensed PT), OT (By licensed OT)     PT Frequency: min 5xweek OT Frequency: min 5xweek            Contractures      Additional Factors Info                  Current Medications (07/30/2020):  This is the current hospital active medication list Current Facility-Administered Medications  Medication Dose Route Frequency Provider Last Rate Last Admin   ALPRAZolam Duanne Moron) tablet 0.5 mg  0.5 mg Oral TID PRN Blake Divine, MD   0.5 mg at 07/30/20 1515   aspirin  EC tablet 81 mg  81 mg Oral Daily Blake Divine, MD   81 mg at 07/30/20 7035   atorvastatin (LIPITOR) tablet 20 mg  20 mg Oral Daily Blake Divine, MD   20 mg at 07/30/20 0093   carbidopa-levodopa (SINEMET IR) 25-100 MG per tablet immediate release 2 tablet  2 tablet Oral TID Blake Divine, MD   2 tablet at 07/30/20 1515   carvedilol (COREG) tablet 3.125 mg  3.125 mg Oral BID WC Blake Divine, MD   3.125 mg at 07/30/20 8182   DULoxetine (CYMBALTA) DR capsule 30 mg  30 mg Oral Daily Blake Divine, MD   30 mg at 07/30/20 9937   fludrocortisone (FLORINEF) tablet 0.1 mg  0.1  mg Oral Daily Blake Divine, MD   0.1 mg at 07/30/20 0943   ondansetron (ZOFRAN-ODT) disintegrating tablet 4 mg  4 mg Oral Q8H PRN Blake Divine, MD       pantoprazole (PROTONIX) EC tablet 40 mg  40 mg Oral Daily Blake Divine, MD   40 mg at 07/30/20 0941   rOPINIRole (REQUIP) tablet 4 mg  4 mg Oral QHS Blake Divine, MD   4 mg at 07/29/20 2304   vitamin B-12 (CYANOCOBALAMIN) tablet 1,000 mcg  1,000 mcg Oral Daily Blake Divine, MD   1,000 mcg at 07/30/20 1696   Current Outpatient Medications  Medication Sig Dispense Refill   ALPRAZolam (XANAX) 0.5 MG tablet Take 1 tablet (0.5 mg total) by mouth 3 (three) times daily as needed. 12 tablet 0   aspirin EC 81 MG EC tablet Take 1 tablet (81 mg total) by mouth daily. Swallow whole. 30 tablet 0   carbidopa-levodopa (SINEMET IR) 25-100 MG tablet Take 2 tablets by mouth 3 (three) times daily.     DULoxetine (CYMBALTA) 30 MG capsule Take 30 mg by mouth daily.     rOPINIRole (REQUIP) 1 MG tablet Take 4 mg by mouth at bedtime.     traMADol (ULTRAM) 50 MG tablet Take 100 mg by mouth every 12 (twelve) hours as needed for moderate pain.     vitamin B-12 (CYANOCOBALAMIN) 1000 MCG tablet Take 1 tablet (1,000 mcg total) by mouth daily. 30 tablet 0   atorvastatin (LIPITOR) 20 MG tablet Take 1 tablet (20 mg total) by mouth daily. 30 tablet 0   carvedilol (COREG) 3.125 MG tablet Take 1 tablet (3.125 mg total) by mouth 2 (two) times daily with a meal. (Patient not taking: No sig reported) 30 tablet 0   fludrocortisone (FLORINEF) 0.1 MG tablet TAKE ONE TABLET BY MOUTH DAILY 30 tablet 12   ondansetron (ZOFRAN-ODT) 4 MG disintegrating tablet Take 1 tablet (4 mg total) by mouth every 8 (eight) hours as needed for nausea or vomiting. 60 tablet 2   pantoprazole (PROTONIX) 40 MG tablet Take 1 tablet (40 mg total) by mouth daily. 30 tablet 0     Discharge Medications: Please see discharge summary for a list of discharge medications.  Relevant Imaging  Results:  Relevant Lab Results:   Additional Information [id number]  Anselm Pancoast, RN

## 2020-07-30 NOTE — ED Notes (Signed)
Pt requests a toothbrush and toothpaste, pt set up and assisted with both.

## 2020-07-31 DIAGNOSIS — G8929 Other chronic pain: Secondary | ICD-10-CM | POA: Diagnosis present

## 2020-07-31 DIAGNOSIS — R1312 Dysphagia, oropharyngeal phase: Secondary | ICD-10-CM | POA: Diagnosis not present

## 2020-07-31 DIAGNOSIS — E538 Deficiency of other specified B group vitamins: Secondary | ICD-10-CM | POA: Diagnosis not present

## 2020-07-31 DIAGNOSIS — E876 Hypokalemia: Secondary | ICD-10-CM | POA: Diagnosis present

## 2020-07-31 DIAGNOSIS — Z886 Allergy status to analgesic agent status: Secondary | ICD-10-CM | POA: Diagnosis not present

## 2020-07-31 DIAGNOSIS — M2550 Pain in unspecified joint: Secondary | ICD-10-CM | POA: Diagnosis not present

## 2020-07-31 DIAGNOSIS — N183 Chronic kidney disease, stage 3 unspecified: Secondary | ICD-10-CM | POA: Diagnosis present

## 2020-07-31 DIAGNOSIS — Z888 Allergy status to other drugs, medicaments and biological substances status: Secondary | ICD-10-CM | POA: Diagnosis not present

## 2020-07-31 DIAGNOSIS — Z9181 History of falling: Secondary | ICD-10-CM | POA: Diagnosis not present

## 2020-07-31 DIAGNOSIS — R252 Cramp and spasm: Secondary | ICD-10-CM | POA: Diagnosis not present

## 2020-07-31 DIAGNOSIS — G903 Multi-system degeneration of the autonomic nervous system: Secondary | ICD-10-CM | POA: Diagnosis not present

## 2020-07-31 DIAGNOSIS — I10 Essential (primary) hypertension: Secondary | ICD-10-CM | POA: Diagnosis not present

## 2020-07-31 DIAGNOSIS — E669 Obesity, unspecified: Secondary | ICD-10-CM | POA: Diagnosis not present

## 2020-07-31 DIAGNOSIS — K589 Irritable bowel syndrome without diarrhea: Secondary | ICD-10-CM | POA: Diagnosis not present

## 2020-07-31 DIAGNOSIS — Z91011 Allergy to milk products: Secondary | ICD-10-CM | POA: Diagnosis not present

## 2020-07-31 DIAGNOSIS — G909 Disorder of the autonomic nervous system, unspecified: Secondary | ICD-10-CM | POA: Diagnosis present

## 2020-07-31 DIAGNOSIS — M797 Fibromyalgia: Secondary | ICD-10-CM | POA: Diagnosis present

## 2020-07-31 DIAGNOSIS — M25551 Pain in right hip: Secondary | ICD-10-CM | POA: Diagnosis present

## 2020-07-31 DIAGNOSIS — K219 Gastro-esophageal reflux disease without esophagitis: Secondary | ICD-10-CM | POA: Diagnosis present

## 2020-07-31 DIAGNOSIS — I951 Orthostatic hypotension: Secondary | ICD-10-CM | POA: Diagnosis present

## 2020-07-31 DIAGNOSIS — M069 Rheumatoid arthritis, unspecified: Secondary | ICD-10-CM | POA: Diagnosis present

## 2020-07-31 DIAGNOSIS — M160 Bilateral primary osteoarthritis of hip: Secondary | ICD-10-CM | POA: Diagnosis not present

## 2020-07-31 DIAGNOSIS — R42 Dizziness and giddiness: Secondary | ICD-10-CM | POA: Diagnosis present

## 2020-07-31 DIAGNOSIS — Z23 Encounter for immunization: Secondary | ICD-10-CM | POA: Diagnosis not present

## 2020-07-31 DIAGNOSIS — Z7401 Bed confinement status: Secondary | ICD-10-CM | POA: Diagnosis not present

## 2020-07-31 DIAGNOSIS — Z885 Allergy status to narcotic agent status: Secondary | ICD-10-CM | POA: Diagnosis not present

## 2020-07-31 DIAGNOSIS — M810 Age-related osteoporosis without current pathological fracture: Secondary | ICD-10-CM | POA: Diagnosis not present

## 2020-07-31 DIAGNOSIS — Z7982 Long term (current) use of aspirin: Secondary | ICD-10-CM | POA: Diagnosis not present

## 2020-07-31 DIAGNOSIS — N390 Urinary tract infection, site not specified: Secondary | ICD-10-CM | POA: Diagnosis present

## 2020-07-31 DIAGNOSIS — M4606 Spinal enthesopathy, lumbar region: Secondary | ICD-10-CM | POA: Diagnosis not present

## 2020-07-31 DIAGNOSIS — G2581 Restless legs syndrome: Secondary | ICD-10-CM | POA: Diagnosis not present

## 2020-07-31 DIAGNOSIS — G20 Parkinson's disease: Secondary | ICD-10-CM | POA: Diagnosis present

## 2020-07-31 DIAGNOSIS — R531 Weakness: Secondary | ICD-10-CM | POA: Diagnosis present

## 2020-07-31 DIAGNOSIS — Z853 Personal history of malignant neoplasm of breast: Secondary | ICD-10-CM | POA: Diagnosis not present

## 2020-07-31 DIAGNOSIS — G4752 REM sleep behavior disorder: Secondary | ICD-10-CM | POA: Diagnosis not present

## 2020-07-31 DIAGNOSIS — F329 Major depressive disorder, single episode, unspecified: Secondary | ICD-10-CM | POA: Diagnosis present

## 2020-07-31 DIAGNOSIS — F419 Anxiety disorder, unspecified: Secondary | ICD-10-CM | POA: Diagnosis present

## 2020-07-31 DIAGNOSIS — R488 Other symbolic dysfunctions: Secondary | ICD-10-CM | POA: Diagnosis not present

## 2020-07-31 DIAGNOSIS — Z882 Allergy status to sulfonamides status: Secondary | ICD-10-CM | POA: Diagnosis not present

## 2020-07-31 DIAGNOSIS — Z20822 Contact with and (suspected) exposure to covid-19: Secondary | ICD-10-CM | POA: Diagnosis present

## 2020-07-31 DIAGNOSIS — R0902 Hypoxemia: Secondary | ICD-10-CM | POA: Diagnosis not present

## 2020-07-31 DIAGNOSIS — Z91018 Allergy to other foods: Secondary | ICD-10-CM | POA: Diagnosis not present

## 2020-07-31 DIAGNOSIS — N1832 Chronic kidney disease, stage 3b: Secondary | ICD-10-CM | POA: Diagnosis not present

## 2020-07-31 LAB — CBC
HCT: 36.9 % (ref 36.0–46.0)
Hemoglobin: 12.4 g/dL (ref 12.0–15.0)
MCH: 32.5 pg (ref 26.0–34.0)
MCHC: 33.6 g/dL (ref 30.0–36.0)
MCV: 96.9 fL (ref 80.0–100.0)
Platelets: 191 10*3/uL (ref 150–400)
RBC: 3.81 MIL/uL — ABNORMAL LOW (ref 3.87–5.11)
RDW: 13.5 % (ref 11.5–15.5)
WBC: 10.5 10*3/uL (ref 4.0–10.5)
nRBC: 0 % (ref 0.0–0.2)

## 2020-07-31 LAB — BASIC METABOLIC PANEL
Anion gap: 7 (ref 5–15)
BUN: 17 mg/dL (ref 8–23)
CO2: 30 mmol/L (ref 22–32)
Calcium: 9 mg/dL (ref 8.9–10.3)
Chloride: 101 mmol/L (ref 98–111)
Creatinine, Ser: 0.85 mg/dL (ref 0.44–1.00)
GFR, Estimated: >60 mL/min (ref >60)
Glucose, Bld: 115 mg/dL — ABNORMAL HIGH (ref 70–99)
Potassium: 3 mmol/L — ABNORMAL LOW (ref 3.5–5.1)
Sodium: 138 mmol/L (ref 135–145)

## 2020-07-31 LAB — GLUCOSE, CAPILLARY: Glucose-Capillary: 118 mg/dL — ABNORMAL HIGH (ref 70–99)

## 2020-07-31 MED ORDER — POTASSIUM CHLORIDE CRYS ER 20 MEQ PO TBCR
40.0000 meq | EXTENDED_RELEASE_TABLET | Freq: Two times a day (BID) | ORAL | Status: AC
  Administered 2020-07-31 (×2): 40 meq via ORAL
  Filled 2020-07-31 (×2): qty 2

## 2020-07-31 MED ORDER — MAGNESIUM SULFATE 2 GM/50ML IV SOLN
2.0000 g | Freq: Once | INTRAVENOUS | Status: AC
  Administered 2020-07-31: 15:00:00 2 g via INTRAVENOUS
  Filled 2020-07-31: qty 50

## 2020-07-31 NOTE — TOC Progression Note (Signed)
Transition of Care [facility]) - Progression Note    Patient Details  Name: JAVANA SCHEY MRN: [id number] Date of Birth: Dec 13, 1945  Transition of [facility]) CM/SW Contact  Anselm Pancoast, RN Phone Number: 07/31/2020, 8:47 AM  Clinical Narrative:    Damaris Schooner to Magda Paganini @ [facility] can accept patient if waiver not needed and [facility] has LTC bed for patient if needed.    Expected Discharge Plan: [place] (Per Husband request.) Barriers to Discharge: Continued Medical Work up  Expected Discharge Plan and Services Expected Discharge Plan: [place] (Per Husband request.) In-house Referral: Clinical Social Work     Living arrangements for the past 2 months: Single Family Home                                       Social Determinants of Health (SDOH) Interventions    Readmission Risk Interventions No flowsheet data found.

## 2020-07-31 NOTE — Progress Notes (Signed)
Entered patient room around 0110, pt was in the bathroom on the bedside commode unresponsive to voice and painful stimuli. Called for rapid response. Pt was transferred back to the bed, given O2 via bag, patient became responsive and alert. Vs Return to baseline.

## 2020-07-31 NOTE — Progress Notes (Signed)
Rapid follow up  Patients vital signs are stable. Patient alert and responsive to questions.

## 2020-07-31 NOTE — Progress Notes (Signed)
   07/31/20 0600  Clinical Encounter Type  Visited With Patient  Visit Type Initial  Referral From Nurse  Consult/Referral To Chaplain  Spiritual Encounters  Spiritual Needs Prayer;Emotional  At 1:13 am I responded to a rapid response for room 1C-127AA Pt, Terri Perez. Pt was lethargic on bedside commode staff transferred patient to bed. Pt is stable vital signs are good. No family members were present. I provided a ministry of presence and prayer.

## 2020-07-31 NOTE — Significant Event (Signed)
Rapid Response Event Note   Reason for Call :  Syncope on bedside commode   Initial Focused Assessment:  Patient lethargic on bedside commode staff transferring patient to bed. Patient vital sign back in bed  CBG118 HR 72 BP 112/62 O2 88 on RA RR 18. Patient became responsive while RT was attempting to draw ABG  Patient alert and answering questions appropriately.  MD at bed side     Interventions:  -[place] 2L applied to patient ( Vital signs 118/52 hr 74 RR 16 O2 96 ) -512ml saline bolus ordered by MD  Plan of Care:  Fall precautions  Monitor vitals  Monitor Is and Os  Event Summary:   MD Notified: Damita Dunnings MD Call Time: [place]  Gonzella Lex, RN

## 2020-07-31 NOTE — Progress Notes (Signed)
PROGRESS NOTE    Terri MIRABAL  Perez:[id number] DOB: 02-13-1945 DOA: 07/29/2020 PCP: Jerrol Banana., MD    Brief Narrative:  Terri Perez is a 75 y.o. female with medical history significant for Parkinson's disease, history of rheumatoid arthritis, fibromyalgia who was brought into the ER by her husband for evaluation of weakness and difficulty with ambulation due to dizziness when she gets up from a supine position. She usually ambulates with a rolling walker but has become increasingly weak and is only able to walk a couple of steps.  She denies having any falls or any syncopal episode.  7/8-husband at bedside.  He reports she has progressively become weak and gets dizzy while walking.  She uses a walker when she sits down as soon as she feels like she is going to fall.  He is unable to take care of her and think she would be better served in SNF. Pt is tired this am. Has no sob, cp, dizziness while in bed.    Consultants:    Procedures:   Antimicrobials:      Subjective: No abd pain, cp  Objective: Vitals:   07/30/20 1939 07/30/20 2205 07/30/20 2211 07/31/20 0330  BP: 140/72 (!) 177/89  (!) 156/81  Pulse: 77 85  79  Resp: 16 17  18   Temp:  98.2 F (36.8 C)  98 F (36.7 C)  TempSrc:  Oral  Oral  SpO2: 92% 95%  93%  Weight:   84.7 kg   Height:   5\' 9"  (1.753 m)     Intake/Output Summary (Last 24 hours) at 07/31/2020 0759 Last data filed at 07/31/2020 0200 Gross per 24 hour  Intake 360 ml  Output --  Net 360 ml   Filed Weights   07/30/20 1802 07/30/20 2211  Weight: 84.8 kg 84.7 kg    Examination:  General exam: Appears calm and comfortable , tired Respiratory system: Clear to auscultation. Respiratory effort normal. Cardiovascular system: S1 & S2 heard, RRR. No JVD, murmurs, rubs, gallops or clicks.  Gastrointestinal system: Abdomen is nondistended, soft and nontender. Normal bowel sounds heard. Central nervous system: Alert and oriented x4   Extremities: no edema Skin: warm dry Psychiatry:  Mood & affect appropriate.     Data Reviewed: I have personally reviewed following labs and imaging studies  CBC: Recent Labs  Lab 07/29/20 1610 07/31/20 0412  WBC 7.5 10.5  HGB 13.3 12.4  HCT 39.8 36.9  MCV 95.2 96.9  PLT 218 811   Basic Metabolic Panel: Recent Labs  Lab 07/29/20 1610 07/30/20 2025 07/31/20 0412  NA 139  --  138  K 3.0*  --  3.0*  CL 101  --  101  CO2 30  --  30  GLUCOSE 103*  --  115*  BUN 17  --  17  CREATININE 0.81  --  0.85  CALCIUM 10.2  --  9.0  MG  --  1.5*  --    GFR: Estimated Creatinine Clearance: 66.4 mL/min (by C-G formula based on SCr of 0.85 mg/dL). Liver Function Tests: No results for input(s): AST, ALT, ALKPHOS, BILITOT, PROT, ALBUMIN in the last 168 hours. No results for input(s): LIPASE, AMYLASE in the last 168 hours. No results for input(s): AMMONIA in the last 168 hours. Coagulation Profile: No results for input(s): INR, PROTIME in the last 168 hours. Cardiac Enzymes: No results for input(s): CKTOTAL, CKMB, CKMBINDEX, TROPONINI in the last 168 hours. BNP (last 3 results) No results  for input(s): PROBNP in the last 8760 hours. HbA1C: No results for input(s): HGBA1C in the last 72 hours. CBG: Recent Labs  Lab 07/31/20 0115  GLUCAP 118*   Lipid Profile: No results for input(s): CHOL, HDL, LDLCALC, TRIG, CHOLHDL, LDLDIRECT in the last 72 hours. Thyroid Function Tests: No results for input(s): TSH, T4TOTAL, FREET4, T3FREE, THYROIDAB in the last 72 hours. Anemia Panel: No results for input(s): VITAMINB12, FOLATE, FERRITIN, TIBC, IRON, RETICCTPCT in the last 72 hours. Sepsis Labs: No results for input(s): PROCALCITON, LATICACIDVEN in the last 168 hours.  Recent Results (from the past 240 hour(s))  Resp Panel by RT-PCR (Flu A&B, Covid) Nasopharyngeal Swab     Status: None   Collection Time: 07/29/20 11:53 PM   Specimen: Nasopharyngeal Swab; Nasopharyngeal(NP) swabs in  vial transport medium  Result Value Ref Range Status   SARS Coronavirus 2 by RT PCR NEGATIVE NEGATIVE Final    Comment: (NOTE) SARS-CoV-2 target nucleic acids are NOT DETECTED.  The SARS-CoV-2 RNA is generally detectable in upper respiratory specimens during the acute phase of infection. The lowest concentration of SARS-CoV-2 viral copies this assay can detect is 138 copies/mL. A negative result does not preclude SARS-Cov-2 infection and should not be used as the sole basis for treatment or other patient management decisions. A negative result may occur with  improper specimen collection/handling, submission of specimen other than nasopharyngeal swab, presence of viral mutation(s) within the areas targeted by this assay, and inadequate number of viral copies(<138 copies/mL). A negative result must be combined with clinical observations, patient history, and epidemiological information. The expected result is Negative.  Fact Sheet for Patients:  EntrepreneurPulse.com.au  Fact Sheet for Healthcare Providers:  IncredibleEmployment.be  This test is no t yet approved or cleared by the [place] FDA and  has been authorized for detection and/or diagnosis of SARS-CoV-2 by FDA under an Emergency Use Authorization (EUA). This EUA will remain  in effect (meaning this test can be used) for the duration of the COVID-19 declaration under Section 564(b)(1) of the Act, 21 U.S.C.section 360bbb-3(b)(1), unless the authorization is terminated  or revoked sooner.       Influenza A by PCR NEGATIVE NEGATIVE Final   Influenza B by PCR NEGATIVE NEGATIVE Final    Comment: (NOTE) The Xpert Xpress SARS-CoV-2/FLU/RSV plus assay is intended as an aid in the diagnosis of influenza from Nasopharyngeal swab specimens and should not be used as a sole basis for treatment. Nasal washings and aspirates are unacceptable for Xpert Xpress SARS-CoV-2/FLU/RSV testing.  Fact  Sheet for Patients: EntrepreneurPulse.com.au  Fact Sheet for Healthcare Providers: IncredibleEmployment.be  This test is not yet approved or cleared by the [place] FDA and has been authorized for detection and/or diagnosis of SARS-CoV-2 by FDA under an Emergency Use Authorization (EUA). This EUA will remain in effect (meaning this test can be used) for the duration of the COVID-19 declaration under Section 564(b)(1) of the Act, 21 U.S.C. section 360bbb-3(b)(1), unless the authorization is terminated or revoked.  Performed at [facility], [address], [place], [place] [address]          Radiology Studies: No results found.      Scheduled Meds:  aspirin EC  81 mg Oral Daily   atorvastatin  20 mg Oral Daily   carbidopa-levodopa  2 tablet Oral TID   carvedilol  3.125 mg Oral BID WC   DULoxetine  30 mg Oral Daily   enoxaparin (LOVENOX) injection  40 mg Subcutaneous Q24H  fludrocortisone  0.1 mg Oral Daily   pantoprazole  40 mg Oral Daily   potassium chloride  40 mEq Oral BID   rOPINIRole  4 mg Oral QHS   vitamin B-12  1,000 mcg Oral Daily   Continuous Infusions:  cefTRIAXone (ROCEPHIN)  IV Stopped (07/30/20 2008)    Assessment & Plan:   Principal Problem:   Generalized weakness Active Problems:   MDD (major depressive disorder)   GERD (gastroesophageal reflux disease)   Parkinson disease (HCC)   Orthostatic hypotension dysautonomic syndrome   Hypokalemia   UTI (urinary tract infection)   Generalized weakness/orthostatic hypotension dysautonomic syndrome: Secondary to known Parkinson's disease Patient with complaints of dizziness with ambulation with orthostatic blood pressure changes 7/8 continue Florinef  Continue Sinemet continue fludrocortisone Continue Sinemet Fall precautions PT OT Compression stockings         Hypokalemia Hypomagnesium: Will replace both. Monitor levels      UTI Continue empiric treatment with Rocephin  Follow-up urine culture            Anxiety and depression Continue Xanax and Cymbalta   DVT prophylaxis: Lovenox Code Status: Full Family Communication: Husband and granddaughter at bedside Disposition Plan:  Status is: Observation  The patient remains OBS appropriate and will d/c before 2 midnights.  Dispo: The patient is from: Home              Anticipated d/c is to:  TBD              Patient currently is not medically stable to d/c.   Difficult to place patient No            LOS: 0 days   Time spent: 35 min with >50% on coc    Nolberto Hanlon, MD [facility] Pager [phone]  If 7PM-7AM, please contact night-coverage 07/31/2020, 7:59 AM

## 2020-07-31 NOTE — Progress Notes (Signed)
Occupational Therapy Evaluation Patient Details Name: Terri Perez MRN: [id number] DOB: January 29, 1945 Today's Date: 07/31/2020    History of Present Illness presented to ER secondary to progressive weakness, husband unable to care for at home; seeking transition to STR/LTC   Clinical Impression   Patient presenting with decreased I in self care, balance, functional mobility/transfer, endurance, and safety awareness. Per chart, pt lives with husband who has been providing min - mod A for mobility with RW. Husband assists pt onto shower chair and needs assist for dressing. Multiple falls within the home and per chart her husband is unable to provide this level of care for her at home. Pt is very self limiting this session and often reports, " I can't" when asked to do tasks even tasks such as feeding herself. Pt given ice cream and needed heavy encouragement to feed self which she was able to do with increased time. Pt combing her own hair and washing her face with set up. Pt requesting to return to supine throughout session and often closing eyes needing cuing for redirection. Patient will benefit from acute OT to increase overall independence in the areas of ADLs, functional mobility, and safety awareness in order to safely discharge to next venue of care.     Follow Up Recommendations  SNF;Supervision/Assistance - 24 hour    Equipment Recommendations  Other (comment) (defer to next venue of care)       Precautions / Restrictions Precautions Precautions: Fall Restrictions Weight Bearing Restrictions: No      Mobility Bed Mobility Overal bed mobility: Needs Assistance Bed Mobility: Sit to Supine       Sit to supine: Max assist   General bed mobility comments: assist for trunk and B LEs with mod multimodal cuing for tasks    Transfers                 General transfer comment: deferred for safety    Balance Overall balance assessment: Needs assistance Sitting-balance  support: No upper extremity supported;Feet supported Sitting balance-Leahy Scale: Fair                                     ADL either performed or assessed with clinical judgement   ADL Overall ADL's : Needs assistance/impaired Eating/Feeding: Supervision/ safety;Set up;Cueing for sequencing   Grooming: Wash/dry hands;Wash/dry face;Brushing hair;Set up;Supervision/safety;Sitting                                 General ADL Comments: Pt needing heavy encouragement as pt often reports "I can't" when asked to perform tasks.     Vision Patient Visual Report: No change from baseline              Pertinent Vitals/Pain Pain Assessment: Faces Faces Pain Scale: No hurt Pain Intervention(s): Limited activity within patient's tolerance;Monitored during session;Repositioned     Hand Dominance Right   Extremity/Trunk Assessment Upper Extremity Assessment Upper Extremity Assessment: Generalized weakness   Lower Extremity Assessment Lower Extremity Assessment: Generalized weakness       Communication     Cognition Arousal/Alertness: Awake/alert Behavior During Therapy: Flat affect Overall Cognitive Status: Within Functional Limits for tasks assessed  General Comments: Pt very flat  and closing eyes during session with heavy encouragement to participate in therapeutic intervention.   General Comments       Exercises     Shoulder Instructions      Home Living Family/patient expects to be discharged to:: Private residence Living Arrangements: Spouse/significant other Available Help at Discharge: Available 24 hours/day Type of Home: House Home Access: Ramped entrance     Home Layout: One level     Bathroom Shower/Tub: Walk-in shower         Home Equipment: Environmental consultant - 2 wheels;Bedside commode;Shower seat;Wheelchair - manual;Hand held shower head;Hospital bed   Additional Comments: Pt sleeps in  lift chair per chart review      Prior Functioning/Environment Level of Independence: Needs assistance  Gait / Transfers Assistance Needed: Pt reports ambulating with use of RW and assist from husband. Multiple falls in home. ADL's / Homemaking Assistance Needed: Pt bathing in shower seat with husband assisting with bathing and dressing tasks per pt report.            OT Problem List: Decreased strength;Decreased activity tolerance;Decreased safety awareness;Impaired balance (sitting and/or standing);Decreased knowledge of use of DME or AE;Decreased knowledge of precautions      OT Treatment/Interventions: Self-care/ADL training;Balance training;Therapeutic exercise;Therapeutic activities;Energy conservation;Cognitive remediation/compensation;DME and/or AE instruction;Patient/family education;Manual therapy    OT Goals(Current goals can be found in the care plan section) Acute Rehab OT Goals Patient Stated Goal: to lay back down OT Goal Formulation: With patient Time For Goal Achievement: 08/14/20 Potential to Achieve Goals: Fair  OT Frequency: Min 2X/week   Barriers to D/C: Decreased caregiver support  husband unable to provide this level of assist per chart review          AM-PAC OT "6 Clicks" Daily Activity     Outcome Measure Help from another person eating meals?: A Little Help from another person taking care of personal grooming?: A Little Help from another person toileting, which includes using toliet, bedpan, or urinal?: Total Help from another person bathing (including washing, rinsing, drying)?: A Lot Help from another person to put on and taking off regular upper body clothing?: A Little Help from another person to put on and taking off regular lower body clothing?: A Lot 6 Click Score: 14   End of Session Nurse Communication: Mobility status  Activity Tolerance: Patient limited by fatigue Patient left: in bed;with call bell/phone within reach;with bed alarm  set  OT Visit Diagnosis: Unsteadiness on feet (R26.81);Repeated falls (R29.6);Muscle weakness (generalized) (M62.81);History of falling (Z91.81)                Time: 5945-8592 OT Time Calculation (min): 24 min Charges:  OT General Charges $OT Visit: 1 Visit OT Evaluation $OT Eval Moderate Complexity: 1 Mod OT Treatments $Self Care/Home Management : 8-22 mins  Darleen Crocker, MS, OTR/L , CBIS ascom [phone]  07/31/20, 4:41 PM  07/31/2020, 4:39 PM

## 2020-07-31 NOTE — TOC Progression Note (Signed)
Transition of Care [facility]) - Progression Note    Patient Details  Name: Terri Perez MRN: [id number] Date of Birth: 1945/06/08  Transition of Care [facility]) CM/SW [place], RN Phone Number: 07/31/2020, 3:11 PM  Clinical Narrative:   RNCM spoke to patient's husband.  He states that he would like a bed at [facility] for his wife.  His current plan is for patient to go to [facility] for rehabilitation and then explore LTC placement.  Magda Paganini at [facility] is aware.      Expected Discharge Plan: [place] (Per Husband request.) Barriers to Discharge: Continued Medical Work up  Expected Discharge Plan and Services Expected Discharge Plan: [place] (Per Husband request.) In-house Referral: Clinical Social Work     Living arrangements for the past 2 months: Single Family Home                                       Social Determinants of Health (SDOH) Interventions    Readmission Risk Interventions No flowsheet data found.

## 2020-07-31 NOTE — Progress Notes (Signed)
Physical Therapy Treatment Patient Details Name: Terri Perez MRN: [id number] DOB: 06-19-1945 Today's Date: 07/31/2020    History of Present Illness presented to ER secondary to progressive weakness, husband unable to care for at home; seeking transition to STR/LTC    PT Comments    Patient supine in bed upon arrival to room; opens eyes and responds to therapist upon arrival to room.  Endorses generalized weakness this date; generally perseverative on deficits and limitations and "needing my pills" (xanax).  RN informed/aware of patient request. Vitals stable with transition to upright sitting this date (see vitals flowsheets for details).  Continues to endorse dizziness at times; does appear to be in conjunction with request for return to supine. Patient declined attempts at standing this date, but did agree to limited participation with activities in unsupported sitting with max encouragement.  Very quick to close eyes during task, repeatedly asking to return to supine and quick to offer "I can't" before attempting activities (despite physical ability to completed requested tasks); constant cuing/redirection throughout session. Will continue to progress mobility as appropriate   Follow Up Recommendations  SNF     Equipment Recommendations       Recommendations for Other Services       Precautions / Restrictions Precautions Precautions: Fall Restrictions Weight Bearing Restrictions: No    Mobility  Bed Mobility Overal bed mobility: Needs Assistance Bed Mobility: Supine to Sit     Supine to sit: Max assist     General bed mobility comments: limited active particiaption with task    Transfers                 General transfer comment: patient declined attempts  Ambulation/Gait             General Gait Details: patient declined attempts   Stairs             Wheelchair Mobility    Modified Rankin (Stroke Patients Only)       Balance  Overall balance assessment: Needs assistance Sitting-balance support: No upper extremity supported;Feet supported Sitting balance-Leahy Scale: Fair                                      Cognition Arousal/Alertness: Lethargic Behavior During Therapy: Flat affect Overall Cognitive Status: Within Functional Limits for tasks assessed                                 General Comments: Pt very flat  and closing eyes during session with heavy encouragement to participate in therapeutic intervention.      Exercises Other Exercises Other Exercises: Unsupported sitting edge of bed x15 minutes, close sup to min assist.  Lists posteriorally with fatigue.  Intermittently participated with brief LE therex and dynamic reaching tasks (alternate, bilat) to facilitate forward trunk lean/anterior weight translation. Constant encouagement for participation; constant cuing for eyes open and alertness.    General Comments        Pertinent Vitals/Pain Pain Assessment: No/denies pain    Home Living                      Prior Function            PT Goals (current goals can now be found in the care plan section) Acute Rehab PT Goals Patient Stated Goal: to  lay back down PT Goal Formulation: With patient/family Time For Goal Achievement: 08/13/20 Potential to Achieve Goals: Fair Progress towards PT goals: Progressing toward goals    Frequency    Min 2X/week      PT Plan Current plan remains appropriate    Co-evaluation              AM-PAC PT "6 Clicks" Mobility   Outcome Measure  Help needed turning from your back to your side while in a flat bed without using bedrails?: Total Help needed moving from lying on your back to sitting on the side of a flat bed without using bedrails?: Total Help needed moving to and from a bed to a chair (including a wheelchair)?: Total Help needed standing up from a chair using your arms (e.g., wheelchair or  bedside chair)?: Total Help needed to walk in hospital room?: Total Help needed climbing 3-5 steps with a railing? : Total 6 Click Score: 6    End of Session   Activity Tolerance: Patient limited by fatigue Patient left:  (seated edge of bed with OT for evaluation; to connect alarm end of session)   PT Visit Diagnosis: History of falling (Z91.81);Muscle weakness (generalized) (M62.81);Difficulty in walking, not elsewhere classified (R26.2)     Time: 1164-3539 PT Time Calculation (min) (ACUTE ONLY): 28 min  Charges:  $Therapeutic Activity: 23-37 mins                     Roni Scow H. Owens Shark, PT, DPT, NCS 07/31/20, 10:48 PM [phone]  07/31/2020, 10:40 PM

## 2020-08-01 LAB — GLUCOSE, CAPILLARY: Glucose-Capillary: 110 mg/dL — ABNORMAL HIGH (ref 70–99)

## 2020-08-01 LAB — MAGNESIUM: Magnesium: 1.8 mg/dL (ref 1.7–2.4)

## 2020-08-01 LAB — POTASSIUM: Potassium: 3 mmol/L — ABNORMAL LOW (ref 3.5–5.1)

## 2020-08-01 MED ORDER — MAGNESIUM SULFATE 2 GM/50ML IV SOLN
2.0000 g | Freq: Once | INTRAVENOUS | Status: AC
  Administered 2020-08-01: 15:00:00 2 g via INTRAVENOUS
  Filled 2020-08-01: qty 50

## 2020-08-01 MED ORDER — LIDOCAINE 5 % EX PTCH
1.0000 | MEDICATED_PATCH | CUTANEOUS | Status: DC
  Administered 2020-08-01 – 2020-08-03 (×3): 1 via TRANSDERMAL
  Filled 2020-08-01 (×3): qty 1

## 2020-08-01 MED ORDER — POTASSIUM CHLORIDE CRYS ER 20 MEQ PO TBCR
40.0000 meq | EXTENDED_RELEASE_TABLET | Freq: Three times a day (TID) | ORAL | Status: AC
  Administered 2020-08-01 (×2): 40 meq via ORAL
  Filled 2020-08-01 (×2): qty 2

## 2020-08-01 MED ORDER — TRAMADOL HCL 50 MG PO TABS
100.0000 mg | ORAL_TABLET | Freq: Two times a day (BID) | ORAL | Status: DC | PRN
  Administered 2020-08-01 – 2020-08-03 (×4): 100 mg via ORAL
  Filled 2020-08-01 (×4): qty 2

## 2020-08-01 MED ORDER — NYSTATIN 100000 UNIT/GM EX POWD
Freq: Two times a day (BID) | CUTANEOUS | Status: DC
  Filled 2020-08-01: qty 15

## 2020-08-01 NOTE — Progress Notes (Signed)
PROGRESS NOTE    NANDA BITTICK  NOI:[id number] DOB: 05/08/1945 DOA: 07/29/2020 PCP: Jerrol Banana., MD    Brief Narrative:  Terri Perez is a 75 y.o. female with medical history significant for Parkinson's disease, history of rheumatoid arthritis, fibromyalgia who was brought into the ER by her husband for evaluation of weakness and difficulty with ambulation due to dizziness when she gets up from a supine position. She usually ambulates with a rolling walker but has become increasingly weak and is only able to walk a couple of steps.  She denies having any falls or any syncopal episode.  7/8-husband at bedside.  He reports she has progressively become weak and gets dizzy while walking.  She uses a walker when she sits down as soon as she feels like she is going to fall.  He is unable to take care of her and think she would be better served in SNF. Pt is tired this am. Has no sob, cp, dizziness while in bed.  7/9 c/o right hip pain, not new. Also requesting to resume her tramadol home dose.  Consultants:    Procedures:   Antimicrobials:      Subjective: No cp, or sob. Felt lightheaded with PT .  Objective: Vitals:   07/31/20 1700 07/31/20 2151 08/01/20 0021 08/01/20 0559  BP: (!) 163/92 125/67 117/65 (!) 160/90  Pulse: 93 74 73 81  Resp:  16 18 18   Temp:  97.9 F (36.6 C) (!) 97.5 F (36.4 C) 97.7 F (36.5 C)  TempSrc:   Oral   SpO2: 95% 94% 94% 94%  Weight:      Height:        Intake/Output Summary (Last 24 hours) at 08/01/2020 0808 Last data filed at 08/01/2020 0602 Gross per 24 hour  Intake --  Output 850 ml  Net -850 ml   Filed Weights   07/30/20 1802 07/30/20 2211  Weight: 84.8 kg 84.7 kg    Examination: NAD, calm CTA no wheeze rales rhonchi's Regular S1-S2 no gallops Soft benign positive bowel sounds No edema Awake alert and oriented, grossly intact Mood and affect appropriate in current setting   Data Reviewed: I have personally  reviewed following labs and imaging studies  CBC: Recent Labs  Lab 07/29/20 1610 07/31/20 0412  WBC 7.5 10.5  HGB 13.3 12.4  HCT 39.8 36.9  MCV 95.2 96.9  PLT 218 694   Basic Metabolic Panel: Recent Labs  Lab 07/29/20 1610 07/30/20 2025 07/31/20 0412  NA 139  --  138  K 3.0*  --  3.0*  CL 101  --  101  CO2 30  --  30  GLUCOSE 103*  --  115*  BUN 17  --  17  CREATININE 0.81  --  0.85  CALCIUM 10.2  --  9.0  MG  --  1.5*  --    GFR: Estimated Creatinine Clearance: 66.4 mL/min (by C-G formula based on SCr of 0.85 mg/dL). Liver Function Tests: No results for input(s): AST, ALT, ALKPHOS, BILITOT, PROT, ALBUMIN in the last 168 hours. No results for input(s): LIPASE, AMYLASE in the last 168 hours. No results for input(s): AMMONIA in the last 168 hours. Coagulation Profile: No results for input(s): INR, PROTIME in the last 168 hours. Cardiac Enzymes: No results for input(s): CKTOTAL, CKMB, CKMBINDEX, TROPONINI in the last 168 hours. BNP (last 3 results) No results for input(s): PROBNP in the last 8760 hours. HbA1C: No results for input(s): HGBA1C in the  last 72 hours. CBG: Recent Labs  Lab 07/31/20 0115  GLUCAP 118*   Lipid Profile: No results for input(s): CHOL, HDL, LDLCALC, TRIG, CHOLHDL, LDLDIRECT in the last 72 hours. Thyroid Function Tests: No results for input(s): TSH, T4TOTAL, FREET4, T3FREE, THYROIDAB in the last 72 hours. Anemia Panel: No results for input(s): VITAMINB12, FOLATE, FERRITIN, TIBC, IRON, RETICCTPCT in the last 72 hours. Sepsis Labs: No results for input(s): PROCALCITON, LATICACIDVEN in the last 168 hours.  Recent Results (from the past 240 hour(s))  Resp Panel by RT-PCR (Flu A&B, Covid) Nasopharyngeal Swab     Status: None   Collection Time: 07/29/20 11:53 PM   Specimen: Nasopharyngeal Swab; Nasopharyngeal(NP) swabs in vial transport medium  Result Value Ref Range Status   SARS Coronavirus 2 by RT PCR NEGATIVE NEGATIVE Final    Comment:  (NOTE) SARS-CoV-2 target nucleic acids are NOT DETECTED.  The SARS-CoV-2 RNA is generally detectable in upper respiratory specimens during the acute phase of infection. The lowest concentration of SARS-CoV-2 viral copies this assay can detect is 138 copies/mL. A negative result does not preclude SARS-Cov-2 infection and should not be used as the sole basis for treatment or other patient management decisions. A negative result may occur with  improper specimen collection/handling, submission of specimen other than nasopharyngeal swab, presence of viral mutation(s) within the areas targeted by this assay, and inadequate number of viral copies(<138 copies/mL). A negative result must be combined with clinical observations, patient history, and epidemiological information. The expected result is Negative.  Fact Sheet for Patients:  EntrepreneurPulse.com.au  Fact Sheet for Healthcare Providers:  IncredibleEmployment.be  This test is no t yet approved or cleared by the [place] FDA and  has been authorized for detection and/or diagnosis of SARS-CoV-2 by FDA under an Emergency Use Authorization (EUA). This EUA will remain  in effect (meaning this test can be used) for the duration of the COVID-19 declaration under Section 564(b)(1) of the Act, 21 U.S.C.section 360bbb-3(b)(1), unless the authorization is terminated  or revoked sooner.       Influenza A by PCR NEGATIVE NEGATIVE Final   Influenza B by PCR NEGATIVE NEGATIVE Final    Comment: (NOTE) The Xpert Xpress SARS-CoV-2/FLU/RSV plus assay is intended as an aid in the diagnosis of influenza from Nasopharyngeal swab specimens and should not be used as a sole basis for treatment. Nasal washings and aspirates are unacceptable for Xpert Xpress SARS-CoV-2/FLU/RSV testing.  Fact Sheet for Patients: EntrepreneurPulse.com.au  Fact Sheet for Healthcare  Providers: IncredibleEmployment.be  This test is not yet approved or cleared by the [place] FDA and has been authorized for detection and/or diagnosis of SARS-CoV-2 by FDA under an Emergency Use Authorization (EUA). This EUA will remain in effect (meaning this test can be used) for the duration of the COVID-19 declaration under Section 564(b)(1) of the Act, 21 U.S.C. section 360bbb-3(b)(1), unless the authorization is terminated or revoked.  Performed at [facility], [address], [place], [place] [address]          Radiology Studies: No results found.      Scheduled Meds:  aspirin EC  81 mg Oral Daily   atorvastatin  20 mg Oral Daily   carbidopa-levodopa  2 tablet Oral TID   carvedilol  3.125 mg Oral BID WC   DULoxetine  30 mg Oral Daily   enoxaparin (LOVENOX) injection  40 mg Subcutaneous Q24H   fludrocortisone  0.1 mg Oral Daily   pantoprazole  40 mg Oral Daily  rOPINIRole  4 mg Oral QHS   vitamin B-12  1,000 mcg Oral Daily   Continuous Infusions:  cefTRIAXone (ROCEPHIN)  IV 1 g (07/31/20 1741)    Assessment & Plan:   Principal Problem:   Generalized weakness Active Problems:   MDD (major depressive disorder)   GERD (gastroesophageal reflux disease)   Parkinson disease (HCC)   Orthostatic hypotension dysautonomic syndrome   Hypokalemia   UTI (urinary tract infection)   Generalized weakness/orthostatic hypotension dysautonomic syndrome: Secondary to known Parkinson's disease Patient with complaints of dizziness with ambulation with orthostatic blood pressure changes 7/9 continue Florinef Continue Sinemet Fall precautions PT OT recommend to SNF Will need compression socks  Right hip pain and generalized pain Not new Chronically on tramadol at home we will resume Apply lidocaine patch to right hip   Hypokalemia Hypomagnesium: Potassium level still low at 3.  We will give potassium 40 mill he equivalents 3  times daily Likely from her loose stools that was reported by nursing Magnesium 1.8 but will replace to keep it closer to     UTI Continue empiric treatment with Rocephin  Urine culture still not showing        Anxiety and depression Continue Xanax and Cymbalta   DVT prophylaxis: Lovenox Code Status: Full Family Communication: Husband at bedside Disposition Plan:  Status is: Inpatient  The patient remains inpatient as she requires IV medications and due to her severity of illness requiring hospitalization  Dispo: The patient is from: Home              Anticipated d/c is to SNF              Patient currently is not medically stable to d/c.   Difficult to place patient No            LOS: 1 day   Time spent: 35 min with >50% on coc    Nolberto Hanlon, MD [facility] Pager [phone]  If 7PM-7AM, please contact night-coverage 08/01/2020, 8:08 AM

## 2020-08-02 LAB — POTASSIUM: Potassium: 3.2 mmol/L — ABNORMAL LOW (ref 3.5–5.1)

## 2020-08-02 MED ORDER — METOPROLOL SUCCINATE ER 25 MG PO TB24
12.5000 mg | ORAL_TABLET | Freq: Every day | ORAL | Status: DC
  Administered 2020-08-02: 21:00:00 12.5 mg via ORAL
  Filled 2020-08-02: qty 1

## 2020-08-02 MED ORDER — POTASSIUM CHLORIDE 10 MEQ/100ML IV SOLN
10.0000 meq | INTRAVENOUS | Status: AC
  Administered 2020-08-02: 11:00:00 10 meq via INTRAVENOUS
  Filled 2020-08-02: qty 100

## 2020-08-02 MED ORDER — POTASSIUM CHLORIDE 20 MEQ PO PACK
40.0000 meq | PACK | Freq: Once | ORAL | Status: AC
  Administered 2020-08-02: 40 meq via ORAL
  Filled 2020-08-02: qty 2

## 2020-08-02 MED ORDER — POTASSIUM CHLORIDE CRYS ER 20 MEQ PO TBCR
40.0000 meq | EXTENDED_RELEASE_TABLET | Freq: Once | ORAL | Status: DC
  Filled 2020-08-02: qty 2

## 2020-08-02 MED ORDER — MECLIZINE HCL 25 MG PO TABS
12.5000 mg | ORAL_TABLET | Freq: Three times a day (TID) | ORAL | Status: DC
  Administered 2020-08-02 – 2020-08-03 (×4): 12.5 mg via ORAL
  Filled 2020-08-02: qty 0.5
  Filled 2020-08-02 (×2): qty 1
  Filled 2020-08-02 (×2): qty 0.5
  Filled 2020-08-02: qty 1
  Filled 2020-08-02 (×2): qty 0.5

## 2020-08-02 NOTE — Progress Notes (Signed)
Upon entering the patients room, PIV was refused. Attempted to educate the patient regarding the IV antibiotics ordered by her provider, and the importance of completing them as ordered. Pt. Continued to refuse the PIV. Primary RN notified via secure chat.

## 2020-08-02 NOTE — Progress Notes (Signed)
Physical Therapy Treatment Patient Details Name: Terri Perez MRN: [id number] DOB: 1945-03-08 Today's Date: 08/02/2020    History of Present Illness presented to ER secondary to progressive weakness, husband unable to care for at home; seeking transition to STR/LTC    PT Comments    Pt seen for PT session as pt requesting to get back to bed with nurse reporting inability to assist pt with transfer even with +2 assist & pt reporting "it'll take 2 men to get me up". Pt received in bed, tearful reporting she's grateful PT & nurse came to assist her so quickly. Pt is ultimately able to complete sit>stand & stand pivot with CGA with RW with ongoing cuing for sequencing turning. Nurse provides assistance for elevating feet onto bed. Pt is less oriented & more hesitant to answer questions. Pt also appears more lethargic compared to earlier session. Pt's cognition appears to wax & wane & significantly affect her physical mobility. MD made aware of pt's difference in this session compared to earlier session via secure chat.    Follow Up Recommendations  SNF     Equipment Recommendations  None recommended by PT    Recommendations for Other Services       Precautions / Restrictions Precautions Precautions: Fall Restrictions Weight Bearing Restrictions: No    Mobility  Bed Mobility Overal bed mobility: Needs Assistance Bed Mobility: Supine to Sit     Supine to sit: Supervision Sit to supine: Mod assist;HOB elevated (nurse assisted pt with elevating BLE onto bed)   General bed mobility comments: extra time, use of bed rails    Transfers Overall transfer level: Needs assistance Equipment used: Rolling walker (2 wheeled) Transfers: Sit to/from [employer] Transfers Sit to Stand: Min guard;From elevated surface Stand pivot transfers: Min guard       General transfer comment: cuing for sequencing turning  Ambulation/Gait Ambulation/Gait assistance:  (deferred 2/2 nurse  reporting pt has orthostatic hypotension (checked just prior to PT arrival))               Stairs             Wheelchair Mobility    Modified Rankin (Stroke Patients Only)       Balance Overall balance assessment: Needs assistance Sitting-balance support: No upper extremity supported;Feet supported Sitting balance-Leahy Scale: Fair       Standing balance-Leahy Scale: Poor Standing balance comment: BUE support on RW                            Cognition Arousal/Alertness: Lethargic Behavior During Therapy: Flat affect Overall Cognitive Status: Difficult to assess Area of Impairment: Orientation;Attention;Memory;Safety/judgement;Awareness;Following commands;Problem solving                 Orientation Level:  (Oriented to self & hospital but states we're in [place], unable to recall correct year, unsure of reason for hospitalization) Current Attention Level: Divided;Alternating;Selective Memory: Decreased short-term memory Following Commands: Follows one step commands inconsistently;Follows one step commands with increased time Safety/Judgement: Decreased awareness of deficits;Decreased awareness of safety Awareness: Emergent;Anticipatory;Intellectual Problem Solving: Slow processing;Decreased initiation;Difficulty sequencing;Requires tactile cues;Requires verbal cues General Comments: more delayed processing, when PT asks her orientation questions pt states "don't ask me that" & delays responding to questions, labile upon PT & nurse arrival      Exercises General Exercises - Lower Extremity Long Arc Quad: AROM;Strengthening;Both;10 reps;Seated Hip Flexion/Marching: AROM;Strengthening;Both;10 reps;Seated    General Comments  Pertinent Vitals/Pain Pain Assessment: No/denies pain    Home Living                      Prior Function            PT Goals (current goals can now be found in the care plan section) Acute Rehab  PT Goals Patient Stated Goal: get better PT Goal Formulation: With patient/family Time For Goal Achievement: 08/16/20 Potential to Achieve Goals: Fair Progress towards PT goals: Progressing toward goals    Frequency    Min 2X/week      PT Plan Current plan remains appropriate    Co-evaluation              AM-PAC PT "6 Clicks" Mobility   Outcome Measure  Help needed turning from your back to your side while in a flat bed without using bedrails?: A Little Help needed moving from lying on your back to sitting on the side of a flat bed without using bedrails?: A Lot Help needed moving to and from a bed to a chair (including a wheelchair)?: A Little Help needed standing up from a chair using your arms (e.g., wheelchair or bedside chair)?: A Little Help needed to walk in hospital room?: A Little Help needed climbing 3-5 steps with a railing? : Total 6 Click Score: 15    End of Session Equipment Utilized During Treatment: Gait belt Activity Tolerance: Patient limited by lethargy;Patient tolerated treatment well Patient left: in bed;with call bell/phone within reach;with bed alarm set Nurse Communication: Mobility status PT Visit Diagnosis: History of falling (Z91.81);Muscle weakness (generalized) (M62.81);Difficulty in walking, not elsewhere classified (R26.2)     Time: 7425-9563 PT Time Calculation (min) (ACUTE ONLY): 9 min  Charges:  $Therapeutic Activity: 8-22 mins                     Lavone Nian, PT, DPT 08/02/20, 2:30 PM    Waunita Schooner 08/02/2020, 2:28 PM

## 2020-08-02 NOTE — Progress Notes (Signed)
Physical Therapy Treatment Patient Details Name: Terri Perez MRN: [id number] DOB: Sep 10, 1945 Today's Date: 08/02/2020    History of Present Illness presented to ER secondary to progressive weakness, husband unable to care for at home; seeking transition to STR/LTC    PT Comments    Pt seen for PT tx with nurse reporting she recently checked orthostatics & pt is positive for orthostatic hypotension. Pt appears to have delayed processing but is AxO x 3. Pt is able to complete bed mobility with supervision with use of hospital bed features & extra time. Pt transfers sit>stand from EOB & stand pivot bed<>chair<>BSC with RW & CGA with cuing for turning/sequencing. Pt has continent void on BSC. Gait deferred at this time 2/2 BP issues but pt performs BLE strengthening exercises with cuing for technique. Will continue to follow pt acutely to progress mobility as able.     Follow Up Recommendations  SNF     Equipment Recommendations  None recommended by PT (TBD in next venue)    Recommendations for Other Services       Precautions / Restrictions Precautions Precautions: Fall Restrictions Weight Bearing Restrictions: No    Mobility  Bed Mobility Overal bed mobility: Needs Assistance Bed Mobility: Supine to Sit     Supine to sit: Supervision     General bed mobility comments: extra time, use of bed rails    Transfers Overall transfer level: Needs assistance Equipment used: Rolling walker (2 wheeled) Transfers: Sit to/from [employer] Transfers Sit to Stand: Min guard;From elevated surface Stand pivot transfers: Min guard       General transfer comment: Pt is able to complete sit<>stand & stand pivot bed>recliner, recliner<>BSC  Ambulation/Gait Ambulation/Gait assistance:  (deferred 2/2 nurse reporting pt has orthostatic hypotension (checked just prior to PT arrival))               Stairs             Wheelchair Mobility    Modified Rankin  (Stroke Patients Only)       Balance Overall balance assessment: Needs assistance Sitting-balance support: No upper extremity supported;Feet supported Sitting balance-Leahy Scale: Fair       Standing balance-Leahy Scale: Poor Standing balance comment: BUE support on RW                            Cognition Arousal/Alertness: Awake/alert Behavior During Therapy: Flat affect Overall Cognitive Status: Within Functional Limits for tasks assessed Area of Impairment: Orientation;Attention;Memory;Safety/judgement;Awareness;Following commands;Problem solving                 Orientation Level:  (AxO x 3 (not clear on reason for hospitalization)) Current Attention Level: Divided;Alternating;Selective Memory: Decreased short-term memory Following Commands: Follows one step commands inconsistently;Follows one step commands with increased time Safety/Judgement: Decreased awareness of deficits;Decreased awareness of safety Awareness: Emergent;Anticipatory Problem Solving: Slow processing;Decreased initiation;Difficulty sequencing;Requires tactile cues;Requires verbal cues General Comments: Pt with delayed processing & awareness.      Exercises General Exercises - Lower Extremity Long Arc Quad: AROM;Strengthening;Both;10 reps;Seated Hip Flexion/Marching: AROM;Strengthening;Both;10 reps;Seated    General Comments        Pertinent Vitals/Pain Pain Assessment: No/denies pain    Home Living                      Prior Function            PT Goals (current goals can now be found in the  care plan section) Acute Rehab PT Goals Patient Stated Goal: get better PT Goal Formulation: With patient/family Time For Goal Achievement: 08/13/20 Potential to Achieve Goals: Fair Progress towards PT goals: Progressing toward goals    Frequency    Min 2X/week      PT Plan Current plan remains appropriate    Co-evaluation              AM-PAC PT "6  Clicks" Mobility   Outcome Measure  Help needed turning from your back to your side while in a flat bed without using bedrails?: A Little Help needed moving from lying on your back to sitting on the side of a flat bed without using bedrails?: A Little Help needed moving to and from a bed to a chair (including a wheelchair)?: A Little Help needed standing up from a chair using your arms (e.g., wheelchair or bedside chair)?: A Little Help needed to walk in hospital room?: A Little Help needed climbing 3-5 steps with a railing? : Total 6 Click Score: 16    End of Session Equipment Utilized During Treatment: Gait belt Activity Tolerance: Patient tolerated treatment well Patient left: in chair;with call bell/phone within reach;with chair alarm set (reviewed use of call bell with pt) Nurse Communication: Mobility status PT Visit Diagnosis: History of falling (Z91.81);Muscle weakness (generalized) (M62.81);Difficulty in walking, not elsewhere classified (R26.2)     Time: 4462-8638 PT Time Calculation (min) (ACUTE ONLY): 25 min  Charges:  $Therapeutic Activity: 23-37 mins                     Lavone Nian, PT, DPT 08/02/20, 2:22 PM    Waunita Schooner 08/02/2020, 2:18 PM

## 2020-08-02 NOTE — Progress Notes (Signed)
PROGRESS NOTE    Terri Perez  YIF:[id number] DOB: May 25, 1945 DOA: 07/29/2020 PCP: Jerrol Banana., MD    Brief Narrative:  Terri Perez is a 75 y.o. female with medical history significant for Parkinson's disease, history of rheumatoid arthritis, fibromyalgia who was brought into the ER by her husband for evaluation of weakness and difficulty with ambulation due to dizziness when she gets up from a supine position. She usually ambulates with a rolling walker but has become increasingly weak and is only able to walk a couple of steps.  She denies having any falls or any syncopal episode.  7/8-husband at bedside.  He reports she has progressively become weak and gets dizzy while walking.  She uses a walker when she sits down as soon as she feels like she is going to fall.  He is unable to take care of her and think she would be better served in SNF. Pt is tired this am. Has no sob, cp, dizziness while in bed.  7/9 c/o right hip pain, not new. Also requesting to resume her tramadol home dose. 7/10- Denies sob, cp. Does c/o moving her head gets dizzy, standing gets dizzy.   Consultants:    Procedures:   Antimicrobials:      Subjective: No n/v or diarrhea  Objective: Vitals:   08/01/20 2016 08/02/20 0022 08/02/20 0451 08/02/20 0732  BP: (!) 141/78 134/72 (!) 160/89 (!) 177/93  Pulse: 79 74 76 80  Resp: 16 17 18 20   Temp: 97.9 F (36.6 C) (!) 97.3 F (36.3 C) 97.8 F (36.6 C) 97.7 F (36.5 C)  TempSrc:  Oral Oral   SpO2: 95% 95% 95% 93%  Weight:      Height:        Intake/Output Summary (Last 24 hours) at 08/02/2020 0817 Last data filed at 08/02/2020 0454 Gross per 24 hour  Intake 0 ml  Output 1100 ml  Net -1100 ml   Filed Weights   07/30/20 1802 07/30/20 2211  Weight: 84.8 kg 84.7 kg    Examination: NAD, calm CTA no wheeze rales rhonchi's Regular S1-S2 no gallops Soft benign positive bowel sounds No edema Awake and alert, grossly intact Mood  and affect appropriate in current setting  Data Reviewed: I have personally reviewed following labs and imaging studies  CBC: Recent Labs  Lab 07/29/20 1610 07/31/20 0412  WBC 7.5 10.5  HGB 13.3 12.4  HCT 39.8 36.9  MCV 95.2 96.9  PLT 218 867   Basic Metabolic Panel: Recent Labs  Lab 07/29/20 1610 07/30/20 2025 07/31/20 0412 08/01/20 0833 08/02/20 0635  NA 139  --  138  --   --   K 3.0*  --  3.0* 3.0* 3.2*  CL 101  --  101  --   --   CO2 30  --  30  --   --   GLUCOSE 103*  --  115*  --   --   BUN 17  --  17  --   --   CREATININE 0.81  --  0.85  --   --   CALCIUM 10.2  --  9.0  --   --   MG  --  1.5*  --  1.8  --    GFR: Estimated Creatinine Clearance: 66.4 mL/min (by C-G formula based on SCr of 0.85 mg/dL). Liver Function Tests: No results for input(s): AST, ALT, ALKPHOS, BILITOT, PROT, ALBUMIN in the last 168 hours. No results for input(s): LIPASE, AMYLASE  in the last 168 hours. No results for input(s): AMMONIA in the last 168 hours. Coagulation Profile: No results for input(s): INR, PROTIME in the last 168 hours. Cardiac Enzymes: No results for input(s): CKTOTAL, CKMB, CKMBINDEX, TROPONINI in the last 168 hours. BNP (last 3 results) No results for input(s): PROBNP in the last 8760 hours. HbA1C: No results for input(s): HGBA1C in the last 72 hours. CBG: Recent Labs  Lab 07/31/20 0115 08/01/20 1617  GLUCAP 118* 110*   Lipid Profile: No results for input(s): CHOL, HDL, LDLCALC, TRIG, CHOLHDL, LDLDIRECT in the last 72 hours. Thyroid Function Tests: No results for input(s): TSH, T4TOTAL, FREET4, T3FREE, THYROIDAB in the last 72 hours. Anemia Panel: No results for input(s): VITAMINB12, FOLATE, FERRITIN, TIBC, IRON, RETICCTPCT in the last 72 hours. Sepsis Labs: No results for input(s): PROCALCITON, LATICACIDVEN in the last 168 hours.  Recent Results (from the past 240 hour(s))  Resp Panel by RT-PCR (Flu A&B, Covid) Nasopharyngeal Swab     Status: None    Collection Time: 07/29/20 11:53 PM   Specimen: Nasopharyngeal Swab; Nasopharyngeal(NP) swabs in vial transport medium  Result Value Ref Range Status   SARS Coronavirus 2 by RT PCR NEGATIVE NEGATIVE Final    Comment: (NOTE) SARS-CoV-2 target nucleic acids are NOT DETECTED.  The SARS-CoV-2 RNA is generally detectable in upper respiratory specimens during the acute phase of infection. The lowest concentration of SARS-CoV-2 viral copies this assay can detect is 138 copies/mL. A negative result does not preclude SARS-Cov-2 infection and should not be used as the sole basis for treatment or other patient management decisions. A negative result may occur with  improper specimen collection/handling, submission of specimen other than nasopharyngeal swab, presence of viral mutation(s) within the areas targeted by this assay, and inadequate number of viral copies(<138 copies/mL). A negative result must be combined with clinical observations, patient history, and epidemiological information. The expected result is Negative.  Fact Sheet for Patients:  EntrepreneurPulse.com.au  Fact Sheet for Healthcare Providers:  IncredibleEmployment.be  This test is no t yet approved or cleared by the [place] FDA and  has been authorized for detection and/or diagnosis of SARS-CoV-2 by FDA under an Emergency Use Authorization (EUA). This EUA will remain  in effect (meaning this test can be used) for the duration of the COVID-19 declaration under Section 564(b)(1) of the Act, 21 U.S.C.section 360bbb-3(b)(1), unless the authorization is terminated  or revoked sooner.       Influenza A by PCR NEGATIVE NEGATIVE Final   Influenza B by PCR NEGATIVE NEGATIVE Final    Comment: (NOTE) The Xpert Xpress SARS-CoV-2/FLU/RSV plus assay is intended as an aid in the diagnosis of influenza from Nasopharyngeal swab specimens and should not be used as a sole basis for treatment.  Nasal washings and aspirates are unacceptable for Xpert Xpress SARS-CoV-2/FLU/RSV testing.  Fact Sheet for Patients: EntrepreneurPulse.com.au  Fact Sheet for Healthcare Providers: IncredibleEmployment.be  This test is not yet approved or cleared by the [place] FDA and has been authorized for detection and/or diagnosis of SARS-CoV-2 by FDA under an Emergency Use Authorization (EUA). This EUA will remain in effect (meaning this test can be used) for the duration of the COVID-19 declaration under Section 564(b)(1) of the Act, 21 U.S.C. section 360bbb-3(b)(1), unless the authorization is terminated or revoked.  Performed at [facility], [address], [place], [place] [address]          Radiology Studies: No results found.      Scheduled Meds:  aspirin EC  81 mg Oral Daily   atorvastatin  20 mg Oral Daily   carbidopa-levodopa  2 tablet Oral TID   carvedilol  3.125 mg Oral BID WC   DULoxetine  30 mg Oral Daily   enoxaparin (LOVENOX) injection  40 mg Subcutaneous Q24H   fludrocortisone  0.1 mg Oral Daily   lidocaine  1 patch Transdermal Q24H   nystatin   Topical BID   pantoprazole  40 mg Oral Daily   rOPINIRole  4 mg Oral QHS   vitamin B-12  1,000 mcg Oral Daily   Continuous Infusions:  cefTRIAXone (ROCEPHIN)  IV 1 g (08/01/20 1718)    Assessment & Plan:   Principal Problem:   Generalized weakness Active Problems:   MDD (major depressive disorder)   GERD (gastroesophageal reflux disease)   Parkinson disease (HCC)   Orthostatic hypotension dysautonomic syndrome   Hypokalemia   UTI (urinary tract infection)   Generalized weakness/orthostatic hypotension dysautonomic syndrome: Secondary to known Parkinson's disease Patient with complaints of dizziness with ambulation with orthostatic blood pressure changes 7/10 continue Florinef  Will change Coreg to Toprol-XL to be given at night as her blood pressure  appears to be elevated while lying down and Coreg may drop her too much.   Continue to get orthostatics  She may have a component of vertigo based on her description of her dizziness when she describes it to me.  We will start her on meclizine to see if that helps with some of her symptoms  Continue Sinemet  Fall precautions  PT OT recommend SNF  Needs to wear compression socks 20 to 30 mmHg especially when standing     Right hip pain and generalized pain Not new 7/10 continue her tramadol  Continue lidocaine patch     Hypokalemia Hypomagnesium: Potassium 3.2.  Will replace monitor levels     UTI Empirically treating with Rocephin.  No urine culture appears to have been done       Anxiety and depression Continue Xanax and Cymbalta     DVT prophylaxis: Lovenox Code Status: Full Family Communication: None at bedside Disposition Plan:  Status is: Inpatient  The patient remains inpatient as she requires IV medications and due to her severity of illness requiring hospitalization  Dispo: The patient is from: Home              Anticipated d/c is to SNF              Patient currently is not medically stable to d/c.   Difficult to place patient No            LOS: 2 days   Time spent: 35 min with >50% on coc    Nolberto Hanlon, MD [facility] Pager [phone]  If 7PM-7AM, please contact night-coverage 08/02/2020, 8:17 AM

## 2020-08-03 DIAGNOSIS — I10 Essential (primary) hypertension: Secondary | ICD-10-CM | POA: Diagnosis not present

## 2020-08-03 DIAGNOSIS — F329 Major depressive disorder, single episode, unspecified: Secondary | ICD-10-CM | POA: Diagnosis not present

## 2020-08-03 DIAGNOSIS — G319 Degenerative disease of nervous system, unspecified: Secondary | ICD-10-CM | POA: Diagnosis not present

## 2020-08-03 DIAGNOSIS — N1831 Chronic kidney disease, stage 3a: Secondary | ICD-10-CM | POA: Diagnosis not present

## 2020-08-03 DIAGNOSIS — K589 Irritable bowel syndrome without diarrhea: Secondary | ICD-10-CM | POA: Diagnosis not present

## 2020-08-03 DIAGNOSIS — R252 Cramp and spasm: Secondary | ICD-10-CM | POA: Diagnosis not present

## 2020-08-03 DIAGNOSIS — M797 Fibromyalgia: Secondary | ICD-10-CM | POA: Diagnosis not present

## 2020-08-03 DIAGNOSIS — M160 Bilateral primary osteoarthritis of hip: Secondary | ICD-10-CM | POA: Diagnosis not present

## 2020-08-03 DIAGNOSIS — N1832 Chronic kidney disease, stage 3b: Secondary | ICD-10-CM | POA: Diagnosis not present

## 2020-08-03 DIAGNOSIS — Z7982 Long term (current) use of aspirin: Secondary | ICD-10-CM | POA: Diagnosis not present

## 2020-08-03 DIAGNOSIS — E785 Hyperlipidemia, unspecified: Secondary | ICD-10-CM | POA: Diagnosis not present

## 2020-08-03 DIAGNOSIS — G903 Multi-system degeneration of the autonomic nervous system: Secondary | ICD-10-CM | POA: Diagnosis not present

## 2020-08-03 DIAGNOSIS — E876 Hypokalemia: Secondary | ICD-10-CM | POA: Diagnosis not present

## 2020-08-03 DIAGNOSIS — I959 Hypotension, unspecified: Secondary | ICD-10-CM | POA: Diagnosis not present

## 2020-08-03 DIAGNOSIS — R531 Weakness: Secondary | ICD-10-CM | POA: Diagnosis not present

## 2020-08-03 DIAGNOSIS — Z23 Encounter for immunization: Secondary | ICD-10-CM | POA: Diagnosis not present

## 2020-08-03 DIAGNOSIS — F325 Major depressive disorder, single episode, in full remission: Secondary | ICD-10-CM | POA: Diagnosis not present

## 2020-08-03 DIAGNOSIS — R0902 Hypoxemia: Secondary | ICD-10-CM | POA: Diagnosis not present

## 2020-08-03 DIAGNOSIS — I129 Hypertensive chronic kidney disease with stage 1 through stage 4 chronic kidney disease, or unspecified chronic kidney disease: Secondary | ICD-10-CM | POA: Diagnosis not present

## 2020-08-03 DIAGNOSIS — Z85828 Personal history of other malignant neoplasm of skin: Secondary | ICD-10-CM | POA: Diagnosis not present

## 2020-08-03 DIAGNOSIS — R488 Other symbolic dysfunctions: Secondary | ICD-10-CM | POA: Diagnosis not present

## 2020-08-03 DIAGNOSIS — E860 Dehydration: Secondary | ICD-10-CM | POA: Diagnosis not present

## 2020-08-03 DIAGNOSIS — G2581 Restless legs syndrome: Secondary | ICD-10-CM | POA: Diagnosis not present

## 2020-08-03 DIAGNOSIS — Z20822 Contact with and (suspected) exposure to covid-19: Secondary | ICD-10-CM | POA: Diagnosis not present

## 2020-08-03 DIAGNOSIS — K219 Gastro-esophageal reflux disease without esophagitis: Secondary | ICD-10-CM | POA: Diagnosis not present

## 2020-08-03 DIAGNOSIS — M810 Age-related osteoporosis without current pathological fracture: Secondary | ICD-10-CM | POA: Diagnosis not present

## 2020-08-03 DIAGNOSIS — E669 Obesity, unspecified: Secondary | ICD-10-CM | POA: Diagnosis not present

## 2020-08-03 DIAGNOSIS — F419 Anxiety disorder, unspecified: Secondary | ICD-10-CM | POA: Diagnosis not present

## 2020-08-03 DIAGNOSIS — M069 Rheumatoid arthritis, unspecified: Secondary | ICD-10-CM | POA: Diagnosis not present

## 2020-08-03 DIAGNOSIS — H2513 Age-related nuclear cataract, bilateral: Secondary | ICD-10-CM | POA: Diagnosis not present

## 2020-08-03 DIAGNOSIS — R1312 Dysphagia, oropharyngeal phase: Secondary | ICD-10-CM | POA: Diagnosis not present

## 2020-08-03 DIAGNOSIS — M2550 Pain in unspecified joint: Secondary | ICD-10-CM | POA: Diagnosis not present

## 2020-08-03 DIAGNOSIS — G909 Disorder of the autonomic nervous system, unspecified: Secondary | ICD-10-CM | POA: Diagnosis not present

## 2020-08-03 DIAGNOSIS — G20 Parkinson's disease: Secondary | ICD-10-CM | POA: Diagnosis not present

## 2020-08-03 DIAGNOSIS — G9389 Other specified disorders of brain: Secondary | ICD-10-CM | POA: Diagnosis not present

## 2020-08-03 DIAGNOSIS — Z85038 Personal history of other malignant neoplasm of large intestine: Secondary | ICD-10-CM | POA: Diagnosis not present

## 2020-08-03 DIAGNOSIS — N390 Urinary tract infection, site not specified: Secondary | ICD-10-CM | POA: Diagnosis not present

## 2020-08-03 DIAGNOSIS — R55 Syncope and collapse: Secondary | ICD-10-CM | POA: Diagnosis not present

## 2020-08-03 DIAGNOSIS — E538 Deficiency of other specified B group vitamins: Secondary | ICD-10-CM | POA: Diagnosis not present

## 2020-08-03 DIAGNOSIS — Z96643 Presence of artificial hip joint, bilateral: Secondary | ICD-10-CM | POA: Diagnosis not present

## 2020-08-03 DIAGNOSIS — Z853 Personal history of malignant neoplasm of breast: Secondary | ICD-10-CM | POA: Diagnosis not present

## 2020-08-03 DIAGNOSIS — I517 Cardiomegaly: Secondary | ICD-10-CM | POA: Diagnosis not present

## 2020-08-03 DIAGNOSIS — Z9181 History of falling: Secondary | ICD-10-CM | POA: Diagnosis not present

## 2020-08-03 DIAGNOSIS — M4606 Spinal enthesopathy, lumbar region: Secondary | ICD-10-CM | POA: Diagnosis not present

## 2020-08-03 DIAGNOSIS — I951 Orthostatic hypotension: Secondary | ICD-10-CM | POA: Diagnosis not present

## 2020-08-03 DIAGNOSIS — G4752 REM sleep behavior disorder: Secondary | ICD-10-CM | POA: Diagnosis not present

## 2020-08-03 DIAGNOSIS — Z79899 Other long term (current) drug therapy: Secondary | ICD-10-CM | POA: Diagnosis not present

## 2020-08-03 DIAGNOSIS — Z7401 Bed confinement status: Secondary | ICD-10-CM | POA: Diagnosis not present

## 2020-08-03 LAB — CBC
HCT: 35.7 % — ABNORMAL LOW (ref 36.0–46.0)
Hemoglobin: 11.6 g/dL — ABNORMAL LOW (ref 12.0–15.0)
MCH: 31.9 pg (ref 26.0–34.0)
MCHC: 32.5 g/dL (ref 30.0–36.0)
MCV: 98.1 fL (ref 80.0–100.0)
Platelets: 188 10*3/uL (ref 150–400)
RBC: 3.64 MIL/uL — ABNORMAL LOW (ref 3.87–5.11)
RDW: 13.1 % (ref 11.5–15.5)
WBC: 6.4 10*3/uL (ref 4.0–10.5)
nRBC: 0 % (ref 0.0–0.2)

## 2020-08-03 LAB — RESP PANEL BY RT-PCR (FLU A&B, COVID) ARPGX2
Influenza A by PCR: NEGATIVE
Influenza B by PCR: NEGATIVE
SARS Coronavirus 2 by RT PCR: NEGATIVE

## 2020-08-03 LAB — POTASSIUM: Potassium: 3.3 mmol/L — ABNORMAL LOW (ref 3.5–5.1)

## 2020-08-03 MED ORDER — LIDOCAINE 5 % EX PTCH: 1.0000 | MEDICATED_PATCH | CUTANEOUS | 0 refills | Status: DC

## 2020-08-03 MED ORDER — MECLIZINE HCL 12.5 MG PO TABS: 12.5000 mg | ORAL_TABLET | Freq: Three times a day (TID) | ORAL | 0 refills | Status: DC

## 2020-08-03 MED ORDER — COVID-19 MRNA VACC (MODERNA) 50 MCG/0.25ML IM SUSP
0.2500 mL | Freq: Once | INTRAMUSCULAR | Status: AC
  Administered 2020-08-03: 0.25 mL via INTRAMUSCULAR
  Filled 2020-08-03: qty 0.25

## 2020-08-03 MED ORDER — POTASSIUM CHLORIDE 20 MEQ PO PACK
40.0000 meq | PACK | Freq: Two times a day (BID) | ORAL | Status: DC
  Administered 2020-08-03: 40 meq via ORAL
  Filled 2020-08-03: qty 2

## 2020-08-03 MED ORDER — METOPROLOL SUCCINATE ER 25 MG PO TB24: 12.5000 mg | ORAL_TABLET | Freq: Every day | ORAL | Status: DC

## 2020-08-03 MED ORDER — NYSTATIN 100000 UNIT/GM EX POWD
Freq: Two times a day (BID) | CUTANEOUS | 0 refills | Status: DC
Start: 2020-08-03 — End: 2020-08-22

## 2020-08-03 NOTE — Progress Notes (Signed)
Occupational Therapy Treatment Patient Details Name: Terri Perez MRN: [id number] DOB: 1945-10-31 Today's Date: 08/03/2020    History of present illness presented to ER secondary to progressive weakness, husband unable to care for at home; seeking transition to STR/LTC   OT comments  Ms Loyd was seen for OT treatment on this date. Pt initially seen this AM, deferred mobility citing fatigue/dizziness but agreeable to PM session - husband at bedside educated on falls prevention in setting of orthostatic hypotension, HEP, and d/c recs.   Upon arrival to room in PM pt reclined in bed with husband/daughter at bedside, pt agreeable to tx. Pt requires MOD A exit R side of bed. MAX A don B socks seated EOB. MOD A sit<>stand x2 - pt reports feeling lethargic. Pt tolerated ~5 mins sitting EOB prior to return to bed. Instructed on importance of mobility for increased functional strengthening. Pt making progress toward goals. Pt continues to benefit from skilled OT services to maximize return to PLOF and minimize risk of future falls, injury, caregiver burden, and readmission. Will continue to follow POC. Discharge recommendation remains appropriate.    Follow Up Recommendations  SNF;Supervision/Assistance - 24 hour    Equipment Recommendations  Other (comment) (TBD)    Recommendations for Other Services      Precautions / Restrictions Precautions Precautions: Fall Restrictions Weight Bearing Restrictions: No       Mobility Bed Mobility Overal bed mobility: Needs Assistance Bed Mobility: Supine to Sit;Sit to Supine     Supine to sit: Mod assist Sit to supine: Max assist   General bed mobility comments: assist for BLE mgmt    Transfers Overall transfer level: Needs assistance   Transfers: Sit to/from Stand Sit to Stand: Mod assist;From elevated surface         General transfer comment: cues for sequencing and BLE placement    Balance Overall balance assessment:  Needs assistance Sitting-balance support: No upper extremity supported;Feet supported Sitting balance-Leahy Scale: Fair     Standing balance support: Bilateral upper extremity supported Standing balance-Leahy Scale: Poor                             ADL either performed or assessed with clinical judgement   ADL Overall ADL's : Needs assistance/impaired                                       General ADL Comments: MAX A don B socks seated EOB. MOD A for ADL t/f - pt reports feeling lethargic. Tolerated ~5 mins sitting EOB.      Cognition Arousal/Alertness: Lethargic Behavior During Therapy: Flat affect Overall Cognitive Status: Impaired/Different from baseline Area of Impairment: Following commands;Safety/judgement;Problem solving                       Following Commands: Follows one step commands inconsistently;Follows one step commands with increased time Safety/Judgement: Decreased awareness of deficits;Decreased awareness of safety   Problem Solving: Slow processing;Decreased initiation;Difficulty sequencing;Requires tactile cues;Requires verbal cues          Exercises Exercises: Other exercises Other Exercises Other Exercises: Pt and family educated re: OT role, DME recs, d/c recs, HEP, orthostatic hypotension Other Exercises: LBD, toielting, sup<>sit, sit<>stand x2, sitting/standing balance/tolerance           Pertinent Vitals/ Pain       Pain  Assessment: 0-10 Pain Score: 8  Pain Location: bilat hips Pain Descriptors / Indicators: Aching;Grimacing;Guarding Pain Intervention(s): Limited activity within patient's tolerance;Repositioned;Patient requesting pain meds-RN notified         Frequency  Min 2X/week        Progress Toward Goals  OT Goals(current goals can now be found in the care plan section)     Acute Rehab OT Goals Patient Stated Goal: get better OT Goal Formulation: With patient Time For Goal  Achievement: 08/14/20 Potential to Achieve Goals: Fair  Plan         AM-PAC OT "6 Clicks" Daily Activity     Outcome Measure   Help from another person eating meals?: A Little Help from another person taking care of personal grooming?: A Little Help from another person toileting, which includes using toliet, bedpan, or urinal?: A Lot Help from another person bathing (including washing, rinsing, drying)?: A Lot Help from another person to put on and taking off regular upper body clothing?: A Little Help from another person to put on and taking off regular lower body clothing?: A Lot 6 Click Score: 15    End of Session    OT Visit Diagnosis: Unsteadiness on feet (R26.81);Repeated falls (R29.6);Muscle weakness (generalized) (M62.81);History of falling (Z91.81)   Activity Tolerance Patient limited by fatigue   Patient Left in bed;with call bell/phone within reach;with bed alarm set   Nurse Communication Patient requests pain meds        Time: [id number] ([phone]) OT Time Calculation (min): 22 min  Charges: OT General Charges $OT Visit: 1 Visit OT Treatments $Self Care/Home Management : 23-37 mins  Dessie Coma, M.S. OTR/L  08/03/20, 4:31 PM  ascom [phone]

## 2020-08-03 NOTE — Discharge Summary (Signed)
Terri Perez HKV:[id number] DOB: 1945/02/14 DOA: 07/29/2020  PCP: Jerrol Banana., MD  Admit date: 07/29/2020 Discharge date: 08/03/2020  Admitted From: Home Disposition: [facility]  Recommendations for Outpatient Follow-up:  Follow up with PCP in 1 week Please obtain BMP/CBC in one week Please follow up with primary neurologist in 1 week     Discharge Condition:Stable CODE STATUS: Full Diet recommendation: Heart Healthy  Brief/Interim Summary: Per FIE:PPIRJ Terri Perez is a 75 y.o. 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 She usually ambulates with a rolling walker but has become increasingly weak and is only able to walk a couple of steps.  She denied having any falls or any syncopal episode. She was admitted to the hospital for further management as husband was unable to take care of her at home anymore.   -Generalized weakness/orthostatic hypotension dysautonomic syndrome: Secondary to known Parkinson's disease Unfortunately her autonomic dysfunction from Parkinson's causing her orthostatic hypotension at times is difficult to treat continue Florinef Will need to wear compression stocking knee-high 20 to 30 mmHg We changed her Coreg to Toprol-XL to be given at night as her blood pressure appears to be elevated while lying down and Coreg may drop her a.m. too much  Encouraged p.o. hydration  She also had a component of vertigo and was started on meclizine.  Can increase as tolerated  Continue Sinemet  continue to get orthostatics Fall precautions PT OT recommend SNF        -Chronic Right hip pain and generalized pain Not new continue her tramadol Continue lidocaine patch         Hypokalemia Hypomagnesium: Replaced.  Likely needs to be monitored as outpatient and supplement prn Can eat a banana daily if tolerates     UTI Empirically was with Rocephin.  No urine culture appears to have been done         Anxiety and depression Continue Xanax and Cymbalta        Discharge Diagnoses:  Principal Problem:   Generalized weakness Active Problems:   MDD (major depressive disorder)   GERD (gastroesophageal reflux disease)   Parkinson disease (HCC)   Orthostatic hypotension dysautonomic syndrome   Hypokalemia   UTI (urinary tract infection)    Discharge Instructions  Discharge Instructions     Call MD for:  persistant dizziness or light-headedness   Complete by: As directed    Diet - low sodium heart healthy   Complete by: As directed    Discharge instructions   Complete by: As directed    EAT A BANANA DAILY   Increase activity slowly   Complete by: As directed       Allergies as of 08/03/2020       Reactions   Alendronate Nausea Only   Gi symptoms   Gluten Meal Nausea Only   Other reaction(s): NAUSEA Other reaction(s): NAUSEA Other reaction(s): NAUSEA   Lactose Intolerance (gi) Diarrhea   Nsaids    GI upset. Other reaction(s): Other (See Comments) Pt unsure   Other Other (See Comments)   Other reaction(s): OTHER   Oxycodone-acetaminophen    Oxycodone-acetaminophen Other (See Comments)   Other reaction(s): NAUSEA   Vicodin [hydrocodone-acetaminophen] Nausea Only, Diarrhea, Nausea And Vomiting   Wheat Bran Other (See Comments)  Sulfa Antibiotics Rash   Rash from steri strips   Tape Rash   Rash from steri strips        Medication List     STOP taking these medications    carvedilol 3.125 MG tablet Commonly known as: COREG       TAKE these medications    ALPRAZolam 0.5 MG tablet Commonly known as: XANAX Take 1 tablet (0.5 mg total) by mouth 3 (three) times daily as needed.   aspirin 81 MG EC tablet Take 1  tablet (81 mg total) by mouth daily. Swallow whole.   atorvastatin 20 MG tablet Commonly known as: Lipitor Take 1 tablet (20 mg total) by mouth daily.   carbidopa-levodopa 25-100 MG tablet Commonly known as: SINEMET IR Take 2 tablets by mouth 3 (three) times daily.   DULoxetine 30 MG capsule Commonly known as: CYMBALTA Take 30 mg by mouth daily.   fludrocortisone 0.1 MG tablet Commonly known as: FLORINEF TAKE ONE TABLET BY MOUTH DAILY   lidocaine 5 % Commonly known as: LIDODERM Place 1 patch onto the skin daily. Remove & Discard patch within 12 hours or as directed by MD Start taking on: August 04, 2020   meclizine 12.5 MG tablet Commonly known as: ANTIVERT Take 1 tablet (12.5 mg total) by mouth 3 (three) times daily.   metoprolol succinate 25 MG 24 hr tablet Commonly known as: TOPROL-XL Take 0.5 tablets (12.5 mg total) by mouth at bedtime.   nystatin powder Commonly known as: MYCOSTATIN/NYSTOP Apply topically 2 (two) times daily.   ondansetron 4 MG disintegrating tablet Commonly known as: ZOFRAN-ODT Take 1 tablet (4 mg total) by mouth every 8 (eight) hours as needed for nausea or vomiting.   pantoprazole 40 MG tablet Commonly known as: Protonix Take 1 tablet (40 mg total) by mouth daily.   rOPINIRole 1 MG tablet Commonly known as: REQUIP Take 4 mg by mouth at bedtime.   traMADol 50 MG tablet Commonly known as: ULTRAM Take 100 mg by mouth every 12 (twelve) hours as needed for moderate pain.   vitamin Terri-12 1000 MCG tablet Commonly known as: CYANOCOBALAMIN Take 1 tablet (1,000 mcg total) by mouth daily.        Follow-up Information     Jerrol Banana., MD Follow up in 1 week(s).   Specialty: Family Medicine Contact information: [address] [place] [address] [phone]                Allergies  Allergen Reactions   Alendronate Nausea Only    Gi symptoms   Gluten Meal Nausea Only    Other reaction(s): NAUSEA Other  reaction(s): NAUSEA Other reaction(s): NAUSEA   Lactose Intolerance (Gi) Diarrhea   Nsaids     GI upset. Other reaction(s): Other (See Comments) Pt unsure   Other Other (See Comments)    Other reaction(s): OTHER   Oxycodone-Acetaminophen    Oxycodone-Acetaminophen Other (See Comments)    Other reaction(s): NAUSEA   Vicodin [Hydrocodone-Acetaminophen] Nausea Only, Diarrhea and Nausea And Vomiting   Wheat Bran Other (See Comments)   Sulfa Antibiotics Rash    Rash from steri strips   Tape Rash    Rash from steri strips    Consultations:    Procedures/Studies: No results found.    Subjective: Pt has no new complaints this am. No cp or sob.+bm  Discharge Exam: Vitals:   08/03/20 0356 08/03/20 0711  BP: 123/73 (!) 156/90  Pulse: 76 72  Resp: 18 16  Temp: 97.6 F (36.4 C) 97.8 F (36.6 C)  SpO2: 95% 96%   Vitals:   08/02/20 1944 08/03/20 0012 08/03/20 0356 08/03/20 0711  BP: (!) 148/82 99/64 123/73 (!) 156/90  Pulse: 80 77 76 72  Resp: 18 18 18 16   Temp: 98.3 F (36.8 C) 98.3 F (36.8 C) 97.6 F (36.4 C) 97.8 F (36.6 C)  TempSrc: Oral Oral Oral   SpO2:  98% 95% 96%  Weight:      Height:        General: Pt is alert, awake, not in acute distress Cardiovascular: RRR, S1/S2 +, no rubs, no gallops Respiratory: CTA bilaterally, no wheezing, no rhonchi Abdominal: Soft, NT, ND, bowel sounds + Extremities: no edema    The results of significant diagnostics from this hospitalization (including imaging, microbiology, ancillary and laboratory) are listed below for reference.     Microbiology: Recent Results (from the past 240 hour(s))  Resp Panel by RT-PCR (Flu A&Terri, Covid) Nasopharyngeal Swab     Status: None   Collection Time: 07/29/20 11:53 PM   Specimen: Nasopharyngeal Swab; Nasopharyngeal(NP) swabs in vial transport medium  Result Value Ref Range Status   SARS Coronavirus 2 by RT PCR NEGATIVE NEGATIVE Final    Comment: (NOTE) SARS-CoV-2 target nucleic  acids are NOT DETECTED.  The SARS-CoV-2 RNA is generally detectable in upper respiratory specimens during the acute phase of infection. The lowest concentration of SARS-CoV-2 viral copies this assay can detect is 138 copies/mL. A negative result does not preclude SARS-Cov-2 infection and should not be used as the sole basis for treatment or other patient management decisions. A negative result may occur with  improper specimen collection/handling, submission of specimen other than nasopharyngeal swab, presence of viral mutation(s) within the areas targeted by this assay, and inadequate number of viral copies(<138 copies/mL). A negative result must be combined with clinical observations, patient history, and epidemiological information. The expected result is Negative.  Fact Sheet for Patients:  EntrepreneurPulse.com.au  Fact Sheet for Healthcare Providers:  IncredibleEmployment.be  This test is no t yet approved or cleared by the [place] FDA and  has been authorized for detection and/or diagnosis of SARS-CoV-2 by FDA under an Emergency Use Authorization (EUA). This EUA will remain  in effect (meaning this test can be used) for the duration of the COVID-19 declaration under Section 564(Terri)(1) of the Act, 21 U.S.C.section 360bbb-3(Terri)(1), unless the authorization is terminated  or revoked sooner.       Influenza A by PCR NEGATIVE NEGATIVE Final   Influenza Terri by PCR NEGATIVE NEGATIVE Final    Comment: (NOTE) The Xpert Xpress SARS-CoV-2/FLU/RSV plus assay is intended as an aid in the diagnosis of influenza from Nasopharyngeal swab specimens and should not be used as a sole basis for treatment. Nasal washings and aspirates are unacceptable for Xpert Xpress SARS-CoV-2/FLU/RSV testing.  Fact Sheet for Patients: EntrepreneurPulse.com.au  Fact Sheet for Healthcare Providers: IncredibleEmployment.be  This  test is not yet approved or cleared by the [place] FDA and has been authorized for detection and/or diagnosis of SARS-CoV-2 by FDA under an Emergency Use Authorization (EUA). This EUA will remain in effect (meaning this test can be used) for the duration of the COVID-19 declaration under Section 564(Terri)(1) of the Act, 21 U.S.C. section 360bbb-3(Terri)(1), unless the authorization is terminated or revoked.  Performed at [facility], [address] [place], [place] [address]      Labs: BNP (last 3 results) No results for input(s): BNP in the  last 8760 hours. Basic Metabolic Panel: Recent Labs  Lab 07/29/20 1610 07/30/20 2025 07/31/20 0412 08/01/20 0833 08/02/20 0635 08/03/20 0542  NA 139  --  138  --   --   --   K 3.0*  --  3.0* 3.0* 3.2* 3.3*  CL 101  --  101  --   --   --   CO2 30  --  30  --   --   --   GLUCOSE 103*  --  115*  --   --   --   BUN 17  --  17  --   --   --   CREATININE 0.81  --  0.85  --   --   --   CALCIUM 10.2  --  9.0  --   --   --   MG  --  1.5*  --  1.8  --   --    Liver Function Tests: No results for input(s): AST, ALT, ALKPHOS, BILITOT, PROT, ALBUMIN in the last 168 hours. No results for input(s): LIPASE, AMYLASE in the last 168 hours. No results for input(s): AMMONIA in the last 168 hours. CBC: Recent Labs  Lab 07/29/20 1610 07/31/20 0412 08/03/20 0542  WBC 7.5 10.5 6.4  HGB 13.3 12.4 11.6*  HCT 39.8 36.9 35.7*  MCV 95.2 96.9 98.1  PLT 218 191 188   Cardiac Enzymes: No results for input(s): CKTOTAL, CKMB, CKMBINDEX, TROPONINI in the last 168 hours. BNP: Invalid input(s): POCBNP CBG: Recent Labs  Lab 07/31/20 0115 08/01/20 1617  GLUCAP 118* 110*   D-Dimer No results for input(s): DDIMER in the last 72 hours. Hgb A1c No results for input(s): HGBA1C in the last 72 hours. Lipid Profile No results for input(s): CHOL, HDL, LDLCALC, TRIG, CHOLHDL, LDLDIRECT in the last 72 hours. Thyroid function studies No results for  input(s): TSH, T4TOTAL, T3FREE, THYROIDAB in the last 72 hours.  Invalid input(s): FREET3 Anemia work up No results for input(s): VITAMINB12, FOLATE, FERRITIN, TIBC, IRON, RETICCTPCT in the last 72 hours. Urinalysis    Component Value Date/Time   COLORURINE STRAW (A) 07/29/2020 1712   APPEARANCEUR HAZY (A) 07/29/2020 1712   APPEARANCEUR Clear 02/26/2014 0246   LABSPEC 1.005 07/29/2020 1712   LABSPEC 1.025 02/26/2014 0246   PHURINE 9.0 (H) 07/29/2020 1712   GLUCOSEU NEGATIVE 07/29/2020 1712   GLUCOSEU Negative 02/26/2014 0246   HGBUR NEGATIVE 07/29/2020 1712   BILIRUBINUR NEGATIVE 07/29/2020 1712   BILIRUBINUR Negative 02/26/2014 0246   KETONESUR NEGATIVE 07/29/2020 1712   PROTEINUR NEGATIVE 07/29/2020 1712   NITRITE NEGATIVE 07/29/2020 1712   LEUKOCYTESUR MODERATE (A) 07/29/2020 1712   LEUKOCYTESUR Negative 02/26/2014 0246   Sepsis Labs Invalid input(s): PROCALCITONIN,  WBC,  LACTICIDVEN Microbiology Recent Results (from the past 240 hour(s))  Resp Panel by RT-PCR (Flu A&Terri, Covid) Nasopharyngeal Swab     Status: None   Collection Time: 07/29/20 11:53 PM   Specimen: Nasopharyngeal Swab; Nasopharyngeal(NP) swabs in vial transport medium  Result Value Ref Range Status   SARS Coronavirus 2 by RT PCR NEGATIVE NEGATIVE Final    Comment: (NOTE) SARS-CoV-2 target nucleic acids are NOT DETECTED.  The SARS-CoV-2 RNA is generally detectable in upper respiratory specimens during the acute phase of infection. The lowest concentration of SARS-CoV-2 viral copies this assay can detect is 138 copies/mL. A negative result does not preclude SARS-Cov-2 infection and should not be used as the sole basis for treatment or other patient management decisions. A negative result may  occur with  improper specimen collection/handling, submission of specimen other than nasopharyngeal swab, presence of viral mutation(s) within the areas targeted by this assay, and inadequate number of  viral copies(<138 copies/mL). A negative result must be combined with clinical observations, patient history, and epidemiological information. The expected result is Negative.  Fact Sheet for Patients:  EntrepreneurPulse.com.au  Fact Sheet for Healthcare Providers:  IncredibleEmployment.be  This test is no t yet approved or cleared by the [place] FDA and  has been authorized for detection and/or diagnosis of SARS-CoV-2 by FDA under an Emergency Use Authorization (EUA). This EUA will remain  in effect (meaning this test can be used) for the duration of the COVID-19 declaration under Section 564(Terri)(1) of the Act, 21 U.S.C.section 360bbb-3(Terri)(1), unless the authorization is terminated  or revoked sooner.       Influenza A by PCR NEGATIVE NEGATIVE Final   Influenza Terri by PCR NEGATIVE NEGATIVE Final    Comment: (NOTE) The Xpert Xpress SARS-CoV-2/FLU/RSV plus assay is intended as an aid in the diagnosis of influenza from Nasopharyngeal swab specimens and should not be used as a sole basis for treatment. Nasal washings and aspirates are unacceptable for Xpert Xpress SARS-CoV-2/FLU/RSV testing.  Fact Sheet for Patients: EntrepreneurPulse.com.au  Fact Sheet for Healthcare Providers: IncredibleEmployment.be  This test is not yet approved or cleared by the [place] FDA and has been authorized for detection and/or diagnosis of SARS-CoV-2 by FDA under an Emergency Use Authorization (EUA). This EUA will remain in effect (meaning this test can be used) for the duration of the COVID-19 declaration under Section 564(Terri)(1) of the Act, 21 U.S.C. section 360bbb-3(Terri)(1), unless the authorization is terminated or revoked.  Performed at [facility], [address], [place], [place] [address]      Time coordinating discharge: Over 30 minutes  SIGNED:   Nolberto Hanlon, MD  [facility] 08/03/2020, 1:38 PM Pager   If 7PM-7AM, please contact night-coverage [email or web address] Password [email or web address]

## 2020-08-03 NOTE — Care Management Important Message (Signed)
Important Message  Patient Details  Name: Terri Perez MRN: [id number] Date of Birth: Mar 25, 1945   Medicare Important Message Given:  N/A - LOS <3 / Initial given by admissions     Juliann Pulse A Taaliyah Delpriore 08/03/2020, 9:03 AM

## 2020-08-03 NOTE — Plan of Care (Signed)
Pt has received pain meds for hip and leg pain. Pt refused meclizine. Pt requested IV to come out due to pain. Pt refused IV to be reinserted by IV team and pt continues to refuse IV. On call provider aware will notify dayshift nurse and leave note for attending.  Problem: Education: Goal: Knowledge of General Education information will improve Description: Including pain rating scale, medication(s)/side effects and non-pharmacologic comfort measures Outcome: Progressing   Problem: Health Behavior/Discharge Planning: Goal: Ability to manage health-related needs will improve Outcome: Progressing   Problem: Clinical Measurements: Goal: Ability to maintain clinical measurements within normal limits will improve Outcome: Progressing Goal: Will remain free from infection Outcome: Progressing Goal: Diagnostic test results will improve Outcome: Progressing Goal: Respiratory complications will improve Outcome: Progressing Goal: Cardiovascular complication will be avoided Outcome: Progressing   Problem: Activity: Goal: Risk for activity intolerance will decrease Outcome: Progressing   Problem: Nutrition: Goal: Adequate nutrition will be maintained Outcome: Progressing   Problem: Coping: Goal: Level of anxiety will decrease Outcome: Progressing   Problem: Elimination: Goal: Will not experience complications related to bowel motility Outcome: Progressing Goal: Will not experience complications related to urinary retention Outcome: Progressing   Problem: Pain Managment: Goal: General experience of comfort will improve Outcome: Progressing   Problem: Safety: Goal: Ability to remain free from injury will improve Outcome: Progressing   Problem: Skin Integrity: Goal: Risk for impaired skin integrity will decrease Outcome: Progressing

## 2020-08-03 NOTE — TOC Progression Note (Signed)
Transition of Care [facility]) - Progression Note    Patient Details  Name: Terri Perez MRN: [id number] Date of Birth: August 17, 1945  Transition [facility]) CM/SW [place], RN Phone Number: 08/03/2020, 1:56 PM  Clinical Narrative:   RNCM in with family several times today, family was undecided about disposition.  [facility] today, stating he was taking her home.  RNCM and staff discussed home health inability to provide 24/7 care for patient with insurance coverage.  Spouse contacted patient's daughter, who is assisting in the decision.    Patient has been an inpatient for 3 days and is able to transfer to [facility], family decided to transfer.  Upon discussion, patient has not had COVID booster.  Patient's husband asked if it was necessary to transfer to facility and stated he did not want her to go to facility.  Patient's daughter asked for 15 mins to discuss with family.    Family states they have spoken with patient hospitalist and have decided on [facility] and they give permission for patient to have booster, patient agreeable to plan as well.  Patient will go to [facility], per [place], after negative COVID test and booster vaccine.  Patient, family, facility and care team aware of decision, all agreeable to disposition    Expected Discharge Plan: [place] (Per Husband request.) Barriers to Discharge: Continued Medical Work up  Expected Discharge Plan and Services Expected Discharge Plan: [place] (Per Husband request.) In-house Referral: Clinical Social Work     Living arrangements for the past 2 months: Single Family Home Expected Discharge Date: 08/03/20                                     Social Determinants of Health (SDOH) Interventions    Readmission Risk Interventions No flowsheet data found.

## 2020-08-04 DIAGNOSIS — I951 Orthostatic hypotension: Secondary | ICD-10-CM | POA: Diagnosis not present

## 2020-08-04 DIAGNOSIS — G20 Parkinson's disease: Secondary | ICD-10-CM | POA: Diagnosis not present

## 2020-08-04 DIAGNOSIS — F325 Major depressive disorder, single episode, in full remission: Secondary | ICD-10-CM | POA: Diagnosis not present

## 2020-08-12 ENCOUNTER — Ambulatory Visit: Payer: Self-pay | Admitting: Family Medicine

## 2020-08-17 DIAGNOSIS — H2513 Age-related nuclear cataract, bilateral: Secondary | ICD-10-CM | POA: Diagnosis not present

## 2020-08-21 ENCOUNTER — Emergency Department: Payer: Medicare Other

## 2020-08-21 ENCOUNTER — Observation Stay
Admission: EM | Admit: 2020-08-21 | Discharge: 2020-08-22 | Disposition: A | Discharge status: Home / Self Care | Payer: Medicare Other | Attending: Internal Medicine | Admitting: Internal Medicine

## 2020-08-21 ENCOUNTER — Observation Stay: Payer: Medicare Other

## 2020-08-21 DIAGNOSIS — I517 Cardiomegaly: Secondary | ICD-10-CM | POA: Diagnosis not present

## 2020-08-21 DIAGNOSIS — Z96643 Presence of artificial hip joint, bilateral: Secondary | ICD-10-CM | POA: Diagnosis not present

## 2020-08-21 DIAGNOSIS — I129 Hypertensive chronic kidney disease with stage 1 through stage 4 chronic kidney disease, or unspecified chronic kidney disease: Secondary | ICD-10-CM | POA: Insufficient documentation

## 2020-08-21 DIAGNOSIS — G9389 Other specified disorders of brain: Secondary | ICD-10-CM | POA: Diagnosis not present

## 2020-08-21 DIAGNOSIS — G20 Parkinson's disease: Secondary | ICD-10-CM | POA: Diagnosis not present

## 2020-08-21 DIAGNOSIS — E785 Hyperlipidemia, unspecified: Secondary | ICD-10-CM | POA: Diagnosis not present

## 2020-08-21 DIAGNOSIS — Z7982 Long term (current) use of aspirin: Secondary | ICD-10-CM | POA: Insufficient documentation

## 2020-08-21 DIAGNOSIS — E876 Hypokalemia: Secondary | ICD-10-CM | POA: Insufficient documentation

## 2020-08-21 DIAGNOSIS — Z20822 Contact with and (suspected) exposure to covid-19: Secondary | ICD-10-CM | POA: Insufficient documentation

## 2020-08-21 DIAGNOSIS — N1831 Chronic kidney disease, stage 3a: Secondary | ICD-10-CM | POA: Diagnosis not present

## 2020-08-21 DIAGNOSIS — Z85038 Personal history of other malignant neoplasm of large intestine: Secondary | ICD-10-CM | POA: Diagnosis not present

## 2020-08-21 DIAGNOSIS — Z853 Personal history of malignant neoplasm of breast: Secondary | ICD-10-CM | POA: Diagnosis not present

## 2020-08-21 DIAGNOSIS — Z85828 Personal history of other malignant neoplasm of skin: Secondary | ICD-10-CM | POA: Diagnosis not present

## 2020-08-21 DIAGNOSIS — N183 Chronic kidney disease, stage 3 unspecified: Secondary | ICD-10-CM | POA: Diagnosis present

## 2020-08-21 DIAGNOSIS — Z79899 Other long term (current) drug therapy: Secondary | ICD-10-CM | POA: Insufficient documentation

## 2020-08-21 DIAGNOSIS — I951 Orthostatic hypotension: Secondary | ICD-10-CM | POA: Diagnosis present

## 2020-08-21 DIAGNOSIS — I10 Essential (primary) hypertension: Secondary | ICD-10-CM | POA: Diagnosis present

## 2020-08-21 DIAGNOSIS — K219 Gastro-esophageal reflux disease without esophagitis: Secondary | ICD-10-CM | POA: Diagnosis present

## 2020-08-21 DIAGNOSIS — G319 Degenerative disease of nervous system, unspecified: Secondary | ICD-10-CM | POA: Diagnosis not present

## 2020-08-21 DIAGNOSIS — E860 Dehydration: Secondary | ICD-10-CM | POA: Diagnosis not present

## 2020-08-21 DIAGNOSIS — R55 Syncope and collapse: Principal | ICD-10-CM | POA: Insufficient documentation

## 2020-08-21 LAB — CBC
HCT: 36.9 % (ref 36.0–46.0)
Hemoglobin: 12.1 g/dL (ref 12.0–15.0)
MCH: 32.5 pg (ref 26.0–34.0)
MCHC: 32.8 g/dL (ref 30.0–36.0)
MCV: 99.2 fL (ref 80.0–100.0)
Platelets: 225 10*3/uL (ref 150–400)
RBC: 3.72 MIL/uL — ABNORMAL LOW (ref 3.87–5.11)
RDW: 13.6 % (ref 11.5–15.5)
WBC: 7.7 10*3/uL (ref 4.0–10.5)
nRBC: 0 % (ref 0.0–0.2)

## 2020-08-21 LAB — URINALYSIS, COMPLETE (UACMP) WITH MICROSCOPIC
Bilirubin Urine: NEGATIVE
Glucose, UA: NEGATIVE mg/dL
Hgb urine dipstick: NEGATIVE
Ketones, ur: 5 mg/dL — AB
Nitrite: NEGATIVE
Protein, ur: 100 mg/dL — AB
Specific Gravity, Urine: 1.014 (ref 1.005–1.030)
pH: 7 (ref 5.0–8.0)

## 2020-08-21 LAB — BASIC METABOLIC PANEL
Anion gap: 9 (ref 5–15)
BUN: 11 mg/dL (ref 8–23)
CO2: 28 mmol/L (ref 22–32)
Calcium: 9.1 mg/dL (ref 8.9–10.3)
Chloride: 105 mmol/L (ref 98–111)
Creatinine, Ser: 1.02 mg/dL — ABNORMAL HIGH (ref 0.44–1.00)
GFR, Estimated: 57 mL/min — ABNORMAL LOW (ref >60)
Glucose, Bld: 110 mg/dL — ABNORMAL HIGH (ref 70–99)
Potassium: 3 mmol/L — ABNORMAL LOW (ref 3.5–5.1)
Sodium: 142 mmol/L (ref 135–145)

## 2020-08-21 LAB — RESP PANEL BY RT-PCR (FLU A&B, COVID) ARPGX2
Influenza A by PCR: NEGATIVE
Influenza B by PCR: NEGATIVE
SARS Coronavirus 2 by RT PCR: NEGATIVE

## 2020-08-21 LAB — MAGNESIUM: Magnesium: 0.7 mg/dL — CL (ref 1.7–2.4)

## 2020-08-21 LAB — BRAIN NATRIURETIC PEPTIDE: B Natriuretic Peptide: 193.8 pg/mL — ABNORMAL HIGH (ref 0.0–100.0)

## 2020-08-21 LAB — PHOSPHORUS: Phosphorus: <1 mg/dL — CL (ref 2.5–4.6)

## 2020-08-21 LAB — TROPONIN I (HIGH SENSITIVITY): Troponin I (High Sensitivity): 4 ng/L (ref <18)

## 2020-08-21 MED ORDER — LIDOCAINE 5 % EX PTCH
1.0000 | MEDICATED_PATCH | CUTANEOUS | Status: DC
  Filled 2020-08-21 (×2): qty 1

## 2020-08-21 MED ORDER — MAGNESIUM SULFATE 4 GM/100ML IV SOLN
4.0000 g | Freq: Once | INTRAVENOUS | Status: AC
  Administered 2020-08-21: 4 g via INTRAVENOUS
  Filled 2020-08-21: qty 100

## 2020-08-21 MED ORDER — VITAMIN B-12 1000 MCG PO TABS
1000.0000 ug | ORAL_TABLET | Freq: Every day | ORAL | Status: DC
  Administered 2020-08-22: 09:00:00 1000 ug via ORAL
  Filled 2020-08-21: qty 1

## 2020-08-21 MED ORDER — ALPRAZOLAM 0.5 MG PO TABS
0.5000 mg | ORAL_TABLET | Freq: Three times a day (TID) | ORAL | Status: DC | PRN
  Administered 2020-08-21 – 2020-08-22 (×2): 0.5 mg via ORAL
  Filled 2020-08-21 (×3): qty 1

## 2020-08-21 MED ORDER — ATORVASTATIN CALCIUM 20 MG PO TABS
20.0000 mg | ORAL_TABLET | Freq: Every day | ORAL | Status: DC
  Filled 2020-08-21: qty 1

## 2020-08-21 MED ORDER — POTASSIUM CHLORIDE 10 MEQ/100ML IV SOLN
10.0000 meq | INTRAVENOUS | Status: DC
  Administered 2020-08-21 (×2): 10 meq via INTRAVENOUS
  Filled 2020-08-21 (×2): qty 100

## 2020-08-21 MED ORDER — ENOXAPARIN SODIUM 40 MG/0.4ML IJ SOSY: 40.0000 mg | PREFILLED_SYRINGE | INTRAMUSCULAR | Status: DC

## 2020-08-21 MED ORDER — DULOXETINE HCL 30 MG PO CPEP
30.0000 mg | ORAL_CAPSULE | Freq: Every day | ORAL | Status: DC
  Filled 2020-08-21: qty 1

## 2020-08-21 MED ORDER — PANTOPRAZOLE SODIUM 40 MG PO TBEC
40.0000 mg | DELAYED_RELEASE_TABLET | Freq: Every day | ORAL | Status: DC
  Administered 2020-08-22: 09:00:00 40 mg via ORAL
  Filled 2020-08-21: qty 1

## 2020-08-21 MED ORDER — HYDRALAZINE HCL 20 MG/ML IJ SOLN: 5.0000 mg | INTRAMUSCULAR | Status: DC | PRN

## 2020-08-21 MED ORDER — ASPIRIN EC 81 MG PO TBEC: 81.0000 mg | DELAYED_RELEASE_TABLET | Freq: Four times a day (QID) | ORAL | Status: DC | PRN

## 2020-08-21 MED ORDER — MECLIZINE HCL 12.5 MG PO TABS
12.5000 mg | ORAL_TABLET | Freq: Three times a day (TID) | ORAL | Status: DC | PRN
  Filled 2020-08-21 (×2): qty 1

## 2020-08-21 MED ORDER — METOPROLOL SUCCINATE ER 25 MG PO TB24
12.5000 mg | ORAL_TABLET | Freq: Every day | ORAL | Status: DC
  Filled 2020-08-21: qty 0.5

## 2020-08-21 MED ORDER — FLUDROCORTISONE ACETATE 0.1 MG PO TABS
100.0000 ug | ORAL_TABLET | Freq: Every day | ORAL | Status: DC
  Administered 2020-08-22: 09:00:00 100 ug via ORAL
  Filled 2020-08-21: qty 1

## 2020-08-21 MED ORDER — POTASSIUM CHLORIDE CRYS ER 20 MEQ PO TBCR
40.0000 meq | EXTENDED_RELEASE_TABLET | ORAL | Status: DC
  Filled 2020-08-21: qty 2

## 2020-08-21 MED ORDER — SODIUM CHLORIDE 0.9 % IV SOLN: INTRAVENOUS | Status: DC

## 2020-08-21 MED ORDER — ROPINIROLE HCL 1 MG PO TABS: 4.0000 mg | ORAL_TABLET | Freq: Every day | ORAL | Status: DC

## 2020-08-21 MED ORDER — ONDANSETRON HCL 4 MG/2ML IJ SOLN: 4.0000 mg | Freq: Three times a day (TID) | INTRAMUSCULAR | Status: DC | PRN

## 2020-08-21 MED ORDER — ALPRAZOLAM 0.5 MG PO TABS
0.5000 mg | ORAL_TABLET | Freq: Once | ORAL | Status: AC
  Administered 2020-08-21: 0.5 mg via ORAL
  Filled 2020-08-21: qty 1

## 2020-08-21 MED ORDER — POTASSIUM PHOSPHATES 15 MMOLE/5ML IV SOLN
45.0000 mmol | Freq: Once | INTRAVENOUS | Status: AC
  Administered 2020-08-21: 45 mmol via INTRAVENOUS
  Filled 2020-08-21: qty 15

## 2020-08-21 MED ORDER — CARBIDOPA-LEVODOPA 25-250 MG PO TABS
2.0000 | ORAL_TABLET | Freq: Three times a day (TID) | ORAL | Status: DC
  Administered 2020-08-21 – 2020-08-22 (×3): 2 via ORAL
  Filled 2020-08-21 (×6): qty 2

## 2020-08-21 NOTE — ED Notes (Signed)
Pt. Repositioned, warm blankets provided. Thermostat adjusted, lights dimmed. Pt. States she is going to try and rest.

## 2020-08-21 NOTE — ED Notes (Signed)
Pt in MRI.

## 2020-08-21 NOTE — ED Provider Notes (Signed)
[facility] Emergency Department Provider Note   ____________________________________________   None    (approximate)  I have reviewed the triage vital signs and the nursing notes.   HISTORY  Chief Complaint No chief complaint on file.  Chief complaint is syncope  HPI Terri Perez is a 75 y.o. female patient reports she went to the bathroom to urinate as she sometimes has to urinate frequently remembers getting up off the toilet reaching down to get her underwear and becoming very lightheaded and sitting back down she does not remember anything after that though she cannot hear.  Patient reportedly was found by EMS lying back on the toilet spread out.  Her arms were hanging on either side of her.  She was very hard to arouse and was unconscious hard to move out of the bathroom.  She had apparently been that way for quite some time.  Patient now says she feels okay.  She has been getting lightheaded.  She is supposed to be getting her Florinef in the morning and amlodipine in the evening but she is not sure if this is happening or not.  She does not remember getting much medicine in the evening.  Patient currently reports no numbness or weakness or new symptoms.        Past Medical History:  Diagnosis Date   Arm fracture    right forearm, d/t fall   Bowel trouble 2010   Breast cancer ([place]) 12/2009   LEFT LUMPECTOMY   Cancer ([place]) 12/2009   Left UOQ breast tumor; wide excision,sn bx and whole breast radiation. Chemotherapy done. There was only a 33m area of residual tumor remaining. All sn were negative. This was ER-positive, PR-negative, HER-2 neu not over expressed tumor. The original ultrasound size was 2.6 cm(T2).   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 On wide excision 3 mm area of residual tumor. Sentinel node negative. ER-30%,  PR-negative, HER-2 not overexpressing.   Parkinson disease ([place] 04/2016   Dr SManuella Ghaziis pt s DR   Personal history of chemotherapy 2012   BREAST CA   Personal history of fibromyalgia 2002   Personal history of malignant neoplasm of large intestine    Personal history of radiation therapy 2012   BREAST CA   Special screening for malignant neoplasms, colon    Syncope    a. 03/2020 Echo: EF 50-55%, Gr1 DD, nl RV fxn, triv MR.   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   Fracture of sacrum ([place] 08/04/2016   Adiposity 05/05/2016   Parkinson disease ([place] 04/25/2016   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   Bilateral lower extremity edema 07/23/2015   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   Chronic kidney disease (CKD), stage III (moderate) ([place] 05/11/2015   Elevated rheumatoid factor 05/11/2015   Primary osteoarthritis of both hips 05/11/2015   Syncope 04/24/2015   History of operative procedure on hip 04/24/2015  Restless leg 04/24/2015   Osteoporosis 03/30/2015   Insomnia 03/30/2015   Skin cancer 03/30/2015   MDD (major depressive disorder) 03/30/2015   Migraine 03/30/2015   GERD (gastroesophageal reflux disease) 03/30/2015   IBS (irritable bowel syndrome) 03/30/2015   OA (osteoarthritis) 03/30/2015   Skin lesion of chest wall 07/17/2014   Closed fracture of shaft of femur ([place]) 04/01/2014   Fracture of bone adjacent to prosthesis 04/01/2014   Breast cancer ([place]) 03/07/2013   Malignant  neoplasm of upper-outer quadrant of female breast ([place])    Acute anxiety 06/11/2008   CONSTIPATION, CHRONIC 06/11/2008   Fibromyalgia 06/11/2008    Past Surgical History:  Procedure Laterality Date   [place]   bone spurs between 5&6, used bone from left hip   BREAST BIOPSY Left 2011   POS   BREAST CYST ASPIRATION Left 1985   BREAST LUMPECTOMY Left 2011   BREAST SURGERY Left 2011   wide excision   CERVICAL DISCECTOMY     COLON RESECTION  Sept 2014   [facility]    COLONOSCOPY  2010   Dr. Allen Norris, normal   FEMUR FRACTURE SURGERY Right March 2016   JOINT REPLACEMENT Bilateral Jan 2016 and March 2016   hip   PORT-A-CATH REMOVAL  2013   PORTACATH PLACEMENT  2011   SKIN CANCER EXCISION  1980   face   TUBAL LIGATION  1973   UPPER GI ENDOSCOPY  08/20/07   gastritis without hemorrhage    Prior to Admission medications   Medication Sig Start Date End Date Taking? Authorizing Provider  ALPRAZolam Duanne Moron) 0.5 MG tablet Take 1 tablet (0.5 mg total) by mouth 3 (three) times daily as needed. 04/21/20   Sharen Hones, MD  aspirin EC 81 MG EC tablet Take 1 tablet (81 mg total) by mouth daily. Swallow whole. 04/16/20   Sharen Hones, MD  atorvastatin (LIPITOR) 20 MG tablet Take 1 tablet (20 mg total) by mouth daily. 04/15/20 05/15/20  Sharen Hones, MD  carbidopa-levodopa (SINEMET IR) 25-100 MG tablet Take 2 tablets by mouth 3 (three) times daily. 04/15/20   Sharen Hones, MD  DULoxetine (CYMBALTA) 30 MG capsule Take 30 mg by mouth daily. 05/26/20   [provider]  fludrocortisone (FLORINEF) 0.1 MG tablet TAKE ONE TABLET BY MOUTH DAILY 07/30/20   Jerrol Banana., MD  lidocaine (LIDODERM) 5 % Place 1 patch onto the skin daily. Remove & Discard patch within 12 hours or as directed by MD 08/04/20   Nolberto Hanlon, MD  meclizine (ANTIVERT) 12.5 MG tablet Take 1 tablet (12.5 mg total) by mouth 3 (three) times daily. 08/03/20   Nolberto Hanlon, MD  metoprolol  succinate (TOPROL-XL) 25 MG 24 hr tablet Take 0.5 tablets (12.5 mg total) by mouth at bedtime. 08/03/20   Nolberto Hanlon, MD  nystatin (MYCOSTATIN/NYSTOP) powder Apply topically 2 (two) times daily. 08/03/20   Nolberto Hanlon, MD  ondansetron (ZOFRAN-ODT) 4 MG disintegrating tablet Take 1 tablet (4 mg total) by mouth every 8 (eight) hours as needed for nausea or vomiting. 11/04/19   Jerrol Banana., MD  pantoprazole (PROTONIX) 40 MG tablet Take 1 tablet (40 mg total) by mouth daily. 04/15/20 05/15/20  Sharen Hones, MD  rOPINIRole (REQUIP) 1 MG tablet Take 4 mg by mouth at bedtime.    [provider]  traMADol (ULTRAM) 50 MG tablet Take 100 mg by mouth every 12 (twelve) hours as needed for moderate pain.  [provider]  vitamin B-12 (CYANOCOBALAMIN) 1000 MCG tablet Take 1 tablet (1,000 mcg total) by mouth daily. 04/15/20   Sharen Hones, MD    Allergies Alendronate, Gluten meal, Lactose intolerance (gi), Nsaids, Other, Oxycodone-acetaminophen, Oxycodone-acetaminophen, Vicodin [hydrocodone-acetaminophen], Wheat bran, Sulfa antibiotics, and Tape  Family History  Problem Relation Age of Onset   Cancer Brother        colon   Cancer Other        breast and ovarian cancers, relationships not listed   Stroke Mother    Osteoporosis Mother    Depression Sister    Depression Sister    Depression Sister    Cancer Other 59       niece   Breast cancer Neg Hx     Social History Social History   Tobacco Use   Smoking status: Never   Smokeless tobacco: Never  Substance Use Topics   Alcohol use: No   Drug use: No    Review of Systems  Constitutional: No fever/chills Eyes: No visual changes. ENT: No sore throat. Cardiovascular: Denies chest pain. Respiratory: Denies shortness of breath. Gastrointestinal: No abdominal pain.  No nausea, no vomiting.  No diarrhea.  No constipation. Genitourinary: Negative for dysuria. Musculoskeletal: Negative for back pain. Skin:  Negative for rash. Neurological: Negative for headaches, focal weakness  ____________________________________________   PHYSICAL EXAM:  VITAL SIGNS: ED Triage Vitals [08/21/20 1012]  Enc Vitals Group     BP 121/67     Pulse Rate 81     Resp 20     Temp 98.4 F (36.9 C)     Temp Source Oral     SpO2 98 %     Weight 184 lb (83.5 kg)     Height 5' 8.75" (1.746 m)     Head Circumference      Peak Flow      Pain Score      Pain Loc      Pain Edu?      Excl. in [place]?     Constitutional: Alert and oriented.  Eyes: Conjunctivae are normal. PER Head: Atraumatic. Nose: No congestion/rhinnorhea. Mouth/Throat: Mucous membranes are moist.  Oropharynx non-erythematous. Neck: No stridor neck is not tender to palpation  cardiovascular: Normal rate, regular rhythm. Grossly normal heart sounds.  Good peripheral circulation. Respiratory: Normal respiratory effort.  No retractions. Lungs CTAB. Gastrointestinal: Soft and nontender. No distention. No abdominal bruits.  Musculoskeletal: No lower extremity tenderness trace bilateral edema.  Neurologic:  Normal speech and language. No new gross focal neurologic deficits are appreciated.  Skin:  Skin is warm, dry and intact. No rash noted.  ____________________________________________   LABS (all labs ordered are listed, but only abnormal results are displayed)  Labs Reviewed  BASIC METABOLIC PANEL - Abnormal; Notable for the following components:      Result Value   Potassium 3.0 (*)    Glucose, Bld 110 (*)    Creatinine, Ser 1.02 (*)    GFR, Estimated 57 (*)    All other components within normal limits  CBC - Abnormal; Notable for the following components:   RBC 3.72 (*)    All other components within normal limits  URINALYSIS, COMPLETE (UACMP) WITH MICROSCOPIC - Abnormal; Notable for the following components:   Color, Urine YELLOW (*)    APPearance CLOUDY (*)    Ketones, ur 5 (*)    Protein, ur 100 (*)    Leukocytes,Ua SMALL (*)     Bacteria, UA RARE (*)  All other components within normal limits  TROPONIN I (HIGH SENSITIVITY)  TROPONIN I (HIGH SENSITIVITY)   ____________________________________________  EKG  EKG #1 read interpreted by me shows normal sinus rhythm rate of 82 normal axis very irregular baseline but no acute changes except for some flattening diffusely of the T waves. EKG #2 read interpreted by me shows normal sinus rhythm rate of 86 normal axis no acute changes possibly slightly more prominent T waves than previously. ____________________________________________  RADIOLOGY Gertha Calkin, personally viewed and evaluated these images (plain radiographs) as part of my medical decision making, as well as reviewing the written report by the radiologist.  ED MD interpretation:    Official radiology report(s): CT Head Wo Contrast  Result Date: 08/21/2020 CLINICAL DATA:  Syncope EXAM: CT HEAD WITHOUT CONTRAST TECHNIQUE: Contiguous axial images were obtained from the base of the skull through the vertex without intravenous contrast. COMPARISON:  04/13/2020 FINDINGS: Brain: The brainstem, cerebellum, cerebral peduncles, thalami, basal ganglia, basilar cisterns, and ventricular system appear within normal limits. Periventricular white matter and corona radiata hypodensities favor chronic ischemic microvascular white matter disease. No intracranial hemorrhage, mass lesion, or acute CVA. Vascular: Unremarkable Skull: Unremarkable Sinuses/Orbits: Unremarkable Other: No supplemental non-categorized findings. IMPRESSION: 1. No acute intracranial findings. 2. Periventricular white matter and corona radiata hypodensities favor chronic ischemic microvascular white matter disease. Electronically Signed   By: Van Clines M.D.   On: 08/21/2020 15:22   DG Chest Portable 1 View  Result Date: 08/21/2020 CLINICAL DATA:  Syncope EXAM: PORTABLE CHEST 1 VIEW COMPARISON:  April 13, 2020. FINDINGS: Similar  enlargement the cardiac silhouette. No consolidation. No visible pleural effusions or pneumothorax on this single AP radiograph. No evidence of acute osseous abnormality. IMPRESSION: 1. No evidence of acute cardiopulmonary disease. 2. Cardiomegaly. Electronically Signed   By: Margaretha Sheffield MD   On: 08/21/2020 14:29    ____________________________________________   PROCEDURES  Procedure(s) performed (including Critical Care):  Procedures   ____________________________________________   INITIAL IMPRESSION / ASSESSMENT AND PLAN / ED COURSE   Patient was unconscious for at least half an hour.  This is a prolonged episode of syncope.  She did not seem to be confused afterwards either like she was postictal.  She is old and frail and would prefer to watch her in the hospital overnight.  I will see if we can arrange this with the hospitalist.             ____________________________________________   FINAL CLINICAL IMPRESSION(S) / ED DIAGNOSES  Final diagnoses:  Syncope and collapse     ED Discharge Orders     None        Note:  This document was prepared using Dragon voice recognition software and may include unintentional dictation errors.    Nena Polio, MD 08/21/20 [id number]

## 2020-08-21 NOTE — ED Notes (Signed)
Pt. Brief changed, linens changed, pericare completed. purewick remains in place. NAD.

## 2020-08-21 NOTE — ED Notes (Signed)
Pt. Returned from MRI, placed on cont. Cardiac monitoring, repositioned, warm blankets offered. NAD. Will continue to monitor.

## 2020-08-21 NOTE — ED Triage Notes (Addendum)
Pt in from [facility] via [facility] after syncope on toilet (not using bathroom). Presently, GCS 15 and BP 98/70. Denies any cp, sob or n/v. Recently started on new BP med per pt. CBG 101. Given 263ms NS en route

## 2020-08-21 NOTE — H&P (Signed)
History and Physical    Terri Perez EZM:[id number] DOB: 09-10-1945 DOA: 08/21/2020  Referring MD/NP/PA:   PCP: Jerrol Banana., MD   Patient coming from:  The patient is coming from SNF.       Chief Complaint: syncope  HPI: Terri Perez is a 75 y.o. female with medical history significant of Parkinson's disease, orthostatic hypotension dysautonomic syndrome, hypertension, hyperlipidemia, GERD, depression with anxiety, syncope, breast cancer (s/p of left lumpectomy, radiation and chemotherapy), CKD stage IIIa, fibromyalgia, IBS, who presents with syncope.  Per her husband (I called her husband by phone), pt passed out for more thant 30 min. Pt states that she went to the bathroom to urinate. She remembers getting up off the toilet, reaching down to get her underwear and becoming very lightheaded and sitting back down, then she does not remember anything after that. Per EMS report, she was unconscious and hard to arouse.  Patient can move all extremities.  No facial droop.  Patient talks slowly, seems to have difficulty talking.  Patient denies any chest pain, cough, shortness breath.  Patient has nausea, no vomiting, diarrhea or abdominal pain.  Denies symptoms of UTI. When I saw pt in ED, she talks slowly, but she is alert, oriented x3.  ED Course: pt was found to have WBC 7.7, troponin level 4, pending COVID-19 PCR, stable renal function, potassium 3.0, temperature normal, blood pressure 151/74, heart rate 88, RR 20, oxygen saturation 95% on room air.  Chest x-ray negative.  CT head is negative for acute intracranial abnormalities.  Patient is placed on MedSurg bed for observation  Review of Systems:   General: no fevers, chills, no body weight gain, has fatigue HEENT: no blurry vision, hearing changes or sore throat Respiratory: no dyspnea, coughing, wheezing CV: no chest pain, no palpitations GI: no nausea, vomiting, abdominal pain, diarrhea, constipation GU: no  dysuria, burning on urination, increased urinary frequency, hematuria  Ext: no leg edema Neuro: no unilateral weakness, numbness, or tingling, no vision change or hearing loss. Has lightheadedness and syncope. Skin: no rash, no skin tear. MSK: No muscle spasm, no deformity, no limitation of range of movement in spin Heme: No easy bruising.  Travel history: No recent long distant travel.  Allergy:  Allergies  Allergen Reactions   Alendronate Nausea Only    Gi symptoms   Gluten Meal Nausea Only    Other reaction(s): NAUSEA Other reaction(s): NAUSEA Other reaction(s): NAUSEA   Lactose Intolerance (Gi) Diarrhea   Nsaids     GI upset. Other reaction(s): Other (See Comments) Pt unsure   Other Other (See Comments)    Other reaction(s): OTHER   Oxycodone-Acetaminophen    Oxycodone-Acetaminophen Other (See Comments)    Other reaction(s): NAUSEA   Vicodin [Hydrocodone-Acetaminophen] Nausea Only, Diarrhea and Nausea And Vomiting   Wheat Bran Other (See Comments)   Sulfa Antibiotics Rash    Rash from steri strips   Tape Rash    Rash from steri strips    Past Medical History:  Diagnosis Date   Arm fracture    right forearm, d/t fall   Bowel trouble 2010   Breast cancer ([place]) 12/2009   LEFT LUMPECTOMY   Cancer ([place]) 12/2009   Left UOQ breast tumor; wide excision,sn bx and whole breast radiation. Chemotherapy done. There was only a 18m area of residual tumor remaining. All sn were negative. This was ER-positive, PR-negative, HER-2 neu not over expressed tumor. The original ultrasound size was 2.6 cm(T2).  H/O cystitis 2011   Malignant neoplasm of upper-outer quadrant of female breast ([place]) January 13, 2010   2+ centimeter tumor, neoadjuvant chemotherapy. On wide excision 3 mm area of residual tumor. Sentinel node negative. ER-30%, PR-negative, HER-2 not overexpressing.   Parkinson disease ([place]) 04/2016   Dr Manuella Ghazi is pt s DR   Personal history of chemotherapy 2012   BREAST CA    Personal history of fibromyalgia 2002   Personal history of malignant neoplasm of large intestine    Personal history of radiation therapy 2012   BREAST CA   Special screening for malignant neoplasms, colon    Syncope    a. 03/2020 Echo: EF 50-55%, Gr1 DD, nl RV fxn, triv MR.   Unspecified constipation     Past Surgical History:  Procedure Laterality Date   ABDOMINAL HYSTERECTOMY  1974   BACK SURGERY  1985   bone spurs between 5&6, used bone from left hip   BREAST BIOPSY Left 2011   POS   BREAST CYST ASPIRATION Left 1985   BREAST LUMPECTOMY Left 2011   BREAST SURGERY Left 2011   wide excision   CERVICAL DISCECTOMY     COLON RESECTION  Sept 2014   [facility]    COLONOSCOPY  2010   Dr. Allen Norris, normal   FEMUR FRACTURE SURGERY Right March 2016   JOINT REPLACEMENT Bilateral Jan 2016 and March 2016   hip   PORT-A-CATH REMOVAL  2013   PORTACATH PLACEMENT  2011   SKIN CANCER EXCISION  1980   face   TUBAL LIGATION  1973   UPPER GI ENDOSCOPY  08/20/07   gastritis without hemorrhage    Social History:  reports that she has never smoked. She has never used smokeless tobacco. She reports that she does not drink alcohol and does not use drugs.  Family History:  Family History  Problem Relation Age of Onset   Cancer Brother        colon   Cancer Other        breast and ovarian cancers, relationships not listed   Stroke Mother    Osteoporosis Mother    Depression Sister    Depression Sister    Depression Sister    Cancer Other 22       niece   Breast cancer Neg Hx      Prior to Admission medications   Medication Sig Start Date End Date Taking? Authorizing Provider  ALPRAZolam Duanne Moron) 0.5 MG tablet Take 1 tablet (0.5 mg total) by mouth 3 (three) times daily as needed. 04/21/20   Sharen Hones, MD  aspirin EC 81 MG EC tablet Take 1 tablet (81 mg total) by mouth daily. Swallow whole. 04/16/20   Sharen Hones, MD  atorvastatin (LIPITOR) 20 MG tablet Take 1 tablet (20 mg total) by mouth  daily. 04/15/20 05/15/20  Sharen Hones, MD  carbidopa-levodopa (SINEMET IR) 25-100 MG tablet Take 2 tablets by mouth 3 (three) times daily. 04/15/20   Sharen Hones, MD  DULoxetine (CYMBALTA) 30 MG capsule Take 30 mg by mouth daily. 05/26/20   [provider]  fludrocortisone (FLORINEF) 0.1 MG tablet TAKE ONE TABLET BY MOUTH DAILY 07/30/20   Jerrol Banana., MD  lidocaine (LIDODERM) 5 % Place 1 patch onto the skin daily. Remove & Discard patch within 12 hours or as directed by MD 08/04/20   Nolberto Hanlon, MD  meclizine (ANTIVERT) 12.5 MG tablet Take 1 tablet (12.5 mg total) by mouth 3 (three) times daily. 08/03/20  Nolberto Hanlon, MD  metoprolol succinate (TOPROL-XL) 25 MG 24 hr tablet Take 0.5 tablets (12.5 mg total) by mouth at bedtime. 08/03/20   Nolberto Hanlon, MD  nystatin (MYCOSTATIN/NYSTOP) powder Apply topically 2 (two) times daily. 08/03/20   Nolberto Hanlon, MD  ondansetron (ZOFRAN-ODT) 4 MG disintegrating tablet Take 1 tablet (4 mg total) by mouth every 8 (eight) hours as needed for nausea or vomiting. 11/04/19   Jerrol Banana., MD  pantoprazole (PROTONIX) 40 MG tablet Take 1 tablet (40 mg total) by mouth daily. 04/15/20 05/15/20  Sharen Hones, MD  rOPINIRole (REQUIP) 1 MG tablet Take 4 mg by mouth at bedtime.    [provider]  traMADol (ULTRAM) 50 MG tablet Take 100 mg by mouth every 12 (twelve) hours as needed for moderate pain.    [provider]  vitamin B-12 (CYANOCOBALAMIN) 1000 MCG tablet Take 1 tablet (1,000 mcg total) by mouth daily. 04/15/20   Sharen Hones, MD    Physical Exam: Vitals:   08/21/20 1600 08/21/20 1630 08/21/20 1700 08/21/20 1730  BP: (!) 163/86 112/67 108/61 114/66  Pulse: 90 85 78 73  Resp: '18 20 16 16  ' Temp:      TempSrc:      SpO2: 96% 94% 93% 95%  Weight:      Height:       General: Not in acute distress HEENT:       Eyes: PERRL, EOMI, no scleral icterus.       ENT: No discharge from the ears and nose, no pharynx injection, no  tonsillar enlargement.        Neck: No JVD, no bruit, no mass felt. Heme: No neck lymph node enlargement. Cardiac: S1/S2, RRR, No murmurs, No gallops or rubs. Respiratory: No rales, wheezing, rhonchi or rubs. GI: Soft, nondistended, nontender, no rebound pain, no organomegaly, BS present. GU: No hematuria Ext: No pitting leg edema bilaterally. 1+DP/PT pulse bilaterally. Musculoskeletal: No joint deformities, No joint redness or warmth, no limitation of ROM in spin. Skin: No rashes.  Neuro: Alert, oriented X3, cranial nerves II-XII grossly intact, moves all extremities Psych: Patient is not psychotic, no suicidal or hemocidal ideation.  Labs on Admission: I have personally reviewed following labs and imaging studies  CBC: Recent Labs  Lab 08/21/20 1014  WBC 7.7  HGB 12.1  HCT 36.9  MCV 99.2  PLT 711   Basic Metabolic Panel: Recent Labs  Lab 08/21/20 1014 08/21/20 1430  NA 142  --   K 3.0*  --   CL 105  --   CO2 28  --   GLUCOSE 110*  --   BUN 11  --   CREATININE 1.02*  --   CALCIUM 9.1  --   MG  --  0.7*   GFR: Estimated Creatinine Clearance: 54.7 mL/min (A) (by C-G formula based on SCr of 1.02 mg/dL (H)). Liver Function Tests: No results for input(s): AST, ALT, ALKPHOS, BILITOT, PROT, ALBUMIN in the last 168 hours. No results for input(s): LIPASE, AMYLASE in the last 168 hours. No results for input(s): AMMONIA in the last 168 hours. Coagulation Profile: No results for input(s): INR, PROTIME in the last 168 hours. Cardiac Enzymes: No results for input(s): CKTOTAL, CKMB, CKMBINDEX, TROPONINI in the last 168 hours. BNP (last 3 results) No results for input(s): PROBNP in the last 8760 hours. HbA1C: No results for input(s): HGBA1C in the last 72 hours. CBG: No results for input(s): GLUCAP in the last 168 hours. Lipid Profile: No  results for input(s): CHOL, HDL, LDLCALC, TRIG, CHOLHDL, LDLDIRECT in the last 72 hours. Thyroid Function Tests: No results for  input(s): TSH, T4TOTAL, FREET4, T3FREE, THYROIDAB in the last 72 hours. Anemia Panel: No results for input(s): VITAMINB12, FOLATE, FERRITIN, TIBC, IRON, RETICCTPCT in the last 72 hours. Urine analysis:    Component Value Date/Time   COLORURINE YELLOW (A) 08/21/2020 1019   APPEARANCEUR CLOUDY (A) 08/21/2020 1019   APPEARANCEUR Clear 02/26/2014 0246   LABSPEC 1.014 08/21/2020 1019   LABSPEC 1.025 02/26/2014 0246   PHURINE 7.0 08/21/2020 1019   GLUCOSEU NEGATIVE 08/21/2020 1019   GLUCOSEU Negative 02/26/2014 0246   HGBUR NEGATIVE 08/21/2020 1019   BILIRUBINUR NEGATIVE 08/21/2020 1019   BILIRUBINUR Negative 02/26/2014 0246   KETONESUR 5 (A) 08/21/2020 1019   PROTEINUR 100 (A) 08/21/2020 1019   NITRITE NEGATIVE 08/21/2020 1019   LEUKOCYTESUR SMALL (A) 08/21/2020 1019   LEUKOCYTESUR Negative 02/26/2014 0246   Sepsis Labs: '@LABRCNTIP' (procalcitonin:4,lacticidven:4) ) Recent Results (from the past 240 hour(s))  Resp Panel by RT-PCR (Flu A&B, Covid) Nasopharyngeal Swab     Status: None   Collection Time: 08/21/20  4:22 PM   Specimen: Nasopharyngeal Swab; Nasopharyngeal(NP) swabs in vial transport medium  Result Value Ref Range Status   SARS Coronavirus 2 by RT PCR NEGATIVE NEGATIVE Final    Comment: (NOTE) SARS-CoV-2 target nucleic acids are NOT DETECTED.  The SARS-CoV-2 RNA is generally detectable in upper respiratory specimens during the acute phase of infection. The lowest concentration of SARS-CoV-2 viral copies this assay can detect is 138 copies/mL. A negative result does not preclude SARS-Cov-2 infection and should not be used as the sole basis for treatment or other patient management decisions. A negative result may occur with  improper specimen collection/handling, submission of specimen other than nasopharyngeal swab, presence of viral mutation(s) within the areas targeted by this assay, and inadequate number of viral copies(<138 copies/mL). A negative result must be  combined with clinical observations, patient history, and epidemiological information. The expected result is Negative.  Fact Sheet for Patients:  EntrepreneurPulse.com.au  Fact Sheet for Healthcare Providers:  IncredibleEmployment.be  This test is no t yet approved or cleared by the [place] FDA and  has been authorized for detection and/or diagnosis of SARS-CoV-2 by FDA under an Emergency Use Authorization (EUA). This EUA will remain  in effect (meaning this test can be used) for the duration of the COVID-19 declaration under Section 564(b)(1) of the Act, 21 U.S.C.section 360bbb-3(b)(1), unless the authorization is terminated  or revoked sooner.       Influenza A by PCR NEGATIVE NEGATIVE Final   Influenza B by PCR NEGATIVE NEGATIVE Final    Comment: (NOTE) The Xpert Xpress SARS-CoV-2/FLU/RSV plus assay is intended as an aid in the diagnosis of influenza from Nasopharyngeal swab specimens and should not be used as a sole basis for treatment. Nasal washings and aspirates are unacceptable for Xpert Xpress SARS-CoV-2/FLU/RSV testing.  Fact Sheet for Patients: EntrepreneurPulse.com.au  Fact Sheet for Healthcare Providers: IncredibleEmployment.be  This test is not yet approved or cleared by the [place] FDA and has been authorized for detection and/or diagnosis of SARS-CoV-2 by FDA under an Emergency Use Authorization (EUA). This EUA will remain in effect (meaning this test can be used) for the duration of the COVID-19 declaration under Section 564(b)(1) of the Act, 21 U.S.C. section 360bbb-3(b)(1), unless the authorization is terminated or revoked.  Performed at [facility], [address] [place], [place] [address]  Radiological Exams on Admission: CT Head Wo Contrast  Result Date: 08/21/2020 CLINICAL DATA:  Syncope EXAM: CT HEAD WITHOUT CONTRAST TECHNIQUE: Contiguous axial  images were obtained from the base of the skull through the vertex without intravenous contrast. COMPARISON:  04/13/2020 FINDINGS: Brain: The brainstem, cerebellum, cerebral peduncles, thalami, basal ganglia, basilar cisterns, and ventricular system appear within normal limits. Periventricular white matter and corona radiata hypodensities favor chronic ischemic microvascular white matter disease. No intracranial hemorrhage, mass lesion, or acute CVA. Vascular: Unremarkable Skull: Unremarkable Sinuses/Orbits: Unremarkable Other: No supplemental non-categorized findings. IMPRESSION: 1. No acute intracranial findings. 2. Periventricular white matter and corona radiata hypodensities favor chronic ischemic microvascular white matter disease. Electronically Signed   By: Van Clines M.D.   On: 08/21/2020 15:22   DG Chest Portable 1 View  Result Date: 08/21/2020 CLINICAL DATA:  Syncope EXAM: PORTABLE CHEST 1 VIEW COMPARISON:  April 13, 2020. FINDINGS: Similar enlargement the cardiac silhouette. No consolidation. No visible pleural effusions or pneumothorax on this single AP radiograph. No evidence of acute osseous abnormality. IMPRESSION: 1. No evidence of acute cardiopulmonary disease. 2. Cardiomegaly. Electronically Signed   By: Margaretha Sheffield MD   On: 08/21/2020 14:29     EKG: I have personally reviewed.  Sinus rhythm, QTC 460, low voltage, early R wave progression  Assessment/Plan Principal Problem:   Syncope Active Problems:   GERD (gastroesophageal reflux disease)   Parkinson disease (HCC)   Orthostatic hypotension dysautonomic syndrome   HTN (hypertension)   HLD (hyperlipidemia)   CKD (chronic kidney disease), stage IIIa   Hypomagnesemia   Syncope: Etiology is not clear, patient has history of orthostatic hypotension due to dysautonomic syndrome, which is likely the etiology.  On physical examination, patient talks slowly, seems to have difficulty talking.  CT head negative for acute  intracranial abnormalities.  Will get MRI to rule out stroke.  -Placed on MedSurg bed for observation -Frequent neuro check -Check orthostatic vital sign -Normal saline 75 cc/h -Patient is on Florinef -Follow-up MRI for brain -PT/OT  GERD (gastroesophageal reflux disease) -MRI-brain  Parkinson disease ([place]) -Sinemet  Orthostatic hypotension dysautonomic syndrome -Fludrocortisone  HTN (hypertension) -IV hydralazine as needed -Metoprolol  HLD (hyperlipidemia) -Lipitor  CKD (chronic kidney disease), stage IIIa: Close to baseline.  Creatinine 1.02, BUN 11 -Follow-up renal function by BMP  Hypokalemia and hypomagnesemia: Potassium 3.0 and magnesium 0.7 -Give potassium chloride 10 mEq x 5 by IV (patient refused oral potassium) -Magnesium sulfate 4 g by IV -Check phosphorus level    DVT ppx: SQ Lovenox Code Status: DNR  per her husband Family Communication:  Yes, patient's husband by phone Disposition Plan:  Anticipate discharge back to previous environment Consults called:  none Admission status and Level of care: Med-Surg:    for obs     Status is: Observation  The patient remains OBS appropriate and will d/c before 2 midnights.  Dispo: The patient is from: SNF              Anticipated d/c is to: SNF              Patient currently is not medically stable to d/c.   Difficult to place patient No           Date of Service 08/21/2020    Ivor Costa [facility]   If 7PM-7AM, please contact night-coverage [email or web address] 08/21/2020, 5:45 PM

## 2020-08-21 NOTE — ED Notes (Signed)
Pt back from CT

## 2020-08-21 NOTE — ED Notes (Signed)
Dosing and admin of potassium phos infusion confirmed and verified with Rx.

## 2020-08-22 ENCOUNTER — Other Ambulatory Visit: Payer: Self-pay

## 2020-08-22 ENCOUNTER — Encounter: Payer: Self-pay | Admitting: Internal Medicine

## 2020-08-22 DIAGNOSIS — R55 Syncope and collapse: Secondary | ICD-10-CM | POA: Diagnosis not present

## 2020-08-22 LAB — COMPREHENSIVE METABOLIC PANEL
ALT: 5 U/L (ref 0–44)
AST: 16 U/L (ref 15–41)
Albumin: 3.6 g/dL (ref 3.5–5.0)
Alkaline Phosphatase: 65 U/L (ref 38–126)
Anion gap: 12 (ref 5–15)
BUN: 12 mg/dL (ref 8–23)
CO2: 27 mmol/L (ref 22–32)
Calcium: 9.5 mg/dL (ref 8.9–10.3)
Chloride: 103 mmol/L (ref 98–111)
Creatinine, Ser: 0.73 mg/dL (ref 0.44–1.00)
GFR, Estimated: >60 mL/min (ref >60)
Glucose, Bld: 101 mg/dL — ABNORMAL HIGH (ref 70–99)
Potassium: 3.5 mmol/L (ref 3.5–5.1)
Sodium: 142 mmol/L (ref 135–145)
Total Bilirubin: 0.8 mg/dL (ref 0.3–1.2)
Total Protein: 7.1 g/dL (ref 6.5–8.1)

## 2020-08-22 LAB — MAGNESIUM: Magnesium: 2.1 mg/dL (ref 1.7–2.4)

## 2020-08-22 LAB — PHOSPHORUS: Phosphorus: 4.3 mg/dL (ref 2.5–4.6)

## 2020-08-22 MED ORDER — TRAMADOL HCL 50 MG PO TABS
100.0000 mg | ORAL_TABLET | Freq: Two times a day (BID) | ORAL | Status: DC | PRN
Start: 2020-08-22 — End: 2020-08-22
  Administered 2020-08-22: 10:00:00 100 mg via ORAL
  Filled 2020-08-22: qty 2

## 2020-08-22 MED ORDER — MECLIZINE HCL 25 MG PO TABS
12.5000 mg | ORAL_TABLET | Freq: Three times a day (TID) | ORAL | Status: DC | PRN
  Filled 2020-08-22: qty 0.5

## 2020-08-22 MED ORDER — ROPINIROLE HCL 1 MG PO TABS: 4.0000 mg | ORAL_TABLET | Freq: Every day | ORAL | 0 refills | Status: DC

## 2020-08-22 NOTE — Progress Notes (Signed)
Patient refused Lipitor and Cymbalta this morning. She states she has not been taking

## 2020-08-22 NOTE — Evaluation (Signed)
Occupational Therapy Evaluation Patient Details Name: Terri Perez MRN: [id number] DOB: 28-Sep-1945 Today's Date: 08/22/2020    History of Present Illness 75 y.o. female with medical history significant of Parkinson's disease, orthostatic hypotension dysautonomic syndrome, hypertension, hyperlipidemia, GERD, depression with anxiety, syncope, breast cancer (s/p of left lumpectomy, radiation and chemotherapy), CKD stage IIIa, fibromyalgia, IBS, who presents with syncope.   Apparently she has had multiple falls/recurring syncope that is increasing recently.   Clinical Impression   Terri Perez was seen for OT evaluation this date. Prior to hospital admission, pt was at [facility]. Pt lives with husband in home c ramped entrance. Pt presents to acute OT demonstrating impaired ADL performance and functional mobility 2/2 decreased activity tolerance, functional strength/ROM/balance deficits, and poor insight into deficits. Pt currently requires CGA + RW for toilet t/f and perihyigene in standing. SBA + RW for hand washing standing sinkside. Pt would benefit from skilled OT to address noted impairments and functional limitations (see below for any additional details) in order to maximize safety and independence while minimizing falls risk and caregiver burden. Upon hospital discharge, recommend HHOT to maximize pt safety and return to functional independence during meaningful occupations of daily life.     Follow Up Recommendations  Home health OT;Supervision/Assistance - 24 hour    Equipment Recommendations  3 in 1 bedside commode    Recommendations for Other Services       Precautions / Restrictions Precautions Precautions: Fall Restrictions Weight Bearing Restrictions: No      Mobility Bed Mobility Overal bed mobility: Needs Assistance Bed Mobility: Supine to Sit;Sit to Supine     Supine to sit: Min assist Sit to supine: Min assist   General bed mobility comments: assist for BLE  mgmt    Transfers Overall transfer level: Needs assistance Equipment used: Rolling walker (2 wheeled) Transfers: Sit to/from Stand Sit to Stand: Min guard         General transfer comment: CGA + cues for safety    Balance Overall balance assessment: Needs assistance Sitting-balance support: No upper extremity supported;Feet supported Sitting balance-Leahy Scale: Good Sitting balance - Comments: maintains EOB sitting w/o assist or issue   Standing balance support: No upper extremity supported;During functional activity Standing balance-Leahy Scale: Fair Standing balance comment: no LOBs                           ADL either performed or assessed with clinical judgement   ADL Overall ADL's : Needs assistance/impaired                                       General ADL Comments: CGA + RW for toilet t/f and perihyigene in standing. SBA + RW for hand washing standing sinkside      Pertinent Vitals/Pain Pain Assessment: No/denies pain     Hand Dominance Right   Extremity/Trunk Assessment Upper Extremity Assessment Upper Extremity Assessment: Overall WFL for tasks assessed   Lower Extremity Assessment Lower Extremity Assessment: Overall WFL for tasks assessed       Communication Communication Communication: No difficulties   Cognition Arousal/Alertness: Lethargic Behavior During Therapy: WFL for tasks assessed/performed Overall Cognitive Status: Difficult to assess                                 General Comments:  Pt aware of month and setting, repeats comments consistently t/o the session.  Difficult to keep on tasks, but able to redirect at times.   General Comments  pt reports lightheadedness upon return to bed, sits quickly - seated BP 126/69, MAP 87, HR 99    Exercises Exercises: Other exercises Other Exercises Other Exercises: Pt educated re: OT role, DME recs, d/c recs, falls prevention, ECS, home/routines  modifications Other Exercises: Toileting, grooming, sup<>sit, sit<>stand, sitting/standing balance/tolerance   Shoulder Instructions      Home Living Family/patient expects to be discharged to:: Private residence Living Arrangements: Spouse/significant other Available Help at Discharge: Available 24 hours/day Type of Home: House Home Access: [place]: One level     Bathroom Shower/Tub: [place]: Environmental consultant - 2 wheels;Bedside commode;Shower seat;Wheelchair - manual;Hand held shower head;Hospital bed   Additional Comments: Pt sleeps in lift chair per chart review      Prior Functioning/Environment Level of Independence: Needs assistance  Gait / Transfers Assistance Needed: Pt reports ambulating with use of RW and assist from husband. Multiple falls in home. ADL's / Homemaking Assistance Needed: Pt bathing in shower seat with husband assisting with bathing and dressing tasks per pt report.            OT Problem List: Decreased strength;Decreased activity tolerance;Decreased safety awareness;Impaired balance (sitting and/or standing);Decreased knowledge of use of DME or AE;Decreased knowledge of precautions      OT Treatment/Interventions: Self-care/ADL training;Balance training;Therapeutic exercise;Therapeutic activities;Energy conservation;Cognitive remediation/compensation;DME and/or AE instruction;Patient/family education;Manual therapy    OT Goals(Current goals can be found in the care plan section) Acute Rehab OT Goals Patient Stated Goal: go home today OT Goal Formulation: With patient Time For Goal Achievement: 09/05/20 Potential to Achieve Goals: Fair ADL Goals Pt Will Perform Grooming: Independently;standing Pt Will Perform Lower Body Dressing: Independently;sit to/from stand Pt Will Transfer to Toilet: Independently;ambulating;regular height toilet  OT Frequency: Min 2X/week    AM-PAC OT "6 Clicks" Daily  Activity     Outcome Measure Help from another person eating meals?: None Help from another person taking care of personal grooming?: A Little Help from another person toileting, which includes using toliet, bedpan, or urinal?: A Little Help from another person bathing (including washing, rinsing, drying)?: A Little Help from another person to put on and taking off regular upper body clothing?: None Help from another person to put on and taking off regular lower body clothing?: A Little 6 Click Score: 20   End of Session Equipment Utilized During Treatment: Rolling walker  Activity Tolerance: Patient tolerated treatment well Patient left: in bed;with call bell/phone within reach;with bed alarm set  OT Visit Diagnosis: Unsteadiness on feet (R26.81);Repeated falls (R29.6);Muscle weakness (generalized) (M62.81);History of falling (Z91.81)                Time: [id number] OT Time Calculation (min): 26 min Charges:  OT General Charges $OT Visit: 1 Visit OT Evaluation $OT Eval Low Complexity: 1 Low OT Treatments $Self Care/Home Management : 8-22 mins  Dessie Coma, M.S. OTR/L  08/22/20, 1:53 PM  ascom [phone]

## 2020-08-22 NOTE — ED Notes (Signed)
Pt requested and received [place] sandwich and more warm blankets. NAD.

## 2020-08-22 NOTE — ED Notes (Signed)
Pt. Transported by this RN to Rm. 101A, with cont. Cardiac monitoring in place after receiving confirmation room was ready. Upon arrival, room was not clean. Pt. transported back to ED rm. 36. Simona Huh, RN assures this RN he will notify her when room is clean and ready.

## 2020-08-22 NOTE — Progress Notes (Signed)
Patient is being discharged home with husband to self care. Husband is here for transportation home. IV has been removed. Patient and husband have been educated about discharge instruction. A copy of discharge instruction is in the chart.

## 2020-08-22 NOTE — Discharge Summary (Signed)
Physician Discharge Summary  Terri Perez [id number] DOB: [birth year] DOA: 08/21/2020  PCP: Jerrol Banana., MD  Admit date: 08/21/2020 Discharge date: 08/22/2020  Admitted From: [facility] Discharge disposition: Home   Code Status: DNR   Discharge Diagnosis:   Principal Problem:   Syncope Active Problems:   GERD (gastroesophageal reflux disease)   Parkinson disease ([place])   Orthostatic hypotension dysautonomic syndrome   HTN (hypertension)   HLD (hyperlipidemia)   CKD (chronic kidney disease), stage IIIa   Hypomagnesemia    Chief complaint: Syncope  Brief narrative: Terri Perez is a 75 y.o. female with PMH significant for Parkinson's disease, orthostatic hypotension dysautonomic syndrome, hx of syncope, hypertension, hyperlipidemia, GERD, depression/anxiety, breast cancer (s/p of left lumpectomy, radiation and chemotherapy), CKD stage IIIa, fibromyalgia, IBS. Patient was brought to the ED from [facility] on 7/29 for syncope.  While getting up from her toilet seat, patient became lightheaded and passed out for about 30 minutes per husband. EMS found her unconscious, hard to arouse and brought her to the ED. Of note, she was recently started on new blood pressure medicine  By the time of arrival in the ED, patient was alert, awake, oriented x3 and was able to follow commands. In the ED, blood pressure 151/74, afebrile, heart rate 88, breathing on room air CT head and MRI brain did not show any intracranial abnormality. Chest x-ray normal. Labs with potassium low at 3.  Urinalysis with cloudy yellow urine, small amount of leukocytes Respiratory virus panel including COVID and influenza negative EKG showed normal sinus rhythm at the rate of 86 bpm with QTC at 460 ms, no ST-T wave changes. Patient was kept under observation and hospitalist service.  Subjective: Patient was seen and examined this morning.  Pleasant elderly Caucasian female.   Lying down in bed.  Not in distress.  She walked few steps with physical therapy earlier.  Did not get lightheaded.  Did not pass out. Husband at bedside.  Questions answered.  They did not want any home health physical therapy services at this time.  Hospital course: Syncope  History of orthostatic hypotension and dysautonomia with Parkinson's disease -Presented with syncope while getting up from toilet seat, lasted about 30 minutes. -By the time of arrival, her blood pressure was normal. -Adequately hydrated with IV normal saline. -Continue Florinef -MRI brain did not show any intracranial acute abnormality but showed chronic ischemic changes  Essential hypertension with episodes of orthostatic hypotension -Continue metoprolol succinate 12.5 mg daily and Florinef 100 mcg daily -Also seems to be using meclizine 12.5 mg 3 times daily as needed  Parkinson's disease -Continue Sinemet  Hyperlipidemia -Aspirin and Lipitor  Dehydration CKD stage IIIa -Creatinine was slightly elevated 1.02 yesterday on admission which is higher than her baseline of less than 0.9.  Insufficient rising creatinine to call AKI.  Dehydration improved with IV fluid. Recent Labs    04/13/20 2316 04/14/20 0107 04/15/20 0848 04/16/20 0543 04/17/20 0752 04/19/20 0359 07/29/20 1610 07/31/20 0412 08/21/20 1014 08/22/20 0625  BUN '13 13 13 18 '$ 24* 29* '17 17 11 12  '$ CREATININE 1.08* 1.00 1.10* 0.90 0.95 0.99 0.81 0.85 1.02* 0.73   Hypokalemia/hypomagnesemia/hypophosphatemia -Potassium, magnesium and phosphorus level were low on admission.  Replaced aggressively.  Improved back to normal on repeat labs this morning. Recent Labs  Lab 08/21/20 1014 08/21/20 1430 08/22/20 0625  K 3.0*  --  3.5  MG  --  0.7* 2.1  PHOS  --  <1.0*  4.3   GERD -PPI  Fibromyalgia, IBS, restless leg syndrome -Continue home meds that include Requip 4 mg at bedtime, tramadol 100 mg twice daily as needed, Cymbalta 30 mg daily,  Xanax 0.5 mg 3 times daily as needed   Allergies as of 08/22/2020       Reactions   Alendronate Nausea Only   Gi symptoms   Gluten Meal Nausea Only   Other reaction(s): NAUSEA Other reaction(s): NAUSEA Other reaction(s): NAUSEA   Lactose Intolerance (gi) Diarrhea   Nsaids    GI upset. Other reaction(s): Other (See Comments) Pt unsure   Other Other (See Comments)   Other reaction(s): OTHER   Oxycodone-acetaminophen    Oxycodone-acetaminophen Other (See Comments)   Other reaction(s): NAUSEA   Vicodin [hydrocodone-acetaminophen] Nausea Only, Diarrhea, Nausea And Vomiting   Wheat Bran Other (See Comments)   Sulfa Antibiotics Rash   Rash from steri strips   Tape Rash   Rash from steri strips        Medication List     STOP taking these medications    nystatin powder Commonly known as: MYCOSTATIN/NYSTOP       TAKE these medications    ALPRAZolam 0.5 MG tablet Commonly known as: XANAX Take 1 tablet (0.5 mg total) by mouth 3 (three) times daily as needed.   aspirin 81 MG EC tablet Take 1 tablet (81 mg total) by mouth daily. Swallow whole.   atorvastatin 20 MG tablet Commonly known as: Lipitor Take 1 tablet (20 mg total) by mouth daily.   carbidopa-levodopa 25-100 MG tablet Commonly known as: SINEMET IR Take 2 tablets by mouth 3 (three) times daily.   DULoxetine 30 MG capsule Commonly known as: CYMBALTA Take 30 mg by mouth daily.   fludrocortisone 0.1 MG tablet Commonly known as: FLORINEF TAKE ONE TABLET BY MOUTH DAILY   lidocaine 5 % Commonly known as: LIDODERM Place 1 patch onto the skin daily. Remove & Discard patch within 12 hours or as directed by MD   meclizine 12.5 MG tablet Commonly known as: ANTIVERT Take 1 tablet (12.5 mg total) by mouth 3 (three) times daily.   metoprolol succinate 25 MG 24 hr tablet Commonly known as: TOPROL-XL Take 0.5 tablets (12.5 mg total) by mouth at bedtime.   omeprazole 20 MG capsule Commonly known as:  PRILOSEC Take 20 mg by mouth daily.   ondansetron 4 MG disintegrating tablet Commonly known as: ZOFRAN-ODT Take 1 tablet (4 mg total) by mouth every 8 (eight) hours as needed for nausea or vomiting.   pantoprazole 40 MG tablet Commonly known as: Protonix Take 1 tablet (40 mg total) by mouth daily.   rOPINIRole 1 MG tablet Commonly known as: REQUIP Take 4 tablets (4 mg total) by mouth at bedtime.   traMADol 50 MG tablet Commonly known as: ULTRAM Take 100 mg by mouth every 12 (twelve) hours as needed for moderate pain.   vitamin B-12 1000 MCG tablet Commonly known as: CYANOCOBALAMIN Take 1 tablet (1,000 mcg total) by mouth daily.        Discharge Instructions:  Diet Recommendation: Cardiac diet   Follow with Primary MD Jerrol Banana., MD in 7 days   Get CBC/BMP checked in next visit within 1 week by PCP or SNF MD ( we routinely change or add medications that can affect your baseline labs and fluid status, therefore we recommend that you get the mentioned basic workup next visit with your PCP, your PCP may decide not to get them or  add new tests based on their clinical decision)  On your next visit with your PCP, please Get Medicines reviewed and adjusted.  Please request your PCP  to go over all Hospital Tests and Procedure/Radiological results at the follow up, please get all Hospital records sent to your Prim MD by signing hospital release before you go home.  Activity: As tolerated with Full fall precautions use walker/cane & assistance as needed  For Heart failure patients - Check your Weight same time everyday, if you gain over 2 pounds, or you develop in leg swelling, experience more shortness of breath or chest pain, call your Primary MD immediately. Follow Cardiac Low Salt Diet and 1.5 lit/day fluid restriction.  If you have smoked or chewed Tobacco in the last 2 yrs please stop smoking, stop any regular Alcohol  and or any Recreational drug use.  If you  experience worsening of your admission symptoms, develop shortness of breath, life threatening emergency, suicidal or homicidal thoughts you must seek medical attention immediately by calling 911 or calling your MD immediately  if symptoms less severe.  You Must read complete instructions/literature along with all the possible adverse reactions/side effects for all the Medicines you take and that have been prescribed to you. Take any new Medicines after you have completely understood and accpet all the possible adverse reactions/side effects.   Do not drive, operate heavy machinery, perform activities at heights, swimming or participation in water activities or provide baby sitting services if your were admitted for syncope or siezures until you have seen by Primary MD or a Neurologist and advised to do so again.  Do not drive when taking Pain medications.  Do not take more than prescribed Pain, Sleep and Anxiety Medications  Wear Seat belts while driving.   Please note You were cared for by a hospitalist during your hospital stay. If you have any questions about your discharge medications or the care you received while you were in the hospital after you are discharged, you can call the unit and asked to speak with the hospitalist on call if the hospitalist that took care of you is not available. Once you are discharged, your primary care physician will handle any further medical issues. Please note that NO REFILLS for any discharge medications will be authorized once you are discharged, as it is imperative that you return to your primary care physician (or establish a relationship with a primary care physician if you do not have one) for your aftercare needs so that they can reassess your need for medications and monitor your lab values.    Follow ups:    Follow-up Information     Jerrol Banana., MD Follow up.   Specialty: Family Medicine Contact information: [address] [place] [address] [phone]         Nelva Bush, MD .   Specialty: Cardiology Contact information: [address] [place] [id number] [phone]                 Wound care:     Discharge Exam:   Vitals:   08/22/20 0100 08/22/20 0142 08/22/20 0603 08/22/20 0858  BP: (!) 147/86 (!) 113/96 (!) 172/90 (!) 165/99  Pulse: 83 90 (!) 102 100  Resp: '12 17 17 18  '$ Temp:  97.8 F (36.6 C) 97.7 F (36.5 C) 98.4 F (36.9 C)  TempSrc:      SpO2: 95% 95% 96% 96%  Weight:  85.3 kg  Height:  '5\' 9"'$  (1.753 m)      Body mass index is 27.77 kg/m.  General exam: Pleasant, elderly Caucasian female.  Not in distress Skin: No rashes, lesions or ulcers. HEENT: Atraumatic, normocephalic, no obvious bleeding Lungs: Clear to auscultation bilaterally CVS: Regular rate and rhythm, no murmur GI/Abd soft, nontender, nondistended, bowel sound present CNS: Alert, awake, oriented x3 Psychiatry: Mood appropriate Extremities: No edema, no calf tenderness  Time coordinating discharge: 35 minutes   The results of significant diagnostics from this hospitalization (including imaging, microbiology, ancillary and laboratory) are listed below for reference.    Procedures and Diagnostic Studies:   CT Head Wo Contrast  Result Date: 08/21/2020 CLINICAL DATA:  Syncope EXAM: CT HEAD WITHOUT CONTRAST TECHNIQUE: Contiguous axial images were obtained from the base of the skull through the vertex without intravenous contrast. COMPARISON:  04/13/2020 FINDINGS: Brain: The brainstem, cerebellum, cerebral peduncles, thalami, basal ganglia, basilar cisterns, and ventricular system appear within normal limits. Periventricular white matter and corona radiata hypodensities favor chronic ischemic microvascular white matter disease. No intracranial hemorrhage, mass lesion, or acute CVA. Vascular: Unremarkable Skull: Unremarkable Sinuses/Orbits: Unremarkable Other: No supplemental  non-categorized findings. IMPRESSION: 1. No acute intracranial findings. 2. Periventricular white matter and corona radiata hypodensities favor chronic ischemic microvascular white matter disease. Electronically Signed   By: Van Clines M.D.   On: 08/21/2020 15:22   MR BRAIN WO CONTRAST  Result Date: 08/21/2020 CLINICAL DATA:  Initial evaluation for acute syncope. EXAM: MRI HEAD WITHOUT CONTRAST TECHNIQUE: Multiplanar, multiecho pulse sequences of the brain and surrounding structures were obtained without intravenous contrast. COMPARISON:  CT from earlier the same day. FINDINGS: Brain: Generalized age-related cerebral atrophy. Scattered patchy T2/FLAIR hyperintensity within the periventricular and deep white matter both cerebral hemispheres as well as the pons, most consistent with chronic small vessel ischemic disease, mild in nature. No abnormal foci of restricted diffusion to suggest acute or subacute ischemia. Gray-white matter differentiation maintained. No encephalomalacia to suggest chronic cortical infarction. No acute intracranial hemorrhage. Single punctate chronic microhemorrhage noted within the left thalamus, of doubtful significance in isolation. No mass lesion, midline shift or mass effect. No hydrocephalus or extra-axial fluid collection. Pituitary gland suprasellar region within normal limits. Midline structures intact. Vascular: Major intracranial vascular flow voids are maintained. Tortuosity of the supraclinoid right ICA with mild mass effect on the pre chiasmatic right optic nerve noted. Skull and upper cervical spine: Craniocervical junction within normal limits. Bone marrow signal intensity normal. No scalp soft tissue abnormality. Sinuses/Orbits: Globes and orbital soft tissues within normal limits. Paranasal sinuses are clear. No significant mastoid effusion. Inner ear structures grossly normal. Other: Incidental note made of a 1.5 cm cystic lesion at the left upper posterior neck  (series 17, image 13), indeterminate, but possibly reflecting a small cystic lymph node. IMPRESSION: 1. No acute intracranial abnormality. 2. Generalized age-related cerebral atrophy with mild chronic small vessel ischemic disease. 3. 1.5 cm cystic lesion at the left upper posterior neck, indeterminate, but possibly reflecting a small cystic lymph node. Correlation with physical exam recommended. Further assessment with dedicated cross-sectional imaging of the neck could be performed as clinically warranted. Electronically Signed   By: Jeannine Boga M.D.   On: 08/21/2020 21:51   DG Chest Portable 1 View  Result Date: 08/21/2020 CLINICAL DATA:  Syncope EXAM: PORTABLE CHEST 1 VIEW COMPARISON:  April 13, 2020. FINDINGS: Similar enlargement the cardiac silhouette. No consolidation. No visible pleural effusions or pneumothorax on this single AP radiograph. No evidence of  acute osseous abnormality. IMPRESSION: 1. No evidence of acute cardiopulmonary disease. 2. Cardiomegaly. Electronically Signed   By: Margaretha Sheffield MD   On: 08/21/2020 14:29     Labs:   Basic Metabolic Panel: Recent Labs  Lab 08/21/20 1014 08/21/20 1430 08/22/20 0625  NA 142  --  142  K 3.0*  --  3.5  CL 105  --  103  CO2 28  --  27  GLUCOSE 110*  --  101*  BUN 11  --  12  CREATININE 1.02*  --  0.73  CALCIUM 9.1  --  9.5  MG  --  0.7* 2.1  PHOS  --  <1.0* 4.3   GFR Estimated Creatinine Clearance: 70.8 mL/min (by C-G formula based on SCr of 0.73 mg/dL). Liver Function Tests: Recent Labs  Lab 08/22/20 0625  AST 16  ALT 5  ALKPHOS 65  BILITOT 0.8  PROT 7.1  ALBUMIN 3.6   No results for input(s): LIPASE, AMYLASE in the last 168 hours. No results for input(s): AMMONIA in the last 168 hours. Coagulation profile No results for input(s): INR, PROTIME in the last 168 hours.  CBC: Recent Labs  Lab 08/21/20 1014  WBC 7.7  HGB 12.1  HCT 36.9  MCV 99.2  PLT 225   Cardiac Enzymes: No results for  input(s): CKTOTAL, CKMB, CKMBINDEX, TROPONINI in the last 168 hours. BNP: Invalid input(s): POCBNP CBG: No results for input(s): GLUCAP in the last 168 hours. D-Dimer No results for input(s): DDIMER in the last 72 hours. Hgb A1c No results for input(s): HGBA1C in the last 72 hours. Lipid Profile No results for input(s): CHOL, HDL, LDLCALC, TRIG, CHOLHDL, LDLDIRECT in the last 72 hours. Thyroid function studies No results for input(s): TSH, T4TOTAL, T3FREE, THYROIDAB in the last 72 hours.  Invalid input(s): FREET3 Anemia work up No results for input(s): VITAMINB12, FOLATE, FERRITIN, TIBC, IRON, RETICCTPCT in the last 72 hours. Microbiology Recent Results (from the past 240 hour(s))  Resp Panel by RT-PCR (Flu A&B, Covid) Nasopharyngeal Swab     Status: None   Collection Time: 08/21/20  4:22 PM   Specimen: Nasopharyngeal Swab; Nasopharyngeal(NP) swabs in vial transport medium  Result Value Ref Range Status   SARS Coronavirus 2 by RT PCR NEGATIVE NEGATIVE Final    Comment: (NOTE) SARS-CoV-2 target nucleic acids are NOT DETECTED.  The SARS-CoV-2 RNA is generally detectable in upper respiratory specimens during the acute phase of infection. The lowest concentration of SARS-CoV-2 viral copies this assay can detect is 138 copies/mL. A negative result does not preclude SARS-Cov-2 infection and should not be used as the sole basis for treatment or other patient management decisions. A negative result may occur with  improper specimen collection/handling, submission of specimen other than nasopharyngeal swab, presence of viral mutation(s) within the areas targeted by this assay, and inadequate number of viral copies(<138 copies/mL). A negative result must be combined with clinical observations, patient history, and epidemiological information. The expected result is Negative.  Fact Sheet for Patients:  EntrepreneurPulse.com.au  Fact Sheet for Healthcare Providers:   IncredibleEmployment.be  This test is no t yet approved or cleared by the [place] FDA and  has been authorized for detection and/or diagnosis of SARS-CoV-2 by FDA under an Emergency Use Authorization (EUA). This EUA will remain  in effect (meaning this test can be used) for the duration of the COVID-19 declaration under Section 564(b)(1) of the Act, 21 U.S.C.section 360bbb-3(b)(1), unless the authorization is terminated  or revoked sooner.  Influenza A by PCR NEGATIVE NEGATIVE Final   Influenza B by PCR NEGATIVE NEGATIVE Final    Comment: (NOTE) The Xpert Xpress SARS-CoV-2/FLU/RSV plus assay is intended as an aid in the diagnosis of influenza from Nasopharyngeal swab specimens and should not be used as a sole basis for treatment. Nasal washings and aspirates are unacceptable for Xpert Xpress SARS-CoV-2/FLU/RSV testing.  Fact Sheet for Patients: EntrepreneurPulse.com.au  Fact Sheet for Healthcare Providers: IncredibleEmployment.be  This test is not yet approved or cleared by the [place] FDA and has been authorized for detection and/or diagnosis of SARS-CoV-2 by FDA under an Emergency Use Authorization (EUA). This EUA will remain in effect (meaning this test can be used) for the duration of the COVID-19 declaration under Section 564(b)(1) of the Act, 21 U.S.C. section 360bbb-3(b)(1), unless the authorization is terminated or revoked.  Performed at [facility], [address] [place], [place] [address]      Signed: Terrilee Croak  [facility] 08/22/2020, 11:57 AM

## 2020-08-22 NOTE — TOC Initial Note (Signed)
Transition of Care [facility]) - Initial/Assessment Note    Patient Details  Name: Terri Perez MRN: [id number] Date of Birth: Aug 13, 1945  Transition of Care [facility]) CM/SW Contact:    Magnus Ivan, LCSW Phone Number: 08/22/2020, 9:11 AM  Clinical Narrative:         Spoke to patient. Patient came from [facility] short term rehab. Patient states she does not plan to return there, plans to return home with husband. Patient declines Home Health services. Patient stated she has a walker, shower chair, ramp and wheelchair at home. Patient had HH in the past. Husband provides transport. PCP is Dr. Rosanna Randy. [facility]. Patient agreed to reach out if she changes her mind about [place] or if additional needs arise prior to discharge.   Expected Discharge Plan: Home/Self Care Barriers to Discharge: Barriers Resolved   Patient Goals and CMS Choice Patient states their goals for this hospitalization and ongoing recovery are:: home with husband CMS Medicare.gov Compare Post Acute Care list provided to:: Patient Choice offered to / list presented to : Patient  Expected Discharge Plan and Services Expected Discharge Plan: Home/Self Care       Living arrangements for the past 2 months: Single Family Home                                      Prior Living Arrangements/Services Living arrangements for the past 2 months: Single Family Home Lives with:: Spouse Patient language and need for interpreter reviewed:: Yes Do you feel safe going back to the place where you live?: Yes      Need for Family Participation in Patient Care: Yes (Comment) Care giver support system in place?: Yes (comment) Current home services: DME Criminal Activity/Legal Involvement Pertinent to Current Situation/Hospitalization: No - Comment as needed  Activities of Daily Living Home Assistive Devices/Equipment: Walker (specify type) ADL Screening (condition at time of admission) Patient's  cognitive ability adequate to safely complete daily activities?: Yes Is the patient deaf or have difficulty hearing?: No Does the patient have difficulty seeing, even when wearing glasses/contacts?: No Does the patient have difficulty concentrating, remembering, or making decisions?: No Patient able to express need for assistance with ADLs?: Yes Does the patient have difficulty dressing or bathing?: Yes Independently performs ADLs?: No Communication: Independent Does the patient have difficulty walking or climbing stairs?: Yes Weakness of Legs: Both Weakness of Arms/Hands: None  Permission Sought/Granted Permission sought to share information with : Family Supports Permission granted to share information with : Yes, Verbal Permission Granted        Permission granted to share info w Relationship: husband     Emotional Assessment       Orientation: : Oriented to Self, Oriented to Place, Oriented to  Time, Oriented to Situation Alcohol / Substance Use: Not Applicable Psych Involvement: No (comment)  Admission diagnosis:  Syncope and collapse [R55] Syncope [R55] Patient Active Problem List   Diagnosis Date Noted   HTN (hypertension) 08/21/2020   HLD (hyperlipidemia) 08/21/2020   CKD (chronic kidney disease), stage IIIa 08/21/2020   Hypomagnesemia 08/21/2020   UTI (urinary tract infection) 07/30/2020   Generalized weakness 07/30/2020   B12 deficiency anemia 04/19/2020   Elevated troponin    Intractable vomiting with nausea    Hypokalemia 04/13/2020   Dehydration 04/13/2020   Syncope and collapse 99991111   Acute metabolic encephalopathy 99991111   AKI (acute kidney  injury) ([place]) 04/13/2020   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   Fracture of sacrum ([place]) 08/04/2016   Adiposity 05/05/2016   Parkinson disease ([place]) 04/25/2016   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   Bilateral lower extremity  edema 07/23/2015   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   Chronic kidney disease (CKD), stage III (moderate) (HCC) 05/11/2015   Elevated rheumatoid factor 05/11/2015   Primary osteoarthritis of both hips 05/11/2015   Syncope 04/24/2015   History of operative procedure on hip 04/24/2015   Restless leg 04/24/2015   Osteoporosis 03/30/2015   Insomnia 03/30/2015   Skin cancer 03/30/2015   MDD (major depressive disorder) 03/30/2015   Migraine 03/30/2015   GERD (gastroesophageal reflux disease) 03/30/2015   IBS (irritable bowel syndrome) 03/30/2015   OA (osteoarthritis) 03/30/2015   Skin lesion of chest wall 07/17/2014   Closed fracture of shaft of femur ([place]) 04/01/2014   Fracture of bone adjacent to prosthesis 04/01/2014   Breast cancer ([place]) 03/07/2013   Malignant neoplasm of upper-outer quadrant of female breast ([place])    Acute anxiety 06/11/2008   CONSTIPATION, CHRONIC 06/11/2008   Fibromyalgia 06/11/2008   PCP:  Jerrol Banana., MD Pharmacy:   [facility] [id number] - [address], [place] [address] Phone: [phone] Fax: [phone]     Social Determinants of Health (SDOH) Interventions    Readmission Risk Interventions No flowsheet data found.

## 2020-08-22 NOTE — Evaluation (Signed)
Physical Therapy Evaluation Patient Details Name: Terri Perez MRN: [id number] DOB: [birth year] Today's Date: 08/22/2020   History of Present Illness  75 y.o. female with medical history significant of Parkinson's disease, orthostatic hypotension dysautonomic syndrome, hypertension, hyperlipidemia, GERD, depression with anxiety, syncope, breast cancer (s/p of left lumpectomy, radiation and chemotherapy), CKD stage IIIa, fibromyalgia, IBS, who presents with syncope.   Apparently she has had multiple falls/recurring syncope that is increasing recently.  Clinical Impression  Pt not initially wanting to do a lot with PT but with some light encouragement she was willing to do some strength testing, ambulate in the room (25-30 ft) with walker.  She did not have any LOBs, dizziness/syncope, etc and generally did well despite needing consistent cuing and reinforcement to stay on task.  Pt reports she is eager to go home and with 24/7 assist should be able to safely do so per today's performance.      Follow Up Recommendations Home health PT;Supervision/Assistance - 24 hour    Equipment Recommendations  None recommended by PT    Recommendations for Other Services       Precautions / Restrictions Precautions Precautions: Fall Restrictions Weight Bearing Restrictions: No      Mobility  Bed Mobility Overal bed mobility: Needs Assistance Bed Mobility: Supine to Sit;Sit to Supine     Supine to sit: Min assist Sit to supine: Mod assist   General bed mobility comments: assist for BLE mgmt    Transfers Overall transfer level: Needs assistance Equipment used: Rolling walker (2 wheeled) Transfers: Sit to/from Stand Sit to Stand: From elevated surface;Min guard         General transfer comment: Pt was able to rise from both bed and recliner w/o physical assist.  She needed close CGA for safety awareness, etc but physically did quite well  Ambulation/Gait Ambulation/Gait assistance:  Min assist Gait Distance (Feet): 25 Feet Assistive device: Rolling walker (2 wheeled)       General Gait Details: Pt was able to ambulate in the room without direct assist, did need directional and general awareness cues but had no LOBs, excessive fatigue or other safety concerns.  Stairs            Wheelchair Mobility    Modified Rankin (Stroke Patients Only)       Balance Overall balance assessment: Needs assistance Sitting-balance support: No upper extremity supported;Feet supported Sitting balance-Leahy Scale: Good Sitting balance - Comments: maintains EOB sitting w/o assist or issue   Standing balance support: Bilateral upper extremity supported Standing balance-Leahy Scale: Fair Standing balance comment: BUE support on RW - poor awareness but no LOBs or overt safety issues                             Pertinent Vitals/Pain Pain Assessment: No/denies pain    Home Living Family/patient expects to be discharged to:: Private residence Living Arrangements: Spouse/significant other Available Help at Discharge: Available 24 hours/day Type of Home: House Home Access: Ramped entrance     Home Layout: One level Home Equipment: Walker - 2 wheels;Bedside commode;Shower seat;Wheelchair - manual;Hand held shower head;Hospital bed      Prior Function Level of Independence: Needs assistance   Gait / Transfers Assistance Needed: Pt reports ambulating with use of RW and assist from husband. Multiple falls in home.           Hand Dominance        Extremity/Trunk Assessment  Upper Extremity Assessment Upper Extremity Assessment: Overall WFL for tasks assessed    Lower Extremity Assessment Lower Extremity Assessment: Overall WFL for tasks assessed       Communication   Communication: No difficulties  Cognition Arousal/Alertness: Lethargic Behavior During Therapy: WFL for tasks assessed/performed Overall Cognitive Status: Difficult to assess                                  General Comments: Pt aware of month and setting, repeats comments consistently t/o the session.  Difficult to keep on tasks, but PT was able to redirect at times.      General Comments General comments (skin integrity, edema, etc.): Pt needing consistent cues to stay on task, but able to phyiscally do well with no lightheadedness, syncope, etc    Exercises     Assessment/Plan    PT Assessment Patient needs continued PT services  PT Problem List Decreased strength;Decreased range of motion;Decreased activity tolerance;Decreased balance;Decreased mobility;Decreased coordination;Decreased knowledge of use of DME;Decreased safety awareness;Decreased knowledge of precautions;Pain       PT Treatment Interventions DME instruction;Gait training;Functional mobility training;Therapeutic activities;Therapeutic exercise;Balance training;Cognitive remediation;Neuromuscular re-education;Patient/family education    PT Goals (Current goals can be found in the Care Plan section)  Acute Rehab PT Goals Patient Stated Goal: none stated PT Goal Formulation: With patient/family Time For Goal Achievement: 09/05/20 Potential to Achieve Goals: Fair    Frequency Min 2X/week   Barriers to discharge        Co-evaluation               AM-PAC PT "6 Clicks" Mobility  Outcome Measure Help needed turning from your back to your side while in a flat bed without using bedrails?: A Little Help needed moving from lying on your back to sitting on the side of a flat bed without using bedrails?: A Little Help needed moving to and from a bed to a chair (including a wheelchair)?: A Little Help needed standing up from a chair using your arms (e.g., wheelchair or bedside chair)?: A Little Help needed to walk in hospital room?: A Little Help needed climbing 3-5 steps with a railing? : A Lot 6 Click Score: 17    End of Session Equipment Utilized During Treatment:  Gait belt Activity Tolerance: Patient tolerated treatment well Patient left: with bed alarm set;with call bell/phone within reach Nurse Communication: Mobility status PT Visit Diagnosis: History of falling (Z91.81);Muscle weakness (generalized) (M62.81);Difficulty in walking, not elsewhere classified (R26.2)    Time: [id number] PT Time Calculation (min) (ACUTE ONLY): 29 min   Charges:   PT Evaluation $PT Eval Low Complexity: 1 Low PT Treatments $Gait Training: 8-22 mins        Kreg Shropshire, DPT 08/22/2020, 12:52 PM

## 2020-08-22 NOTE — Plan of Care (Signed)

## 2020-08-22 NOTE — ED Notes (Signed)
Pt. Resting in bed, eyes closed, chest rise and fall.

## 2020-09-15 ENCOUNTER — Encounter: Payer: Self-pay | Admitting: Family Medicine

## 2020-09-15 ENCOUNTER — Ambulatory Visit (INDEPENDENT_AMBULATORY_CARE_PROVIDER_SITE_OTHER): Payer: Medicare Other | Admitting: Family Medicine

## 2020-09-15 ENCOUNTER — Other Ambulatory Visit: Payer: Self-pay

## 2020-09-15 VITALS — BP 118/75 | HR 87 | Resp 16 | Ht 67.0 in | Wt 180.0 lb

## 2020-09-15 DIAGNOSIS — R42 Dizziness and giddiness: Secondary | ICD-10-CM | POA: Diagnosis not present

## 2020-09-15 DIAGNOSIS — G20 Parkinson's disease: Secondary | ICD-10-CM

## 2020-09-15 DIAGNOSIS — Z1329 Encounter for screening for other suspected endocrine disorder: Secondary | ICD-10-CM

## 2020-09-15 DIAGNOSIS — M158 Other polyosteoarthritis: Secondary | ICD-10-CM | POA: Diagnosis not present

## 2020-09-15 DIAGNOSIS — K219 Gastro-esophageal reflux disease without esophagitis: Secondary | ICD-10-CM | POA: Diagnosis not present

## 2020-09-15 DIAGNOSIS — F419 Anxiety disorder, unspecified: Secondary | ICD-10-CM

## 2020-09-15 DIAGNOSIS — I951 Orthostatic hypotension: Secondary | ICD-10-CM

## 2020-09-15 DIAGNOSIS — I700 Atherosclerosis of aorta: Secondary | ICD-10-CM | POA: Diagnosis not present

## 2020-09-15 MED ORDER — PANTOPRAZOLE SODIUM 40 MG PO TBEC: 40.0000 mg | DELAYED_RELEASE_TABLET | Freq: Every day | ORAL | 0 refills | Status: DC

## 2020-09-15 MED ORDER — PANTOPRAZOLE SODIUM 20 MG PO TBEC: 20.0000 mg | DELAYED_RELEASE_TABLET | Freq: Every day | ORAL | 1 refills | Status: DC

## 2020-09-15 MED ORDER — LIDOCAINE 5 % EX PTCH: 1.0000 | MEDICATED_PATCH | CUTANEOUS | 0 refills | Status: DC

## 2020-09-15 NOTE — Progress Notes (Signed)
I,[name],acting as a scribe for Terri Durie, MD.,have documented all relevant documentation on the behalf of Terri Durie, MD,as directed by  Terri Durie, MD while in the presence of Terri Durie, MD.   Established patient visit   Patient: Terri Perez   DOB: [birth year]   [name]  MRN: [id number] Visit Date: 09/15/2020  Today's healthcare provider: [name], MD   Chief Complaint  Patient presents with   Follow-up   Hyperlipidemia   Hypotension   Tremors   Subjective  --------------------------------------------------------------------------------------  HPI  Patient is back home after hospitalization and is staying in rehab.  She has days where she can move around on days where she cannot.  Her husband is her caregiver.  She does complain of chronic right leg pain which is been going on since her fractures years ago Follow up for orthostatic hypotension  The patient was last seen for this 3 months ago. Changes made at last visit include starting on Florinef 0.'1mg'$  once daily.  She reports good compliance with treatment. She feels that condition is Unchanged. She is not having side effects. none  --------------------------------------------------------------------------------------  Follow up for Parkinson's disease  The patient was last seen for this 3 months ago. Changes made at last visit include no medication changes. Stable condition. Has neurologist.   Lipid/Cholesterol, Follow-up  Last lipid panel Other pertinent labs  Lab Results  Component Value Date   CHOL 210 (H) 04/15/2020   HDL 76 04/15/2020   LDLCALC 111 (H) 04/15/2020   TRIG 115 04/15/2020   CHOLHDL 2.8 04/15/2020   Lab Results  Component Value Date   ALT 5 08/22/2020   AST 16 08/22/2020   PLT 225 08/21/2020   TSH 0.652 04/14/2020     She was last seen for this 3 months ago.  Management since that visit includes no medication  changes.  She reports good compliance with treatment. She is not having side effects. none   Current diet: well balanced Current exercise: none  The 10-year ASCVD risk score Mikey Bussing DC Jr., et al., 2013) is: 18.3%  --------------------------------------------------------------------------------------       Medications: Outpatient Medications Prior to Visit  Medication Sig   ALPRAZolam (XANAX) 0.5 MG tablet Take 1 tablet (0.5 mg total) by mouth 3 (three) times daily as needed.   carbidopa-levodopa (SINEMET IR) 25-100 MG tablet Take 2 tablets by mouth 3 (three) times daily.   fludrocortisone (FLORINEF) 0.1 MG tablet TAKE ONE TABLET BY MOUTH DAILY   ondansetron (ZOFRAN-ODT) 4 MG disintegrating tablet Take 1 tablet (4 mg total) by mouth every 8 (eight) hours as needed for nausea or vomiting.   traMADol (ULTRAM) 50 MG tablet Take 100 mg by mouth every 12 (twelve) hours as needed for moderate pain.   meclizine (ANTIVERT) 12.5 MG tablet Take 1 tablet (12.5 mg total) by mouth 3 (three) times daily. (Patient not taking: Reported on 09/15/2020)   metoprolol succinate (TOPROL-XL) 25 MG 24 hr tablet Take 0.5 tablets (12.5 mg total) by mouth at bedtime. (Patient not taking: Reported on 09/15/2020)   rOPINIRole (REQUIP) 1 MG tablet Take 4 tablets (4 mg total) by mouth at bedtime. (Patient not taking: Reported on 09/15/2020)   [DISCONTINUED] aspirin EC 81 MG EC tablet Take 1 tablet (81 mg total) by mouth daily. Swallow whole. (Patient not taking: Reported on 09/15/2020)   [DISCONTINUED] atorvastatin (LIPITOR) 20 MG tablet Take 1 tablet (20 mg total) by mouth daily. (Patient not  taking: Reported on 09/15/2020)   [DISCONTINUED] DULoxetine (CYMBALTA) 30 MG capsule Take 30 mg by mouth daily. (Patient not taking: Reported on 09/15/2020)   [DISCONTINUED] lidocaine (LIDODERM) 5 % Place 1 patch onto the skin daily. Remove & Discard patch within 12 hours or as directed by MD (Patient not taking: Reported on 09/15/2020)    [DISCONTINUED] omeprazole (PRILOSEC) 20 MG capsule Take 20 mg by mouth daily. (Patient not taking: Reported on 09/15/2020)   [DISCONTINUED] pantoprazole (PROTONIX) 40 MG tablet Take 1 tablet (40 mg total) by mouth daily.   [DISCONTINUED] vitamin B-12 (CYANOCOBALAMIN) 1000 MCG tablet Take 1 tablet (1,000 mcg total) by mouth daily. (Patient not taking: Reported on 09/15/2020)   No facility-administered medications prior to visit.    Review of Systems      Objective  -------------------------------------------------------------------------------------------------------------------- BP 118/75 (BP Location: Right Arm, Patient Position: Sitting, Cuff Size: Large)   Pulse 87   Resp 16   Ht '5\' 7"'$  (1.702 m)   Wt 180 lb (81.6 kg)   SpO2 93%   BMI 28.19 kg/m  BP Readings from Last 3 Encounters:  09/15/20 118/75  08/22/20 (!) 165/99  08/03/20 (!) 163/81   Wt Readings from Last 3 Encounters:  09/15/20 180 lb (81.6 kg)  08/22/20 188 lb 0.8 oz (85.3 kg)  07/30/20 186 lb 11.7 oz (84.7 kg)       Physical Exam Vitals reviewed.  Constitutional:      Appearance: She is well-developed.  HENT:     Head: Normocephalic and atraumatic.  Eyes:     General: No scleral icterus.    Conjunctiva/sclera: Conjunctivae normal.  Cardiovascular:     Rate and Rhythm: Normal rate and regular rhythm.     Heart sounds: Normal heart sounds.  Pulmonary:     Effort: Pulmonary effort is normal.     Breath sounds: Normal breath sounds.  Abdominal:     General: Bowel sounds are normal.     Palpations: Abdomen is soft.  Musculoskeletal:     Cervical back: Normal range of motion and neck supple.     Comments: +1 edema bilaterally also with lymphedema  Skin:    General: Skin is warm and dry.  Neurological:     Mental Status: She is alert and oriented to person, place, and time.     Comments: Masked facies consistent with parkinsonism  Psychiatric:        Mood and Affect: Mood normal.        Behavior:  Behavior normal.        Thought Content: Thought content normal.        Judgment: Judgment normal.      No results found for any visits on 09/15/20.  Assessment & Plan  ---------------------------------------------------------------------------------------------------------------------- 1. Acute /chronic anxiety Continue 3 times daily Xanax but will not increase. - TSH  2. Parkinson's disease ([place]) Followed by neurology - CBC w/Diff/Platelet - Renal function panel - TSH  3. Orthostatic hypotension dysautonomic syndrome Better with Florinef - CBC w/Diff/Platelet - Renal function panel - TSH  4. Dizziness Improved with Florinef.  Consider adding midodrine, follow for now - CBC w/Diff/Platelet - Renal function panel - TSH  5. Gastroesophageal reflux disease, unspecified whether esophagitis present On pantoprazole - CBC w/Diff/Platelet - Renal function panel - TSH - pantoprazole (PROTONIX) 20 MG tablet; Take 1 tablet (20 mg total) by mouth daily.  Dispense: 90 tablet; Refill: 1  6. Screening for thyroid disorder  - TSH  7. Other osteoarthritis involving multiple joints  -  lidocaine (LIDODERM) 5 %; Place 1 patch onto the skin daily. Remove & Discard patch within 12 hours or as directed by MD  Dispense: 30 patch; Refill: 0 8.  ASCVD of aorta ASCVD 10-year risk is 32.9%.  On atorvastatin 20 Return in about 4 months (around 01/15/2021).      I, Terri Durie, MD, have reviewed all documentation for this visit. The documentation on 09/21/20 for the exam, diagnosis, procedures, and orders are all accurate and complete.    Linton Stolp Cranford Mon, MD  [facility] [phone] (phone) [phone] (fax)  [place]

## 2020-09-16 LAB — CBC WITH DIFFERENTIAL/PLATELET
Basophils Absolute: 0 10*3/uL (ref 0.0–0.2)
Basos: 0 %
EOS (ABSOLUTE): 0.1 10*3/uL (ref 0.0–0.4)
Eos: 2 %
Hematocrit: 37.6 % (ref 34.0–46.6)
Hemoglobin: 12.2 g/dL (ref 11.1–15.9)
Immature Grans (Abs): 0 10*3/uL (ref 0.0–0.1)
Immature Granulocytes: 0 %
Lymphocytes Absolute: 1.5 10*3/uL (ref 0.7–3.1)
Lymphs: 21 %
MCH: 31 pg (ref 26.6–33.0)
MCHC: 32.4 g/dL (ref 31.5–35.7)
MCV: 96 fL (ref 79–97)
Monocytes Absolute: 0.6 10*3/uL (ref 0.1–0.9)
Monocytes: 9 %
Neutrophils Absolute: 4.9 10*3/uL (ref 1.4–7.0)
Neutrophils: 68 %
Platelets: 245 10*3/uL (ref 150–450)
RBC: 3.93 x10E6/uL (ref 3.77–5.28)
RDW: 12.1 % (ref 11.7–15.4)
WBC: 7.1 10*3/uL (ref 3.4–10.8)

## 2020-09-16 LAB — RENAL FUNCTION PANEL
Albumin: 4 g/dL (ref 3.7–4.7)
BUN/Creatinine Ratio: 16 (ref 12–28)
BUN: 14 mg/dL (ref 8–27)
CO2: 26 mmol/L (ref 20–29)
Calcium: 10.1 mg/dL (ref 8.7–10.3)
Chloride: 98 mmol/L (ref 96–106)
Creatinine, Ser: 0.88 mg/dL (ref 0.57–1.00)
Glucose: 92 mg/dL (ref 65–99)
Phosphorus: 2.6 mg/dL — ABNORMAL LOW (ref 3.0–4.3)
Potassium: 3.7 mmol/L (ref 3.5–5.2)
Sodium: 138 mmol/L (ref 134–144)
eGFR: 68 mL/min/{1.73_m2} (ref >59)

## 2020-09-16 LAB — TSH: TSH: 0.952 u[IU]/mL (ref 0.450–4.500)

## 2020-10-02 ENCOUNTER — Other Ambulatory Visit: Payer: Self-pay | Admitting: Family Medicine

## 2020-10-03 NOTE — Telephone Encounter (Signed)
Requested medications are due for refill today.  unknown  Requested medications are on the active medications list.  Yes - as a historical medication  Last refill. 07/30/2020  Future visit scheduled.   yes  Notes to clinic.  Medication not delegated.

## 2020-10-05 ENCOUNTER — Other Ambulatory Visit: Payer: Self-pay | Admitting: Family Medicine

## 2020-10-05 NOTE — Telephone Encounter (Signed)
Patients husband called isays wife is out of medication,traMADol (ULTRAM) 50 MG tablet , asking for short supply till refill comes in.

## 2020-10-05 NOTE — Telephone Encounter (Signed)
Copied from [place] [id number]. Topic: Quick Communication - Rx Refill/Question >> Oct 05, 2020  8:43 AM Tessa Lerner A wrote: Medication: traMADol (ULTRAM) 50 MG tablet [id number] - patient has 1 tablet remaining   Has the patient contacted their pharmacy? Yes.   (Agent: If no, request that the patient contact the pharmacy for the refill.) (Agent: If yes, when and what did the pharmacy advise?)  Preferred Pharmacy (with phone number or street name): [facility] [id number] [address], [place]  Phone:  [phone] Fax:  [phone]  Agent: Please be advised that RX refills may take up to 3 business days. We ask that you follow-up with your pharmacy.

## 2020-10-06 ENCOUNTER — Other Ambulatory Visit: Payer: Self-pay | Admitting: Family Medicine

## 2020-10-06 DIAGNOSIS — M158 Other polyosteoarthritis: Secondary | ICD-10-CM

## 2020-10-06 MED ORDER — TRAMADOL HCL 50 MG PO TABS: 100.0000 mg | ORAL_TABLET | Freq: Two times a day (BID) | ORAL | 1 refills | Status: DC | PRN

## 2020-10-06 NOTE — Telephone Encounter (Signed)
Requested medication (s) are due for refill today: yes  Requested medication (s) are on the active medication list: yes   Last refill: 11/04/19 #60  2 refills  Future visit scheduled yes 12/31/20  Notes to clinic: not delegated  Requested Prescriptions  Pending Prescriptions Disp Refills   ondansetron (ZOFRAN-ODT) 4 MG disintegrating tablet [Pharmacy Med Name: ONDANSETRON ODT 4 MG TABLET] 20 tablet     Sig: DISSOLVE ONE TABLET BY MOUTH EVERY 8 HOURS AS NEEDED FOR NAUSEA AND VOMITING     Not Delegated - Gastroenterology: Antiemetics Failed - 10/06/2020  4:39 PM      Failed - This refill cannot be delegated      Passed - Valid encounter within last 6 months    Recent Outpatient Visits           3 weeks ago [place] Jerrol Banana., MD   4 months ago Orthostatic hypotension dysautonomic syndrome   [facility] Jerrol Banana., MD   4 months ago Orthostatic hypotension dysautonomic syndrome   [facility] Jerrol Ban[facility]., MD   11 months ago Parkinson disease [facility])   [facility] Jerrol Ban[facility]., MD   1 year ago Parkinson disease [facility])   [facility] Jerrol Banana., MD       Future Appointments             In 2 months Jerrol Banana., MD [facility]            Signed Prescriptions Disp Refills   lidocaine (LIDODERM) 5 % 30 patch 0    Sig: APPLY ONE PATCH TOPICALLY DAILY. REMOVE AND DISCARD PATCH WITHIN 12 HOURS OR AS DIRECTED BY MD     Analgesics:  Topicals Passed - 10/06/2020  4:39 PM      Passed - Valid encounter within last 12 months    Recent Outpatient Visits           3 weeks ago [place] Jerrol Banana., MD   4 months ago Orthostatic hypotension dysautonomic syndrome   [facility] Jerrol Banana., MD   4 months ago Orthostatic hypotension dysautonomic  syndrome   [facility] Jerrol Ban[facility]., MD   11 months ago [facility] disease [facility])   [facility] Jerrol Banana., MD   1 year ago Parkinson disease [facility])   [facility] Jerrol Banana., MD       Future Appointments             In 2 months Jerrol Banana., MD [facility] [place]

## 2020-10-06 NOTE — Telephone Encounter (Signed)
Requested medication (s) are due for refill today: Amount not specified  Requested medication (s) are on the active medication list: Yes  Last refill: 07/30/20 Amount not specified  Future visit scheduled Yes 12/31/20  Notes to clinic: not delegated  Requested Prescriptions  Pending Prescriptions Disp Refills   traMADol (ULTRAM) 50 MG tablet 30 tablet     Sig: Take 2 tablets (100 mg total) by mouth every 12 (twelve) hours as needed for moderate pain.     Not Delegated - Analgesics:  Opioid Agonists Failed - 10/06/2020 12:07 PM      Failed - This refill cannot be delegated      Passed - Urine Drug Screen completed in last 360 days      Passed - Valid encounter within last 6 months    Recent Outpatient Visits           3 weeks ago [place] Jerrol Banana., MD   4 months ago Orthostatic hypotension dysautonomic syndrome   [facility] Jerrol Banana., MD   4 months ago Orthostatic hypotension dysautonomic syndrome   [facility] Jerrol Ban[facility]., MD   11 months ago Parkinson disease [facility])   [facility] MD   1 year ago Parkinson disease [facility])   [facility] Jerrol Banana., MD       Future Appointments             In 2 months Jerrol Banana., MD [facility] [place]

## 2020-10-09 NOTE — Telephone Encounter (Signed)
Pts husband is calling to check on the status of the Zofran script please advise cb- [phone]

## 2020-10-12 ENCOUNTER — Other Ambulatory Visit: Payer: Self-pay | Admitting: Family Medicine

## 2020-10-12 DIAGNOSIS — F419 Anxiety disorder, unspecified: Secondary | ICD-10-CM

## 2020-10-27 DIAGNOSIS — I951 Orthostatic hypotension: Secondary | ICD-10-CM | POA: Diagnosis not present

## 2020-10-27 DIAGNOSIS — G20 Parkinson's disease: Secondary | ICD-10-CM | POA: Diagnosis not present

## 2020-10-27 DIAGNOSIS — G2581 Restless legs syndrome: Secondary | ICD-10-CM | POA: Diagnosis not present

## 2020-11-03 ENCOUNTER — Telehealth: Payer: Self-pay

## 2020-11-03 NOTE — Telephone Encounter (Signed)
[name] Jori Moll came in the office and dropped off pt's jury summons. Jori Moll is requesting Dr. Rosanna Randy provider a letter to excuse pt from serving jury duty. Ronald request call back when letter is ready for pick-up. Jury Summons placed in Dr. Alben Spittle box. CB#[phone]. Please advise. Thanks TNP

## 2020-11-03 NOTE — Telephone Encounter (Signed)
Received letter. Placed in pcp's basket.

## 2020-11-05 ENCOUNTER — Encounter: Payer: Self-pay | Admitting: *Deleted

## 2020-11-05 NOTE — Telephone Encounter (Signed)
Letter is ready to pick up. Patient has been advised.

## 2020-11-16 ENCOUNTER — Other Ambulatory Visit: Payer: Self-pay | Admitting: Family Medicine

## 2020-12-01 ENCOUNTER — Other Ambulatory Visit: Payer: Self-pay | Admitting: Family Medicine

## 2020-12-02 NOTE — Telephone Encounter (Signed)
Requested medications are due for refill today.  yes  Requested medications are on the active medications list.  yes  Last refill. 10/06/2020 #100 with 1 refill  Future visit scheduled.   yes  Notes to clinic.  Medication not delegated.

## 2020-12-04 ENCOUNTER — Emergency Department: Payer: Medicare Other

## 2020-12-04 ENCOUNTER — Emergency Department
Admission: EM | Admit: 2020-12-04 | Discharge: 2020-12-04 | Disposition: A | Discharge status: Home / Self Care | Payer: Medicare Other | Attending: Emergency Medicine | Admitting: Emergency Medicine

## 2020-12-04 ENCOUNTER — Other Ambulatory Visit: Payer: Self-pay

## 2020-12-04 DIAGNOSIS — R0902 Hypoxemia: Secondary | ICD-10-CM | POA: Diagnosis not present

## 2020-12-04 DIAGNOSIS — I959 Hypotension, unspecified: Secondary | ICD-10-CM | POA: Diagnosis not present

## 2020-12-04 DIAGNOSIS — G20 Parkinson's disease: Secondary | ICD-10-CM | POA: Insufficient documentation

## 2020-12-04 DIAGNOSIS — R4182 Altered mental status, unspecified: Secondary | ICD-10-CM | POA: Insufficient documentation

## 2020-12-04 DIAGNOSIS — N1831 Chronic kidney disease, stage 3a: Secondary | ICD-10-CM | POA: Diagnosis not present

## 2020-12-04 DIAGNOSIS — I10 Essential (primary) hypertension: Secondary | ICD-10-CM | POA: Diagnosis not present

## 2020-12-04 DIAGNOSIS — Z853 Personal history of malignant neoplasm of breast: Secondary | ICD-10-CM | POA: Insufficient documentation

## 2020-12-04 DIAGNOSIS — Z79899 Other long term (current) drug therapy: Secondary | ICD-10-CM | POA: Diagnosis not present

## 2020-12-04 DIAGNOSIS — I129 Hypertensive chronic kidney disease with stage 1 through stage 4 chronic kidney disease, or unspecified chronic kidney disease: Secondary | ICD-10-CM | POA: Insufficient documentation

## 2020-12-04 DIAGNOSIS — Z85038 Personal history of other malignant neoplasm of large intestine: Secondary | ICD-10-CM | POA: Diagnosis not present

## 2020-12-04 DIAGNOSIS — R404 Transient alteration of awareness: Secondary | ICD-10-CM | POA: Diagnosis not present

## 2020-12-04 DIAGNOSIS — Z96643 Presence of artificial hip joint, bilateral: Secondary | ICD-10-CM | POA: Insufficient documentation

## 2020-12-04 LAB — CBC WITH DIFFERENTIAL/PLATELET
Abs Immature Granulocytes: 0.02 10*3/uL (ref 0.00–0.07)
Basophils Absolute: 0 10*3/uL (ref 0.0–0.1)
Basophils Relative: 0 %
Eosinophils Absolute: 0 10*3/uL (ref 0.0–0.5)
Eosinophils Relative: 1 %
HCT: 42.8 % (ref 36.0–46.0)
Hemoglobin: 13.8 g/dL (ref 12.0–15.0)
Immature Granulocytes: 0 %
Lymphocytes Relative: 21 %
Lymphs Abs: 1.5 10*3/uL (ref 0.7–4.0)
MCH: 32.3 pg (ref 26.0–34.0)
MCHC: 32.2 g/dL (ref 30.0–36.0)
MCV: 100.2 fL — ABNORMAL HIGH (ref 80.0–100.0)
Monocytes Absolute: 0.4 10*3/uL (ref 0.1–1.0)
Monocytes Relative: 6 %
Neutro Abs: 5.3 10*3/uL (ref 1.7–7.7)
Neutrophils Relative %: 72 %
Platelets: 224 10*3/uL (ref 150–400)
RBC: 4.27 MIL/uL (ref 3.87–5.11)
RDW: 13.4 % (ref 11.5–15.5)
WBC: 7.3 10*3/uL (ref 4.0–10.5)
nRBC: 0 % (ref 0.0–0.2)

## 2020-12-04 LAB — URINALYSIS, ROUTINE W REFLEX MICROSCOPIC
Bilirubin Urine: NEGATIVE
Glucose, UA: NEGATIVE mg/dL
Hgb urine dipstick: NEGATIVE
Ketones, ur: 5 mg/dL — AB
Nitrite: NEGATIVE
Protein, ur: 100 mg/dL — AB
Specific Gravity, Urine: 1.013 (ref 1.005–1.030)
pH: 7 (ref 5.0–8.0)

## 2020-12-04 LAB — COMPREHENSIVE METABOLIC PANEL
ALT: 5 U/L (ref 0–44)
AST: 27 U/L (ref 15–41)
Albumin: 3.5 g/dL (ref 3.5–5.0)
Alkaline Phosphatase: 60 U/L (ref 38–126)
Anion gap: 9 (ref 5–15)
BUN: 18 mg/dL (ref 8–23)
CO2: 29 mmol/L (ref 22–32)
Calcium: 9.7 mg/dL (ref 8.9–10.3)
Chloride: 99 mmol/L (ref 98–111)
Creatinine, Ser: 1.24 mg/dL — ABNORMAL HIGH (ref 0.44–1.00)
GFR, Estimated: 45 mL/min — ABNORMAL LOW (ref >60)
Glucose, Bld: 104 mg/dL — ABNORMAL HIGH (ref 70–99)
Potassium: 3.5 mmol/L (ref 3.5–5.1)
Sodium: 137 mmol/L (ref 135–145)
Total Bilirubin: 1.3 mg/dL — ABNORMAL HIGH (ref 0.3–1.2)
Total Protein: 7.1 g/dL (ref 6.5–8.1)

## 2020-12-04 LAB — TROPONIN I (HIGH SENSITIVITY): Troponin I (High Sensitivity): 11 ng/L (ref <18)

## 2020-12-04 MED ORDER — SODIUM CHLORIDE 0.9 % IV BOLUS
1000.0000 mL | Freq: Once | INTRAVENOUS | Status: AC
  Administered 2020-12-04: 1000 mL via INTRAVENOUS

## 2020-12-04 MED ORDER — ALPRAZOLAM 0.5 MG PO TABS
0.5000 mg | ORAL_TABLET | Freq: Once | ORAL | Status: AC
  Administered 2020-12-04: 0.5 mg via ORAL
  Filled 2020-12-04: qty 1

## 2020-12-04 NOTE — ED Triage Notes (Signed)
Pt to ED [facility] from home  Pt was sitting on toilet, unresponsive with head extended, for "1 hour" per husband. EMS states that husband said "this happens often" but this episode lasted longer.  EMS vs: CBG 152, HR 86, 125/99  EMS unsure if pt had eyes open or not on ride here. EMS states pt became "more responsive" when they arrived to hospital.  Pt has clear speech, answers questions. EDP at bedside. Pt has strong odor of urine.  Hx parkinsons and breast cancer per EMS. Unknown by EMS if hx dementia.  Pt denies pain. EDP questioning pt if she feels dizzy. Pt stating she feels dizzy but cannot describe further.

## 2020-12-04 NOTE — ED Notes (Signed)
Dr Cheri Fowler was just at bedside. Offered pt sips of water and orange juice. Pt took small sips of water and refused juice. Refused more water. Resting in bed with purewick in place. IV bolus started.

## 2020-12-04 NOTE — ED Provider Notes (Signed)
[facility] Emergency Department Provider Note   ____________________________________________   Event Date/Time   First MD Initiated Contact with Patient 12/04/20 [phone]     (approximate)  I have reviewed the triage vital signs and the nursing notes.   HISTORY  Chief Complaint Altered Mental Status    HPI Terri Perez is a 75 y.o. female who presents after husband found patient with altered mental status including being on the toilet with her head back and mouth open.  Husband states that patient has had similar episodes in the past however this 1 lasted longer than usual and patient had a prolonged return to her baseline that prompted the call to EMS.  Patient only states that she "does not understand why I am dizzy" but does not state that she is dizzy currently.  History and review of systems are unable to be obtained at this time given patient's mental status          Past Medical History:  Diagnosis Date   Arm fracture    right forearm, d/t fall   Bowel trouble 2010   Breast cancer ([place]) 12/2009   LEFT LUMPECTOMY   Cancer ([place]) 12/2009   Left UOQ breast tumor; wide excision,sn bx and whole breast radiation. Chemotherapy done. There was only a 32m area of residual tumor remaining. All sn were negative. This was ER-positive, PR-negative, HER-2 neu not over expressed tumor. The original ultrasound size was 2.6 cm(T2).   H/O cystitis 2011   Malignant neoplasm of upper-outer quadrant of female breast ([place] January 13, 2010   2+ centimeter tumor, neoadjuvant chemotherapy. On wide excision 3 mm area of residual tumor. Sentinel node negative. ER-30%, PR-negative, HER-2 not overexpressing.   [place]inson disease ([place] 04/2016   Dr SManuella Ghaziis pt s DR   Personal history of chemotherapy 2012   BREAST CA   Personal history of fibromyalgia 2002   Personal history of malignant neoplasm of large intestine    Personal history of radiation therapy 2012    BREAST CA   Special screening for malignant neoplasms, colon    Syncope    a. 03/2020 Echo: EF 50-55%, Gr1 DD, nl RV fxn, triv MR.   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   [place]ure of [place]rum ([place] 08/04/2016   Adiposity 05/05/2016   [place]inson disease ([place] 04/25/2016   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   Bilateral lower extremity edema 07/23/2015   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   Chronic kidney disease (CKD), stage III (moderate) (HCC) 05/11/2015   Elevated rheumatoid factor 05/11/2015   Primary osteoarthritis of both hips 05/11/2015   Syncope 04/24/2015   History of operative procedure on hip 04/24/2015   Restless leg 04/24/2015   Osteoporosis 03/30/2015   Insomnia 03/30/2015   Skin cancer 03/30/2015   MDD (major depressive disorder) 03/30/2015   Migraine 03/30/2015   GERD (gastroesophageal reflux  disease) 03/30/2015   IBS (irritable bowel syndrome) 03/30/2015   OA (osteoarthritis) 03/30/2015   Skin lesion of chest wall 07/17/2014   Closed fracture of shaft of femur ([place]) 04/01/2014   Fracture of bone adjacent to prosthesis 04/01/2014   Breast cancer ([place]) 03/07/2013   Malignant neoplasm of upper-outer quadrant of female breast ([place])    Acute anxiety 06/11/2008   CONSTIPATION, CHRONIC 06/11/2008   Fibromyalgia 06/11/2008     Past Surgical History:  Procedure Laterality Date   Brady   bone spurs between 5&6, used bone from left hip   BREAST BIOPSY Left 2011   POS   BREAST CYST ASPIRATION Left 1985   BREAST LUMPECTOMY Left 2011   BREAST SURGERY Left 2011   wide excision   CERVICAL DISCECTOMY     COLON RESECTION  Sept 2014   [facility]    COLONOSCOPY  2010   Dr. Allen Norris, normal   FEMUR FRACTURE SURGERY Right March 2016   JOINT REPLACEMENT Bilateral Jan 2016 and March 2016   hip   PORT-A-CATH REMOVAL  2013   PORTACATH PLACEMENT  2011   SKIN CANCER EXCISION  1980   face   TUBAL LIGATION  1973   UPPER GI ENDOSCOPY  08/20/07   gastritis without hemorrhage    Prior to Admission medications   Medication Sig Start Date End Date Taking? Authorizing Provider  ALPRAZolam Duanne Moron) 0.5 MG tablet TAKE ONE TABLET BY MOUTH EVERY 4 HOURS AS NEEDED 10/13/20   Jerrol Banana., MD  carbidopa-levodopa (SINEMET IR) 25-100 MG tablet Take 2 tablets by mouth 3 (three) times daily. 04/15/20   Sharen Hones, MD  fludrocortisone (FLORINEF) 0.1 MG tablet TAKE ONE TABLET BY MOUTH DAILY 07/30/20   Jerrol Banana., MD  lidocaine (LIDODERM) 5 % APPLY ONE PATCH TOPICALLY DAILY. REMOVE AND DISCARD PATCH WITHIN 12 HOURS OR AS DIRECTED BY MD 10/06/20   Jerrol Banana., MD  meclizine (ANTIVERT) 12.5 MG tablet Take 1 tablet (12.5 mg total) by mouth 3 (three) times daily. Patient not taking: Reported on 09/15/2020 08/03/20   Nolberto Hanlon, MD  metoprolol succinate (TOPROL-XL) 25 MG 24 hr tablet Take 0.5 tablets (12.5 mg total) by mouth at bedtime. Patient not taking: Reported on 09/15/2020 08/03/20   Nolberto Hanlon, MD  ondansetron (ZOFRAN-ODT) 4 MG disintegrating tablet DISSOLVE ONE TABLET BY MOUTH EVERY 8 HOURS AS NEEDED FOR NAUSEA AND VOMITING 11/16/20   Jerrol Banana., MD  pantoprazole (PROTONIX) 20 MG tablet Take 1 tablet (20 mg total) by mouth daily. 09/15/20   Jerrol Banana., MD  rOPINIRole (REQUIP) 1 MG tablet Take 4 tablets (4 mg total) by mouth at bedtime. Patient not taking: Reported on 09/15/2020 08/22/20 09/21/20  Terrilee Croak, MD  traMADol (ULTRAM) 50 MG tablet TAKE TWO TABLETS BY MOUTH EVERY 12 HOURS AS NEEDED FOR MODERATE PAIN 12/02/20   Jerrol Banana., MD    Allergies Alendronate, Gluten meal, Lactose intolerance (gi), Nsaids, Other, Oxycodone-acetaminophen, Oxycodone-acetaminophen, Vicodin [hydrocodone-acetaminophen], Wheat bran, Sulfa antibiotics, and Tape  Family History  Problem Relation Age of Onset   Cancer Brother        colon   Cancer Other        breast and ovarian cancers, relationships not listed   Stroke Mother    Osteoporosis Mother    Depression Sister    Depression Sister    Depression Sister    Cancer  Other 3       niece   Breast cancer Neg Hx     Social History Social History   Tobacco Use   Smoking status: Never   Smokeless tobacco: Never  Substance Use Topics   Alcohol use: No   Drug use: No    Review of Systems Unable to assess ____________________________________________   PHYSICAL EXAM:  VITAL SIGNS: ED Triage Vitals  Enc Vitals Group     BP      Pulse      Resp      Temp      Temp src      SpO2      Weight      Height      Head Circumference      Peak Flow      Pain Score      Pain Loc      Pain Edu?      Excl. in [place]?    Constitutional: Alert and disoriented. Well appearing and in no acute distress. Eyes: Conjunctivae are normal. PERRL. Head: Atraumatic. Nose: No congestion/rhinnorhea. Mouth/Throat: Mucous membranes are moist. Neck: No stridor Cardiovascular: Grossly normal heart sounds.  Good peripheral circulation. Respiratory: Normal respiratory effort.  No retractions. Gastrointestinal: Soft and nontender. No distention. Musculoskeletal: No obvious deformities Neurologic:  Normal speech and language. No gross focal neurologic deficits are appreciated. Skin:  Skin is  warm and dry. No rash noted. Psychiatric: Mood and affect are normal. Speech and behavior are normal.  ____________________________________________   LABS (all labs ordered are listed, but only abnormal results are displayed)  Labs Reviewed  URINALYSIS, ROUTINE W REFLEX MICROSCOPIC - Abnormal; Notable for the following components:      Result Value   Color, Urine YELLOW (*)    APPearance CLOUDY (*)    Ketones, ur 5 (*)    Protein, ur 100 (*)    Leukocytes,Ua TRACE (*)    Bacteria, UA RARE (*)    All other components within normal limits  COMPREHENSIVE METABOLIC PANEL - Abnormal; Notable for the following components:   Glucose, Bld 104 (*)    Creatinine, Ser 1.24 (*)    Total Bilirubin 1.3 (*)    GFR, Estimated 45 (*)    All other components within normal limits  CBC WITH DIFFERENTIAL/PLATELET - Abnormal; Notable for the following components:   MCV 100.2 (*)    All other components within normal limits  TROPONIN I (HIGH SENSITIVITY)   ____________________________________________  EKG  ED ECG REPORT I, Naaman Plummer, the attending physician, personally viewed and interpreted this ECG.  Date: 12/04/2020 EKG Time: 1000 Rate: 94 Rhythm: normal sinus rhythm QRS Axis: normal Intervals: normal ST/T Wave abnormalities: normal Narrative Interpretation: no evidence of acute ischemia  ____________________________________________  RADIOLOGY  ED MD interpretation: CT of the head without contrast shows no evidence of acute abnormalities including no intracerebral hemorrhage, obvious masses, or significant edema  Official radiology report(s): CT Head Wo Contrast  Result Date: 12/04/2020 CLINICAL DATA:  Altered mental status EXAM: CT HEAD WITHOUT CONTRAST TECHNIQUE: Contiguous axial images were obtained from the base of the skull through the vertex without intravenous contrast. COMPARISON:  08/21/2020 FINDINGS: Brain: No evidence of acute infarction, hemorrhage, hydrocephalus,  extra-axial collection or mass lesion/mass effect. Mild periventricular and deep white matter hypodensity. Vascular: No hyperdense vessel or unexpected calcification. Skull: Normal. Negative for fracture or focal lesion. Sinuses/Orbits: No acute finding. Other: None. IMPRESSION: No acute intracranial pathology. Small-vessel white matter disease. Electronically  Signed   By: Delanna Ahmadi M.D.   On: 12/04/2020 11:49    ____________________________________________   PROCEDURES  Procedure(s) performed (including Critical Care):  Procedures   ____________________________________________   INITIAL IMPRESSION / ASSESSMENT AND PLAN / ED COURSE  As part of my medical decision making, I reviewed the following data within the electronic medical record, if available:  Nursing notes reviewed and incorporated, Labs reviewed, EKG interpreted, Old chart reviewed, Radiograph reviewed and Notes from prior ED visits reviewed and incorporated        The patient suffered an episode of altered mental status, but there is no overt concern for a dangerous emergent cause such as, but not limited to, CNS infection, severe Toxidrome, severe metabolic derangement, or stroke.  Given History, Physical, and Workup the cause appears to be related to her Parkinson's  Disposition: Discharge. At the time of discharge, the patient is back to baseline mental status.      ____________________________________________   FINAL CLINICAL IMPRESSION(S) / ED DIAGNOSES  Final diagnoses:  Altered mental status, unspecified altered mental status type     ED Discharge Orders     None        Note:  This document was prepared using Dragon voice recognition software and may include unintentional dictation errors.    Naaman Plummer, MD 12/04/20 [phone]

## 2020-12-07 DIAGNOSIS — G20 Parkinson's disease: Secondary | ICD-10-CM | POA: Diagnosis not present

## 2020-12-07 DIAGNOSIS — F5101 Primary insomnia: Secondary | ICD-10-CM | POA: Diagnosis not present

## 2020-12-22 ENCOUNTER — Other Ambulatory Visit: Payer: Self-pay | Admitting: *Deleted

## 2020-12-22 DIAGNOSIS — G20 Parkinson's disease: Secondary | ICD-10-CM

## 2020-12-22 DIAGNOSIS — G20A1 Parkinson's disease without dyskinesia, without mention of fluctuations: Secondary | ICD-10-CM

## 2020-12-22 DIAGNOSIS — N1831 Chronic kidney disease, stage 3a: Secondary | ICD-10-CM

## 2020-12-22 DIAGNOSIS — I700 Atherosclerosis of aorta: Secondary | ICD-10-CM

## 2020-12-23 ENCOUNTER — Telehealth: Payer: Self-pay | Admitting: *Deleted

## 2020-12-23 NOTE — Chronic Care Management (AMB) (Signed)
  Chronic Care Management   Note  12/23/2020 Name: Terri Perez MRN: [id number] DOB: [birth year]  Terri Perez is a 75 y.o. year old female who is a primary care patient of Jerrol Banana., MD. I reached out to [name] by phone today in response to a referral sent by [name]'s PCP.  Terri Perez was given information about Chronic Care Management services today including:  CCM service includes personalized support from designated clinical staff supervised by her physician, including individualized plan of care and coordination with other care providers 24/7 contact phone numbers for assistance for urgent and routine care needs. Service will only be billed when office clinical staff spend 20 minutes or more in a month to coordinate care. Only one practitioner may furnish and bill the service in a calendar month. The patient may stop CCM services at any time (effective at the end of the month) by phone call to the office staff. The patient is responsible for co-pay (up to 20% after annual deductible is met) if co-pay is required by the individual health plan.   Patient agreed to services and verbal consent obtained.   Follow up plan: Telephone appointment with care management team member scheduled for: 12/29/2020  Julian Hy, [place] [facility]  Direct Dial: [phone]

## 2020-12-29 ENCOUNTER — Ambulatory Visit (INDEPENDENT_AMBULATORY_CARE_PROVIDER_SITE_OTHER): Payer: Medicare Other | Admitting: *Deleted

## 2020-12-29 DIAGNOSIS — N1831 Chronic kidney disease, stage 3a: Secondary | ICD-10-CM

## 2020-12-29 DIAGNOSIS — G20 Parkinson's disease: Secondary | ICD-10-CM

## 2020-12-29 DIAGNOSIS — I700 Atherosclerosis of aorta: Secondary | ICD-10-CM

## 2020-12-29 NOTE — Chronic Care Management (AMB) (Signed)
Chronic Care Management    Clinical Social Work Note  12/29/2020 Name: Terri Perez MRN: [id number] DOB: 1945-09-19  Terri Perez is a 75 y.o. year old female who is a primary care patient of Jerrol Banana., MD. The CCM team was consulted to assist the patient with chronic disease management and/or care coordination needs related to: [employer] .   Engaged with patient's spouse by phone  for initial visit in response to provider referral for social work chronic care management and care coordination services.   Patient's spouse confirmed that he is the primary caregiver for patient, however is interested in additional resources for in home support for patient. Patient's spouse discussed desire for patient to remain in the home for as long as possible and is not interested in out of home placement at this time. Patient's spouse confirmed having someone to sit with patient once a week for approximately 2 hours and his daughter comes by weekly to help clean and do laundry, however patient's spouse feels that patient will need more hands on help soon. Applying for Medicaid considered, however patient does not meet the eligibility requirements. The consideration of hiring an aid for additional support as an out of pocket expense discussed. Patient's spouse willing to consider this in the near future, resources provided.  Consent to Services:  The patient was given the following information about Chronic Care Management services today, agreed to services, and gave verbal consent: 1. CCM service includes personalized support from designated clinical staff supervised by the primary care provider, including individualized plan of care and coordination with other care providers 2. 24/7 contact phone numbers for assistance for urgent and routine care needs. 3. Service will only be billed when office clinical staff spend 20 minutes or more in a month to coordinate care. 4. Only one  practitioner may furnish and bill the service in a calendar month. 5.The patient may stop CCM services at any time (effective at the end of the month) by phone call to the office staff. 6. The patient will be responsible for cost sharing (co-pay) of up to 20% of the service fee (after annual deductible is met). Patient agreed to services and consent obtained.  Patient agreed to services and consent obtained.   Assessment: Review of patient past medical history, allergies, medications, and health status, including review of relevant consultants reports was performed today as part of a comprehensive evaluation and provision of chronic care management and care coordination services.     SDOH (Social Determinants of Health) assessments and interventions performed:    Advanced Directives Status: Not addressed in this encounter.  CCM Care Plan  Allergies  Allergen Reactions   Alendronate Nausea Only    Gi symptoms   Gluten Meal Nausea Only    Other reaction(s): NAUSEA Other reaction(s): NAUSEA Other reaction(s): NAUSEA   Lactose Intolerance (Gi) Diarrhea   Nsaids     GI upset. Other reaction(s): Other (See Comments) Pt unsure   Other Other (See Comments)    Other reaction(s): OTHER   Oxycodone-Acetaminophen    Oxycodone-Acetaminophen Other (See Comments)    Other reaction(s): NAUSEA   Vicodin [Hydrocodone-Acetaminophen] Nausea Only, Diarrhea and Nausea And Vomiting   Wheat Bran Other (See Comments)   Sulfa Antibiotics Rash    Rash from steri strips   Tape Rash    Rash from steri strips    Outpatient Encounter Medications as of 12/29/2020  Medication Sig   ALPRAZolam (XANAX) 0.5 MG tablet TAKE ONE  TABLET BY MOUTH EVERY 4 HOURS AS NEEDED   carbidopa-levodopa (SINEMET IR) 25-100 MG tablet Take 2 tablets by mouth 3 (three) times daily.   fludrocortisone (FLORINEF) 0.1 MG tablet TAKE ONE TABLET BY MOUTH DAILY   lidocaine (LIDODERM) 5 % APPLY ONE PATCH TOPICALLY DAILY. REMOVE AND  DISCARD PATCH WITHIN 12 HOURS OR AS DIRECTED BY MD   meclizine (ANTIVERT) 12.5 MG tablet Take 1 tablet (12.5 mg total) by mouth 3 (three) times daily. (Patient not taking: Reported on 09/15/2020)   metoprolol succinate (TOPROL-XL) 25 MG 24 hr tablet Take 0.5 tablets (12.5 mg total) by mouth at bedtime. (Patient not taking: Reported on 09/15/2020)   ondansetron (ZOFRAN-ODT) 4 MG disintegrating tablet DISSOLVE ONE TABLET BY MOUTH EVERY 8 HOURS AS NEEDED FOR NAUSEA AND VOMITING   pantoprazole (PROTONIX) 20 MG tablet Take 1 tablet (20 mg total) by mouth daily.   rOPINIRole (REQUIP) 1 MG tablet Take 4 tablets (4 mg total) by mouth at bedtime. (Patient not taking: Reported on 09/15/2020)   traMADol (ULTRAM) 50 MG tablet TAKE TWO TABLETS BY MOUTH EVERY 12 HOURS AS NEEDED FOR MODERATE PAIN   No facility-administered encounter medications on file as of 12/29/2020.    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   Fracture of sacrum ([place]) 08/04/2016   Adiposity 05/05/2016   Parkinson disease ([place]) 04/25/2016   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   Bilateral lower extremity edema 07/23/2015   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   Chronic kidney disease (CKD), stage III (moderate) (HCC) 05/11/2015   Elevated rheumatoid factor 05/11/2015   Primary osteoarthritis of both hips 05/11/2015   Syncope  04/24/2015   History of operative procedure on hip 04/24/2015   Restless leg 04/24/2015   Osteoporosis 03/30/2015   Insomnia 03/30/2015   Skin cancer 03/30/2015   MDD (major depressive disorder) 03/30/2015   Migraine 03/30/2015   GERD (gastroesophageal reflux disease) 03/30/2015   IBS (irritable bowel syndrome) 03/30/2015   OA (osteoarthritis) 03/30/2015   Skin lesion of chest wall 07/17/2014   Closed fracture of shaft of femur ([place]) 04/01/2014   Fracture of bone adjacent to prosthesis 04/01/2014   Breast cancer ([place]) 03/07/2013   Malignant neoplasm of upper-outer quadrant of female breast ([place])    Acute anxiety 06/11/2008   CONSTIPATION, CHRONIC 06/11/2008   Fibromyalgia 06/11/2008    Conditions to be addressed/monitored: HTN, HLD, and CKD Stage III, Parkinson's ; Limited social support, Inability to perform ADL's independently, Inability to perform IADL's independently, and Lacks knowledge of community resource: in home support  There are no care plans that you recently modified to display for this patient.    Follow Up Plan:  Patient's spouse will contact this social worker with any additional community resource needs       [place], [place] Worker  [facility] [phone]

## 2020-12-30 DIAGNOSIS — H2513 Age-related nuclear cataract, bilateral: Secondary | ICD-10-CM | POA: Diagnosis not present

## 2020-12-31 ENCOUNTER — Ambulatory Visit: Payer: Medicare Other | Admitting: Family Medicine

## 2021-01-05 ENCOUNTER — Other Ambulatory Visit: Payer: Self-pay | Admitting: Family Medicine

## 2021-01-05 DIAGNOSIS — M158 Other polyosteoarthritis: Secondary | ICD-10-CM

## 2021-01-05 NOTE — Telephone Encounter (Signed)
Requested Prescriptions  Pending Prescriptions Disp Refills   lidocaine (LIDODERM) 5 % [Pharmacy Med Name: LIDOCAINE 5% PATCH] 30 patch 0    Sig: APPLY ONE PATCH TOPICALLY DAILY. REMOVE AND DISCARD PATCH WITHIN 12 HOURS OR AS DIRECTED BY MD     Analgesics:  Topicals Passed - 01/05/2021 10:04 AM      Passed - Valid encounter within last 12 months    Recent Outpatient Visits          3 months ago [place] Jerrol Banana., MD   7 months ago Orthostatic hypotension dysautonomic syndrome   [facility] Jerrol Banana., MD   7 months ago Orthostatic hypotension dysautonomic syndrome   [facility] Jerrol Banana., MD   1 year ago Parkinson disease [facility])   [facility] Jerrol Banana., MD   1 year ago Parkinson disease [facility])   [facility] Jerrol Banana., MD      Future Appointments            In 2 months Jerrol Banana., MD [facility], [place]

## 2021-02-10 ENCOUNTER — Emergency Department: Payer: Medicare Other

## 2021-02-10 ENCOUNTER — Encounter: Payer: Self-pay | Admitting: Emergency Medicine

## 2021-02-10 ENCOUNTER — Inpatient Hospital Stay
Admission: EM | Admit: 2021-02-10 | Discharge: 2021-02-19 | DRG: 871 | Disposition: A | Discharge status: Skilled Nursing Facility | Payer: Medicare Other | Attending: Internal Medicine | Admitting: Internal Medicine

## 2021-02-10 ENCOUNTER — Other Ambulatory Visit: Payer: Self-pay

## 2021-02-10 DIAGNOSIS — L89301 Pressure ulcer of unspecified buttock, stage 1: Secondary | ICD-10-CM | POA: Diagnosis present

## 2021-02-10 DIAGNOSIS — Z800 Family history of malignant neoplasm of digestive organs: Secondary | ICD-10-CM

## 2021-02-10 DIAGNOSIS — Z923 Personal history of irradiation: Secondary | ICD-10-CM

## 2021-02-10 DIAGNOSIS — G4700 Insomnia, unspecified: Secondary | ICD-10-CM | POA: Diagnosis present

## 2021-02-10 DIAGNOSIS — I129 Hypertensive chronic kidney disease with stage 1 through stage 4 chronic kidney disease, or unspecified chronic kidney disease: Secondary | ICD-10-CM | POA: Diagnosis present

## 2021-02-10 DIAGNOSIS — N3000 Acute cystitis without hematuria: Secondary | ICD-10-CM | POA: Diagnosis present

## 2021-02-10 DIAGNOSIS — N200 Calculus of kidney: Secondary | ICD-10-CM | POA: Diagnosis present

## 2021-02-10 DIAGNOSIS — F0284 Dementia in other diseases classified elsewhere, unspecified severity, with anxiety: Secondary | ICD-10-CM | POA: Diagnosis present

## 2021-02-10 DIAGNOSIS — Z20822 Contact with and (suspected) exposure to covid-19: Secondary | ICD-10-CM | POA: Diagnosis present

## 2021-02-10 DIAGNOSIS — Z85038 Personal history of other malignant neoplasm of large intestine: Secondary | ICD-10-CM

## 2021-02-10 DIAGNOSIS — F02818 Dementia in other diseases classified elsewhere, unspecified severity, with other behavioral disturbance: Secondary | ICD-10-CM | POA: Diagnosis present

## 2021-02-10 DIAGNOSIS — G20 Parkinson's disease: Secondary | ICD-10-CM | POA: Diagnosis present

## 2021-02-10 DIAGNOSIS — M069 Rheumatoid arthritis, unspecified: Secondary | ICD-10-CM | POA: Diagnosis present

## 2021-02-10 DIAGNOSIS — I10 Essential (primary) hypertension: Secondary | ICD-10-CM | POA: Diagnosis present

## 2021-02-10 DIAGNOSIS — K219 Gastro-esophageal reflux disease without esophagitis: Secondary | ICD-10-CM | POA: Diagnosis present

## 2021-02-10 DIAGNOSIS — N390 Urinary tract infection, site not specified: Secondary | ICD-10-CM | POA: Diagnosis not present

## 2021-02-10 DIAGNOSIS — Z888 Allergy status to other drugs, medicaments and biological substances status: Secondary | ICD-10-CM

## 2021-02-10 DIAGNOSIS — Z9221 Personal history of antineoplastic chemotherapy: Secondary | ICD-10-CM

## 2021-02-10 DIAGNOSIS — N3090 Cystitis, unspecified without hematuria: Secondary | ICD-10-CM

## 2021-02-10 DIAGNOSIS — I951 Orthostatic hypotension: Secondary | ICD-10-CM | POA: Diagnosis present

## 2021-02-10 DIAGNOSIS — A419 Sepsis, unspecified organism: Principal | ICD-10-CM | POA: Diagnosis present

## 2021-02-10 DIAGNOSIS — Z91018 Allergy to other foods: Secondary | ICD-10-CM

## 2021-02-10 DIAGNOSIS — E876 Hypokalemia: Secondary | ICD-10-CM | POA: Diagnosis present

## 2021-02-10 DIAGNOSIS — N179 Acute kidney failure, unspecified: Secondary | ICD-10-CM | POA: Diagnosis present

## 2021-02-10 DIAGNOSIS — N183 Chronic kidney disease, stage 3 unspecified: Secondary | ICD-10-CM | POA: Diagnosis not present

## 2021-02-10 DIAGNOSIS — M797 Fibromyalgia: Secondary | ICD-10-CM | POA: Diagnosis present

## 2021-02-10 DIAGNOSIS — N3001 Acute cystitis with hematuria: Secondary | ICD-10-CM | POA: Diagnosis not present

## 2021-02-10 DIAGNOSIS — G894 Chronic pain syndrome: Secondary | ICD-10-CM | POA: Diagnosis present

## 2021-02-10 DIAGNOSIS — Z885 Allergy status to narcotic agent status: Secondary | ICD-10-CM

## 2021-02-10 DIAGNOSIS — Z9109 Other allergy status, other than to drugs and biological substances: Secondary | ICD-10-CM

## 2021-02-10 DIAGNOSIS — Z66 Do not resuscitate: Secondary | ICD-10-CM | POA: Diagnosis present

## 2021-02-10 DIAGNOSIS — E872 Acidosis, unspecified: Secondary | ICD-10-CM | POA: Diagnosis present

## 2021-02-10 DIAGNOSIS — Z90710 Acquired absence of both cervix and uterus: Secondary | ICD-10-CM

## 2021-02-10 DIAGNOSIS — G9341 Metabolic encephalopathy: Secondary | ICD-10-CM | POA: Diagnosis present

## 2021-02-10 DIAGNOSIS — L8990 Pressure ulcer of unspecified site, unspecified stage: Secondary | ICD-10-CM | POA: Insufficient documentation

## 2021-02-10 DIAGNOSIS — Z886 Allergy status to analgesic agent status: Secondary | ICD-10-CM

## 2021-02-10 DIAGNOSIS — E860 Dehydration: Secondary | ICD-10-CM | POA: Diagnosis present

## 2021-02-10 DIAGNOSIS — R4182 Altered mental status, unspecified: Secondary | ICD-10-CM | POA: Diagnosis present

## 2021-02-10 DIAGNOSIS — Z853 Personal history of malignant neoplasm of breast: Secondary | ICD-10-CM

## 2021-02-10 DIAGNOSIS — Z85828 Personal history of other malignant neoplasm of skin: Secondary | ICD-10-CM

## 2021-02-10 DIAGNOSIS — G20A1 Parkinson's disease without dyskinesia, without mention of fluctuations: Secondary | ICD-10-CM

## 2021-02-10 DIAGNOSIS — F419 Anxiety disorder, unspecified: Secondary | ICD-10-CM

## 2021-02-10 DIAGNOSIS — E739 Lactose intolerance, unspecified: Secondary | ICD-10-CM | POA: Diagnosis present

## 2021-02-10 DIAGNOSIS — N1832 Chronic kidney disease, stage 3b: Secondary | ICD-10-CM | POA: Diagnosis present

## 2021-02-10 DIAGNOSIS — Z7189 Other specified counseling: Secondary | ICD-10-CM | POA: Diagnosis not present

## 2021-02-10 DIAGNOSIS — Z882 Allergy status to sulfonamides status: Secondary | ICD-10-CM

## 2021-02-10 DIAGNOSIS — Z79899 Other long term (current) drug therapy: Secondary | ICD-10-CM

## 2021-02-10 LAB — URINALYSIS, COMPLETE (UACMP) WITH MICROSCOPIC
Bacteria, UA: NONE SEEN
Bilirubin Urine: NEGATIVE
Glucose, UA: NEGATIVE mg/dL
Ketones, ur: 5 mg/dL — AB
Nitrite: NEGATIVE
Protein, ur: 30 mg/dL — AB
Specific Gravity, Urine: 1.012 (ref 1.005–1.030)
WBC, UA: >50 WBC/hpf — ABNORMAL HIGH (ref 0–5)
pH: 6 (ref 5.0–8.0)

## 2021-02-10 LAB — CBC WITH DIFFERENTIAL/PLATELET
Abs Immature Granulocytes: 0.03 10*3/uL (ref 0.00–0.07)
Basophils Absolute: 0 10*3/uL (ref 0.0–0.1)
Basophils Relative: 0 %
Eosinophils Absolute: 0 10*3/uL (ref 0.0–0.5)
Eosinophils Relative: 0 %
HCT: 42.5 % (ref 36.0–46.0)
Hemoglobin: 14 g/dL (ref 12.0–15.0)
Immature Granulocytes: 0 %
Lymphocytes Relative: 13 %
Lymphs Abs: 1.4 10*3/uL (ref 0.7–4.0)
MCH: 32.2 pg (ref 26.0–34.0)
MCHC: 32.9 g/dL (ref 30.0–36.0)
MCV: 97.7 fL (ref 80.0–100.0)
Monocytes Absolute: 0.5 10*3/uL (ref 0.1–1.0)
Monocytes Relative: 5 %
Neutro Abs: 8.5 10*3/uL — ABNORMAL HIGH (ref 1.7–7.7)
Neutrophils Relative %: 82 %
Platelets: 228 10*3/uL (ref 150–400)
RBC: 4.35 MIL/uL (ref 3.87–5.11)
RDW: 13.5 % (ref 11.5–15.5)
WBC: 10.5 10*3/uL (ref 4.0–10.5)
nRBC: 0 % (ref 0.0–0.2)

## 2021-02-10 LAB — COMPREHENSIVE METABOLIC PANEL
ALT: 14 U/L (ref 0–44)
AST: 47 U/L — ABNORMAL HIGH (ref 15–41)
Albumin: 4 g/dL (ref 3.5–5.0)
Alkaline Phosphatase: 86 U/L (ref 38–126)
Anion gap: 9 (ref 5–15)
BUN: 24 mg/dL — ABNORMAL HIGH (ref 8–23)
CO2: 29 mmol/L (ref 22–32)
Calcium: 9.7 mg/dL (ref 8.9–10.3)
Chloride: 95 mmol/L — ABNORMAL LOW (ref 98–111)
Creatinine, Ser: 1.62 mg/dL — ABNORMAL HIGH (ref 0.44–1.00)
GFR, Estimated: 33 mL/min — ABNORMAL LOW (ref >60)
Glucose, Bld: 116 mg/dL — ABNORMAL HIGH (ref 70–99)
Potassium: 2.9 mmol/L — ABNORMAL LOW (ref 3.5–5.1)
Sodium: 133 mmol/L — ABNORMAL LOW (ref 135–145)
Total Bilirubin: 0.8 mg/dL (ref 0.3–1.2)
Total Protein: 7.9 g/dL (ref 6.5–8.1)

## 2021-02-10 LAB — RESP PANEL BY RT-PCR (FLU A&B, COVID) ARPGX2
Influenza A by PCR: NEGATIVE
Influenza B by PCR: NEGATIVE
SARS Coronavirus 2 by RT PCR: NEGATIVE

## 2021-02-10 LAB — MAGNESIUM: Magnesium: 1.9 mg/dL (ref 1.7–2.4)

## 2021-02-10 LAB — LACTIC ACID, PLASMA
Lactic Acid, Venous: 2.6 mmol/L (ref 0.5–1.9)
Lactic Acid, Venous: 2.6 mmol/L (ref 0.5–1.9)
Lactic Acid, Venous: 1.9 mmol/L (ref 0.5–1.9)
Lactic Acid, Venous: 1.8 mmol/L (ref 0.5–1.9)

## 2021-02-10 LAB — PROTIME-INR
INR: 1 (ref 0.8–1.2)
Prothrombin Time: 13.2 seconds (ref 11.4–15.2)

## 2021-02-10 LAB — APTT: aPTT: 25 seconds (ref 24–36)

## 2021-02-10 MED ORDER — ONDANSETRON HCL 4 MG PO TABS
4.0000 mg | ORAL_TABLET | Freq: Four times a day (QID) | ORAL | Status: DC | PRN
  Administered 2021-02-12 – 2021-02-19 (×4): 4 mg via ORAL
  Filled 2021-02-10 (×4): qty 1

## 2021-02-10 MED ORDER — METOPROLOL TARTRATE 25 MG PO TABS
12.5000 mg | ORAL_TABLET | Freq: Once | ORAL | Status: AC
  Administered 2021-02-10: 12.5 mg via ORAL
  Filled 2021-02-10: qty 1

## 2021-02-10 MED ORDER — CARBIDOPA-LEVODOPA 25-100 MG PO TABS
2.0000 | ORAL_TABLET | Freq: Every day | ORAL | Status: DC
  Administered 2021-02-10 – 2021-02-19 (×45): 2 via ORAL
  Filled 2021-02-10 (×48): qty 2

## 2021-02-10 MED ORDER — PANTOPRAZOLE SODIUM 20 MG PO TBEC
20.0000 mg | DELAYED_RELEASE_TABLET | Freq: Every day | ORAL | Status: DC
  Administered 2021-02-10 – 2021-02-19 (×10): 20 mg via ORAL
  Filled 2021-02-10 (×11): qty 1

## 2021-02-10 MED ORDER — SODIUM CHLORIDE 0.9% FLUSH: 3.0000 mL | INTRAVENOUS | Status: DC | PRN

## 2021-02-10 MED ORDER — LIDOCAINE 5 % EX PTCH
1.0000 | MEDICATED_PATCH | CUTANEOUS | Status: DC
  Administered 2021-02-10 – 2021-02-18 (×9): 1 via TRANSDERMAL
  Filled 2021-02-10 (×10): qty 1

## 2021-02-10 MED ORDER — POTASSIUM CHLORIDE CRYS ER 20 MEQ PO TBCR
40.0000 meq | EXTENDED_RELEASE_TABLET | ORAL | Status: AC
  Administered 2021-02-10 (×2): 40 meq via ORAL
  Filled 2021-02-10 (×2): qty 2

## 2021-02-10 MED ORDER — SODIUM CHLORIDE 0.9% FLUSH
3.0000 mL | Freq: Two times a day (BID) | INTRAVENOUS | Status: DC
  Administered 2021-02-10 – 2021-02-11 (×2): 3 mL via INTRAVENOUS

## 2021-02-10 MED ORDER — ALPRAZOLAM 0.5 MG PO TABS
0.5000 mg | ORAL_TABLET | ORAL | Status: DC | PRN
  Administered 2021-02-10 – 2021-02-11 (×3): 0.5 mg via ORAL
  Filled 2021-02-10 (×3): qty 1

## 2021-02-10 MED ORDER — SODIUM CHLORIDE 0.9 % IV SOLN
1.0000 g | INTRAVENOUS | Status: DC
  Administered 2021-02-11 – 2021-02-12 (×3): 1 g via INTRAVENOUS
  Filled 2021-02-10: qty 1
  Filled 2021-02-10: qty 10
  Filled 2021-02-10 (×2): qty 1

## 2021-02-10 MED ORDER — METRONIDAZOLE 500 MG/100ML IV SOLN
500.0000 mg | Freq: Once | INTRAVENOUS | Status: AC
  Administered 2021-02-10: 500 mg via INTRAVENOUS
  Filled 2021-02-10: qty 100

## 2021-02-10 MED ORDER — FLUDROCORTISONE ACETATE 0.1 MG PO TABS
100.0000 ug | ORAL_TABLET | Freq: Every day | ORAL | Status: DC
  Administered 2021-02-10 – 2021-02-17 (×8): 100 ug via ORAL
  Filled 2021-02-10 (×9): qty 1

## 2021-02-10 MED ORDER — SODIUM CHLORIDE 0.9 % IV BOLUS (SEPSIS)
1000.0000 mL | Freq: Once | INTRAVENOUS | Status: AC
  Administered 2021-02-10: 1000 mL via INTRAVENOUS

## 2021-02-10 MED ORDER — LACTATED RINGERS IV SOLN: INTRAVENOUS | Status: DC

## 2021-02-10 MED ORDER — SODIUM CHLORIDE 0.9 % IV SOLN
2.0000 g | Freq: Once | INTRAVENOUS | Status: DC
  Filled 2021-02-10: qty 20

## 2021-02-10 MED ORDER — TRAMADOL HCL 50 MG PO TABS
50.0000 mg | ORAL_TABLET | Freq: Two times a day (BID) | ORAL | Status: DC | PRN
  Administered 2021-02-10 – 2021-02-18 (×7): 50 mg via ORAL
  Filled 2021-02-10 (×7): qty 1

## 2021-02-10 MED ORDER — SODIUM CHLORIDE 0.9 % IV SOLN
2.0000 g | Freq: Once | INTRAVENOUS | Status: AC
  Administered 2021-02-10: 2 g via INTRAVENOUS
  Filled 2021-02-10: qty 2

## 2021-02-10 MED ORDER — ONDANSETRON HCL 4 MG/2ML IJ SOLN
4.0000 mg | Freq: Four times a day (QID) | INTRAMUSCULAR | Status: DC | PRN
  Administered 2021-02-11 (×2): 4 mg via INTRAVENOUS
  Filled 2021-02-10 (×2): qty 2

## 2021-02-10 MED ORDER — SODIUM CHLORIDE 0.9 % IV SOLN: 250.0000 mL | INTRAVENOUS | Status: DC | PRN

## 2021-02-10 MED ORDER — ENOXAPARIN SODIUM 40 MG/0.4ML IJ SOSY
40.0000 mg | PREFILLED_SYRINGE | INTRAMUSCULAR | Status: DC
  Administered 2021-02-10 – 2021-02-18 (×9): 40 mg via SUBCUTANEOUS
  Filled 2021-02-10 (×9): qty 0.4

## 2021-02-10 NOTE — H&P (Signed)
History and Physical    Terri Perez QIW:[id number] DOB: 03/05/1945 DOA: 02/10/2021  PCP: Jerrol Banana., MD   Patient coming from: Home  I have personally briefly reviewed patient's old medical records in [place] Most of the history was obtained from patient's husband who was at the bedside.  Chief Complaint: Change in mental status  HPI: Terri Perez is a 76 y.o. female with medical history significant for Parkinson's disease, history of rheumatoid arthritis, fibromyalgia who presents to the ER via EMS for evaluation of decreased responsiveness per her husband. Patient's husband states that over the last 24 hours she has become increasingly lethargic and poorly responsive.  He states that she fell asleep sitting on the commode.  She usually does this but is able to awaken and come back to her baseline mental status.  He states that she was very lethargic and he was not able to get her up and so he called his son-in-law who was able to assist him in getting the patient back into her lift chair.  This morning he was unable to arouse her which is unusual and he notes that she had a lot of tremors in her extremities which is unusual and so he called EMS. He thinks she may have had a fever and states that EMS mention she may have had a temp of 102 even though it is undocumented. She has not been able to take any of her medications since 1 day prior to her admission due to mental status changes. I am unable to do review of systems on this patient. Sodium 133, potassium 2.9, chloride 95, bicarb 29, glucose 116, BUN 24, creatinine 1.62 above a baseline of 1.24, calcium 9.7, magnesium 1.9, alkaline phosphatase 86, albumin 4.0, AST 47, ALT 14, total protein 7.9, white count 10.5, hemoglobin 14.0, hematocrit 42.5, MCV 97.7, RDW 13.5, platelet count 228 PT 13.2, INR 1.0 Respiratory viral panel is negative Urinalysis shows pyuria Renal stone CT shows bilateral nonobstructing  renal stones.  Status post subtotal colectomy. Twelve-lead EKG reviewed by me shows sinus tachycardia with right axis deviation  ED Course: Patient is a 76 year old female who was brought into the ER by EMS for evaluation of change in mental status. She is noted to be tachycardic, has a normal white count with a left shift, has pyuria as well as lactic acidosis. She also has hypokalemia She received 1 L IV fluid bolus in the ER as well as empiric antibiotic therapy with cefepime and Flagyl. She will be admitted to the hospital for further evaluation.    Review of Systems: As per HPI otherwise all other systems reviewed and negative.    Past Medical History:  Diagnosis Date   Arm fracture    right forearm, d/t fall   Bowel trouble 2010   Breast cancer ([place]) 12/2009   LEFT LUMPECTOMY   Cancer ([place]) 12/2009   Left UOQ breast tumor; wide excision,sn bx and whole breast radiation. Chemotherapy done. There was only a 71m area of residual tumor remaining. All sn were negative. This was ER-positive, PR-negative, HER-2 neu not over expressed tumor. The original ultrasound size was 2.6 cm(T2).   H/O cystitis 2011   Malignant ne[place] of upper-outer quadrant of [place] 13, 2010   2+ centimeter tumor, neoadjuvant chemotherapy. On wide excision 3 mm area of residual tumor. Sentinel node negative. ER-30%, PR-negative, HER-2 not overexpressing.   [place]inson disease [place] 04/[place]   Dr SManuella Ghaziis pt s  DR   Personal history of chemotherapy 2012   BREAST CA   Personal history of fibromyalgia 2002   Personal history of malignant neoplasm of large intestine    Personal history of radiation therapy 2012   BREAST CA   Special screening for malignant neoplasms, colon    Syncope    a. 03/2020 Echo: EF 50-55%, Gr1 DD, nl RV fxn, triv MR.   Unspecified constipation     Past Surgical History:  Procedure Laterality Date   ABDOMINAL HYSTERECTOMY  1974   BACK SURGERY  1985   bone spurs  between 5&6, used bone from left hip   BREAST BIOPSY Left 2011   POS   BREAST CYST ASPIRATION Left 1985   BREAST LUMPECTOMY Left 2011   BREAST SURGERY Left 2011   wide excision   CERVICAL DISCECTOMY     COLON RESECTION  Sept 2014   [facility]    COLONOSCOPY  2010   Dr. Allen Norris, normal   FEMUR FRACTURE SURGERY Right March 2016   JOINT REPLACEMENT Bilateral Jan 2016 and March 2016   hip   PORT-A-CATH REMOVAL  2013   PORTACATH PLACEMENT  2011   SKIN CANCER EXCISION  1980   face   TUBAL LIGATION  1973   UPPER GI ENDOSCOPY  08/20/07   gastritis without hemorrhage     reports that she has never smoked. She has never used smokeless tobacco. She reports that she does not drink alcohol and does not use drugs.  Allergies  Allergen Reactions   Alendronate Nausea Only    Gi symptoms   Gluten Meal Nausea Only    Other reaction(s): NAUSEA Other reaction(s): NAUSEA Other reaction(s): NAUSEA   Lactose Intolerance (Gi) Diarrhea   Nsaids     GI upset. Other reaction(s): Other (See Comments) Pt unsure   Other Other (See Comments)    Other reaction(s): OTHER   Oxycodone-Acetaminophen    Oxycodone-Acetaminophen Other (See Comments)    Other reaction(s): NAUSEA   Vicodin [Hydrocodone-Acetaminophen] Nausea Only, Diarrhea and Nausea And Vomiting   Wheat Bran Other (See Comments)   Sulfa Antibiotics Rash    Rash from steri strips   Tape Rash    Rash from steri strips    Family History  Problem Relation Age of Onset   Cancer Brother        colon   Cancer Other        breast and ovarian cancers, relationships not listed   Stroke Mother    Osteoporosis Mother    Depression Sister    Depression Sister    Depression Sister    Cancer Other 43       niece   Breast cancer Neg Hx       Prior to Admission medications   Medication Sig Start Date End Date Taking? Authorizing Provider  ALPRAZolam Duanne Moron) 0.5 MG tablet TAKE ONE TABLET BY MOUTH EVERY 4 HOURS AS NEEDED 10/13/20  Yes Jerrol Banana., MD  carbidopa-levodopa (SINEMET IR) 25-100 MG tablet Take 2 tablets by mouth 3 (three) times daily. 04/15/20  Yes Sharen Hones, MD  fludrocortisone (FLORINEF) 0.1 MG tablet TAKE ONE TABLET BY MOUTH DAILY 07/30/20  Yes Jerrol Banana., MD  ondansetron (ZOFRAN-ODT) 4 MG disintegrating tablet DISSOLVE ONE TABLET BY MOUTH EVERY 8 HOURS AS NEEDED FOR NAUSEA AND VOMITING 11/16/20  Yes Jerrol Banana., MD  pantoprazole (PROTONIX) 20 MG tablet Take 1 tablet (20 mg total) by mouth daily. 09/15/20  Yes Jerrol Banana., MD  traMADol (ULTRAM) 50 MG tablet TAKE TWO TABLETS BY MOUTH EVERY 12 HOURS AS NEEDED FOR MODERATE PAIN 12/02/20  Yes Jerrol Banana., MD  lidocaine (LIDODERM) 5 % APPLY ONE PATCH TOPICALLY DAILY. REMOVE AND DISCARD PATCH WITHIN 12 HOURS OR AS DIRECTED BY MD Patient not taking: Reported on 02/10/2021 01/05/21   Jerrol Banana., MD  meclizine (ANTIVERT) 12.5 MG tablet Take 1 tablet (12.5 mg total) by mouth 3 (three) times daily. Patient not taking: Reported on 09/15/2020 08/03/20   Nolberto Hanlon, MD  metoprolol succinate (TOPROL-XL) 25 MG 24 hr tablet Take 0.5 tablets (12.5 mg total) by mouth at bedtime. Patient not taking: Reported on 09/15/2020 08/03/20   Nolberto Hanlon, MD  rOPINIRole (REQUIP) 1 MG tablet Take 4 tablets (4 mg total) by mouth at bedtime. Patient not taking: Reported on 09/15/2020 08/22/20 09/21/20  Terrilee Croak, MD    Physical Exam: Vitals:   02/10/21 1200 02/10/21 1214 02/10/21 1230 02/10/21 1300  BP: (!) 165/88 (!) 165/88 (!) 120/99 139/69  Pulse: (!) 111 (!) 110 100 86  Resp: '18  14 16  ' Temp:      TempSrc:      SpO2: 96%  96% 94%  Weight:      Height:         Vitals:   02/10/21 1200 02/10/21 1214 02/10/21 1230 02/10/21 1300  BP: (!) 165/88 (!) 165/88 (!) 120/99 139/69  Pulse: (!) 111 (!) 110 100 86  Resp: '18  14 16  ' Temp:      TempSrc:      SpO2: 96%  96% 94%  Weight:      Height:          Constitutional:  Lethargic but arouses to verbal stimuli.  Oriented only to person. Not in any apparent distress HEENT:      Head: Normocephalic and atraumatic.         Eyes: PERLA, EOMI, Conjunctivae are normal. Sclera is non-icteric.       Mouth/Throat: Mucous membranes are DRY.       Neck: Supple with no signs of meningismus. Cardiovascular: Tachycardia. No murmurs, gallops, or rubs. 2+ symmetrical distal pulses are present . No JVD. No LE edema Respiratory: Respiratory effort normal .Lungs sounds clear bilaterally. No wheezes, crackles, or rhonchi.  Gastrointestinal: Soft, non tender, and non distended with positive bowel sounds.  Genitourinary: No CVA tenderness. Musculoskeletal: Nontender with normal range of motion in all extremities. No cyanosis, or erythema of extremities. Neurologic:  Face is symmetric. Moving all extremities. No gross focal neurologic deficits.  Generalized weakness Skin: Skin is warm, dry.  No rash or ulcers Psychiatric: Mood and affect are normal    Labs on Admission: I have personally reviewed following labs and imaging studies  CBC: Recent Labs  Lab 02/10/21 0904  WBC 10.5  NEUTROABS 8.5*  HGB 14.0  HCT 42.5  MCV 97.7  PLT 962   Basic Metabolic Panel: Recent Labs  Lab 02/10/21 0904  NA 133*  K 2.9*  CL 95*  CO2 29  GLUCOSE 116*  BUN 24*  CREATININE 1.62*  CALCIUM 9.7  MG 1.9   GFR: Estimated Creatinine Clearance: 31.4 mL/min (A) (by C-G formula based on SCr of 1.62 mg/dL (H)). Liver Function Tests: Recent Labs  Lab 02/10/21 0904  AST 47*  ALT 14  ALKPHOS 86  BILITOT 0.8  PROT 7.9  ALBUMIN 4.0   No results for input(s): LIPASE, AMYLASE in  the last 168 hours. No results for input(s): AMMONIA in the last 168 hours. Coagulation Profile: Recent Labs  Lab 02/10/21 0904  INR 1.0   Cardiac Enzymes: No results for input(s): CKTOTAL, CKMB, CKMBINDEX, TROPONINI in the last 168 hours. BNP (last 3 results) No results for input(s): PROBNP in the  last 8760 hours. HbA1C: No results for input(s): HGBA1C in the last 72 hours. CBG: No results for input(s): GLUCAP in the last 168 hours. Lipid Profile: No results for input(s): CHOL, HDL, LDLCALC, TRIG, CHOLHDL, LDLDIRECT in the last 72 hours. Thyroid Function Tests: No results for input(s): TSH, T4TOTAL, FREET4, T3FREE, THYROIDAB in the last 72 hours. Anemia Panel: No results for input(s): VITAMINB12, FOLATE, FERRITIN, TIBC, IRON, RETICCTPCT in the last 72 hours. Urine analysis:    Component Value Date/Time   COLORURINE YELLOW (A) 02/10/2021 1213   APPEARANCEUR CLOUDY (A) 02/10/2021 1213   APPEARANCEUR Clear 02/26/2014 0246   LABSPEC 1.012 02/10/2021 1213   LABSPEC 1.025 02/26/2014 0246   PHURINE 6.0 02/10/2021 1213   GLUCOSEU NEGATIVE 02/10/2021 1213   GLUCOSEU Negative 02/26/2014 0246   HGBUR SMALL (A) 02/10/2021 1213   BILIRUBINUR NEGATIVE 02/10/2021 1213   BILIRUBINUR Negative 02/26/2014 0246   KETONESUR 5 (A) 02/10/2021 1213   PROTEINUR 30 (A) 02/10/2021 1213   NITRITE NEGATIVE 02/10/2021 1213   LEUKOCYTESUR LARGE (A) 02/10/2021 1213   LEUKOCYTESUR Negative 02/26/2014 0246    Radiological Exams on Admission: DG Chest Port 1 View  Result Date: 02/10/2021 CLINICAL DATA:  Concern for sepsis EXAM: PORTABLE CHEST 1 VIEW COMPARISON:  Chest x-ray dated August 21, 2020 FINDINGS: Patient is rotated to the left. Cardiac and mediastinal contours are unchanged. Mild left basilar opacities. No large pleural effusion pneumothorax. IMPRESSION: Mild left basilar opacities, likely due to atelectasis. Electronically Signed   By: Yetta Glassman M.D.   On: 02/10/2021 09:29   CT Renal Stone Study  Result Date: 02/10/2021 CLINICAL DATA:  Neurogenic bladder. EXAM: CT ABDOMEN AND PELVIS WITHOUT CONTRAST TECHNIQUE: Multidetector CT imaging of the abdomen and pelvis was performed following the standard protocol without IV contrast. RADIATION DOSE REDUCTION: This exam was performed according to  the departmental dose-optimization program which includes automated exposure control, adjustment of the mA and/or kV according to patient size and/or use of iterative reconstruction technique. COMPARISON:  04/13/2020 FINDINGS: Lower chest: Heart is enlarged. Hepatobiliary: No focal abnormality in the liver on this study without intravenous contrast. There is no evidence for gallstones, gallbladder wall thickening, or pericholecystic fluid. No intrahepatic or extrahepatic biliary dilation. Pancreas: No focal mass lesion. No dilatation of the main duct. No intraparenchymal cyst. No peripancreatic edema. Spleen: No splenomegaly. No focal mass lesion. Adrenals/Urinary Tract: No adrenal nodule or mass. Tiny gastric fundal diverticulum projects in the region of the left adrenal gland. Several tiny stones in the right kidney (measuring up to 3-4 mm) are nonobstructing. No right ureteral stone. Patient is also noted to have several tiny 1-3 mm nonobstructing stones in the left kidney. No left ureteral stones. No secondary changes in the left kidney or ureter. Bladder is decompressed without stone disease. Stomach/Bowel: Stomach is nondistended. There is a tiny fundal diverticulum, stable since CT of 06/01/2010. Duodenum is normally positioned as is the ligament of Treitz. No small bowel wall thickening. No small bowel dilatation. Status post subtotal colectomy Vascular/Lymphatic: There is mild atherosclerotic calcification of the abdominal aorta without aneurysm. No abdominal lymphadenopathy. No pelvic sidewall lymphadenopathy. Reproductive: Uterus surgically absent. Vagina is distended with fluid in there  is a small gas bubble in the vaginal lumen. Sagittal imaging shows loss of fat plane between the vaginal cuff, small-bowel loops, and colon. Other: No intraperitoneal free fluid. Musculoskeletal: Status post right hip replacement bones are diffusely demineralized. Probable remote trauma in the region of the right pubic  bone. IMPRESSION: 1. Bilateral nonobstructing renal stones. No ureteral or bladder stones. Bladder is decompressed. 2. Status post subtotal colectomy. 3. Vagina is distended with fluid and there is a small gas bubble in the vaginal lumen. Sagittal imaging shows loss of fat plane between the vaginal cuff, small-bowel loops, and colon. Appearance may be related to a vaginal stricture or obstruction. Colovaginal or enterovaginal fistula not excluded. 4. Cardiomegaly. 5. Aortic Atherosclerosis (ICD10-I70.0). Electronically Signed   By: Misty Stanley M.D.   On: 02/10/2021 14:47     Assessment/Plan Principal Problem:   Sepsis secondary to UTI [facility]) Active Problems:   Chronic kidney disease (CKD), stage III (moderate) (HCC)   Parkinson disease (HCC)   Hypokalemia   Dehydration   Acute metabolic encephalopathy   UTI (urinary tract infection)   HTN (hypertension)   Patient is a 76 year old female admitted to the hospital for sepsis from urinary tract infection.     Sepsis from UTI As evidenced by tachycardia, lactic acidosis, pyuria, normal white cell count with a left shift and mental status changes. Prior urine culture yielded Klebsiella pneumonia sensitive to cephalosporins. Will place patient empirically on Rocephin 1 g IV daily Aggressive IV fluid resuscitation Follow-up results of blood and urine culture Repeat lactate levels   Generalized weakness/orthostatic hypotension dysautonomic syndrome Secondary to known Parkinson's disease Continue fludrocortisone Continue Sinemet Place patient on fall precautions        Hypokalemia Supplement potassium Check magnesium level       Dehydration in the setting of underlying chronic kidney disease stage III Patient's baseline serum creatinine is 1.24 but on admission today it is 1.62 Encourage oral fluid intake Judicious IV fluid hydration Repeat renal parameters in a.m.         Anxiety Continue Xanax     Chronic pain  syndrome Continue tramadol and Lidoderm patch  DVT prophylaxis: Lovenox  Code Status: DO NOT RESUSCITATE Family Communication: Greater than 50% of time was spent discussing the patient's condition and plan of care with her husband and healthcare power of attorney at the bedside.  All questions and concerns have been addressed.  He verbalizes understanding and agrees to the plan.  CODE STATUS was discussed and she is a DO NOT RESUSCITATE. Disposition Plan: Back to previous home environment Consults called: none  Status:At the time of admission, it appears that the appropriate admission status for this patient is inpatient. This is judged to be reasonable and necessary to provide the required intensity of service to ensure the patient's safety given the presenting symptoms, physical exam findings, and initial radiographic and laboratory data in the context of their comorbid conditions. Patient requires inpatient status due to high intensity of service, high risk for further deterioration and high frequency of surveillance required.     Collier Bullock MD [facility]     02/10/2021, 3:27 PM

## 2021-02-10 NOTE — ED Notes (Signed)
[place], MD at bedside

## 2021-02-10 NOTE — Consult Note (Signed)
CODE SEPSIS - PHARMACY COMMUNICATION  **Broad Spectrum Antibiotics should be administered within 1 hour of Sepsis diagnosis**  Time Code Sepsis Called/Page Received: 0904  Antibiotics Ordered: Cefepime 2g IV, Metronidazole 500 IV x1  Time of 1st antibiotic administration: [place], PharmD, MS PGPM Clinical Pharmacist 02/10/2021 9:55 AM

## 2021-02-10 NOTE — Progress Notes (Signed)
Spoke to patient's spouse on the phone to gather admission information.Patient uses a walker and 1x assist at baseline to get around. Patient takes pills whole with pudding. Patient takes PRN xanax a few times a day along with tramadol. Patient and spouse lost their son 3 weeks ago. They have a daughter and graddaughter that are involved as well. Patient has had covid vaccines, but will not take the flu vaccine.

## 2021-02-10 NOTE — Sepsis Progress Note (Signed)
Code Sepsis protocol being monitored by eLink. 

## 2021-02-10 NOTE — ED Provider Notes (Signed)
[facility] Provider Note    Event Date/Time   First MD Initiated Contact with Patient 02/10/21 1430     (approximate)   History   Altered Mental Status   HPI  Terri Perez is a 76 y.o. female with a history of Parkinson's disease, GERD, anxiety who comes ED complaining of increased confusion and generalized weakness since yesterday.  Also had syncope witnessed by her husband at around 3:00 PM, and has not returned to baseline since then.  She has not been able to take her medicines since yesterday afternoon including her Xanax, Sinemet, and metoprolol due to the altered mental status.  No significant head trauma.     Physical Exam   Triage Vital Signs: ED Triage Vitals  Enc Vitals Group     BP 02/10/21 0901 (!) 175/94     Pulse Rate 02/10/21 0901 (!) 118     Resp 02/10/21 0901 20     Temp 02/10/21 0901 97.6 F (36.4 C)     Temp Source 02/10/21 0901 Oral     SpO2 02/10/21 0858 93 %     Weight 02/10/21 0903 167 lb 8.8 oz (76 kg)     Height 02/10/21 0903 5\' 7"  (1.702 m)     Head Circumference --      Peak Flow --      Pain Score 02/10/21 0903 0     Pain Loc --      Pain Edu? --      Excl. in [place]? --     Most recent vital signs: Vitals:   02/10/21 1230 02/10/21 1300  BP: (!) 120/99 139/69  Pulse: 100 86  Resp: 14 16  Temp:    SpO2: 96% 94%     General: Awake, ill-appearing, not oriented CV:  Good peripheral perfusion.  Tachycardia, heart rate 110.  Symmetric pulses Resp:  Normal effort.  No wheezes or crackles Abd:  No distention.  There is suprapubic tenderness. Other:  No lower extremity edema.  Dry oral mucosa.   ED Results / Procedures / Treatments   Labs (all labs ordered are listed, but only abnormal results are displayed) Labs Reviewed  LACTIC ACID, PLASMA - Abnormal; Notable for the following components:      Result Value   Lactic Acid, Venous 2.6 (*)    All other components within normal limits  LACTIC ACID,  PLASMA - Abnormal; Notable for the following components:   Lactic Acid, Venous 2.6 (*)    All other components within normal limits  COMPREHENSIVE METABOLIC PANEL - Abnormal; Notable for the following components:   Sodium 133 (*)    Potassium 2.9 (*)    Chloride 95 (*)    Glucose, Bld 116 (*)    BUN 24 (*)    Creatinine, Ser 1.62 (*)    AST 47 (*)    GFR, Estimated 33 (*)    All other components within normal limits  CBC WITH DIFFERENTIAL/PLATELET - Abnormal; Notable for the following components:   Neutro Abs 8.5 (*)    All other components within normal limits  URINALYSIS, COMPLETE (UACMP) WITH MICROSCOPIC - Abnormal; Notable for the following components:   Color, Urine YELLOW (*)    APPearance CLOUDY (*)    Hgb urine dipstick SMALL (*)    Ketones, ur 5 (*)    Protein, ur 30 (*)    Leukocytes,Ua LARGE (*)    WBC, UA >50 (*)    All other components within normal  limits  RESP PANEL BY RT-PCR (FLU A&B, COVID) ARPGX2  CULTURE, BLOOD (ROUTINE X 2)  CULTURE, BLOOD (ROUTINE X 2)  URINE CULTURE  PROTIME-INR  APTT  MAGNESIUM     EKG  Interpreted by me Sinus tachycardia rate 117.  Normal axis and intervals.  Normal QRS ST segments and T waves.  No acute ischemic changes.   RADIOLOGY Chest x-ray viewed and interpreted by me, appears unremarkable without edema effusion or consolidation.  Radiology report reviewed.  CT images reviewed by me, no hydronephrosis or ureteral obstruction.  Bladder is relatively decompressed but with some degree of bladder wall edema consistent with cystitis.  Radiology report reviewed      PROCEDURES:  Critical Care performed: No  .1-3 Lead EKG Interpretation Performed by: Carrie Mew, MD Authorized by: Carrie Mew, MD     Interpretation: abnormal     ECG rate:  110   ECG rate assessment: normal     Rhythm: sinus rhythm     Ectopy: none     Conduction: normal     MEDICATIONS ORDERED IN ED: Medications  ALPRAZolam (XANAX)  tablet 0.5 mg (0.5 mg Oral Given 02/10/21 1147)  carbidopa-levodopa (SINEMET IR) 25-100 MG per tablet immediate release 2 tablet (2 tablets Oral Given 02/10/21 1142)  potassium chloride SA (KLOR-CON M) CR tablet 40 mEq (40 mEq Oral Given 02/10/21 1223)  cefTRIAXone (ROCEPHIN) 1 g in sodium chloride 0.9 % 100 mL IVPB (has no administration in time range)  sodium chloride 0.9 % bolus 1,000 mL (0 mLs Intravenous Stopped 02/10/21 1044)  ceFEPIme (MAXIPIME) 2 g in sodium chloride 0.9 % 100 mL IVPB (0 g Intravenous Stopped 02/10/21 1017)  metroNIDAZOLE (FLAGYL) IVPB 500 mg (0 mg Intravenous Stopped 02/10/21 1057)  metoprolol tartrate (LOPRESSOR) tablet 12.5 mg (12.5 mg Oral Given 02/10/21 1214)     IMPRESSION / MDM / ASSESSMENT AND PLAN / ED COURSE  I reviewed the triage vital signs and the nursing notes.                              Differential diagnosis includes, but is not limited to, pneumonia, UTI, electrolyte abnormality, dehydration, renal insufficiency   The patient is on the cardiac monitor to evaluate for evidence of arrhythmia and/or significant heart rate changes.  Patient presents with generalized weakness and worsening confusion in the setting of Parkinson's disease and inability to take her medicine since yesterday due to acute illness.  Patient does appear dehydrated, will give IV fluids.  Other than tachycardia, other vital signs are unremarkable.  She is not septic.  Work-up reveals a UTI as well as acute renal insufficiency with a creatinine of 1.6 on a baseline of 0.9.  Also shows some degree of hypokalemia and hyponatremia.  Patient is able to take medicine orally in the ED, so she was given her Sinemet as well as oral potassium replacement.  IV fluid bolus for hydration.  She was initially given cefepime and Flagyl concern for aspiration pneumonia, but oxygenation is normal, chest x-ray is clear, and work-up reveals UTI.  This is adequately covered by the cefepime for now, will  defer further antibiotics to admitting team.  Case discussed with the hospitalist.      FINAL CLINICAL IMPRESSION(S) / ED DIAGNOSES   Final diagnoses:  Cystitis  Parkinson's disease ([place])     Rx / DC Orders   ED Discharge Orders     None  Note:  This document was prepared using Dragon voice recognition software and may include unintentional dictation errors.   Carrie Mew, MD 02/10/21 1444

## 2021-02-10 NOTE — ED Triage Notes (Signed)
Patient to ED via [facility] from home for AMS. Patient is not as responsive normal per husband since approx 3pm yesterday after a syncopal episode. Patient has hx of parkinson's and a right hip replacement.

## 2021-02-10 NOTE — ED Notes (Signed)
Patient at CT

## 2021-02-11 DIAGNOSIS — N183 Chronic kidney disease, stage 3 unspecified: Secondary | ICD-10-CM

## 2021-02-11 DIAGNOSIS — G20 Parkinson's disease: Secondary | ICD-10-CM

## 2021-02-11 DIAGNOSIS — I10 Essential (primary) hypertension: Secondary | ICD-10-CM

## 2021-02-11 DIAGNOSIS — N3001 Acute cystitis with hematuria: Secondary | ICD-10-CM

## 2021-02-11 DIAGNOSIS — E876 Hypokalemia: Secondary | ICD-10-CM

## 2021-02-11 DIAGNOSIS — N3090 Cystitis, unspecified without hematuria: Secondary | ICD-10-CM

## 2021-02-11 DIAGNOSIS — E860 Dehydration: Secondary | ICD-10-CM

## 2021-02-11 DIAGNOSIS — G9341 Metabolic encephalopathy: Secondary | ICD-10-CM

## 2021-02-11 LAB — URINE CULTURE

## 2021-02-11 LAB — CBC
HCT: 36 % (ref 36.0–46.0)
Hemoglobin: 11.8 g/dL — ABNORMAL LOW (ref 12.0–15.0)
MCH: 31.7 pg (ref 26.0–34.0)
MCHC: 32.8 g/dL (ref 30.0–36.0)
MCV: 96.8 fL (ref 80.0–100.0)
Platelets: 197 10*3/uL (ref 150–400)
RBC: 3.72 MIL/uL — ABNORMAL LOW (ref 3.87–5.11)
RDW: 13.6 % (ref 11.5–15.5)
WBC: 8.4 10*3/uL (ref 4.0–10.5)
nRBC: 0 % (ref 0.0–0.2)

## 2021-02-11 LAB — BASIC METABOLIC PANEL
Anion gap: 5 (ref 5–15)
BUN: 21 mg/dL (ref 8–23)
CO2: 29 mmol/L (ref 22–32)
Calcium: 9.4 mg/dL (ref 8.9–10.3)
Chloride: 110 mmol/L (ref 98–111)
Creatinine, Ser: 1.13 mg/dL — ABNORMAL HIGH (ref 0.44–1.00)
GFR, Estimated: 50 mL/min — ABNORMAL LOW (ref >60)
Glucose, Bld: 98 mg/dL (ref 70–99)
Potassium: 3.9 mmol/L (ref 3.5–5.1)
Sodium: 144 mmol/L (ref 135–145)

## 2021-02-11 LAB — CORTISOL-AM, BLOOD: Cortisol - AM: 19.3 ug/dL (ref 6.7–22.6)

## 2021-02-11 LAB — MAGNESIUM: Magnesium: 1.7 mg/dL (ref 1.7–2.4)

## 2021-02-11 LAB — PROTIME-INR
INR: 1.1 (ref 0.8–1.2)
Prothrombin Time: 14.1 seconds (ref 11.4–15.2)

## 2021-02-11 LAB — PROCALCITONIN: Procalcitonin: <0.1 ng/mL

## 2021-02-11 MED ORDER — METOPROLOL SUCCINATE ER 25 MG PO TB24
12.5000 mg | ORAL_TABLET | Freq: Every day | ORAL | Status: DC
  Administered 2021-02-11 – 2021-02-18 (×7): 12.5 mg via ORAL
  Filled 2021-02-11 (×8): qty 1

## 2021-02-11 MED ORDER — ALPRAZOLAM 0.5 MG PO TABS
0.5000 mg | ORAL_TABLET | Freq: Three times a day (TID) | ORAL | Status: DC | PRN
  Administered 2021-02-11 – 2021-02-12 (×4): 0.5 mg via ORAL
  Filled 2021-02-11 (×4): qty 1

## 2021-02-11 NOTE — TOC Initial Note (Signed)
Transition of Care [facility]) - Initial/Assessment Note    Patient Details  Name: Terri Perez MRN: [id number] Date of Birth: 14-Dec-1945  [facility]) CM/SW Contact:    Pete Pelt, RN Phone Number: 02/11/2021, 2:18 PM  Clinical Narrative:   Spoke to spouse due to patient being confused.  Spouse states that he cares for patient on his own every day except Wednesday, when his daughter assists.  There are no other caregivers in the home at this time.  Patient has DME at home, spouse said he does not anticipate needs.  Spouse transports patient to appointments, and she is up to date on all appointments.  He is able to get her medications from the pharmacy and administer them as prescribed.  Mr. Meloche states he is unsure how long he will be able to care for his wife at home.  He states it is taxing him mentally and physically.  He would like to speak with care team more tomorrow regarding disposition plans, as he is at an appointment today.  TOC contact information provided, TOC to follow                Expected Discharge Plan:  (tBD) Barriers to Discharge: Continued Medical Work up   Patient Goals and CMS Choice        Expected Discharge Plan and Services Expected Discharge Plan:  (tBD)   Discharge Planning Services: CM Consult   Living arrangements for the past 2 months: Single Family Home                                      Prior Living Arrangements/Services Living arrangements for the past 2 months: Single Family Home Lives with:: Self, Spouse Patient language and need for interpreter reviewed:: Yes Do you feel safe going back to the place where you live?: Yes      Need for Family Participation in Patient Care: Yes (Comment) Care giver support system in place?: Yes (comment)   Criminal Activity/Legal Involvement Pertinent to Current Situation/Hospitalization: No - Comment as needed  Activities of Daily Living Home Assistive  Devices/Equipment: Environmental consultant (specify type), Wheelchair ADL Screening (condition at time of admission) Patient's cognitive ability adequate to safely complete daily activities?: No Is the patient deaf or have difficulty hearing?: No Does the patient have difficulty seeing, even when wearing glasses/contacts?: No Does the patient have difficulty concentrating, remembering, or making decisions?: Yes Patient able to express need for assistance with ADLs?: No Does the patient have difficulty dressing or bathing?: Yes Independently performs ADLs?: No Communication: Independent Dressing (OT): Dependent Is this a change from baseline?: Change from baseline, expected to last >3 days Grooming: Dependent Is this a change from baseline?: Change from baseline, expected to last >3 days Feeding: Dependent Is this a change from baseline?: Change from baseline, expected to last >3 days Bathing: Dependent Is this a change from baseline?: Change from baseline, expected to last >3 days Toileting: Dependent Is this a change from baseline?: Change from baseline, expected to last >3days In/Out Bed: Dependent Is this a change from baseline?: Change from baseline, expected to last >3 days Walks in Home: Dependent Is this a change from baseline?: Change from baseline, expected to last >3 days Does the patient have difficulty walking or climbing stairs?: No Weakness of Legs: Both Weakness of Arms/Hands: Both  Permission Sought/Granted Permission sought to share information with : Case  Manager Permission granted to share information with : Yes, Verbal Permission Granted              Emotional Assessment Appearance:: Appears stated age Attitude/Demeanor/Rapport:  (patient is confused, spoke with husband)   Orientation: : Oriented to Self Alcohol / Substance Use: Not Applicable Psych Involvement: No (comment)  Admission diagnosis:  UTI (urinary tract infection) [N39.0] Cystitis [N30.90] Parkinson's  disease ([place]) [G20] Patient Active Problem List   Diagnosis Date Noted   Cystitis    Sepsis secondary to UTI ([place]) 02/10/2021   HTN (hypertension) 08/21/2020   HLD (hyperlipidemia) 08/21/2020   CKD (chronic kidney disease), stage IIIa 08/21/2020   Hypomagnesemia 08/21/2020   UTI (urinary tract infection) 07/30/2020   Generalized weakness 07/30/2020   B12 deficiency anemia 04/19/2020   Elevated troponin    Intractable vomiting with nausea    Hypokalemia 04/13/2020   Dehydration 04/13/2020   Syncope and collapse 16/10/9602   Acute metabolic encephalopathy 54/09/8117   AKI (acute kidney injury) ([place]) 04/13/2020   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   Fracture of sacrum ([place]) 08/04/2016   Adiposity 05/05/2016   Parkinson disease ([place]) 04/25/2016   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   Bilateral lower extremity edema 07/23/2015   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   Chronic kidney disease (CKD), stage III (moderate) (HCC) 05/11/2015   Elevated rheumatoid factor 05/11/2015   Primary osteoarthritis of both hips 05/11/2015   Syncope 04/24/2015   History of operative procedure on hip 04/24/2015   Restless leg 04/24/2015   Osteoporosis 03/30/2015   Insomnia 03/30/2015   Skin cancer 03/30/2015   MDD (major depressive disorder) 03/30/2015   Migraine 03/30/2015   GERD (gastroesophageal reflux disease) 03/30/2015   IBS (irritable bowel syndrome) 03/30/2015   OA (osteoarthritis) 03/30/2015   Skin lesion of chest wall 07/17/2014   Closed fracture of shaft of femur ([place]) 04/01/2014   Fracture of bone adjacent to prosthesis 04/01/2014   Breast cancer ([place]) 03/07/2013   Malignant neoplasm of upper-outer quadrant of female breast ([place])    Acute anxiety 06/11/2008   CONSTIPATION, CHRONIC 06/11/2008   Fibromyalgia 06/11/2008   PCP:  Jerrol Banana., MD Pharmacy:   [facility] [id number] -  [address], [place] [address] Phone: [phone] Fax: [phone]     Social Determinants of Health (SDOH) Interventions    Readmission Risk Interventions No flowsheet data found.

## 2021-02-11 NOTE — Plan of Care (Signed)

## 2021-02-11 NOTE — Progress Notes (Signed)
PROGRESS NOTE  Terri Perez    DOB: December 26, 1945, 76 y.o.  ZHY:[id number]  PCP: Jerrol Banana., MD   Code Status: DNR   DOA: 02/10/2021   LOS: 1  Brief Narrative of Current Hospitalization  Terri Perez is a 76 y.o. female with a PMH significant for Parkinson's disease, history of rheumatoid arthritis, fibromyalgia. They presented from home to the ED on 02/10/2021 with acute delirium x1 days. In the ED, it was found that they had UTI complicated with acute delirium. She was thought to meet sepsis criteria with tachycardia, reported fever, elevated LA, and urinary source of infection. They were treated with IV CTX and fluid resuscitation.  Patient was admitted to medicine service for further workup and management of sepsis as outlined in detail below.  02/11/21 -stable, improved  Assessment & Plan  Principal Problem:   Sepsis secondary to UTI [facility]) Active Problems:   Chronic kidney disease (CKD), stage III (moderate) (HCC)   Parkinson disease (HCC)   Hypokalemia   Dehydration   Acute metabolic encephalopathy   UTI (urinary tract infection)   HTN (hypertension)  Sepsis 2/2 complicated UTI   metabolic encephalopathy- sepsis criteria have resolved. LA normal x2. Patient is alert and oriented x2 today. Still not at baseline mentation. Had urinary incontinence and some confusion about her current illness. Recorded Tmax this am is 100. Remains mildly tachycardic but improved. - continue CTX (1/18- - follow BCx and Ucx - monitor fever curve - can dc fluids now that patient is more alert and can tolerate PO - PT/OT  Parkinson's disease   dementia- chronic, stable - continue home carbidopa/levodopa - delirium precautions  Orthostatic hypotension dysautonomic syndrome- chronic. Elevated blood pressures this admission but labile.  - monitor Bps closely - continue fludrocortisone  Hypokalemia- POA. Resolved s/p repletion. K+ 3.9 today - monitor and replete PRN  AKI on  CKD IIIb- (resolved s/p IV fluids). baseline Cr around 1.24. Cr today 1.13 - avoid nephrotoxic agents  Anxiety- chronic, stable - continue home PRN xanax  DVT prophylaxis: enoxaparin (LOVENOX) injection 40 mg Start: 02/10/21 1800   Diet:  Diet Orders (From admission, onward)     Start     Ordered   02/10/21 1526  DIET DYS 2 Room service appropriate? Yes; Fluid consistency: Thin  Diet effective now       Question Answer Comment  Room service appropriate? Yes   Fluid consistency: Thin      02/10/21 1526            Subjective 02/11/21    Pt reports being wet- from incontinence. She denies any current pain. Can tell me her name and recognizes she is in a hospital. Able to follow simple commands but unable to answer more complex questions. Focused on being wet. Nurse was called.   Disposition Plan & Communication  Patient status: Inpatient  Admitted From: Home Disposition: Home Anticipated discharge date: TBD  Family Communication: none  Consults, Procedures, Significant Events  Consultants:  none  Procedures/significant events:  None  Antimicrobials:  Anti-infectives (From admission, onward)    Start     Dose/Rate Route Frequency Ordered Stop   02/11/21 0000  cefTRIAXone (ROCEPHIN) 1 g in sodium chloride 0.9 % 100 mL IVPB        1 g 200 mL/hr over 30 Minutes Intravenous Every 24 hours 02/10/21 1430     02/10/21 1345  cefTRIAXone (ROCEPHIN) 2 g in sodium chloride 0.9 % 100 mL IVPB  Status:  Discontinued        2 g 200 mL/hr over 30 Minutes Intravenous  Once 02/10/21 1334 02/10/21 1414   02/10/21 0915  ceFEPIme (MAXIPIME) 2 g in sodium chloride 0.9 % 100 mL IVPB        2 g 200 mL/hr over 30 Minutes Intravenous  Once 02/10/21 0905 02/10/21 1017   02/10/21 0915  metroNIDAZOLE (FLAGYL) IVPB 500 mg        500 mg 100 mL/hr over 60 Minutes Intravenous  Once 02/10/21 0905 02/10/21 1057       Objective   Vitals:   02/10/21 1300 02/10/21 1653 02/10/21 1951 02/11/21  0418  BP: 139/69 (!) 148/91 (!) 157/84 (!) 190/86  Pulse: 86 (!) 102 (!) 102 100  Resp: 16 17 20 20   Temp:  98.2 F (36.8 C) 99.2 F (37.3 C) 100 F (37.8 C)  TempSrc:   Oral Oral  SpO2: 94% 96% 95% 95%  Weight:      Height:        Intake/Output Summary (Last 24 hours) at 02/11/2021 0659 Last data filed at 02/10/2021 1800 Gross per 24 hour  Intake 211.54 ml  Output --  Net 211.54 ml   Filed Weights   02/10/21 0903  Weight: 76 kg    Patient BMI: Body mass index is 26.24 kg/m.   Physical Exam:  General: awake, alert, NAD HEENT: atraumatic, clear conjunctiva, anicteric sclera, MMM, hearing grossly normal Respiratory: normal respiratory effort. Cardiovascular: normal S1/S2, RRR, no JVD, murmurs, quick capillary refill  Gastrointestinal: soft, NT, ND Nervous: A&O x2. no gross focal neurologic deficits, repetitive speech Extremities: moves all equally, no edema, normal tone Skin: dry, intact, normal temperature, normal color. No rashes, lesions or ulcers on exposed skin Psychiatry: pleasant. Flat affect.  Labs   I have personally reviewed following labs and imaging studies Admission on 02/10/2021  Component Date Value Ref Range Status   SARS Coronavirus 2 by RT PCR 02/10/2021 NEGATIVE  NEGATIVE Final   Influenza A by PCR 02/10/2021 NEGATIVE  NEGATIVE Final   Influenza B by PCR 02/10/2021 NEGATIVE  NEGATIVE Final   Lactic Acid, Venous 02/10/2021 2.6 (HH)  0.5 - 1.9 mmol/L Final   Lactic Acid, Venous 02/10/2021 2.6 (HH)  0.5 - 1.9 mmol/L Final   Sodium 02/10/2021 133 (L)  135 - 145 mmol/L Final   Potassium 02/10/2021 2.9 (L)  3.5 - 5.1 mmol/L Final   Chloride 02/10/2021 95 (L)  98 - 111 mmol/L Final   CO2 02/10/2021 29  22 - 32 mmol/L Final   Glucose, Bld 02/10/2021 116 (H)  70 - 99 mg/dL Final   BUN 02/10/2021 24 (H)  8 - 23 mg/dL Final   Creatinine, Ser 02/10/2021 1.62 (H)  0.44 - 1.00 mg/dL Final   Calcium 02/10/2021 9.7  8.9 - 10.3 mg/dL Final   Total Protein  02/10/2021 7.9  6.5 - 8.1 g/dL Final   Albumin 02/10/2021 4.0  3.5 - 5.0 g/dL Final   AST 02/10/2021 47 (H)  15 - 41 U/L Final   ALT 02/10/2021 14  0 - 44 U/L Final   Alkaline Phosphatase 02/10/2021 86  38 - 126 U/L Final   Total Bilirubin 02/10/2021 0.8  0.3 - 1.2 mg/dL Final   GFR, Estimated 02/10/2021 33 (L)  >60 mL/min Final   Anion gap 02/10/2021 9  5 - 15 Final   WBC 02/10/2021 10.5  4.0 - 10.5 K/uL Final   RBC 02/10/2021 4.35  3.87 - 5.11 MIL/uL  Final   Hemoglobin 02/10/2021 14.0  12.0 - 15.0 g/dL Final   HCT 02/10/2021 42.5  36.0 - 46.0 % Final   MCV 02/10/2021 97.7  80.0 - 100.0 fL Final   MCH 02/10/2021 32.2  26.0 - 34.0 pg Final   MCHC 02/10/2021 32.9  30.0 - 36.0 g/dL Final   RDW 02/10/2021 13.5  11.5 - 15.5 % Final   Platelets 02/10/2021 228  150 - 400 K/uL Final   nRBC 02/10/2021 0.0  0.0 - 0.2 % Final   Neutrophils Relative % 02/10/2021 82  % Final   Neutro Abs 02/10/2021 8.5 (H)  1.7 - 7.7 K/uL Final   Lymphocytes Relative 02/10/2021 13  % Final   Lymphs Abs 02/10/2021 1.4  0.7 - 4.0 K/uL Final   Monocytes Relative 02/10/2021 5  % Final   Monocytes Absolute 02/10/2021 0.5  0.1 - 1.0 K/uL Final   Eosinophils Relative 02/10/2021 0  % Final   Eosinophils Absolute 02/10/2021 0.0  0.0 - 0.5 K/uL Final   Basophils Relative 02/10/2021 0  % Final   Basophils Absolute 02/10/2021 0.0  0.0 - 0.1 K/uL Final   Immature Granulocytes 02/10/2021 0  % Final   Abs Immature Granulocytes 02/10/2021 0.03  0.00 - 0.07 K/uL Final   Prothrombin Time 02/10/2021 13.2  11.4 - 15.2 seconds Final   INR 02/10/2021 1.0  0.8 - 1.2 Final   aPTT 02/10/2021 25  24 - 36 seconds Final   Color, Urine 02/10/2021 YELLOW (A)  YELLOW Final   APPearance 02/10/2021 CLOUDY (A)  CLEAR Final   Specific Gravity, Urine 02/10/2021 1.012  1.005 - 1.030 Final   pH 02/10/2021 6.0  5.0 - 8.0 Final   Glucose, UA 02/10/2021 NEGATIVE  NEGATIVE mg/dL Final   Hgb urine dipstick 02/10/2021 SMALL (A)  NEGATIVE Final    Bilirubin Urine 02/10/2021 NEGATIVE  NEGATIVE Final   Ketones, ur 02/10/2021 5 (A)  NEGATIVE mg/dL Final   Protein, ur 02/10/2021 30 (A)  NEGATIVE mg/dL Final   Nitrite 02/10/2021 NEGATIVE  NEGATIVE Final   Leukocytes,Ua 02/10/2021 LARGE (A)  NEGATIVE Final   RBC / HPF 02/10/2021 6-10  0 - 5 RBC/hpf Final   WBC, UA 02/10/2021 >50 (H)  0 - 5 WBC/hpf Final   Bacteria, UA 02/10/2021 NONE SEEN  NONE SEEN Final   Squamous Epithelial / LPF 02/10/2021 6-10  0 - 5 Final   WBC Clumps 02/10/2021 PRESENT   Final   Mucus 02/10/2021 PRESENT   Final   Magnesium 02/10/2021 1.9  1.7 - 2.4 mg/dL Final   Lactic Acid, Venous 02/10/2021 1.9  0.5 - 1.9 mmol/L Final   Lactic Acid, Venous 02/10/2021 1.8  0.5 - 1.9 mmol/L Final   Magnesium 02/11/2021 1.7  1.7 - 2.4 mg/dL Final   Prothrombin Time 02/11/2021 14.1  11.4 - 15.2 seconds Final   INR 02/11/2021 1.1  0.8 - 1.2 Final   WBC 02/11/2021 8.4  4.0 - 10.5 K/uL Final   RBC 02/11/2021 3.72 (L)  3.87 - 5.11 MIL/uL Final   Hemoglobin 02/11/2021 11.8 (L)  12.0 - 15.0 g/dL Final   HCT 02/11/2021 36.0  36.0 - 46.0 % Final   MCV 02/11/2021 96.8  80.0 - 100.0 fL Final   MCH 02/11/2021 31.7  26.0 - 34.0 pg Final   MCHC 02/11/2021 32.8  30.0 - 36.0 g/dL Final   RDW 02/11/2021 13.6  11.5 - 15.5 % Final   Platelets 02/11/2021 197  150 - 400 K/uL Final  nRBC 02/11/2021 0.0  0.0 - 0.2 % Final   Sodium 02/11/2021 144  135 - 145 mmol/L Final   Potassium 02/11/2021 3.9  3.5 - 5.1 mmol/L Final   Chloride 02/11/2021 110  98 - 111 mmol/L Final   CO2 02/11/2021 29  22 - 32 mmol/L Final   Glucose, Bld 02/11/2021 98  70 - 99 mg/dL Final   BUN 02/11/2021 21  8 - 23 mg/dL Final   Creatinine, Ser 02/11/2021 1.13 (H)  0.44 - 1.00 mg/dL Final   Calcium 02/11/2021 9.4  8.9 - 10.3 mg/dL Final   GFR, Estimated 02/11/2021 50 (L)  >60 mL/min Final   Anion gap 02/11/2021 5  5 - 15 Final    Imaging Studies  DG Chest Port 1 View  Result Date: 02/10/2021 CLINICAL DATA:  Concern for  sepsis EXAM: PORTABLE CHEST 1 VIEW COMPARISON:  Chest x-ray dated August 21, 2020 FINDINGS: Patient is rotated to the left. Cardiac and mediastinal contours are unchanged. Mild left basilar opacities. No large pleural effusion pneumothorax. IMPRESSION: Mild left basilar opacities, likely due to atelectasis. Electronically Signed   By: Yetta Glassman M.D.   On: 02/10/2021 09:29   CT Renal Stone Study  Result Date: 02/10/2021 CLINICAL DATA:  Neurogenic bladder. EXAM: CT ABDOMEN AND PELVIS WITHOUT CONTRAST TECHNIQUE: Multidetector CT imaging of the abdomen and pelvis was performed following the standard protocol without IV contrast. RADIATION DOSE REDUCTION: This exam was performed according to the departmental dose-optimization program which includes automated exposure control, adjustment of the mA and/or kV according to patient size and/or use of iterative reconstruction technique. COMPARISON:  04/13/2020 FINDINGS: Lower chest: Heart is enlarged. Hepatobiliary: No focal abnormality in the liver on this study without intravenous contrast. There is no evidence for gallstones, gallbladder wall thickening, or pericholecystic fluid. No intrahepatic or extrahepatic biliary dilation. Pancreas: No focal mass lesion. No dilatation of the main duct. No intraparenchymal cyst. No peripancreatic edema. Spleen: No splenomegaly. No focal mass lesion. Adrenals/Urinary Tract: No adrenal nodule or mass. Tiny gastric fundal diverticulum projects in the region of the left adrenal gland. Several tiny stones in the right kidney (measuring up to 3-4 mm) are nonobstructing. No right ureteral stone. Patient is also noted to have several tiny 1-3 mm nonobstructing stones in the left kidney. No left ureteral stones. No secondary changes in the left kidney or ureter. Bladder is decompressed without stone disease. Stomach/Bowel: Stomach is nondistended. There is a tiny fundal diverticulum, stable since CT of 06/01/2010. Duodenum is normally  positioned as is the ligament of Treitz. No small bowel wall thickening. No small bowel dilatation. Status post subtotal colectomy Vascular/Lymphatic: There is mild atherosclerotic calcification of the abdominal aorta without aneurysm. No abdominal lymphadenopathy. No pelvic sidewall lymphadenopathy. Reproductive: Uterus surgically absent. Vagina is distended with fluid in there is a small gas bubble in the vaginal lumen. Sagittal imaging shows loss of fat plane between the vaginal cuff, small-bowel loops, and colon. Other: No intraperitoneal free fluid. Musculoskeletal: Status post right hip replacement bones are diffusely demineralized. Probable remote trauma in the region of the right pubic bone. IMPRESSION: 1. Bilateral nonobstructing renal stones. No ureteral or bladder stones. Bladder is decompressed. 2. Status post subtotal colectomy. 3. Vagina is distended with fluid and there is a small gas bubble in the vaginal lumen. Sagittal imaging shows loss of fat plane between the vaginal cuff, small-bowel loops, and colon. Appearance may be related to a vaginal stricture or obstruction. Colovaginal or enterovaginal fistula not excluded. 4.  Cardiomegaly. 5. Aortic Atherosclerosis (ICD10-I70.0). Electronically Signed   By: Misty Stanley M.D.   On: 02/10/2021 14:47    Medications   Scheduled Meds:  carbidopa-levodopa  2 tablet Oral 5 X Daily   enoxaparin (LOVENOX) injection  40 mg Subcutaneous Q24H   fludrocortisone  100 mcg Oral Daily   lidocaine  1 patch Transdermal Q24H   pantoprazole  20 mg Oral Daily   sodium chloride flush  3 mL Intravenous Q12H   No recently discontinued medications to reconcile  LOS: 1 day   Richarda Osmond, DO [facility] 02/11/2021, 6:59 AM   Available by Epic secure chat 7AM-7PM. If 7PM-7AM, please contact night-coverage Refer to [email or web address] to contact the [facility] Attending or Consulting provider for this pt

## 2021-02-12 DIAGNOSIS — N3000 Acute cystitis without hematuria: Secondary | ICD-10-CM

## 2021-02-12 DIAGNOSIS — L8990 Pressure ulcer of unspecified site, unspecified stage: Secondary | ICD-10-CM | POA: Insufficient documentation

## 2021-02-12 MED ORDER — AMLODIPINE BESYLATE 10 MG PO TABS
10.0000 mg | ORAL_TABLET | Freq: Every day | ORAL | Status: DC
  Administered 2021-02-12 – 2021-02-17 (×6): 10 mg via ORAL
  Filled 2021-02-12 (×6): qty 1

## 2021-02-12 MED ORDER — TRAZODONE HCL 50 MG PO TABS
50.0000 mg | ORAL_TABLET | Freq: Every evening | ORAL | Status: DC | PRN
  Administered 2021-02-12 – 2021-02-17 (×3): 50 mg via ORAL
  Filled 2021-02-12 (×6): qty 1

## 2021-02-12 MED ORDER — PHENAZOPYRIDINE HCL 200 MG PO TABS
200.0000 mg | ORAL_TABLET | Freq: Once | ORAL | Status: AC
  Administered 2021-02-12: 04:00:00 200 mg via ORAL
  Filled 2021-02-12: qty 1

## 2021-02-12 MED ORDER — ALPRAZOLAM 0.5 MG PO TABS
0.5000 mg | ORAL_TABLET | Freq: Four times a day (QID) | ORAL | Status: DC | PRN
  Administered 2021-02-13 – 2021-02-18 (×10): 0.5 mg via ORAL
  Filled 2021-02-12 (×10): qty 1

## 2021-02-12 MED ORDER — HYDRALAZINE HCL 20 MG/ML IJ SOLN
10.0000 mg | Freq: Once | INTRAMUSCULAR | Status: AC
  Administered 2021-02-12: 10 mg via INTRAVENOUS
  Filled 2021-02-12: qty 1

## 2021-02-12 MED ORDER — CARBIDOPA-LEVODOPA ER 50-200 MG PO TBCR
1.0000 | EXTENDED_RELEASE_TABLET | Freq: Every day | ORAL | Status: DC
  Administered 2021-02-12 – 2021-02-18 (×6): 1 via ORAL
  Filled 2021-02-12 (×9): qty 1

## 2021-02-12 NOTE — TOC Progression Note (Signed)
Transition of Care [facility]) - Progression Note    Patient Details  Name: Terri Perez MRN: [id number] Date of Birth: 08/03/1945  Transition of Care [facility]) CM/SW [place], RN Phone Number: 02/12/2021, 12:37 PM  Clinical Narrative:   Therapy recommends Long Term Care.  Spouse does not want to speak about it at this time, states he will speak to [facility] later today regarding Long Term Care.  He would like [facility] if possible.  Will speak to husband later today or tomorrow.  TOC to follow up    Expected Discharge Plan:  (tBD) Barriers to Discharge: Continued Medical Work up  Expected Discharge Plan and Services Expected Discharge Plan:  (tBD)   Discharge Planning Services: CM Consult   Living arrangements for the past 2 months: Single Family Home                                       Social Determinants of Health (SDOH) Interventions    Readmission Risk Interventions No flowsheet data found.

## 2021-02-12 NOTE — Evaluation (Signed)
Occupational Therapy Evaluation Patient Details Name: Terri Perez MRN: [id number] DOB: November 30, 1945 Today's Date: 02/12/2021   History of Present Illness Terri Perez is a 76 y.o. female with a history of Parkinson's disease, GERD, anxiety, orthostatic hypotension dysautonomic syndrome who comes ED complaining of increased confusion and generalized weakness since yesterday.   Clinical Impression   Pt lives at home with husband who is 24/7 caregiver at baseline. He assists pt with self care tasks and functional transfers/mobility. He is not present this session but has spoke to several staff members's about pt's PLOF. This session pt is initially agreeable and performs grooming tasks with set up A and supervision. However, the longer therapist is present in room and asking pt to engage in tasks she has increased agitation and refusal. Bed mobility with min A. Pt refuses OOB activities. This therapist is familiar with pt from previous hospital admission. OT to trial pt for appropriateness of therapeutic intervention for 2 weeks. Pt with limited command following and increased agitation this session when asked to engage in therapeutic tasks.     Recommendations for follow up therapy are one component of a multi-disciplinary discharge planning process, led by the attending physician.  Recommendations may be updated based on patient status, additional functional criteria and insurance authorization.   Follow Up Recommendations  Long-term institutional care without follow-up therapy    Assistance Recommended at Discharge Frequent or constant Supervision/Assistance  Patient can return home with the following A lot of help with walking and/or transfers;A lot of help with bathing/dressing/bathroom;Assistance with cooking/housework;Assistance with feeding;Help with stairs or ramp for entrance;Assist for transportation;Direct supervision/assist for medications management;Direct supervision/assist for  financial management    Functional Status Assessment  Patient has had a recent decline in their functional status and/or demonstrates limited ability to make significant improvements in function in a reasonable and predictable amount of time  Equipment Recommendations  None recommended by OT       Precautions / Restrictions Precautions Precautions: Fall Restrictions Weight Bearing Restrictions: No      Mobility Bed Mobility Overal bed mobility: Needs Assistance Bed Mobility: Rolling Rolling: Min assist              Transfers                   General transfer comment: Pt refusal          ADL either performed or assessed with clinical judgement   ADL Overall ADL's : Needs assistance/impaired                                       General ADL Comments: Pt performs grooming with set up A and min cuing to initiate task. Pt is not agreeable to allow OT to assist or to engage in any further ADL tasks on evaluation.     Vision Patient Visual Report: No change from baseline              Pertinent Vitals/Pain Pain Assessment Pain Assessment: No/denies pain     Hand Dominance Right   Extremity/Trunk Assessment Upper Extremity Assessment Upper Extremity Assessment: Generalized weakness;Overall [facility] for tasks assessed   Lower Extremity Assessment Lower Extremity Assessment: Generalized weakness   Cervical / Trunk Assessment Cervical / Trunk Assessment: Normal   Communication Communication Communication: No difficulties   Cognition Arousal/Alertness: Awake/alert Behavior During Therapy: Agitated Overall Cognitive Status: History of cognitive impairments -  at baseline                                 General Comments: Pt is initially cooperative but becomes more agitated the longer therapist is in the room and asking pt to engage in tasks. Pt believes she is at work.                Home Living Family/patient  expects to be discharged to:: Private residence Living Arrangements: Spouse/significant other Available Help at Discharge: Available 24 hours/day Type of Home: House Home Access: [place]: One level     Bathroom Shower/Tub: [place]: Conservation officer, nature (2 wheels);BSC/3in1;Shower seat;Wheelchair - manual;Hand held shower head;Hospital bed   Additional Comments: Pt sleeps in lift chair      Prior Functioning/Environment Prior Level of Function : Needs assist       Physical Assist : Mobility (physical);ADLs (physical)     Mobility Comments: ambulates with rollator inside household ADLs Comments: Spouse assists with self care tasks        OT Problem List: Decreased strength;Decreased activity tolerance;Impaired balance (sitting and/or standing);Decreased safety awareness;Decreased cognition      OT Treatment/Interventions: Self-care/ADL training;Therapeutic exercise;Therapeutic activities;Energy conservation;Patient/family education;DME and/or AE instruction;Balance training    OT Goals(Current goals can be found in the care plan section) Acute Rehab OT Goals Patient Stated Goal: none stated OT Goal Formulation: Patient unable to participate in goal setting Time For Goal Achievement: 02/26/21 Potential to Achieve Goals: Poor ADL Goals Pt Will Perform Grooming: with supervision;sitting Pt Will Perform Lower Body Dressing: with mod assist;sit to/from stand Pt Will Transfer to Toilet: with min assist;ambulating Pt Will Perform Toileting - Clothing Manipulation and hygiene: with min assist;sit to/from stand  OT Frequency: Min 2X/week       AM-PAC OT "6 Clicks" Daily Activity     Outcome Measure Help from another person eating meals?: A Little Help from another person taking care of personal grooming?: A Little Help from another person toileting, which includes using toliet, bedpan, or urinal?: A Lot Help from another  person bathing (including washing, rinsing, drying)?: A Lot Help from another person to put on and taking off regular upper body clothing?: A Little Help from another person to put on and taking off regular lower body clothing?: A Lot 6 Click Score: 15   End of Session Nurse Communication: Mobility status  Activity Tolerance: Other (comment) (increased agitation) Patient left: in bed;with call bell/phone within reach;with bed alarm set  OT Visit Diagnosis: Unsteadiness on feet (R26.81);Muscle weakness (generalized) (M62.81);History of falling (Z91.81)                Time: 1035-1050 OT Time Calculation (min): 15 min Charges:  OT General Charges $OT Visit: 1 Visit OT Evaluation $OT Eval Moderate Complexity: 1 [address], [place], OTR/L , CBIS ascom [phone]  02/12/21, 1:11 PM

## 2021-02-12 NOTE — Progress Notes (Signed)
[facility]) Liaison note:  Request received from [place] to speak with family regarding [facility] hospice home. Liaisons spoke with patient's daughter Terri Perez and patient's husband Terri Perez outside of the room. Patient has had a steady decline and [name] states he is at a loss as to his next steps in caring patient. Liaisons were unable to clearly explain services either at home or at the hospice home as both family members were very tired and emotional.  Attending physician Dr. Ouida Sills was present for some of the conversation and is aware of families concerns. Family and Md aware that [facility] Liaison will follow tomorrow. Thank you. Flo Shanks RN, BSN, [place] [phone]

## 2021-02-12 NOTE — TOC Progression Note (Signed)
Transition of Care [facility]) - Progression Note    Patient Details  Name: Terri Perez MRN: [id number] Date of Birth: 1945-05-21  Transition of Care [facility]) CM/SW [place], RN Phone Number: 02/12/2021, 3:56 PM  Clinical Narrative:   RNCM spoke with spouse and daughter about patient's goals of care and disposition.  Offered palliative care, spouse and daughter state that they prefer to speak with hospice.  Offered choice of providers, spouse and daughter prefer [facility].  Notified Dr. Ouida Sills of conversation, she will speak with spouse and daughter later today.  [facility] hospice, Loren Racer paged and will see family to provide information for disposition.  TOC to follow.    Expected Discharge Plan:  (tBD) Barriers to Discharge: Continued Medical Work up  Expected Discharge Plan and Services Expected Discharge Plan:  (tBD)   Discharge Planning Services: CM Consult   Living arrangements for the past 2 months: Single Family Home                                       Social Determinants of Health (SDOH) Interventions    Readmission Risk Interventions No flowsheet data found.

## 2021-02-12 NOTE — Plan of Care (Signed)

## 2021-02-12 NOTE — Progress Notes (Signed)
Ch was received well by daughter but not by Pt. Ch will return, and ask colleagues to do so as well.

## 2021-02-12 NOTE — Progress Notes (Signed)
PROGRESS NOTE  Terri Perez    DOB: 01-Jun-1945, 76 y.o.  ZJI:[id number]  PCP: Jerrol Banana., MD   Code Status: DNR   DOA: 02/10/2021   LOS: 2  Brief Narrative of Current Hospitalization  Terri Perez is a 76 y.o. female with a PMH significant for Parkinson's disease, history of rheumatoid arthritis, fibromyalgia. They presented from home to the ED on 02/10/2021 with acute delirium x1 days. In the ED, it was found that they had UTI complicated with acute delirium. She was thought to meet sepsis criteria with tachycardia, reported fever, elevated LA, and urinary source of infection. They were treated with IV CTX and fluid resuscitation.  Patient was admitted to medicine service for further workup and management of sepsis as outlined in detail below.  02/12/21 -stable, improved  Assessment & Plan  Principal Problem:   Sepsis secondary to UTI [facility]) Active Problems:   Chronic kidney disease (CKD), stage III (moderate) (HCC)   Parkinson disease (HCC)   Hypokalemia   Dehydration   Acute metabolic encephalopathy   UTI (urinary tract infection)   HTN (hypertension)   Cystitis  Sepsis 2/2 complicated UTI   metabolic encephalopathy- sepsis criteria have resolved. LA normal x2. Patient is alert and oriented x2 today. Still not at baseline mentation. Urinary incontinence continues. Remains afebrile with normal HR. Ucx showing multiple species but since she has been on Abx and showing improvement, will not recollect. BxCx NGTD - continue CTX (1/18- - monitor fever curve - PT/OT for strengthening and dispo planning  Parkinson's disease   dementia- chronic, stable. Has been progressively worsening, per report from family - continue home carbidopa/levodopa - delirium precautions  H/o Orthostatic hypotension dysautonomic syndrome- chronic. - monitor Bps closely - continue fludrocortisone  Elevated blood pressures- BP elevated to 175/102H systolics. Patient showing no end  organ damage or symptoms.  - started metoprolol which patient has been on in past - added on amlodipine 10mg  which may take a couple days to become fully effective   Hypokalemia- POA. Resolved s/p repletion. K+ 3.9 yesterday - monitor and replete PRN  AKI on CKD IIIb- (resolved s/p IV fluids). baseline Cr around 1.24. Cr yesterday 1.13 - avoid nephrotoxic agents  Anxiety- chronic, stable - continue home PRN xanax  DVT prophylaxis: enoxaparin (LOVENOX) injection 40 mg Start: 02/10/21 1800   Diet:  Diet Orders (From admission, onward)     Start     Ordered   02/10/21 1526  DIET DYS 2 Room service appropriate? Yes; Fluid consistency: Thin  Diet effective now       Question Answer Comment  Room service appropriate? Yes   Fluid consistency: Thin      02/10/21 1526            Subjective 02/12/21    Pt reports no pain. She has urinary incontinence again today.   Disposition Plan & Communication  Patient status: Inpatient  Admitted From: Home Disposition: Home vs SNF Anticipated discharge date: TBD awaiting PT/OT evaluation  Family Communication: none  Consults, Procedures, Significant Events  Consultants:  none  Procedures/significant events:  None  Antimicrobials:  Anti-infectives (From admission, onward)    Start     Dose/Rate Route Frequency Ordered Stop   02/11/21 0000  cefTRIAXone (ROCEPHIN) 1 g in sodium chloride 0.9 % 100 mL IVPB        1 g 200 mL/hr over 30 Minutes Intravenous Every 24 hours 02/10/21 1430     02/10/21 1345  cefTRIAXone (ROCEPHIN)  2 g in sodium chloride 0.9 % 100 mL IVPB  Status:  Discontinued        2 g 200 mL/hr over 30 Minutes Intravenous  Once 02/10/21 1334 02/10/21 1414   02/10/21 0915  ceFEPIme (MAXIPIME) 2 g in sodium chloride 0.9 % 100 mL IVPB        2 g 200 mL/hr over 30 Minutes Intravenous  Once 02/10/21 0905 02/10/21 1017   02/10/21 0915  metroNIDAZOLE (FLAGYL) IVPB 500 mg        500 mg 100 mL/hr over 60 Minutes Intravenous   Once 02/10/21 0905 02/10/21 1057       Objective   Vitals:   02/11/21 1555 02/11/21 1946 02/12/21 0311 02/12/21 0522  BP: (!) 162/79 (!) 169/85 (!) 176/105 (!) 185/95  Pulse: 88 91 89 92  Resp: 16 20 18 20   Temp: 98.1 F (36.7 C) 98.9 F (37.2 C) 98.2 F (36.8 C) 98 F (36.7 C)  TempSrc:   Oral Oral  SpO2: 95% 94% 92% 96%  Weight:      Height:        Intake/Output Summary (Last 24 hours) at 02/12/2021 0656 Last data filed at 02/12/2021 0302 Gross per 24 hour  Intake 680 ml  Output 1800 ml  Net -1120 ml    Filed Weights   02/10/21 0903  Weight: 76 kg    Patient BMI: Body mass index is 26.24 kg/m.   Physical Exam:  General: awake, alert, NAD HEENT: atraumatic, clear conjunctiva, anicteric sclera, MMM, hearing grossly normal Respiratory: normal respiratory effort. Cardiovascular: normal S1/S2, RRR, no JVD, murmurs, quick capillary refill  Gastrointestinal: soft, NT, ND Nervous: A&O x2. no gross focal neurologic deficits, repetitive speech Extremities: moves all equally, no edema, normal tone Skin: dry, intact, normal temperature, normal color. No rashes, lesions or ulcers on exposed skin Psychiatry: pleasant. Flat affect.  Labs   I have personally reviewed following labs and imaging studies Admission on 02/10/2021  Component Date Value Ref Range Status   SARS Coronavirus 2 by RT PCR 02/10/2021 NEGATIVE  NEGATIVE Final   Influenza A by PCR 02/10/2021 NEGATIVE  NEGATIVE Final   Influenza B by PCR 02/10/2021 NEGATIVE  NEGATIVE Final   Lactic Acid, Venous 02/10/2021 2.6 (HH)  0.5 - 1.9 mmol/L Final   Lactic Acid, Venous 02/10/2021 2.6 (HH)  0.5 - 1.9 mmol/L Final   Sodium 02/10/2021 133 (L)  135 - 145 mmol/L Final   Potassium 02/10/2021 2.9 (L)  3.5 - 5.1 mmol/L Final   Chloride 02/10/2021 95 (L)  98 - 111 mmol/L Final   CO2 02/10/2021 29  22 - 32 mmol/L Final   Glucose, Bld 02/10/2021 116 (H)  70 - 99 mg/dL Final   BUN 02/10/2021 24 (H)  8 - 23 mg/dL Final    Creatinine, Ser 02/10/2021 1.62 (H)  0.44 - 1.00 mg/dL Final   Calcium 02/10/2021 9.7  8.9 - 10.3 mg/dL Final   Total Protein 02/10/2021 7.9  6.5 - 8.1 g/dL Final   Albumin 02/10/2021 4.0  3.5 - 5.0 g/dL Final   AST 02/10/2021 47 (H)  15 - 41 U/L Final   ALT 02/10/2021 14  0 - 44 U/L Final   Alkaline Phosphatase 02/10/2021 86  38 - 126 U/L Final   Total Bilirubin 02/10/2021 0.8  0.3 - 1.2 mg/dL Final   GFR, Estimated 02/10/2021 33 (L)  >60 mL/min Final   Anion gap 02/10/2021 9  5 - 15 Final   WBC 02/10/2021  10.5  4.0 - 10.5 K/uL Final   RBC 02/10/2021 4.35  3.87 - 5.11 MIL/uL Final   Hemoglobin 02/10/2021 14.0  12.0 - 15.0 g/dL Final   HCT 02/10/2021 42.5  36.0 - 46.0 % Final   MCV 02/10/2021 97.7  80.0 - 100.0 fL Final   MCH 02/10/2021 32.2  26.0 - 34.0 pg Final   MCHC 02/10/2021 32.9  30.0 - 36.0 g/dL Final   RDW 02/10/2021 13.5  11.5 - 15.5 % Final   Platelets 02/10/2021 228  150 - 400 K/uL Final   nRBC 02/10/2021 0.0  0.0 - 0.2 % Final   Neutrophils Relative % 02/10/2021 82  % Final   Neutro Abs 02/10/2021 8.5 (H)  1.7 - 7.7 K/uL Final   Lymphocytes Relative 02/10/2021 13  % Final   Lymphs Abs 02/10/2021 1.4  0.7 - 4.0 K/uL Final   Monocytes Relative 02/10/2021 5  % Final   Monocytes Absolute 02/10/2021 0.5  0.1 - 1.0 K/uL Final   Eosinophils Relative 02/10/2021 0  % Final   Eosinophils Absolute 02/10/2021 0.0  0.0 - 0.5 K/uL Final   Basophils Relative 02/10/2021 0  % Final   Basophils Absolute 02/10/2021 0.0  0.0 - 0.1 K/uL Final   Immature Granulocytes 02/10/2021 0  % Final   Abs Immature Granulocytes 02/10/2021 0.03  0.00 - 0.07 K/uL Final   Prothrombin Time 02/10/2021 13.2  11.4 - 15.2 seconds Final   INR 02/10/2021 1.0  0.8 - 1.2 Final   aPTT 02/10/2021 25  24 - 36 seconds Final   Specimen Description 02/10/2021 BLOOD BLOOD RIGHT HAND   Final   Special Requests 02/10/2021 BOTTLES DRAWN AEROBIC AND ANAEROBIC Blood Culture results may not be optimal due to an inadequate  volume of blood received in culture bottles   Final   Culture 02/10/2021    Final                   Value:NO GROWTH 2 DAYS Performed at [facility], [address] [place], [place] [address]    Report Status 02/10/2021 PENDING   Incomplete   Specimen Description 02/10/2021 BLOOD BLOOD RIGHT FOREARM   Final   Special Requests 02/10/2021 BOTTLES DRAWN AEROBIC AND ANAEROBIC Blood Culture adequate volume   Final   Culture 02/10/2021    Final                   Value:NO GROWTH 2 DAYS Performed at [facility], [address] [place], [place] [address]    Report Status 02/10/2021 PENDING   Incomplete   Color, Urine 02/10/2021 YELLOW (A)  YELLOW Final   APPearance 02/10/2021 CLOUDY (A)  CLEAR Final   Specific Gravity, Urine 02/10/2021 1.012  1.005 - 1.030 Final   pH 02/10/2021 6.0  5.0 - 8.0 Final   Glucose, UA 02/10/2021 NEGATIVE  NEGATIVE mg/dL Final   Hgb urine dipstick 02/10/2021 SMALL (A)  NEGATIVE Final   Bilirubin Urine 02/10/2021 NEGATIVE  NEGATIVE Final   Ketones, ur 02/10/2021 5 (A)  NEGATIVE mg/dL Final   Protein, ur 02/10/2021 30 (A)  NEGATIVE mg/dL Final   Nitrite 02/10/2021 NEGATIVE  NEGATIVE Final   Leukocytes,Ua 02/10/2021 LARGE (A)  NEGATIVE Final   RBC / HPF 02/10/2021 6-10  0 - 5 RBC/hpf Final   WBC, UA 02/10/2021 >50 (H)  0 - 5 WBC/hpf Final   Bacteria, UA 02/10/2021 NONE SEEN  NONE SEEN Final   Squamous Epithelial / LPF 02/10/2021 6-10  0 - 5 Final   WBC Clumps 02/10/2021 PRESENT   Final   Mucus 02/10/2021 PRESENT   Final   Specimen Description 02/10/2021    Final                   Value:URINE, RANDOM Performed at [facility], [address] [place], [place] [address]    Special Requests 02/10/2021    Final                   Value:NONE Performed at [facility], [address] [place], [place] [address]    Culture 02/10/2021 MULTIPLE SPECIES PRESENT, SUGGEST RECOLLECTION (A)   Final   Report Status 02/10/2021 02/11/2021  FINAL   Final   Magnesium 02/10/2021 1.9  1.7 - 2.4 mg/dL Final   Lactic Acid, Venous 02/10/2021 1.9  0.5 - 1.9 mmol/L Final   Lactic Acid, Venous 02/10/2021 1.8  0.5 - 1.9 mmol/L Final   Magnesium 02/11/2021 1.7  1.7 - 2.4 mg/dL Final   Prothrombin Time 02/11/2021 14.1  11.4 - 15.2 seconds Final   INR 02/11/2021 1.1  0.8 - 1.2 Final   Cortisol - AM 02/11/2021 19.3  6.7 - 22.6 ug/dL Final   Procalcitonin 02/11/2021 <0.10  ng/mL Final   WBC 02/11/2021 8.4  4.0 - 10.5 K/uL Final   RBC 02/11/2021 3.72 (L)  3.87 - 5.11 MIL/uL Final   Hemoglobin 02/11/2021 11.8 (L)  12.0 - 15.0 g/dL Final   HCT 02/11/2021 36.0  36.0 - 46.0 % Final   MCV 02/11/2021 96.8  80.0 - 100.0 fL Final   MCH 02/11/2021 31.7  26.0 - 34.0 pg Final   MCHC 02/11/2021 32.8  30.0 - 36.0 g/dL Final   RDW 02/11/2021 13.6  11.5 - 15.5 % Final   Platelets 02/11/2021 197  150 - 400 K/uL Final   nRBC 02/11/2021 0.0  0.0 - 0.2 % Final   Sodium 02/11/2021 144  135 - 145 mmol/L Final   Potassium 02/11/2021 3.9  3.5 - 5.1 mmol/L Final   Chloride 02/11/2021 110  98 - 111 mmol/L Final   CO2 02/11/2021 29  22 - 32 mmol/L Final   Glucose, Bld 02/11/2021 98  70 - 99 mg/dL Final   BUN 02/11/2021 21  8 - 23 mg/dL Final   Creatinine, Ser 02/11/2021 1.13 (H)  0.44 - 1.00 mg/dL Final   Calcium 02/11/2021 9.4  8.9 - 10.3 mg/dL Final   GFR, Estimated 02/11/2021 50 (L)  >60 mL/min Final   Anion gap 02/11/2021 5  5 - 15 Final    Imaging Studies  DG Chest Port 1 View  Result Date: 02/10/2021 CLINICAL DATA:  Concern for sepsis EXAM: PORTABLE CHEST 1 VIEW COMPARISON:  Chest x-ray dated August 21, 2020 FINDINGS: Patient is rotated to the left. Cardiac and mediastinal contours are unchanged. Mild left basilar opacities. No large pleural effusion pneumothorax. IMPRESSION: Mild left basilar opacities, likely due to atelectasis. Electronically Signed   By: Yetta Glassman M.D.   On: 02/10/2021 09:29   CT Renal Stone Study  Result Date:  02/10/2021 CLINICAL DATA:  Neurogenic bladder. EXAM: CT ABDOMEN AND PELVIS WITHOUT CONTRAST TECHNIQUE: Multidetector CT imaging of the abdomen and pelvis was performed following the standard protocol without IV contrast. RADIATION DOSE REDUCTION: This exam was performed according to the departmental dose-optimization program which includes automated exposure control, adjustment of the mA and/or kV according to patient size and/or use of iterative reconstruction technique. COMPARISON:  04/13/2020 FINDINGS: Lower chest: Heart is enlarged. Hepatobiliary: No focal abnormality in the liver on this study without intravenous contrast. There is no evidence for gallstones, gallbladder wall thickening, or pericholecystic fluid. No intrahepatic or extrahepatic biliary dilation. Pancreas: No focal mass lesion. No dilatation of the main duct. No intraparenchymal cyst. No peripancreatic edema. Spleen: No splenomegaly. No focal mass lesion. Adrenals/Urinary Tract: No adrenal nodule or mass. Tiny gastric fundal diverticulum projects in the region of the left adrenal gland. Several tiny stones in the right kidney (measuring up to 3-4 mm) are nonobstructing. No right ureteral stone. Patient is also noted to have several tiny 1-3 mm nonobstructing stones in the left kidney. No left ureteral stones. No secondary changes in the left kidney or ureter. Bladder is decompressed without stone disease. Stomach/Bowel: Stomach is nondistended. There is a tiny fundal diverticulum, stable since CT of 06/01/2010. Duodenum is normally positioned as is the ligament of Treitz. No small bowel wall thickening. No small bowel dilatation. Status post subtotal colectomy Vascular/Lymphatic: There is mild atherosclerotic calcification of the abdominal aorta without aneurysm. No abdominal lymphadenopathy. No pelvic sidewall lymphadenopathy. Reproductive: Uterus surgically absent. Vagina is distended with fluid in there is a small gas bubble in the vaginal  lumen. Sagittal imaging shows loss of fat plane between the vaginal cuff, small-bowel loops, and colon. Other: No intraperitoneal free fluid. Musculoskeletal: Status post right hip replacement bones are diffusely demineralized. Probable remote trauma in the region of the right pubic bone. IMPRESSION: 1. Bilateral nonobstructing renal stones. No ureteral or bladder stones. Bladder is decompressed. 2. Status post subtotal colectomy. 3. Vagina is distended with fluid and there is a small gas bubble in the vaginal lumen. Sagittal imaging shows loss of fat plane between the vaginal cuff, small-bowel loops, and colon. Appearance may be related to a vaginal stricture or obstruction. Colovaginal or enterovaginal fistula not excluded. 4. Cardiomegaly. 5. Aortic Atherosclerosis (ICD10-I70.0). Electronically Signed   By: Misty Stanley M.D.   On: 02/10/2021 14:47    Medications   Scheduled Meds:  carbidopa-levodopa  2 tablet Oral 5 X Daily   enoxaparin (LOVENOX) injection  40 mg Subcutaneous Q24H   fludrocortisone  100 mcg Oral Daily   lidocaine  1 patch Transdermal Q24H   metoprolol succinate  12.5 mg Oral QHS   pantoprazole  20 mg Oral Daily   No recently discontinued medications to reconcile  LOS: 2 days   Richarda Osmond, DO [facility] 02/12/2021, 6:56 AM   Available by Epic secure chat 7AM-7PM. If 7PM-7AM, please contact night-coverage Refer to [email or web address] to contact the [facility] Attending or Consulting provider for this pt

## 2021-02-12 NOTE — Progress Notes (Signed)
Per Dr Ouida Sills, ok to discontinue tele monitoring

## 2021-02-12 NOTE — Care Management Important Message (Signed)
Important Message  Patient Details  Name: Terri Perez MRN: [id number] Date of Birth: 1945-12-19   Medicare Important Message Given:  N/A - LOS <3 / Initial given by admissions     [name] 02/12/2021, 7:38 AM

## 2021-02-12 NOTE — Evaluation (Signed)
Physical Therapy Evaluation Patient Details Name: Terri Perez MRN: [id number] DOB: 12-Nov-1945 Today's Date: 02/12/2021  History of Present Illness  Terri Perez is a 76 y.o. female with a history of Parkinson's disease, GERD, anxiety, orthostatic hypotension dysautonomic syndrome who comes ED complaining of increased confusion and generalized weakness since yesterday.   Clinical Impression  Pt admitted with above diagnosis. Pt received in bed finishing up breakfast. Pt attempted to eat grits with fork however pt does not appear cognitively intact to be aware there was a plastic lid on grits thus preventing pt from successfully eating them. PT assisted pt. Requesting more time to eat. Husband present to report PLOF, DME, home lay out, etc. Husband reporting increased difficulty caring for spouse, that she typically is a household ambulator with rollator to the bathroom however spends most of the time in lift chair. Spouse assists in all ADL's/IADL's. Reports that pt needs to be able to transfer with some assist and ambulate household distances safely in order to feel confident pt can return home safely with him. PT returned 1 hour or so later with pt's husband absent. With encouragement, pt agreeable to participate minimally with PT. With [name] elevated, able to transfer to EoB. with increased time and supervision with ability to stand with modA to RW. Fair static standing appreciated with minguard however after 20 sec or so endorsing dizziness requesting to return to supine.  Seated and returned to supine with minguard relying on maxA +2 to scoot up in bed. All needs in reach. Anticipate due to recurrent falls at home, 10 reported in last year or so, need for increased physical support currently, cognitive state, pt would benefit form long term care placement. Pt to remain on caseload in attempts to improve pt mobility and reduce care giver burden if pt is to return home with spouse. Pt currently  with functional limitations due to the deficits listed below (see PT Problem List). Pt will benefit from skilled PT to increase their independence and safety with mobility to allow discharge to the venue listed below.      Recommendations for follow up therapy are one component of a multi-disciplinary discharge planning process, led by the attending physician.  Recommendations may be updated based on patient status, additional functional criteria and insurance authorization.  Follow Up Recommendations Long-term institutional care without follow-up therapy    Assistance Recommended at Discharge Frequent or constant Supervision/Assistance  Patient can return home with the following  A lot of help with walking and/or transfers;A lot of help with bathing/dressing/bathroom;Assistance with feeding;Help with stairs or ramp for entrance;Direct supervision/assist for financial management;Assistance with cooking/housework    Equipment Recommendations None recommended by PT  Recommendations for Other Services       Functional Status Assessment Patient has had a recent decline in their functional status and/or demonstrates limited ability to make significant improvements in function in a reasonable and predictable amount of time     Precautions / Restrictions Precautions Precautions: Fall Restrictions Weight Bearing Restrictions: No      Mobility  Bed Mobility Overal bed mobility: Needs Assistance Bed Mobility: Supine to Sit, Sit to Supine     Supine to sit: Min guard, HOB elevated Sit to supine: Min guard     Patient Response: Flat affect  Transfers Overall transfer level: Needs assistance Equipment used: Rolling walker (2 wheels) Transfers: Sit to/from Stand Sit to Stand: Mod assist           General transfer comment: cuing for  hand placement    Ambulation/Gait               General Gait Details: deferred due to dizziness  Stairs            Wheelchair  Mobility    Modified Rankin (Stroke Patients Only)       Balance Overall balance assessment: Needs assistance Sitting-balance support: Bilateral upper extremity supported, Feet supported Sitting balance-Leahy Scale: Fair     Standing balance support: Reliant on assistive device for balance Standing balance-Leahy Scale: Fair Standing balance comment: relies on RW for support                             Pertinent Vitals/Pain Pain Assessment Pain Assessment: No/denies pain    Home Living Family/patient expects to be discharged to:: Private residence Living Arrangements: Spouse/significant other Available Help at Discharge: Available 24 hours/day Type of Home: House Home Access: Ramped entrance       Home Layout: One level Home Equipment: Conservation officer, nature (2 wheels);BSC/3in1;Shower seat;Wheelchair - manual;Hand held shower head;Hospital bed Additional Comments: Pt sleeps in lift chair    Prior Function Prior Level of Function : Needs assist       Physical Assist : Mobility (physical);ADLs (physical)     Mobility Comments: ambulates with rollator inside household ADLs Comments: Per spouse, he assists with all ADL's     Hand Dominance   Dominant Hand: Right    Extremity/Trunk Assessment   Upper Extremity Assessment Upper Extremity Assessment: Defer to OT evaluation;Generalized weakness    Lower Extremity Assessment Lower Extremity Assessment: Generalized weakness    Cervical / Trunk Assessment Cervical / Trunk Assessment: Normal  Communication   Communication: No difficulties  Cognition Arousal/Alertness: Awake/alert Behavior During Therapy: Flat affect, Agitated Overall Cognitive Status: History of cognitive impairments - at baseline                                 General Comments: A&Ox2 to person and place        General Comments      Exercises Other Exercises Other Exercises: Role of PT in acute setting, safe use of  DME   Assessment/Plan    PT Assessment Patient needs continued PT services  PT Problem List Decreased strength;Decreased range of motion;Decreased activity tolerance;Decreased balance;Decreased safety awareness;Decreased mobility       PT Treatment Interventions DME instruction;Therapeutic exercise;Gait training;Balance training;Neuromuscular re-education;Therapeutic activities    PT Goals (Current goals can be found in the Care Plan section)  Acute Rehab PT Goals PT Goal Formulation: Patient unable to participate in goal setting    Frequency Min 2X/week     Co-evaluation               AM-PAC PT "6 Clicks" Mobility  Outcome Measure Help needed turning from your back to your side while in a flat bed without using bedrails?: A Little Help needed moving from lying on your back to sitting on the side of a flat bed without using bedrails?: A Little Help needed moving to and from a bed to a chair (including a wheelchair)?: A Lot Help needed standing up from a chair using your arms (e.g., wheelchair or bedside chair)?: A Lot Help needed to walk in hospital room?: Total Help needed climbing 3-5 steps with a railing? : Total 6 Click Score: 12    End of Session Equipment  Utilized During Treatment: Gait belt Activity Tolerance: Treatment limited secondary to medical complications (Comment) (dizziness) Patient left: in bed;with call bell/phone within reach;with bed alarm set Nurse Communication: Mobility status PT Visit Diagnosis: Unsteadiness on feet (R26.81);Other abnormalities of gait and mobility (R26.89);History of falling (Z91.81);Muscle weakness (generalized) (M62.81)    Time: 1173-5670 PT Time Calculation (min) (ACUTE ONLY): 27 min   Charges:   PT Evaluation $PT Eval Moderate Complexity: 1 Mod PT Treatments $Therapeutic Activity: 8-22 mins       Jaydalyn Demattia M. Fairly IV, PT, DPT Physical Therapist- [facility]  02/12/2021, 12:17  PM

## 2021-02-13 LAB — BASIC METABOLIC PANEL
Anion gap: 13 (ref 5–15)
BUN: 20 mg/dL (ref 8–23)
CO2: 28 mmol/L (ref 22–32)
Calcium: 9.8 mg/dL (ref 8.9–10.3)
Chloride: 100 mmol/L (ref 98–111)
Creatinine, Ser: 1.07 mg/dL — ABNORMAL HIGH (ref 0.44–1.00)
GFR, Estimated: 54 mL/min — ABNORMAL LOW (ref >60)
Glucose, Bld: 98 mg/dL (ref 70–99)
Potassium: 2.9 mmol/L — ABNORMAL LOW (ref 3.5–5.1)
Sodium: 141 mmol/L (ref 135–145)

## 2021-02-13 LAB — CBC
HCT: 36.9 % (ref 36.0–46.0)
Hemoglobin: 12.4 g/dL (ref 12.0–15.0)
MCH: 32.5 pg (ref 26.0–34.0)
MCHC: 33.6 g/dL (ref 30.0–36.0)
MCV: 96.6 fL (ref 80.0–100.0)
Platelets: 181 10*3/uL (ref 150–400)
RBC: 3.82 MIL/uL — ABNORMAL LOW (ref 3.87–5.11)
RDW: 13.5 % (ref 11.5–15.5)
WBC: 6.5 10*3/uL (ref 4.0–10.5)
nRBC: 0 % (ref 0.0–0.2)

## 2021-02-13 MED ORDER — CEPHALEXIN 500 MG PO CAPS
500.0000 mg | ORAL_CAPSULE | Freq: Three times a day (TID) | ORAL | Status: AC
  Administered 2021-02-13 – 2021-02-15 (×6): 500 mg via ORAL
  Filled 2021-02-13 (×7): qty 1

## 2021-02-13 MED ORDER — POTASSIUM CHLORIDE CRYS ER 20 MEQ PO TBCR
40.0000 meq | EXTENDED_RELEASE_TABLET | Freq: Two times a day (BID) | ORAL | Status: AC
  Administered 2021-02-13 (×2): 40 meq via ORAL
  Filled 2021-02-13 (×2): qty 2

## 2021-02-13 MED ORDER — QUETIAPINE FUMARATE 25 MG PO TABS
25.0000 mg | ORAL_TABLET | Freq: Every day | ORAL | Status: DC
  Administered 2021-02-13 – 2021-02-18 (×6): 25 mg via ORAL
  Filled 2021-02-13 (×6): qty 1

## 2021-02-13 NOTE — Progress Notes (Signed)
PIV consult: Noted pt has removed 3 PIVs in 2 days (according to IV Flowsheet). In order to preserve vessels, recommend waiting until next medication is due to place another PIV. Discussed with Velna Hatchet, RN she will have nurse place IV Team consult when medication is needed.

## 2021-02-13 NOTE — Plan of Care (Signed)

## 2021-02-13 NOTE — Progress Notes (Signed)
PROGRESS NOTE  Terri Perez    DOB: 05-Aug-1945, 76 y.o.  HKV:[id number]  PCP: Jerrol Banana., MD   Code Status: DNR   DOA: 02/10/2021   LOS: 3  Brief Narrative of Current Hospitalization  Terri Perez is a 76 y.o. female with a PMH significant for Parkinson's disease, history of rheumatoid arthritis, fibromyalgia. They presented from home to the ED on 02/10/2021 with acute delirium x1 days. In the ED, it was found that they had UTI complicated with acute delirium. She was thought to meet sepsis criteria with tachycardia, reported fever, elevated LA, and urinary source of infection. They were treated with IV CTX and fluid resuscitation.  Terri Perez was admitted to medicine service for further workup and management of sepsis as outlined in detail below.  02/13/21 -stable, appears to be at baseline  Assessment & Plan  Principal Problem:   Sepsis secondary to UTI [facility]) Active Problems:   Chronic kidney disease (CKD), stage III (moderate) (HCC)   Parkinson disease (HCC)   Hypokalemia   Dehydration   Acute metabolic encephalopathy   UTI (urinary tract infection)   HTN (hypertension)   Cystitis   Pressure injury of skin  Sepsis 2/2 complicated UTI   metabolic encephalopathy- sepsis criteria have resolved. LA normal x2. Terri Perez is alert and oriented x2 today. About at baseline mentation, per family report. Urinary incontinence continues. Remains afebrile with normal HR. Ucx showing multiple species but since she has been on Abx and showing improvement, will not recollect. BxCx NGTD - continue CTX (1/18-1/21) and transition to PO due to repeated lost IV access - monitor fever curve - PT/OT for strengthening and dispo planning  Parkinson's disease   dementia   insomnia- chronic, stable. Has been progressively worsening, per report from family. Currently pursuing LTC options.  - TOC consulted for placement, appreciate your care - continue home carbidopa/levodopa - delirium  precautions - Seroquel QHS  H/o Orthostatic hypotension dysautonomic syndrome- chronic. - monitor Bps closely - continue fludrocortisone, consider decreasing as she is bed bound and now hypertensive  Elevated blood pressures- improved. Pressures in 564-332R systolics. Terri Perez showing no end organ damage or symptoms.  - started metoprolol which Terri Perez has been on in past - added on amlodipine 10mg  which may take a couple days to become fully effective   Hypokalemia- K+ 2.9 today.  - monitor and replete PRN  AKI on CKD IIIb- (resolved s/p IV fluids). baseline Cr around 1.24. Cr 1 today - avoid nephrotoxic agents  Anxiety- chronic, stable - continue home PRN xanax  DVT prophylaxis: enoxaparin (LOVENOX) injection 40 mg Start: 02/10/21 1800   Diet:  Diet Orders (From admission, onward)     Start     Ordered   02/10/21 1526  DIET DYS 2 Room service appropriate? Yes; Fluid consistency: Thin  Diet effective now       Question Answer Comment  Room service appropriate? Yes   Fluid consistency: Thin      02/10/21 1526            Subjective 02/13/21    Pt reports no concerns today. She says her mouth is dry and requests water which I helped her drink.   Disposition Plan & Communication  Terri Perez status: Inpatient  Admitted From: Home Disposition: LTC facility Anticipated discharge date: TBD  Family Communication: husband and daughter at bedside late yesterday evening Consults, Procedures, Significant Events  Consultants:  none  Procedures/significant events:  None  Antimicrobials:  Anti-infectives (From admission, onward)  Start     Dose/Rate Route Frequency Ordered Stop   02/11/21 0000  cefTRIAXone (ROCEPHIN) 1 g in sodium chloride 0.9 % 100 mL IVPB        1 g 200 mL/hr over 30 Minutes Intravenous Every 24 hours 02/10/21 1430     02/10/21 1345  cefTRIAXone (ROCEPHIN) 2 g in sodium chloride 0.9 % 100 mL IVPB  Status:  Discontinued        2 g 200 mL/hr over 30  Minutes Intravenous  Once 02/10/21 1334 02/10/21 1414   02/10/21 0915  ceFEPIme (MAXIPIME) 2 g in sodium chloride 0.9 % 100 mL IVPB        2 g 200 mL/hr over 30 Minutes Intravenous  Once 02/10/21 0905 02/10/21 1017   02/10/21 0915  metroNIDAZOLE (FLAGYL) IVPB 500 mg        500 mg 100 mL/hr over 60 Minutes Intravenous  Once 02/10/21 0905 02/10/21 1057       Objective   Vitals:   02/12/21 0837 02/12/21 1647 02/12/21 1953 02/13/21 0303  BP: (!) 150/86 (!) 146/82 (!) 154/78 133/87  Pulse: 90 90 87 87  Resp: 17 17 19 16   Temp: 98.4 F (36.9 C) 98 F (36.7 C) 98.6 F (37 C) 99.1 F (37.3 C)  TempSrc: Oral Oral Oral Oral  SpO2: 94% 95% 95% 95%  Weight:      Height:        Intake/Output Summary (Last 24 hours) at 02/13/2021 0556 Last data filed at 02/13/2021 0521 Gross per 24 hour  Intake 103.17 ml  Output 500 ml  Net -396.83 ml    Filed Weights   02/10/21 0903  Weight: 76 kg    Terri Perez BMI: Body mass index is 26.24 kg/m.   Physical Exam:  General: awake, alert, NAD HEENT: atraumatic, clear conjunctiva, anicteric sclera, dry mucus membranes, hearing grossly normal Respiratory: normal respiratory effort. Cardiovascular: normal S1/S2, RRR, no JVD, murmurs, quick capillary refill  Gastrointestinal: soft, NT, ND Nervous: A&O x2. no gross focal neurologic deficits, normal speech Extremities: moves all equally, no edema, normal tone Skin: dry, intact, normal temperature, normal color. No rashes, lesions or ulcers on exposed skin Psychiatry: pleasant. Flat affect.  Labs   I have personally reviewed following labs and imaging studies Admission on 02/10/2021  Component Date Value Ref Range Status   SARS Coronavirus 2 by RT PCR 02/10/2021 NEGATIVE  NEGATIVE Final   Influenza A by PCR 02/10/2021 NEGATIVE  NEGATIVE Final   Influenza B by PCR 02/10/2021 NEGATIVE  NEGATIVE Final   Lactic Acid, Venous 02/10/2021 2.6 (HH)  0.5 - 1.9 mmol/L Final   Lactic Acid, Venous 02/10/2021 2.6  (HH)  0.5 - 1.9 mmol/L Final   Sodium 02/10/2021 133 (L)  135 - 145 mmol/L Final   Potassium 02/10/2021 2.9 (L)  3.5 - 5.1 mmol/L Final   Chloride 02/10/2021 95 (L)  98 - 111 mmol/L Final   CO2 02/10/2021 29  22 - 32 mmol/L Final   Glucose, Bld 02/10/2021 116 (H)  70 - 99 mg/dL Final   BUN 02/10/2021 24 (H)  8 - 23 mg/dL Final   Creatinine, Ser 02/10/2021 1.62 (H)  0.44 - 1.00 mg/dL Final   Calcium 02/10/2021 9.7  8.9 - 10.3 mg/dL Final   Total Protein 02/10/2021 7.9  6.5 - 8.1 g/dL Final   Albumin 02/10/2021 4.0  3.5 - 5.0 g/dL Final   AST 02/10/2021 47 (H)  15 - 41 U/L Final   ALT 02/10/2021  14  0 - 44 U/L Final   Alkaline Phosphatase 02/10/2021 86  38 - 126 U/L Final   Total Bilirubin 02/10/2021 0.8  0.3 - 1.2 mg/dL Final   GFR, Estimated 02/10/2021 33 (L)  >60 mL/min Final   Anion gap 02/10/2021 9  5 - 15 Final   WBC 02/10/2021 10.5  4.0 - 10.5 K/uL Final   RBC 02/10/2021 4.35  3.87 - 5.11 MIL/uL Final   Hemoglobin 02/10/2021 14.0  12.0 - 15.0 g/dL Final   HCT 02/10/2021 42.5  36.0 - 46.0 % Final   MCV 02/10/2021 97.7  80.0 - 100.0 fL Final   MCH 02/10/2021 32.2  26.0 - 34.0 pg Final   MCHC 02/10/2021 32.9  30.0 - 36.0 g/dL Final   RDW 02/10/2021 13.5  11.5 - 15.5 % Final   Platelets 02/10/2021 228  150 - 400 K/uL Final   nRBC 02/10/2021 0.0  0.0 - 0.2 % Final   Neutrophils Relative % 02/10/2021 82  % Final   Neutro Abs 02/10/2021 8.5 (H)  1.7 - 7.7 K/uL Final   Lymphocytes Relative 02/10/2021 13  % Final   Lymphs Abs 02/10/2021 1.4  0.7 - 4.0 K/uL Final   Monocytes Relative 02/10/2021 5  % Final   Monocytes Absolute 02/10/2021 0.5  0.1 - 1.0 K/uL Final   Eosinophils Relative 02/10/2021 0  % Final   Eosinophils Absolute 02/10/2021 0.0  0.0 - 0.5 K/uL Final   Basophils Relative 02/10/2021 0  % Final   Basophils Absolute 02/10/2021 0.0  0.0 - 0.1 K/uL Final   Immature Granulocytes 02/10/2021 0  % Final   Abs Immature Granulocytes 02/10/2021 0.03  0.00 - 0.07 K/uL Final    Prothrombin Time 02/10/2021 13.2  11.4 - 15.2 seconds Final   INR 02/10/2021 1.0  0.8 - 1.2 Final   aPTT 02/10/2021 25  24 - 36 seconds Final   Specimen Description 02/10/2021 BLOOD BLOOD RIGHT HAND   Final   Special Requests 02/10/2021 BOTTLES DRAWN AEROBIC AND ANAEROBIC Blood Culture results may not be optimal due to an inadequate volume of blood received in culture bottles   Final   Culture 02/10/2021    Final                   Value:NO GROWTH 2 DAYS Performed at [facility], [address] [place], [place] [address]    Report Status 02/10/2021 PENDING   Incomplete   Specimen Description 02/10/2021 BLOOD BLOOD RIGHT FOREARM   Final   Special Requests 02/10/2021 BOTTLES DRAWN AEROBIC AND ANAEROBIC Blood Culture adequate volume   Final   Culture 02/10/2021    Final                   Value:NO GROWTH 2 DAYS Performed at [facility], [address] [place], [place] [address]    Report Status 02/10/2021 PENDING   Incomplete   Color, Urine 02/10/2021 YELLOW (A)  YELLOW Final   APPearance 02/10/2021 CLOUDY (A)  CLEAR Final   Specific Gravity, Urine 02/10/2021 1.012  1.005 - 1.030 Final   pH 02/10/2021 6.0  5.0 - 8.0 Final   Glucose, UA 02/10/2021 NEGATIVE  NEGATIVE mg/dL Final   Hgb urine dipstick 02/10/2021 SMALL (A)  NEGATIVE Final   Bilirubin Urine 02/10/2021 NEGATIVE  NEGATIVE Final   Ketones, ur 02/10/2021 5 (A)  NEGATIVE mg/dL Final   Protein, ur 02/10/2021 30 (A)  NEGATIVE mg/dL Final   Nitrite 02/10/2021 NEGATIVE  NEGATIVE Final   Leukocytes,Ua 02/10/2021 LARGE (A)  NEGATIVE Final   RBC / HPF 02/10/2021 6-10  0 - 5 RBC/hpf Final   WBC, UA 02/10/2021 >50 (H)  0 - 5 WBC/hpf Final   Bacteria, UA 02/10/2021 NONE SEEN  NONE SEEN Final   Squamous Epithelial / LPF 02/10/2021 6-10  0 - 5 Final   WBC Clumps 02/10/2021 PRESENT   Final   Mucus 02/10/2021 PRESENT   Final   Specimen Description 02/10/2021    Final                   Value:URINE, RANDOM Performed at  [facility], [address] [place], [place] [address]    Special Requests 02/10/2021    Final                   Value:NONE Performed at [facility], [address] [place], [place] [address]    Culture 02/10/2021 MULTIPLE SPECIES PRESENT, SUGGEST RECOLLECTION (A)   Final   Report Status 02/10/2021 02/11/2021 FINAL   Final   Magnesium 02/10/2021 1.9  1.7 - 2.4 mg/dL Final   Lactic Acid, Venous 02/10/2021 1.9  0.5 - 1.9 mmol/L Final   Lactic Acid, Venous 02/10/2021 1.8  0.5 - 1.9 mmol/L Final   Magnesium 02/11/2021 1.7  1.7 - 2.4 mg/dL Final   Prothrombin Time 02/11/2021 14.1  11.4 - 15.2 seconds Final   INR 02/11/2021 1.1  0.8 - 1.2 Final   Cortisol - AM 02/11/2021 19.3  6.7 - 22.6 ug/dL Final   Procalcitonin 02/11/2021 <0.10  ng/mL Final   WBC 02/11/2021 8.4  4.0 - 10.5 K/uL Final   RBC 02/11/2021 3.72 (L)  3.87 - 5.11 MIL/uL Final   Hemoglobin 02/11/2021 11.8 (L)  12.0 - 15.0 g/dL Final   HCT 02/11/2021 36.0  36.0 - 46.0 % Final   MCV 02/11/2021 96.8  80.0 - 100.0 fL Final   MCH 02/11/2021 31.7  26.0 - 34.0 pg Final   MCHC 02/11/2021 32.8  30.0 - 36.0 g/dL Final   RDW 02/11/2021 13.6  11.5 - 15.5 % Final   Platelets 02/11/2021 197  150 - 400 K/uL Final   nRBC 02/11/2021 0.0  0.0 - 0.2 % Final   Sodium 02/11/2021 144  135 - 145 mmol/L Final   Potassium 02/11/2021 3.9  3.5 - 5.1 mmol/L Final   Chloride 02/11/2021 110  98 - 111 mmol/L Final   CO2 02/11/2021 29  22 - 32 mmol/L Final   Glucose, Bld 02/11/2021 98  70 - 99 mg/dL Final   BUN 02/11/2021 21  8 - 23 mg/dL Final   Creatinine, Ser 02/11/2021 1.13 (H)  0.44 - 1.00 mg/dL Final   Calcium 02/11/2021 9.4  8.9 - 10.3 mg/dL Final   GFR, Estimated 02/11/2021 50 (L)  >60 mL/min Final   Anion gap 02/11/2021 5  5 - 15 Final    Imaging Studies  No results found.  Medications   Scheduled Meds:  amLODipine  10 mg Oral Daily   carbidopa-levodopa  1 tablet Oral QHS   carbidopa-levodopa  2 tablet Oral 5 X  Daily   enoxaparin (LOVENOX) injection  40 mg Subcutaneous Q24H   fludrocortisone  100 mcg Oral Daily   lidocaine  1 patch Transdermal Q24H   metoprolol succinate  12.5 mg Oral QHS   pantoprazole  20 mg Oral Daily   No recently discontinued medications to reconcile  LOS: 3 days   Maykayla Highley  Geanie Berlin, DO [facility] 02/13/2021, 5:56 AM   Available by Epic secure chat 7AM-7PM. If 7PM-7AM, please contact night-coverage Refer to [email or web address] to contact the [facility] Attending or Consulting provider for this pt

## 2021-02-14 LAB — BASIC METABOLIC PANEL
Anion gap: 6 (ref 5–15)
BUN: 26 mg/dL — ABNORMAL HIGH (ref 8–23)
CO2: 29 mmol/L (ref 22–32)
Calcium: 9.6 mg/dL (ref 8.9–10.3)
Chloride: 107 mmol/L (ref 98–111)
Creatinine, Ser: 1.19 mg/dL — ABNORMAL HIGH (ref 0.44–1.00)
GFR, Estimated: 47 mL/min — ABNORMAL LOW (ref >60)
Glucose, Bld: 82 mg/dL (ref 70–99)
Potassium: 4.1 mmol/L (ref 3.5–5.1)
Sodium: 142 mmol/L (ref 135–145)

## 2021-02-14 NOTE — Plan of Care (Signed)

## 2021-02-14 NOTE — Progress Notes (Signed)
PROGRESS NOTE  LUKE RIGSBEE    DOB: 09-30-1945, 76 y.o.  OEU:[id number]  PCP: Jerrol Banana., MD   Code Status: DNR   DOA: 02/10/2021   LOS: 4  Brief Narrative of Current Hospitalization  Terri Perez is a 76 y.o. female with a PMH significant for Parkinson's disease, history of rheumatoid arthritis, fibromyalgia. They presented from home to the ED on 02/10/2021 with acute delirium x1 days. In the ED, it was found that they had UTI complicated with acute delirium. She was thought to meet sepsis criteria with tachycardia, reported fever, elevated LA, and urinary source of infection. They were treated with IV CTX and fluid resuscitation.  Patient was admitted to medicine service for further workup and management of sepsis as outlined in detail below.  02/14/21 -stable, appears to be at baseline  Assessment & Plan  Principal Problem:   Sepsis secondary to UTI [facility]) Active Problems:   Chronic kidney disease (CKD), stage III (moderate) (HCC)   Parkinson disease (HCC)   Hypokalemia   Dehydration   Acute metabolic encephalopathy   UTI (urinary tract infection)   HTN (hypertension)   Cystitis   Pressure injury of skin  Sepsis 2/2 complicated UTI   metabolic encephalopathy- sepsis criteria have resolved. LA normal x2. Patient is alert and oriented x2 today. About at baseline mentation, per family report. Urinary incontinence continues. Remains afebrile with normal HR. Ucx showing multiple species but since she has been on Abx and showing improvement, will not recollect. BxCx NGTD - continue CTX (1/18-1/21) and transition to PO due to repeated lost IV access  - keflex (1/21- - monitor fever curve - PT/OT for strengthening and dispo planning  Parkinson's disease   dementia   insomnia- chronic, stable. Has been progressively worsening, per report from family. Currently pursuing LTC options.  - TOC consulted for placement, appreciate your care - continue home  carbidopa/levodopa - delirium precautions - Seroquel QHS  H/o Orthostatic hypotension dysautonomic syndrome- chronic. - monitor Bps closely - continue fludrocortisone, consider decreasing as she is bed bound and now hypertensive  Elevated blood pressures- improved. Pressures in 154-008Q systolics. - started metoprolol which patient has been on in past - added on amlodipine 10mg   Hypokalemia- resolved. K+ 4.1 today s/p repletion.  - monitor and replete PRN  AKI on CKD IIIb- (resolved s/p IV fluids). baseline Cr around 1.24. Cr 1.19 today - avoid nephrotoxic agents  Anxiety- chronic, stable - continue home PRN xanax  DVT prophylaxis: enoxaparin (LOVENOX) injection 40 mg Start: 02/10/21 1800   Diet:  Diet Orders (From admission, onward)     Start     Ordered   02/10/21 1526  DIET DYS 2 Room service appropriate? Yes; Fluid consistency: Thin  Diet effective now       Question Answer Comment  Room service appropriate? Yes   Fluid consistency: Thin      02/10/21 1526            Subjective 02/14/21    Pt reports doing well today. Denies complaints.  Disposition Plan & Communication  Patient status: Inpatient  Admitted From: Home Disposition: LTC facility Anticipated discharge date: TBD  Family Communication: husband on phone Consults, Procedures, Significant Events  Consultants:  Palliative, hospice  Procedures/significant events:  None  Antimicrobials:  Anti-infectives (From admission, onward)    Start     Dose/Rate Route Frequency Ordered Stop   02/13/21 1400  cephALEXin (KEFLEX) capsule 500 mg  500 mg Oral Every 8 hours 02/13/21 0921 02/15/21 1359   02/11/21 0000  cefTRIAXone (ROCEPHIN) 1 g in sodium chloride 0.9 % 100 mL IVPB  Status:  Discontinued        1 g 200 mL/hr over 30 Minutes Intravenous Every 24 hours 02/10/21 1430 02/13/21 0921   02/10/21 1345  cefTRIAXone (ROCEPHIN) 2 g in sodium chloride 0.9 % 100 mL IVPB  Status:  Discontinued         2 g 200 mL/hr over 30 Minutes Intravenous  Once 02/10/21 1334 02/10/21 1414   02/10/21 0915  ceFEPIme (MAXIPIME) 2 g in sodium chloride 0.9 % 100 mL IVPB        2 g 200 mL/hr over 30 Minutes Intravenous  Once 02/10/21 0905 02/10/21 1017   02/10/21 0915  metroNIDAZOLE (FLAGYL) IVPB 500 mg        500 mg 100 mL/hr over 60 Minutes Intravenous  Once 02/10/21 0905 02/10/21 1057       Objective   Vitals:   02/13/21 1623 02/13/21 2029 02/14/21 0532 02/14/21 0724  BP: (!) 141/85 129/65 135/71 129/64  Pulse: 94 81 83 77  Resp: 18 16 17 20   Temp: 98.7 F (37.1 C) 98.2 F (36.8 C) 98.5 F (36.9 C) 98.2 F (36.8 C)  TempSrc: Oral Oral Oral Oral  SpO2: 95% 95% 95% 96%  Weight:      Height:        Intake/Output Summary (Last 24 hours) at 02/14/2021 0725 Last data filed at 02/14/2021 0532 Gross per 24 hour  Intake 240 ml  Output 650 ml  Net -410 ml    Filed Weights   02/10/21 0903  Weight: 76 kg    Patient BMI: Body mass index is 26.24 kg/m.   Physical Exam:  General: awake, alert, NAD HEENT: atraumatic, clear conjunctiva, anicteric sclera, dry mucus membranes, hearing grossly normal Respiratory: normal respiratory effort. Cardiovascular: normal S1/S2, RRR, no JVD, murmurs, quick capillary refill  Gastrointestinal: soft, NT, ND Nervous: A&O x2. no gross focal neurologic deficits, normal speech Extremities: moves all equally, no edema, normal tone Skin: dry, intact, normal temperature, normal color. No rashes, lesions or ulcers on exposed skin Psychiatry: pleasant. Flat affect.  Labs   I have personally reviewed following labs and imaging studies Admission on 02/10/2021  Component Date Value Ref Range Status   SARS Coronavirus 2 by RT PCR 02/10/2021 NEGATIVE  NEGATIVE Final   Influenza A by PCR 02/10/2021 NEGATIVE  NEGATIVE Final   Influenza B by PCR 02/10/2021 NEGATIVE  NEGATIVE Final   Lactic Acid, Venous 02/10/2021 2.6 (HH)  0.5 - 1.9 mmol/L Final   Lactic Acid, Venous  02/10/2021 2.6 (HH)  0.5 - 1.9 mmol/L Final   Sodium 02/10/2021 133 (L)  135 - 145 mmol/L Final   Potassium 02/10/2021 2.9 (L)  3.5 - 5.1 mmol/L Final   Chloride 02/10/2021 95 (L)  98 - 111 mmol/L Final   CO2 02/10/2021 29  22 - 32 mmol/L Final   Glucose, Bld 02/10/2021 116 (H)  70 - 99 mg/dL Final   BUN 02/10/2021 24 (H)  8 - 23 mg/dL Final   Creatinine, Ser 02/10/2021 1.62 (H)  0.44 - 1.00 mg/dL Final   Calcium 02/10/2021 9.7  8.9 - 10.3 mg/dL Final   Total Protein 02/10/2021 7.9  6.5 - 8.1 g/dL Final   Albumin 02/10/2021 4.0  3.5 - 5.0 g/dL Final   AST 02/10/2021 47 (H)  15 - 41 U/L Final  ALT 02/10/2021 14  0 - 44 U/L Final   Alkaline Phosphatase 02/10/2021 86  38 - 126 U/L Final   Total Bilirubin 02/10/2021 0.8  0.3 - 1.2 mg/dL Final   GFR, Estimated 02/10/2021 33 (L)  >60 mL/min Final   Anion gap 02/10/2021 9  5 - 15 Final   WBC 02/10/2021 10.5  4.0 - 10.5 K/uL Final   RBC 02/10/2021 4.35  3.87 - 5.11 MIL/uL Final   Hemoglobin 02/10/2021 14.0  12.0 - 15.0 g/dL Final   HCT 02/10/2021 42.5  36.0 - 46.0 % Final   MCV 02/10/2021 97.7  80.0 - 100.0 fL Final   MCH 02/10/2021 32.2  26.0 - 34.0 pg Final   MCHC 02/10/2021 32.9  30.0 - 36.0 g/dL Final   RDW 02/10/2021 13.5  11.5 - 15.5 % Final   Platelets 02/10/2021 228  150 - 400 K/uL Final   nRBC 02/10/2021 0.0  0.0 - 0.2 % Final   Neutrophils Relative % 02/10/2021 82  % Final   Neutro Abs 02/10/2021 8.5 (H)  1.7 - 7.7 K/uL Final   Lymphocytes Relative 02/10/2021 13  % Final   Lymphs Abs 02/10/2021 1.4  0.7 - 4.0 K/uL Final   Monocytes Relative 02/10/2021 5  % Final   Monocytes Absolute 02/10/2021 0.5  0.1 - 1.0 K/uL Final   Eosinophils Relative 02/10/2021 0  % Final   Eosinophils Absolute 02/10/2021 0.0  0.0 - 0.5 K/uL Final   Basophils Relative 02/10/2021 0  % Final   Basophils Absolute 02/10/2021 0.0  0.0 - 0.1 K/uL Final   Immature Granulocytes 02/10/2021 0  % Final   Abs Immature Granulocytes 02/10/2021 0.03  0.00 - 0.07  K/uL Final   Prothrombin Time 02/10/2021 13.2  11.4 - 15.2 seconds Final   INR 02/10/2021 1.0  0.8 - 1.2 Final   aPTT 02/10/2021 25  24 - 36 seconds Final   Specimen Description 02/10/2021 BLOOD BLOOD RIGHT HAND   Final   Special Requests 02/10/2021 BOTTLES DRAWN AEROBIC AND ANAEROBIC Blood Culture results may not be optimal due to an inadequate volume of blood received in culture bottles   Final   Culture 02/10/2021    Final                   Value:NO GROWTH 4 DAYS Performed at [facility], [address] [place], [place] [address]    Report Status 02/10/2021 PENDING   Incomplete   Specimen Description 02/10/2021 BLOOD BLOOD RIGHT FOREARM   Final   Special Requests 02/10/2021 BOTTLES DRAWN AEROBIC AND ANAEROBIC Blood Culture adequate volume   Final   Culture 02/10/2021    Final                   Value:NO GROWTH 4 DAYS Performed at [facility], [address] [place], [place] [address]    Report Status 02/10/2021 PENDING   Incomplete   Color, Urine 02/10/2021 YELLOW (A)  YELLOW Final   APPearance 02/10/2021 CLOUDY (A)  CLEAR Final   Specific Gravity, Urine 02/10/2021 1.012  1.005 - 1.030 Final   pH 02/10/2021 6.0  5.0 - 8.0 Final   Glucose, UA 02/10/2021 NEGATIVE  NEGATIVE mg/dL Final   Hgb urine dipstick 02/10/2021 SMALL (A)  NEGATIVE Final   Bilirubin Urine 02/10/2021 NEGATIVE  NEGATIVE Final   Ketones, ur 02/10/2021 5 (A)  NEGATIVE mg/dL Final   Protein, ur 02/10/2021 30 (A)  NEGATIVE mg/dL Final   Nitrite  02/10/2021 NEGATIVE  NEGATIVE Final   Leukocytes,Ua 02/10/2021 LARGE (A)  NEGATIVE Final   RBC / HPF 02/10/2021 6-10  0 - 5 RBC/hpf Final   WBC, UA 02/10/2021 >50 (H)  0 - 5 WBC/hpf Final   Bacteria, UA 02/10/2021 NONE SEEN  NONE SEEN Final   Squamous Epithelial / LPF 02/10/2021 6-10  0 - 5 Final   WBC Clumps 02/10/2021 PRESENT   Final   Mucus 02/10/2021 PRESENT   Final   Specimen Description 02/10/2021    Final                   Value:URINE,  RANDOM Performed at [facility], [address] [place], [place] [address]    Special Requests 02/10/2021    Final                   Value:NONE Performed at [facility], [address] [place], [place] [address]    Culture 02/10/2021 MULTIPLE SPECIES PRESENT, SUGGEST RECOLLECTION (A)   Final   Report Status 02/10/2021 02/11/2021 FINAL   Final   Magnesium 02/10/2021 1.9  1.7 - 2.4 mg/dL Final   Lactic Acid, Venous 02/10/2021 1.9  0.5 - 1.9 mmol/L Final   Lactic Acid, Venous 02/10/2021 1.8  0.5 - 1.9 mmol/L Final   Magnesium 02/11/2021 1.7  1.7 - 2.4 mg/dL Final   Prothrombin Time 02/11/2021 14.1  11.4 - 15.2 seconds Final   INR 02/11/2021 1.1  0.8 - 1.2 Final   Cortisol - AM 02/11/2021 19.3  6.7 - 22.6 ug/dL Final   Procalcitonin 02/11/2021 <0.10  ng/mL Final   WBC 02/11/2021 8.4  4.0 - 10.5 K/uL Final   RBC 02/11/2021 3.72 (L)  3.87 - 5.11 MIL/uL Final   Hemoglobin 02/11/2021 11.8 (L)  12.0 - 15.0 g/dL Final   HCT 02/11/2021 36.0  36.0 - 46.0 % Final   MCV 02/11/2021 96.8  80.0 - 100.0 fL Final   MCH 02/11/2021 31.7  26.0 - 34.0 pg Final   MCHC 02/11/2021 32.8  30.0 - 36.0 g/dL Final   RDW 02/11/2021 13.6  11.5 - 15.5 % Final   Platelets 02/11/2021 197  150 - 400 K/uL Final   nRBC 02/11/2021 0.0  0.0 - 0.2 % Final   Sodium 02/11/2021 144  135 - 145 mmol/L Final   Potassium 02/11/2021 3.9  3.5 - 5.1 mmol/L Final   Chloride 02/11/2021 110  98 - 111 mmol/L Final   CO2 02/11/2021 29  22 - 32 mmol/L Final   Glucose, Bld 02/11/2021 98  70 - 99 mg/dL Final   BUN 02/11/2021 21  8 - 23 mg/dL Final   Creatinine, Ser 02/11/2021 1.13 (H)  0.44 - 1.00 mg/dL Final   Calcium 02/11/2021 9.4  8.9 - 10.3 mg/dL Final   GFR, Estimated 02/11/2021 50 (L)  >60 mL/min Final   Anion gap 02/11/2021 5  5 - 15 Final   Sodium 02/13/2021 141  135 - 145 mmol/L Final   Potassium 02/13/2021 2.9 (L)  3.5 - 5.1 mmol/L Final   Chloride 02/13/2021 100  98 - 111 mmol/L Final   CO2  02/13/2021 28  22 - 32 mmol/L Final   Glucose, Bld 02/13/2021 98  70 - 99 mg/dL Final   BUN 02/13/2021 20  8 - 23 mg/dL Final   Creatinine, Ser 02/13/2021 1.07 (H)  0.44 - 1.00 mg/dL Final   Calcium 02/13/2021 9.8  8.9 - 10.3 mg/dL Final   GFR,  Estimated 02/13/2021 54 (L)  >60 mL/min Final   Anion gap 02/13/2021 13  5 - 15 Final   WBC 02/13/2021 6.5  4.0 - 10.5 K/uL Final   RBC 02/13/2021 3.82 (L)  3.87 - 5.11 MIL/uL Final   Hemoglobin 02/13/2021 12.4  12.0 - 15.0 g/dL Final   HCT 02/13/2021 36.9  36.0 - 46.0 % Final   MCV 02/13/2021 96.6  80.0 - 100.0 fL Final   MCH 02/13/2021 32.5  26.0 - 34.0 pg Final   MCHC 02/13/2021 33.6  30.0 - 36.0 g/dL Final   RDW 02/13/2021 13.5  11.5 - 15.5 % Final   Platelets 02/13/2021 181  150 - 400 K/uL Final   nRBC 02/13/2021 0.0  0.0 - 0.2 % Final   Sodium 02/14/2021 142  135 - 145 mmol/L Final   Potassium 02/14/2021 4.1  3.5 - 5.1 mmol/L Final   Chloride 02/14/2021 107  98 - 111 mmol/L Final   CO2 02/14/2021 29  22 - 32 mmol/L Final   Glucose, Bld 02/14/2021 82  70 - 99 mg/dL Final   BUN 02/14/2021 26 (H)  8 - 23 mg/dL Final   Creatinine, Ser 02/14/2021 1.19 (H)  0.44 - 1.00 mg/dL Final   Calcium 02/14/2021 9.6  8.9 - 10.3 mg/dL Final   GFR, Estimated 02/14/2021 47 (L)  >60 mL/min Final   Anion gap 02/14/2021 6  5 - 15 Final    Imaging Studies  No results found.  Medications   Scheduled Meds:  amLODipine  10 mg Oral Daily   carbidopa-levodopa  1 tablet Oral QHS   carbidopa-levodopa  2 tablet Oral 5 X Daily   cephALEXin  500 mg Oral Q8H   enoxaparin (LOVENOX) injection  40 mg Subcutaneous Q24H   fludrocortisone  100 mcg Oral Daily   lidocaine  1 patch Transdermal Q24H   metoprolol succinate  12.5 mg Oral QHS   pantoprazole  20 mg Oral Daily   QUEtiapine  25 mg Oral Daily   No recently discontinued medications to reconcile  LOS: 4 days   Richarda Osmond, DO [facility] 02/14/2021, 7:25 AM   Available by Epic secure chat  7AM-7PM. If 7PM-7AM, please contact night-coverage Refer to [email or web address] to contact the [facility] Attending or Consulting provider for this pt

## 2021-02-15 LAB — CULTURE, BLOOD (ROUTINE X 2)
Culture: NO GROWTH
Culture: NO GROWTH
Special Requests: ADEQUATE

## 2021-02-15 NOTE — Progress Notes (Signed)
[facility]) Hospital Liaison Note   Spoke with Dreama Saa, RN [facility] manager. Patient's husband is going to speak with his daughter prior to making a decision related to hospice services. Hospital liaison will follow up if family requests additional information related to hospice services or if family decides to proceed with referral for hospice services.    Please do not hesitate to call with any hospice related questions.    Thank you,   Bobbie "Loren Racer, [place], BSN [facility] Liaison [phone]

## 2021-02-15 NOTE — TOC Progression Note (Addendum)
Transition of Care [facility]) - Progression Note    Patient Details  Name: Terri Perez MRN: [id number] Date of Birth: 1945/11/09  Transition of Care [facility]) CM/SW [place], RN Phone Number: 02/15/2021, 11:14 AM  Clinical Narrative:   [facility] to follow up with patient's family.  They were thinking about hospice house, but would like to continue all medications at this time.  [facility] will follow up today.  Spoke with care team, will put in a consult for palliative care to assist family.  Long term care beds are available at [facility] and [facility], both facilities will look over patient's information and consider for LTC.  TOC contact information provided, TOC to follow to discharge.  Addendum 1120am:  Patient's family is distraught over death of patient's son.  Chaplain consulted Friday and today.  Financial services consulted as family would like [facility].  Expected Discharge Plan:  (tBD) Barriers to Discharge: Continued Medical Work up  Expected Discharge Plan and Services Expected Discharge Plan:  (tBD)   Discharge Planning Services: CM Consult   Living arrangements for the past 2 months: Single Family Home                                       Social Determinants of Health (SDOH) Interventions    Readmission Risk Interventions Readmission Risk Prevention Plan 02/15/2021  Transportation Screening Complete  PCP or Specialist Appt within 5-7 Days Complete  Home Care Screening Complete  Medication Review (RN CM) Complete  Some recent data might be hidden

## 2021-02-15 NOTE — Progress Notes (Signed)
PROGRESS NOTE  Terri Perez    DOB: 28-Jan-1945, 76 y.o.  NGE:[id number]  PCP: Jerrol Banana., MD   Code Status: DNR   DOA: 02/10/2021   LOS: 5  Brief Narrative of Current Hospitalization  Terri Perez is a 76 y.o. female with a PMH significant for Parkinson's disease, history of rheumatoid arthritis, fibromyalgia. They presented from home to the ED on 02/10/2021 with acute delirium x1 days. In the ED, it was found that they had UTI complicated with acute delirium. She was thought to meet sepsis criteria with tachycardia, reported fever, elevated LA, and urinary source of infection. They were treated with IV CTX and fluid resuscitation.  Patient was admitted to medicine service for further workup and management of sepsis as outlined in detail below.  02/15/21 -stable, appears to be at baseline. Discharge pending LTC placement.  Assessment & Plan  Principal Problem:   Sepsis secondary to UTI [facility]) Active Problems:   Chronic kidney disease (CKD), stage III (moderate) (HCC)   Parkinson's disease (HCC)   Hypokalemia   Dehydration   Acute metabolic encephalopathy   UTI (urinary tract infection)   HTN (hypertension)   Cystitis   Pressure injury of skin  Sepsis 2/2 complicated UTI   metabolic encephalopathy- sepsis criteria have resolved. LA normal x2. Patient is alert and oriented x2 today. About at baseline mentation, per family report. Urinary incontinence continues. Remains afebrile with normal HR. Ucx showing multiple species but since she has been on Abx and showing improvement, will not recollect. BxCx NGTD - continue CTX (1/18-1/21) and transition to PO due to repeated lost IV access  - keflex (1/21-1/23) - monitor fever curve - PT/OT for strengthening and dispo planning  Parkinson's disease   dementia   insomnia- chronic, stable. Has been progressively worsening, per report from family. Currently pursuing LTC options.  - TOC consulted for placement, appreciate  your care - continue home carbidopa/levodopa - delirium precautions - Seroquel QHS  H/o Orthostatic hypotension dysautonomic syndrome- chronic. - monitor Bps closely - continue fludrocortisone, consider decreasing as she is bed bound and now hypertensive  Elevated blood pressures- improved. Pressures in 401-027O systolics. - started metoprolol which patient has been on in past - added on amlodipine 10mg   Hypokalemia- resolved. K+ 4.1 s/p repletion.  - monitor and replete PRN  AKI on CKD IIIb- (resolved s/p IV fluids). baseline Cr around 1.24. Cr 1.19 - avoid nephrotoxic agents  Anxiety- chronic, stable - continue home PRN xanax  DVT prophylaxis: enoxaparin (LOVENOX) injection 40 mg Start: 02/10/21 1800   Diet:  Diet Orders (From admission, onward)     Start     Ordered   02/10/21 1526  DIET DYS 2 Room service appropriate? Yes; Fluid consistency: Thin  Diet effective now       Question Answer Comment  Room service appropriate? Yes   Fluid consistency: Thin      02/10/21 1526            Subjective 02/15/21    Pt reports no complaints today. Feels well overall. Assisted her with her drink.   Disposition Plan & Communication  Patient status: Inpatient  Admitted From: Home Disposition: LTC facility Anticipated discharge date: TBD  Family Communication: husband on phone Consults, Procedures, Significant Events  Consultants:  Palliative, hospice  Procedures/significant events:  None  Antimicrobials:  Anti-infectives (From admission, onward)    Start     Dose/Rate Route Frequency Ordered Stop   02/13/21 1400  cephALEXin (KEFLEX) capsule  500 mg        500 mg Oral Every 8 hours 02/13/21 0921 02/15/21 0617   02/11/21 0000  cefTRIAXone (ROCEPHIN) 1 g in sodium chloride 0.9 % 100 mL IVPB  Status:  Discontinued        1 g 200 mL/hr over 30 Minutes Intravenous Every 24 hours 02/10/21 1430 02/13/21 0921   02/10/21 1345  cefTRIAXone (ROCEPHIN) 2 g in sodium chloride  0.9 % 100 mL IVPB  Status:  Discontinued        2 g 200 mL/hr over 30 Minutes Intravenous  Once 02/10/21 1334 02/10/21 1414   02/10/21 0915  ceFEPIme (MAXIPIME) 2 g in sodium chloride 0.9 % 100 mL IVPB        2 g 200 mL/hr over 30 Minutes Intravenous  Once 02/10/21 0905 02/10/21 1017   02/10/21 0915  metroNIDAZOLE (FLAGYL) IVPB 500 mg        500 mg 100 mL/hr over 60 Minutes Intravenous  Once 02/10/21 0905 02/10/21 1057       Objective   Vitals:   02/14/21 1117 02/14/21 1712 02/15/21 0045 02/15/21 0649  BP: 121/61 (!) 111/55 124/71 (!) 143/72  Pulse: 73 78 73 84  Resp: 16 19 18 15   Temp: 98.4 F (36.9 C) 98.6 F (37 C) 98.6 F (37 C) 98.8 F (37.1 C)  TempSrc: Oral Oral Oral Oral  SpO2: 98% 95% 94% 98%  Weight:      Height:       No intake or output data in the 24 hours ending 02/15/21 0714  Filed Weights   02/10/21 0903  Weight: 76 kg    Patient BMI: Body mass index is 26.24 kg/m.   Physical Exam:  General: awake, alert, NAD HEENT: atraumatic, clear conjunctiva, anicteric sclera, dry mucus membranes, hearing grossly normal Respiratory: normal respiratory effort. Cardiovascular: normal S1/S2, RRR, no JVD, murmurs, quick capillary refill  Nervous: A&O x2. no gross focal neurologic deficits, normal speech Extremities: moves all equally, no edema, normal tone Skin: dry, intact, normal temperature, normal color. No rashes, lesions or ulcers on exposed skin Psychiatry: pleasant. Flat affect.  Labs   I have personally reviewed following labs and imaging studies Admission on 02/10/2021  Component Date Value Ref Range Status   SARS Coronavirus 2 by RT PCR 02/10/2021 NEGATIVE  NEGATIVE Final   Influenza A by PCR 02/10/2021 NEGATIVE  NEGATIVE Final   Influenza B by PCR 02/10/2021 NEGATIVE  NEGATIVE Final   Lactic Acid, Venous 02/10/2021 2.6 (HH)  0.5 - 1.9 mmol/L Final   Lactic Acid, Venous 02/10/2021 2.6 (HH)  0.5 - 1.9 mmol/L Final   Sodium 02/10/2021 133 (L)  135 - 145  mmol/L Final   Potassium 02/10/2021 2.9 (L)  3.5 - 5.1 mmol/L Final   Chloride 02/10/2021 95 (L)  98 - 111 mmol/L Final   CO2 02/10/2021 29  22 - 32 mmol/L Final   Glucose, Bld 02/10/2021 116 (H)  70 - 99 mg/dL Final   BUN 02/10/2021 24 (H)  8 - 23 mg/dL Final   Creatinine, Ser 02/10/2021 1.62 (H)  0.44 - 1.00 mg/dL Final   Calcium 02/10/2021 9.7  8.9 - 10.3 mg/dL Final   Total Protein 02/10/2021 7.9  6.5 - 8.1 g/dL Final   Albumin 02/10/2021 4.0  3.5 - 5.0 g/dL Final   AST 02/10/2021 47 (H)  15 - 41 U/L Final   ALT 02/10/2021 14  0 - 44 U/L Final   Alkaline Phosphatase 02/10/2021 86  38 - 126 U/L Final   Total Bilirubin 02/10/2021 0.8  0.3 - 1.2 mg/dL Final   GFR, Estimated 02/10/2021 33 (L)  >60 mL/min Final   Anion gap 02/10/2021 9  5 - 15 Final   WBC 02/10/2021 10.5  4.0 - 10.5 K/uL Final   RBC 02/10/2021 4.35  3.87 - 5.11 MIL/uL Final   Hemoglobin 02/10/2021 14.0  12.0 - 15.0 g/dL Final   HCT 02/10/2021 42.5  36.0 - 46.0 % Final   MCV 02/10/2021 97.7  80.0 - 100.0 fL Final   MCH 02/10/2021 32.2  26.0 - 34.0 pg Final   MCHC 02/10/2021 32.9  30.0 - 36.0 g/dL Final   RDW 02/10/2021 13.5  11.5 - 15.5 % Final   Platelets 02/10/2021 228  150 - 400 K/uL Final   nRBC 02/10/2021 0.0  0.0 - 0.2 % Final   Neutrophils Relative % 02/10/2021 82  % Final   Neutro Abs 02/10/2021 8.5 (H)  1.7 - 7.7 K/uL Final   Lymphocytes Relative 02/10/2021 13  % Final   Lymphs Abs 02/10/2021 1.4  0.7 - 4.0 K/uL Final   Monocytes Relative 02/10/2021 5  % Final   Monocytes Absolute 02/10/2021 0.5  0.1 - 1.0 K/uL Final   Eosinophils Relative 02/10/2021 0  % Final   Eosinophils Absolute 02/10/2021 0.0  0.0 - 0.5 K/uL Final   Basophils Relative 02/10/2021 0  % Final   Basophils Absolute 02/10/2021 0.0  0.0 - 0.1 K/uL Final   Immature Granulocytes 02/10/2021 0  % Final   Abs Immature Granulocytes 02/10/2021 0.03  0.00 - 0.07 K/uL Final   Prothrombin Time 02/10/2021 13.2  11.4 - 15.2 seconds Final   INR  02/10/2021 1.0  0.8 - 1.2 Final   aPTT 02/10/2021 25  24 - 36 seconds Final   Specimen Description 02/10/2021 BLOOD BLOOD RIGHT HAND   Final   Special Requests 02/10/2021 BOTTLES DRAWN AEROBIC AND ANAEROBIC Blood Culture results may not be optimal due to an inadequate volume of blood received in culture bottles   Final   Culture 02/10/2021    Final                   Value:NO GROWTH 4 DAYS Performed at [facility], [address] [place], [place] [address]    Report Status 02/10/2021 PENDING   Incomplete   Specimen Description 02/10/2021 BLOOD BLOOD RIGHT FOREARM   Final   Special Requests 02/10/2021 BOTTLES DRAWN AEROBIC AND ANAEROBIC Blood Culture adequate volume   Final   Culture 02/10/2021    Final                   Value:NO GROWTH 4 DAYS Performed at [facility], [address] [place], [place] [address]    Report Status 02/10/2021 PENDING   Incomplete   Color, Urine 02/10/2021 YELLOW (A)  YELLOW Final   APPearance 02/10/2021 CLOUDY (A)  CLEAR Final   Specific Gravity, Urine 02/10/2021 1.012  1.005 - 1.030 Final   pH 02/10/2021 6.0  5.0 - 8.0 Final   Glucose, UA 02/10/2021 NEGATIVE  NEGATIVE mg/dL Final   Hgb urine dipstick 02/10/2021 SMALL (A)  NEGATIVE Final   Bilirubin Urine 02/10/2021 NEGATIVE  NEGATIVE Final   Ketones, ur 02/10/2021 5 (A)  NEGATIVE mg/dL Final   Protein, ur 02/10/2021 30 (A)  NEGATIVE mg/dL Final   Nitrite 02/10/2021 NEGATIVE  NEGATIVE Final   Leukocytes,Ua 02/10/2021 LARGE (A)  NEGATIVE Final  RBC / HPF 02/10/2021 6-10  0 - 5 RBC/hpf Final   WBC, UA 02/10/2021 >50 (H)  0 - 5 WBC/hpf Final   Bacteria, UA 02/10/2021 NONE SEEN  NONE SEEN Final   Squamous Epithelial / LPF 02/10/2021 6-10  0 - 5 Final   WBC Clumps 02/10/2021 PRESENT   Final   Mucus 02/10/2021 PRESENT   Final   Specimen Description 02/10/2021    Final                   Value:URINE, RANDOM Performed at [facility], [address] [place], [place] [address]     Special Requests 02/10/2021    Final                   Value:NONE Performed at [facility], [address] [place], [place] [address]    Culture 02/10/2021 MULTIPLE SPECIES PRESENT, SUGGEST RECOLLECTION (A)   Final   Report Status 02/10/2021 02/11/2021 FINAL   Final   Magnesium 02/10/2021 1.9  1.7 - 2.4 mg/dL Final   Lactic Acid, Venous 02/10/2021 1.9  0.5 - 1.9 mmol/L Final   Lactic Acid, Venous 02/10/2021 1.8  0.5 - 1.9 mmol/L Final   Magnesium 02/11/2021 1.7  1.7 - 2.4 mg/dL Final   Prothrombin Time 02/11/2021 14.1  11.4 - 15.2 seconds Final   INR 02/11/2021 1.1  0.8 - 1.2 Final   Cortisol - AM 02/11/2021 19.3  6.7 - 22.6 ug/dL Final   Procalcitonin 02/11/2021 <0.10  ng/mL Final   WBC 02/11/2021 8.4  4.0 - 10.5 K/uL Final   RBC 02/11/2021 3.72 (L)  3.87 - 5.11 MIL/uL Final   Hemoglobin 02/11/2021 11.8 (L)  12.0 - 15.0 g/dL Final   HCT 02/11/2021 36.0  36.0 - 46.0 % Final   MCV 02/11/2021 96.8  80.0 - 100.0 fL Final   MCH 02/11/2021 31.7  26.0 - 34.0 pg Final   MCHC 02/11/2021 32.8  30.0 - 36.0 g/dL Final   RDW 02/11/2021 13.6  11.5 - 15.5 % Final   Platelets 02/11/2021 197  150 - 400 K/uL Final   nRBC 02/11/2021 0.0  0.0 - 0.2 % Final   Sodium 02/11/2021 144  135 - 145 mmol/L Final   Potassium 02/11/2021 3.9  3.5 - 5.1 mmol/L Final   Chloride 02/11/2021 110  98 - 111 mmol/L Final   CO2 02/11/2021 29  22 - 32 mmol/L Final   Glucose, Bld 02/11/2021 98  70 - 99 mg/dL Final   BUN 02/11/2021 21  8 - 23 mg/dL Final   Creatinine, Ser 02/11/2021 1.13 (H)  0.44 - 1.00 mg/dL Final   Calcium 02/11/2021 9.4  8.9 - 10.3 mg/dL Final   GFR, Estimated 02/11/2021 50 (L)  >60 mL/min Final   Anion gap 02/11/2021 5  5 - 15 Final   Sodium 02/13/2021 141  135 - 145 mmol/L Final   Potassium 02/13/2021 2.9 (L)  3.5 - 5.1 mmol/L Final   Chloride 02/13/2021 100  98 - 111 mmol/L Final   CO2 02/13/2021 28  22 - 32 mmol/L Final   Glucose, Bld 02/13/2021 98  70 - 99 mg/dL Final   BUN  02/13/2021 20  8 - 23 mg/dL Final   Creatinine, Ser 02/13/2021 1.07 (H)  0.44 - 1.00 mg/dL Final   Calcium 02/13/2021 9.8  8.9 - 10.3 mg/dL Final   GFR, Estimated 02/13/2021 54 (L)  >60 mL/min Final   Anion gap 02/13/2021 13  5 -  15 Final   WBC 02/13/2021 6.5  4.0 - 10.5 K/uL Final   RBC 02/13/2021 3.82 (L)  3.87 - 5.11 MIL/uL Final   Hemoglobin 02/13/2021 12.4  12.0 - 15.0 g/dL Final   HCT 02/13/2021 36.9  36.0 - 46.0 % Final   MCV 02/13/2021 96.6  80.0 - 100.0 fL Final   MCH 02/13/2021 32.5  26.0 - 34.0 pg Final   MCHC 02/13/2021 33.6  30.0 - 36.0 g/dL Final   RDW 02/13/2021 13.5  11.5 - 15.5 % Final   Platelets 02/13/2021 181  150 - 400 K/uL Final   nRBC 02/13/2021 0.0  0.0 - 0.2 % Final   Sodium 02/14/2021 142  135 - 145 mmol/L Final   Potassium 02/14/2021 4.1  3.5 - 5.1 mmol/L Final   Chloride 02/14/2021 107  98 - 111 mmol/L Final   CO2 02/14/2021 29  22 - 32 mmol/L Final   Glucose, Bld 02/14/2021 82  70 - 99 mg/dL Final   BUN 02/14/2021 26 (H)  8 - 23 mg/dL Final   Creatinine, Ser 02/14/2021 1.19 (H)  0.44 - 1.00 mg/dL Final   Calcium 02/14/2021 9.6  8.9 - 10.3 mg/dL Final   GFR, Estimated 02/14/2021 47 (L)  >60 mL/min Final   Anion gap 02/14/2021 6  5 - 15 Final    Imaging Studies  No results found.  Medications   Scheduled Meds:  amLODipine  10 mg Oral Daily   carbidopa-levodopa  1 tablet Oral QHS   carbidopa-levodopa  2 tablet Oral 5 X Daily   enoxaparin (LOVENOX) injection  40 mg Subcutaneous Q24H   fludrocortisone  100 mcg Oral Daily   lidocaine  1 patch Transdermal Q24H   metoprolol succinate  12.5 mg Oral QHS   pantoprazole  20 mg Oral Daily   QUEtiapine  25 mg Oral Daily   No recently discontinued medications to reconcile  LOS: 5 days   Richarda Osmond, DO [facility] 02/15/2021, 7:14 AM   Available by Epic secure chat 7AM-7PM. If 7PM-7AM, please contact night-coverage Refer to [email or web address] to contact the [facility] Attending or Consulting provider for  this pt

## 2021-02-15 NOTE — Progress Notes (Signed)
Caseworker suggested son could use support; he politely declined

## 2021-02-15 NOTE — Progress Notes (Signed)
PT Cancellation Note  Patient Details Name: Terri Perez MRN: [id number] DOB: [birth year]   Cancelled Treatment:    Reason Eval/Treat Not Completed: Other (comment)  Pt unavailable on attempt.  Will continue to monitor and await Hospice decision.   Chesley Noon 02/15/2021, 4:34 PM

## 2021-02-15 NOTE — NC FL2 (Signed)
[place] LEVEL OF CARE SCREENING TOOL     IDENTIFICATION  Patient Name: Terri Perez Birthdate: 11-May-1945 Sex: female Admission Date (Current Location): 02/10/2021  [place] and [place] Number:  Engineering geologist and Address:  [facility], [address], [place], [place] [address]      Provider Number: [id number]  Attending Physician Name and Address:  Richarda Osmond, MD  Relative Name and Phone Number:  Latrica, Clowers (Spouse)   [phone] (Mobile)    Current Level of Care: Hospital Recommended Level of Care: [place] Prior Approval Number:    Date Approved/Denied:   PASRR Number: [id number]  Discharge Plan: Other (Comment) (Long term Care)    Current Diagnoses: Patient Active Problem List   Diagnosis Date Noted   Pressure injury of skin 02/12/2021   Cystitis    Sepsis secondary to UTI ([place]) 02/10/2021   HTN (hypertension) 08/21/2020   HLD (hyperlipidemia) 08/21/2020   CKD (chronic kidney disease), stage IIIa 08/21/2020   Hypomagnesemia 08/21/2020   UTI (urinary tract infection) 07/30/2020   Generalized weakness 07/30/2020   B12 deficiency anemia 04/19/2020   Elevated troponin    Intractable vomiting with nausea    Hypokalemia 04/13/2020   Dehydration 04/13/2020   Syncope and collapse 54/62/7035   Acute metabolic encephalopathy 00/93/8182   AKI (acute kidney injury) ([place]) 04/13/2020   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   Fracture of sacrum ([place]) 08/04/2016   Adiposity 05/05/2016   Parkinson's disease ([place]) 04/25/2016   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   Bilateral lower extremity edema 07/23/2015   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   Chronic kidney disease (CKD), stage III (moderate) (HCC) 05/11/2015   Elevated rheumatoid factor 05/11/2015   Primary osteoarthritis of both hips 05/11/2015   Syncope  04/24/2015   History of operative procedure on hip 04/24/2015   Restless leg 04/24/2015   Osteoporosis 03/30/2015   Insomnia 03/30/2015   Skin cancer 03/30/2015   MDD (major depressive disorder) 03/30/2015   Migraine 03/30/2015   GERD (gastroesophageal reflux disease) 03/30/2015   IBS (irritable bowel syndrome) 03/30/2015   OA (osteoarthritis) 03/30/2015   Skin lesion of chest wall 07/17/2014   Closed fracture of shaft of femur ([place]) 04/01/2014   Fracture of bone adjacent to prosthesis 04/01/2014   Breast cancer ([place]) 03/07/2013   Malignant neoplasm of upper-outer quadrant of female breast ([place])    Acute anxiety 06/11/2008   CONSTIPATION, CHRONIC 06/11/2008   Fibromyalgia 06/11/2008    Orientation RESPIRATION BLADDER Height & Weight     Self, Place  Normal Incontinent Weight: 76 kg Height:  5\' 7"  (170.2 cm)  BEHAVIORAL SYMPTOMS/MOOD NEUROLOGICAL BOWEL NUTRITION STATUS      Incontinent Diet (Dys 2)  AMBULATORY STATUS COMMUNICATION OF NEEDS Skin   Total Care Verbally PU Stage and Appropriate Care (Foam dressing for pressure sore to buttocks daily) PU Stage 1 Dressing: Daily                     Personal Care Assistance Level of Assistance  Bathing, Feeding, Dressing Bathing Assistance: Maximum assistance Feeding assistance: Maximum assistance Dressing Assistance: Maximum assistance     Functional Limitations Info  Sight, Hearing, Speech Sight Info: Impaired Hearing Info: Adequate Speech Info: Adequate ([place])    [place]   (Long term care)  Contractures Contractures Info: Not present    Additional Factors Info  Code Status, Allergies Code Status Info: DNR Allergies Info: Alendronate Not Specified Intolerance Nausea Only Gi symptoms  Gluten Meal Not Specified  Nausea Only Other reaction(s): NAUSEA Other reaction(s): NAUSEA Other reaction(s): NAUSEA  Lactose Intolerance (gi) Not Specified  Diarrhea   Nsaids Not  Specified   GI upset. Other reaction(s): Other (See Comments) Pt unsure  Other Not Specified  Other (See Comments) Other reaction(s): OTHER  Oxycodone-acetaminophen Not Specified     Oxycodone-acetaminophen Not Specified  Other (See Comments) Other reaction(s): NAUSEA  Vicodin (hydrocodone-acetaminophen) Not Specified Intolerance Nausea Only, Diarrhea, Nausea And Vomiting   Wheat Bran Not Specified  Other (See Comments)   Sulfa Antibiotics Low  Rash Rash from steri strips  Tape Low Allergy           Current Medications (02/15/2021):  This is the current hospital active medication list Current Facility-Administered Medications  Medication Dose Route Frequency Provider Last Rate Last Admin   ALPRAZolam Duanne Moron) tablet 0.5 mg  0.5 mg Oral Q6H PRN Sharion Settler, NP   0.5 mg at 02/14/21 2235   amLODipine (NORVASC) tablet 10 mg  10 mg Oral Daily Richarda Osmond, MD   10 mg at 02/15/21 0924   carbidopa-levodopa (SINEMET CR) 50-200 MG per tablet controlled release 1 tablet  1 tablet Oral QHS Richarda Osmond, MD   1 tablet at 02/14/21 2234   carbidopa-levodopa (SINEMET IR) 25-100 MG per tablet immediate release 2 tablet  2 tablet Oral 5 X Daily Carrie Mew, MD   2 tablet at 02/15/21 0924   enoxaparin (LOVENOX) injection 40 mg  40 mg Subcutaneous Q24H Agbata, Tochukwu, MD   40 mg at 02/14/21 1707   fludrocortisone (FLORINEF) tablet 100 mcg  100 mcg Oral Daily Agbata, Tochukwu, MD   100 mcg at 02/15/21 0924   lidocaine (LIDODERM) 5 % 1 patch  1 patch Transdermal Q24H Agbata, Tochukwu, MD   1 patch at 02/14/21 1618   metoprolol succinate (TOPROL-XL) 24 hr tablet 12.5 mg  12.5 mg Oral QHS Doristine Mango L, MD   12.5 mg at 02/14/21 2235   ondansetron (ZOFRAN) tablet 4 mg  4 mg Oral Q6H PRN Agbata, Tochukwu, MD   4 mg at 02/12/21 1343   Or   ondansetron (ZOFRAN) injection 4 mg  4 mg Intravenous Q6H PRN Agbata, Tochukwu, MD   4 mg at 02/11/21 1622   pantoprazole (PROTONIX) EC tablet 20 mg  20  mg Oral Daily Agbata, Tochukwu, MD   20 mg at 02/15/21 0924   QUEtiapine (SEROQUEL) tablet 25 mg  25 mg Oral Daily Doristine Mango L, MD   25 mg at 02/14/21 1707   traMADol (ULTRAM) tablet 50 mg  50 mg Oral Q12H PRN Agbata, Tochukwu, MD   50 mg at 02/14/21 2234   traZODone (DESYREL) tablet 50 mg  50 mg Oral QHS PRN Sharion Settler, NP   50 mg at 02/12/21 2304     Discharge Medications: Please see discharge summary for a list of discharge medications.  Relevant Imaging Results:  Relevant Lab Results:   Additional Information SSN [id number]  Pete Pelt, RN

## 2021-02-16 DIAGNOSIS — N390 Urinary tract infection, site not specified: Secondary | ICD-10-CM

## 2021-02-16 DIAGNOSIS — A419 Sepsis, unspecified organism: Principal | ICD-10-CM

## 2021-02-16 DIAGNOSIS — Z7189 Other specified counseling: Secondary | ICD-10-CM

## 2021-02-16 MED ORDER — BACLOFEN 10 MG PO TABS
5.0000 mg | ORAL_TABLET | Freq: Once | ORAL | Status: AC
  Administered 2021-02-16: 12:00:00 5 mg via ORAL
  Filled 2021-02-16: qty 1

## 2021-02-16 NOTE — Progress Notes (Signed)
Occupational Therapy Treatment Patient Details Name: Terri Perez MRN: [id number] DOB: August 31, 1945 Today's Date: 02/16/2021   History of present illness Terri Perez is a 76 y.o. female with a history of Parkinson's disease, GERD, anxiety, orthostatic hypotension dysautonomic syndrome who comes ED complaining of increased confusion and generalized weakness since yesterday.   OT comments  Ms Case was seen for OT treatment on this date. Upon arrival to room pt reclined in bed, meal at bedside, pt stating hunger and requesting to eat. Pt initially requesting to exit bed to eat sitting EOB however upon initation pt refuses stating "thats uncomfortable" and returned to bed. Pt then requests EOB again then refuses. Pt instructed on use of bed controls to adjust position as pt repeatedly stating she is uncomfortable. Pt performs self-feeding with SETUP and cues to initiate task. Left with all needs in reach. Pt making minimal progress toward goals. Pt continues to benefit from skilled OT services to maximize return to PLOF and minimize risk of future falls, injury, caregiver burden, and readmission. Will continue to follow POC. Discharge recommendation remains appropriate.     Recommendations for follow up therapy are one component of a multi-disciplinary discharge planning process, led by the attending physician.  Recommendations may be updated based on patient status, additional functional criteria and insurance authorization.    Follow Up Recommendations  Long-term institutional care without follow-up therapy    Assistance Recommended at Discharge Frequent or constant Supervision/Assistance  Patient can return home with the following  A lot of help with walking and/or transfers;A lot of help with bathing/dressing/bathroom;Assistance with cooking/housework;Assistance with feeding;Help with stairs or ramp for entrance;Assist for transportation;Direct supervision/assist for medications  management;Direct supervision/assist for financial management   Equipment Recommendations  None recommended by OT    Recommendations for Other Services      Precautions / Restrictions Precautions Precautions: Fall Restrictions Weight Bearing Restrictions: No       Mobility Bed Mobility Overal bed mobility: Needs Assistance Bed Mobility: Supine to Sit, Sit to Supine     Supine to sit: Total assist Sit to supine: Total assist   General bed mobility comments: inititated, pt states she cannot move her legs, BLE off of bed when pt declines. repeated 2nd trial with same results                   ADL either performed or assessed with clinical judgement   ADL Overall ADL's : Needs assistance/impaired                                       General ADL Comments: Pt performs self-feeding with SETUP and cues to initiate task. Pt initially requesting to exit bed to eat sitting EOB however upon initation pt refuses stating "that uncomfortable" then requests again then refuses. Pt instructed on use of bed controls to adjust position.      Cognition Arousal/Alertness: Awake/alert Behavior During Therapy: Flat affect Overall Cognitive Status: No family/caregiver present to determine baseline cognitive functioning                                                     Pertinent Vitals/ Pain       Pain Assessment Pain Assessment: No/denies pain   Frequency  Min 2X/week        Progress Toward Goals  OT Goals(current goals can now be found in the care plan section)  Progress towards OT goals: Progressing toward goals  Acute Rehab OT Goals Patient Stated Goal: to go home OT Goal Formulation: Patient unable to participate in goal setting Time For Goal Achievement: 02/26/21 Potential to Achieve Goals: Poor ADL Goals Pt Will Perform Grooming: with supervision;sitting Pt Will Perform Lower Body Dressing: with mod assist;sit to/from  stand Pt Will Transfer to Toilet: with min assist;ambulating Pt Will Perform Toileting - Clothing Manipulation and hygiene: with min assist;sit to/from stand  Plan Discharge plan remains appropriate;Frequency remains appropriate    Co-evaluation                 AM-PAC OT "6 Clicks" Daily Activity     Outcome Measure   Help from another person eating meals?: A Little Help from another person taking care of personal grooming?: A Little Help from another person toileting, which includes using toliet, bedpan, or urinal?: A Lot Help from another person bathing (including washing, rinsing, drying)?: A Lot Help from another person to put on and taking off regular upper body clothing?: A Little Help from another person to put on and taking off regular lower body clothing?: A Lot 6 Click Score: 15    End of Session    OT Visit Diagnosis: Unsteadiness on feet (R26.81);Muscle weakness (generalized) (M62.81);History of falling (Z91.81)   Activity Tolerance  (pt self-limiting)   Patient Left in bed;with call bell/phone within reach;with bed alarm set   Nurse Communication          Time: 4315-4008 OT Time Calculation (min): 19 min  Charges: OT General Charges $OT Visit: 1 Visit OT Treatments $Self Care/Home Management : 8-22 mins  Dessie Coma, M.S. OTR/L  02/16/21, 4:56 PM  ascom [phone]

## 2021-02-16 NOTE — Progress Notes (Signed)
PROGRESS NOTE  Terri Perez    DOB: 30-Jun-1945, 76 y.o.  HEN:[id number]  PCP: Jerrol Banana., MD   Code Status: DNR   DOA: 02/10/2021   LOS: 6  Brief Narrative of Current Hospitalization  Terri Perez is a 76 y.o. female with a PMH significant for Parkinson's disease, history of rheumatoid arthritis, fibromyalgia. They presented from home to the ED on 02/10/2021 with acute delirium x1 days. In the ED, it was found that they had UTI complicated with acute delirium. She was thought to meet sepsis criteria with tachycardia, reported fever, elevated LA, and urinary source of infection. They were treated with IV CTX and fluid resuscitation.  Patient was admitted to medicine service for further workup and management of sepsis as outlined in detail below.  02/16/21 -stable, appears to be at baseline. Discharge pending LTC placement.  Assessment & Plan  Principal Problem:   Sepsis secondary to UTI [facility]) Active Problems:   Chronic kidney disease (CKD), stage III (moderate) (HCC)   Parkinson's disease (HCC)   Hypokalemia   Dehydration   Acute metabolic encephalopathy   UTI (urinary tract infection)   HTN (hypertension)   Cystitis   Pressure injury of skin  Sepsis 2/2 complicated UTI   metabolic encephalopathy- sepsis criteria have resolved. LA normal x2. Patient is alert and oriented x3 today. About at baseline mentation, per family report. Remains afebrile with normal HR. Ucx showing multiple species but since she has been on Abx and showing improvement, will not recollect. BxCx NGTD - continue CTX (1/18-1/21) and transition to PO due to repeated lost IV access  - keflex (1/21-1/23) - monitor fever curve - PT/OT for strengthening and dispo planning  Parkinson's disease   dementia   insomnia- chronic, stable. Has been progressively worsening, per report from family. Currently pursuing LTC options.  - TOC consulted for placement, appreciate your care - continue home  carbidopa/levodopa - delirium precautions - Seroquel QHS  H/o Orthostatic hypotension dysautonomic syndrome- chronic. - monitor Bps closely - continue fludrocortisone, consider decreasing as she is bed bound and now hypertensive  Elevated blood pressures- improved. Pressures in 361-443X systolics. - started metoprolol which patient has been on in past - added on amlodipine 10mg   Hypokalemia- resolved. K+ 4.1 s/p repletion.  - monitor and replete PRN  AKI on CKD IIIb- (resolved s/p IV fluids). baseline Cr around 1.24. Cr 1.19 - avoid nephrotoxic agents - BMP am  Anxiety- chronic, stable - continue home PRN xanax  DVT prophylaxis: enoxaparin (LOVENOX) injection 40 mg Start: 02/10/21 1800   Diet:  Diet Orders (From admission, onward)     Start     Ordered   02/10/21 1526  DIET DYS 2 Room service appropriate? Yes; Fluid consistency: Thin  Diet effective now       Question Answer Comment  Room service appropriate? Yes   Fluid consistency: Thin      02/10/21 1526            Subjective 02/16/21    Pt reports doing pretty well today. Complains that her legs hurt after being repositioned in bed.  Disposition Plan & Communication  Patient status: Inpatient  Admitted From: Home Disposition: LTC facility Anticipated discharge date: TBD  Family Communication: husband on phone Consults, Procedures, Significant Events  Consultants:  Palliative, hospice  Procedures/significant events:  None  Antimicrobials:  Anti-infectives (From admission, onward)    Start     Dose/Rate Route Frequency Ordered Stop   02/13/21 1400  cephALEXin (  KEFLEX) capsule 500 mg        500 mg Oral Every 8 hours 02/13/21 0921 02/15/21 0617   02/11/21 0000  cefTRIAXone (ROCEPHIN) 1 g in sodium chloride 0.9 % 100 mL IVPB  Status:  Discontinued        1 g 200 mL/hr over 30 Minutes Intravenous Every 24 hours 02/10/21 1430 02/13/21 0921   02/10/21 1345  cefTRIAXone (ROCEPHIN) 2 g in sodium chloride  0.9 % 100 mL IVPB  Status:  Discontinued        2 g 200 mL/hr over 30 Minutes Intravenous  Once 02/10/21 1334 02/10/21 1414   02/10/21 0915  ceFEPIme (MAXIPIME) 2 g in sodium chloride 0.9 % 100 mL IVPB        2 g 200 mL/hr over 30 Minutes Intravenous  Once 02/10/21 0905 02/10/21 1017   02/10/21 0915  metroNIDAZOLE (FLAGYL) IVPB 500 mg        500 mg 100 mL/hr over 60 Minutes Intravenous  Once 02/10/21 0905 02/10/21 1057       Objective   Vitals:   02/15/21 0649 02/15/21 0735 02/15/21 1149 02/15/21 1516  BP: (!) 143/72 (!) 148/83 103/62 114/61  Pulse: 84 84 70 75  Resp: 15 16 18 18   Temp: 98.8 F (37.1 C) 98 F (36.7 C) 97.7 F (36.5 C) 97.7 F (36.5 C)  TempSrc: Oral Oral Oral Oral  SpO2: 98% 95% 95% 96%  Weight:      Height:        Intake/Output Summary (Last 24 hours) at 02/16/2021 9937 Last data filed at 02/15/2021 1840 Gross per 24 hour  Intake 360 ml  Output 550 ml  Net -190 ml    Filed Weights   02/10/21 0903  Weight: 76 kg    Patient BMI: Body mass index is 26.24 kg/m.   Physical Exam:  General: awake, alert, NAD HEENT: atraumatic, clear conjunctiva, anicteric sclera, dry mucus membranes, hearing grossly normal Respiratory: normal respiratory effort. Cardiovascular: normal S1/S2, RRR, no JVD, murmurs, quick capillary refill  Nervous: A&O x3. no gross focal neurologic deficits, normal speech. Generalized weakness. Extremities: moves all equally, no edema, normal tone Skin: dry, intact, normal temperature, normal color. No rashes, lesions or ulcers on exposed skin Psychiatry: pleasant. Flat affect.  Labs   I have personally reviewed following labs and imaging studies Admission on 02/10/2021  Component Date Value Ref Range Status   SARS Coronavirus 2 by RT PCR 02/10/2021 NEGATIVE  NEGATIVE Final   Influenza A by PCR 02/10/2021 NEGATIVE  NEGATIVE Final   Influenza B by PCR 02/10/2021 NEGATIVE  NEGATIVE Final   Lactic Acid, Venous 02/10/2021 2.6 (HH)  0.5 -  1.9 mmol/L Final   Lactic Acid, Venous 02/10/2021 2.6 (HH)  0.5 - 1.9 mmol/L Final   Sodium 02/10/2021 133 (L)  135 - 145 mmol/L Final   Potassium 02/10/2021 2.9 (L)  3.5 - 5.1 mmol/L Final   Chloride 02/10/2021 95 (L)  98 - 111 mmol/L Final   CO2 02/10/2021 29  22 - 32 mmol/L Final   Glucose, Bld 02/10/2021 116 (H)  70 - 99 mg/dL Final   BUN 02/10/2021 24 (H)  8 - 23 mg/dL Final   Creatinine, Ser 02/10/2021 1.62 (H)  0.44 - 1.00 mg/dL Final   Calcium 02/10/2021 9.7  8.9 - 10.3 mg/dL Final   Total Protein 02/10/2021 7.9  6.5 - 8.1 g/dL Final   Albumin 02/10/2021 4.0  3.5 - 5.0 g/dL Final   AST 02/10/2021  47 (H)  15 - 41 U/L Final   ALT 02/10/2021 14  0 - 44 U/L Final   Alkaline Phosphatase 02/10/2021 86  38 - 126 U/L Final   Total Bilirubin 02/10/2021 0.8  0.3 - 1.2 mg/dL Final   GFR, Estimated 02/10/2021 33 (L)  >60 mL/min Final   Anion gap 02/10/2021 9  5 - 15 Final   WBC 02/10/2021 10.5  4.0 - 10.5 K/uL Final   RBC 02/10/2021 4.35  3.87 - 5.11 MIL/uL Final   Hemoglobin 02/10/2021 14.0  12.0 - 15.0 g/dL Final   HCT 02/10/2021 42.5  36.0 - 46.0 % Final   MCV 02/10/2021 97.7  80.0 - 100.0 fL Final   MCH 02/10/2021 32.2  26.0 - 34.0 pg Final   MCHC 02/10/2021 32.9  30.0 - 36.0 g/dL Final   RDW 02/10/2021 13.5  11.5 - 15.5 % Final   Platelets 02/10/2021 228  150 - 400 K/uL Final   nRBC 02/10/2021 0.0  0.0 - 0.2 % Final   Neutrophils Relative % 02/10/2021 82  % Final   Neutro Abs 02/10/2021 8.5 (H)  1.7 - 7.7 K/uL Final   Lymphocytes Relative 02/10/2021 13  % Final   Lymphs Abs 02/10/2021 1.4  0.7 - 4.0 K/uL Final   Monocytes Relative 02/10/2021 5  % Final   Monocytes Absolute 02/10/2021 0.5  0.1 - 1.0 K/uL Final   Eosinophils Relative 02/10/2021 0  % Final   Eosinophils Absolute 02/10/2021 0.0  0.0 - 0.5 K/uL Final   Basophils Relative 02/10/2021 0  % Final   Basophils Absolute 02/10/2021 0.0  0.0 - 0.1 K/uL Final   Immature Granulocytes 02/10/2021 0  % Final   Abs Immature  Granulocytes 02/10/2021 0.03  0.00 - 0.07 K/uL Final   Prothrombin Time 02/10/2021 13.2  11.4 - 15.2 seconds Final   INR 02/10/2021 1.0  0.8 - 1.2 Final   aPTT 02/10/2021 25  24 - 36 seconds Final   Specimen Description 02/10/2021 BLOOD BLOOD RIGHT HAND   Final   Special Requests 02/10/2021 BOTTLES DRAWN AEROBIC AND ANAEROBIC Blood Culture results may not be optimal due to an inadequate volume of blood received in culture bottles   Final   Culture 02/10/2021    Final                   Value:NO GROWTH 5 DAYS Performed at [facility], [address] [place], [place] [address]    Report Status 02/10/2021 02/15/2021 FINAL   Final   Specimen Description 02/10/2021 BLOOD BLOOD RIGHT FOREARM   Final   Special Requests 02/10/2021 BOTTLES DRAWN AEROBIC AND ANAEROBIC Blood Culture adequate volume   Final   Culture 02/10/2021    Final                   Value:NO GROWTH 5 DAYS Performed at [facility], [address] [place], [place] [address]    Report Status 02/10/2021 02/15/2021 FINAL   Final   Color, Urine 02/10/2021 YELLOW (A)  YELLOW Final   APPearance 02/10/2021 CLOUDY (A)  CLEAR Final   Specific Gravity, Urine 02/10/2021 1.012  1.005 - 1.030 Final   pH 02/10/2021 6.0  5.0 - 8.0 Final   Glucose, UA 02/10/2021 NEGATIVE  NEGATIVE mg/dL Final   Hgb urine dipstick 02/10/2021 SMALL (A)  NEGATIVE Final   Bilirubin Urine 02/10/2021 NEGATIVE  NEGATIVE Final   Ketones, ur 02/10/2021 5 (A)  NEGATIVE mg/dL Final  Protein, ur 02/10/2021 30 (A)  NEGATIVE mg/dL Final   Nitrite 02/10/2021 NEGATIVE  NEGATIVE Final   Leukocytes,Ua 02/10/2021 LARGE (A)  NEGATIVE Final   RBC / HPF 02/10/2021 6-10  0 - 5 RBC/hpf Final   WBC, UA 02/10/2021 >50 (H)  0 - 5 WBC/hpf Final   Bacteria, UA 02/10/2021 NONE SEEN  NONE SEEN Final   Squamous Epithelial / LPF 02/10/2021 6-10  0 - 5 Final   WBC Clumps 02/10/2021 PRESENT   Final   Mucus 02/10/2021 PRESENT   Final   Specimen Description 02/10/2021     Final                   Value:URINE, RANDOM Performed at [facility], [address], [place], [place] [address]    Special Requests 02/10/2021    Final                   Value:NONE Performed at [facility], [address] [place], [place] [address]    Culture 02/10/2021 MULTIPLE SPECIES PRESENT, SUGGEST RECOLLECTION (A)   Final   Report Status 02/10/2021 02/11/2021 FINAL   Final   Magnesium 02/10/2021 1.9  1.7 - 2.4 mg/dL Final   Lactic Acid, Venous 02/10/2021 1.9  0.5 - 1.9 mmol/L Final   Lactic Acid, Venous 02/10/2021 1.8  0.5 - 1.9 mmol/L Final   Magnesium 02/11/2021 1.7  1.7 - 2.4 mg/dL Final   Prothrombin Time 02/11/2021 14.1  11.4 - 15.2 seconds Final   INR 02/11/2021 1.1  0.8 - 1.2 Final   Cortisol - AM 02/11/2021 19.3  6.7 - 22.6 ug/dL Final   Procalcitonin 02/11/2021 <0.10  ng/mL Final   WBC 02/11/2021 8.4  4.0 - 10.5 K/uL Final   RBC 02/11/2021 3.72 (L)  3.87 - 5.11 MIL/uL Final   Hemoglobin 02/11/2021 11.8 (L)  12.0 - 15.0 g/dL Final   HCT 02/11/2021 36.0  36.0 - 46.0 % Final   MCV 02/11/2021 96.8  80.0 - 100.0 fL Final   MCH 02/11/2021 31.7  26.0 - 34.0 pg Final   MCHC 02/11/2021 32.8  30.0 - 36.0 g/dL Final   RDW 02/11/2021 13.6  11.5 - 15.5 % Final   Platelets 02/11/2021 197  150 - 400 K/uL Final   nRBC 02/11/2021 0.0  0.0 - 0.2 % Final   Sodium 02/11/2021 144  135 - 145 mmol/L Final   Potassium 02/11/2021 3.9  3.5 - 5.1 mmol/L Final   Chloride 02/11/2021 110  98 - 111 mmol/L Final   CO2 02/11/2021 29  22 - 32 mmol/L Final   Glucose, Bld 02/11/2021 98  70 - 99 mg/dL Final   BUN 02/11/2021 21  8 - 23 mg/dL Final   Creatinine, Ser 02/11/2021 1.13 (H)  0.44 - 1.00 mg/dL Final   Calcium 02/11/2021 9.4  8.9 - 10.3 mg/dL Final   GFR, Estimated 02/11/2021 50 (L)  >60 mL/min Final   Anion gap 02/11/2021 5  5 - 15 Final   Sodium 02/13/2021 141  135 - 145 mmol/L Final   Potassium 02/13/2021 2.9 (L)  3.5 - 5.1 mmol/L Final   Chloride 02/13/2021 100   98 - 111 mmol/L Final   CO2 02/13/2021 28  22 - 32 mmol/L Final   Glucose, Bld 02/13/2021 98  70 - 99 mg/dL Final   BUN 02/13/2021 20  8 - 23 mg/dL Final   Creatinine, Ser 02/13/2021 1.07 (H)  0.44 - 1.00 mg/dL Final  Calcium 02/13/2021 9.8  8.9 - 10.3 mg/dL Final   GFR, Estimated 02/13/2021 54 (L)  >60 mL/min Final   Anion gap 02/13/2021 13  5 - 15 Final   WBC 02/13/2021 6.5  4.0 - 10.5 K/uL Final   RBC 02/13/2021 3.82 (L)  3.87 - 5.11 MIL/uL Final   Hemoglobin 02/13/2021 12.4  12.0 - 15.0 g/dL Final   HCT 02/13/2021 36.9  36.0 - 46.0 % Final   MCV 02/13/2021 96.6  80.0 - 100.0 fL Final   MCH 02/13/2021 32.5  26.0 - 34.0 pg Final   MCHC 02/13/2021 33.6  30.0 - 36.0 g/dL Final   RDW 02/13/2021 13.5  11.5 - 15.5 % Final   Platelets 02/13/2021 181  150 - 400 K/uL Final   nRBC 02/13/2021 0.0  0.0 - 0.2 % Final   Sodium 02/14/2021 142  135 - 145 mmol/L Final   Potassium 02/14/2021 4.1  3.5 - 5.1 mmol/L Final   Chloride 02/14/2021 107  98 - 111 mmol/L Final   CO2 02/14/2021 29  22 - 32 mmol/L Final   Glucose, Bld 02/14/2021 82  70 - 99 mg/dL Final   BUN 02/14/2021 26 (H)  8 - 23 mg/dL Final   Creatinine, Ser 02/14/2021 1.19 (H)  0.44 - 1.00 mg/dL Final   Calcium 02/14/2021 9.6  8.9 - 10.3 mg/dL Final   GFR, Estimated 02/14/2021 47 (L)  >60 mL/min Final   Anion gap 02/14/2021 6  5 - 15 Final    Imaging Studies  No results found.  Medications   Scheduled Meds:  amLODipine  10 mg Oral Daily   carbidopa-levodopa  1 tablet Oral QHS   carbidopa-levodopa  2 tablet Oral 5 X Daily   enoxaparin (LOVENOX) injection  40 mg Subcutaneous Q24H   fludrocortisone  100 mcg Oral Daily   lidocaine  1 patch Transdermal Q24H   metoprolol succinate  12.5 mg Oral QHS   pantoprazole  20 mg Oral Daily   QUEtiapine  25 mg Oral Daily   No recently discontinued medications to reconcile  LOS: 6 days   Richarda Osmond, DO [facility] 02/16/2021, 7:02 AM   Available by Epic secure chat  7AM-7PM. If 7PM-7AM, please contact night-coverage Refer to [email or web address] to contact the [facility] Attending or Consulting provider for this pt

## 2021-02-16 NOTE — Progress Notes (Signed)
Physical Therapy Treatment Patient Details Name: Terri Perez MRN: [id number] DOB: 1945/05/14 Today's Date: 02/16/2021   History of Present Illness Terri Perez is a 76 y.o. female with a history of Parkinson's disease, GERD, anxiety, orthostatic hypotension dysautonomic syndrome who comes ED complaining of increased confusion and generalized weakness since yesterday.    PT Comments    Pt resting in bed upon PT arrival (pt's husband just leaving); pt agreeable to PT session.  Min assist with bed mobility; close SBA with sitting balance; min to mod assist to stand up to RW (intermittent assist for standing balance d/t posterior lean); and min assist to side step to L a couple feet with RW.  Limited activities d/t pt c/o dizziness (pt's BP 142/73 once laying back down in bed)--nurse notified.  Will continue to focus on strengthening and progressive functional mobility during hospitalization.    Recommendations for follow up therapy are one component of a multi-disciplinary discharge planning process, led by the attending physician.  Recommendations may be updated based on patient status, additional functional criteria and insurance authorization.  Follow Up Recommendations  Long-term institutional care without follow-up therapy     Assistance Recommended at Discharge    Patient can return home with the following A lot of help with walking and/or transfers;A lot of help with bathing/dressing/bathroom;Assistance with feeding;Help with stairs or ramp for entrance;Direct supervision/assist for financial management;Assistance with cooking/housework;Direct supervision/assist for medications management;Assist for transportation   Equipment Recommendations  Rolling walker (2 wheels);Wheelchair (measurements PT);Wheelchair cushion (measurements PT);Hospital bed;BSC/3in1    Recommendations for Other Services       Precautions / Restrictions Precautions Precautions:  Fall Restrictions Weight Bearing Restrictions: No     Mobility  Bed Mobility Overal bed mobility: Needs Assistance Bed Mobility: Rolling, Supine to Sit, Sit to Supine Rolling: Min assist (logrolling towards R side x2 trials)   Supine to sit: Min assist, HOB elevated (assist for trunk) Sit to supine: Min assist, HOB elevated (assist for LE's)   General bed mobility comments: increased time to perform    Transfers Overall transfer level: Needs assistance Equipment used: Rolling walker (2 wheels) Transfers: Sit to/from Stand Sit to Stand: Min assist, Mod assist           General transfer comment: x1 trial standing up to RW (assist to initate stand up to RW and control descent sitting)    Ambulation/Gait Ambulation/Gait assistance: Min assist Gait Distance (Feet):  (side stepping to L a couple feet) Assistive device: Rolling walker (2 wheels)   Gait velocity: decreased     General Gait Details: increased effort to take steps; limited d/t fatigue and dizziness   Stairs             Wheelchair Mobility    Modified Rankin (Stroke Patients Only)       Balance Overall balance assessment: Needs assistance Sitting-balance support: Single extremity supported, Feet supported Sitting balance-Leahy Scale: Fair Sitting balance - Comments: steady static sitting   Standing balance support: Bilateral upper extremity supported, Reliant on assistive device for balance Standing balance-Leahy Scale: Poor Standing balance comment: occasional posterior lean requiring cueing and assist to correct                            Cognition Arousal/Alertness: Awake/alert Behavior During Therapy: Flat affect Overall Cognitive Status: No family/caregiver present to determine baseline cognitive functioning  General Comments: Oriented to name and partial DOB (not year)        Exercises      General Comments General  comments (skin integrity, edema, etc.): Pt on bed pan upon PT arrival but bed linens still noted to be wet so sheets changed during session.  Nursing cleared pt for participation in physical therapy.  Pt agreeable to PT session.      Pertinent Vitals/Pain Pain Assessment Pain Assessment: No/denies pain Vitals (HR and O2 on room air) stable and WFL throughout treatment session.    Home Living                          Prior Function            PT Goals (current goals can now be found in the care plan section) Acute Rehab PT Goals PT Goal Formulation: Patient unable to participate in goal setting Progress towards PT goals: Progressing toward goals    Frequency    Min 2X/week      PT Plan Current plan remains appropriate    Co-evaluation              AM-PAC PT "6 Clicks" Mobility   Outcome Measure  Help needed turning from your back to your side while in a flat bed without using bedrails?: A Little Help needed moving from lying on your back to sitting on the side of a flat bed without using bedrails?: A Little Help needed moving to and from a bed to a chair (including a wheelchair)?: A Lot Help needed standing up from a chair using your arms (e.g., wheelchair or bedside chair)?: A Lot Help needed to walk in hospital room?: Total Help needed climbing 3-5 steps with a railing? : Total 6 Click Score: 12    End of Session Equipment Utilized During Treatment: Gait belt Activity Tolerance: Other (comment) (limited d/t c/o dizziness) Patient left: in bed;with call bell/phone within reach;with bed alarm set;Other (comment) (fall mat in place) Nurse Communication: Mobility status;Precautions;Other (comment) (pt's dizziness) PT Visit Diagnosis: Unsteadiness on feet (R26.81);Other abnormalities of gait and mobility (R26.89);History of falling (Z91.81);Muscle weakness (generalized) (M62.81)     Time: 2297-9892 PT Time Calculation (min) (ACUTE ONLY): 23  min  Charges:  $Therapeutic Activity: 23-37 mins                     Leitha Bleak, PT 02/16/21, 12:16 PM

## 2021-02-16 NOTE — TOC Progression Note (Signed)
Transition of Care [facility]) - Progression Note    Patient Details  Name: Terri Perez MRN: [id number] Date of Birth: 1945/03/23  [facility] CM/SW Tolani [place], RN Phone Number: 02/16/2021, 4:06 PM  Clinical Narrative:   Family chose [place] for [place] with hospice.  Hospice will be [facility] as well.  Spouse is in agreement with [facility].  RNCM spoke to [place] at [facility], who stated it will take at least a few days to work with finances to accept patient long term.  She will contact spouse today and start the process.  TOC to follow.    Expected Discharge Plan:  (tBD) Barriers to Discharge: Continued Medical Work up  Expected Discharge Plan and Services Expected Discharge Plan:  (tBD)   Discharge Planning Services: CM Consult   Living arrangements for the past 2 months: Single Family Home                                       Social Determinants of Health (SDOH) Interventions    Readmission Risk Interventions Readmission Risk Prevention Plan 02/15/2021  Transportation Screening Complete  PCP or Specialist Appt within 5-7 Days Complete  Home Care Screening Complete  Medication Review (RN CM) Complete  Some recent data might be hidden

## 2021-02-16 NOTE — Consult Note (Signed)
Consultation Note Date: 02/16/2021   Patient Name: Terri Perez  DOB: 19-Jul-1945  MRN: [id number]  Age / Sex: 76 y.o., female  PCP: Jerrol Banana., MD Referring Physician: Richarda Osmond, MD  Reason for Consultation: Establishing goals of care  HPI/Patient Profile: Terri Perez is a 76 y.o. female with a PMH significant for Parkinson's disease, history of rheumatoid arthritis, fibromyalgia. They presented from home to the ED on 02/10/2021 with acute delirium x1 days. In the ED, it was found that they had UTI complicated with acute delirium. She was thought to meet sepsis criteria with tachycardia, reported fever, elevated LA, and urinary source of infection. They were treated with IV CTX and fluid resuscitation  Clinical Assessment and Goals of Care: Patient is sitting in bed. She asks "who's that out there on the front porch?" Her breakfast is at bedside and looks untouched. No family at bedside.   Called to speak with husband. He states at baseline, his wife's mental status was a bit better than it is currently. Physically, she would need help getting up.   He states he is simply unable to care for her at home any longer.  We discussed her diagnoses, prognosis, GOC, EOL wishes disposition and options.  Created space and opportunity for patient  to explore thoughts and feelings regarding current medical information.   A detailed discussion was had today regarding advanced directives.  Concepts specific to code status, artifical feeding and hydration, IV antibiotics and rehospitalization were discussed.  The difference between an aggressive medical intervention path and a comfort care path was discussed.  Values and goals of care important to patient and family were attempted to be elicited.  Discussed limitations of medical interventions to prolong quality of life in some situations  and discussed the concept of human mortality.  He states at home she had a good appetite. He states here at the hospital, some days she will eat better than others. He indicates he would like her care to be focused on comfort and dignity.   Discussed the hospice facility; he states he is worried she will live longer than their time frame for keeping her, and she will need further placement. He states for this reason, he wants long term care placement with hospice to follow if they can. Most important, is he can not take her home.   SUMMARY OF RECOMMENDATIONS   Husband would like hospice to follow at [facility] facility. They do not want hospice facility because they do not want to take a chance that she could outlive the time frame for hospice facility and then have to find placement from there.     Prognosis:  < 2 weeks      Primary Diagnoses: Present on Admission:  UTI (urinary tract infection)  Sepsis secondary to UTI ([place])  Acute metabolic encephalopathy  Chronic kidney disease (CKD), stage III (moderate) (HCC)  Dehydration  HTN (hypertension)  Hypokalemia   I have reviewed the medical record, interviewed the patient and family, and  examined the patient. The following aspects are pertinent.  Past Medical History:  Diagnosis Date   Arm fracture    right forearm, d/t fall   Bowel trouble 2010   Breast cancer ([place]) 12/2009   LEFT LUMPECTOMY   Cancer ([place]) 12/2009   Left UOQ breast tumor; wide excision,sn bx and whole breast radiation. Chemotherapy done. There was only a 33m area of residual tumor remaining. All sn were negative. This was ER-positive, PR-negative, HER-2 neu not over expressed tumor. The original ultrasound size was 2.6 cm(T2).   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 On wide excision 3 mm area of residual tumor. Sentinel node negative. ER-30%,  PR-negative, HER-2 not overexpressing.   [place] disease [place] 04/[place]   Dr SManuella Ghaziis pt s DR   Personal history of chemotherapy 2012   BREAST CA   Personal history of fibromyalgia 2002   Personal history of malignant neoplasm of large intestine    Personal history of radiation therapy 2012   BREAST CA   Special screening for malignant neoplasms, colon    Syncope    a. 03/2020 Echo: EF 50-55%, Gr1 DD, nl RV fxn, triv MR.   Unspecified constipation    Social History   Socioeconomic History   Marital status: Married    Spouse name: Not on file   Number of children: Not on file   Years of education: Not on file   Highest education level: Not on file  Occupational History   Not on file  Tobacco Use   Smoking status: Never   Smokeless tobacco: Never  Substance and Sexual Activity   Alcohol use: No   Drug use: No   Sexual activity: Never  Other Topics Concern   Not on file  Social History Narrative   Not on file   Social Determinants of Health   Financial Resource Strain: Not on file  Food Insecurity: Not on file  Transportation Needs: Not on file  Physical Activity: Not on file  Stress: Not on file  Social Connections: Not on file   Family History  Problem Relation Age of Onset   Cancer Brother        colon   Cancer Other        breast and ovarian cancers, relationships not listed   Stroke Mother    Osteoporosis Mother    Depression Sister    Depression Sister    Depression Sister    Cancer Other 569      niece   Breast cancer Neg Hx    Scheduled Meds:  amLODipine  10 mg Oral Daily   carbidopa-levodopa  1 tablet Oral QHS   carbidopa-levodopa  2 tablet Oral 5 X Daily   enoxaparin (LOVENOX) injection  40 mg Subcutaneous Q24H   fludrocortisone  100 mcg Oral Daily   lidocaine  1 patch Transdermal Q24H   metoprolol succinate  12.5 mg Oral QHS   pantoprazole  20 mg Oral Daily   QUEtiapine  25 mg Oral Daily   Continuous Infusions: PRN Meds:.ALPRAZolam,  ondansetron **OR** ondansetron (ZOFRAN) IV, traMADol, traZODone Medications Prior to Admission:  Prior to Admission medications   Medication Sig Start Date End Date Taking? Authorizing Provider  ALPRAZolam (Duanne Moron 0.5 MG tablet TAKE ONE TABLET BY MOUTH EVERY 4 HOURS AS NEEDED 10/13/20  Yes GJerrol Banana, MD  carbidopa-levodopa (SINEMET CR) 50-200 MG tablet Take  1 tablet by mouth at bedtime.   Yes [provider]  carbidopa-levodopa (SINEMET IR) 25-100 MG tablet Take 2 tablets by mouth 3 (three) times daily. Patient taking differently: Take 2 tablets by mouth 5 (five) times daily. 04/15/20  Yes Sharen Hones, MD  fludrocortisone (FLORINEF) 0.1 MG tablet TAKE ONE TABLET BY MOUTH DAILY 07/30/20  Yes Jerrol Banana., MD  ondansetron (ZOFRAN-ODT) 4 MG disintegrating tablet DISSOLVE ONE TABLET BY MOUTH EVERY 8 HOURS AS NEEDED FOR NAUSEA AND VOMITING 11/16/20  Yes Jerrol Banana., MD  pantoprazole (PROTONIX) 20 MG tablet Take 1 tablet (20 mg total) by mouth daily. 09/15/20  Yes Jerrol Banana., MD  traMADol (ULTRAM) 50 MG tablet TAKE TWO TABLETS BY MOUTH EVERY 12 HOURS AS NEEDED FOR MODERATE PAIN 12/02/20  Yes Jerrol Banana., MD  lidocaine (LIDODERM) 5 % APPLY ONE PATCH TOPICALLY DAILY. REMOVE AND DISCARD PATCH WITHIN 12 HOURS OR AS DIRECTED BY MD Patient not taking: Reported on 02/10/2021 01/05/21   Jerrol Banana., MD  meclizine (ANTIVERT) 12.5 MG tablet Take 1 tablet (12.5 mg total) by mouth 3 (three) times daily. Patient not taking: Reported on 09/15/2020 08/03/20   Nolberto Hanlon, MD  metoprolol succinate (TOPROL-XL) 25 MG 24 hr tablet Take 0.5 tablets (12.5 mg total) by mouth at bedtime. Patient not taking: Reported on 09/15/2020 08/03/20   Nolberto Hanlon, MD  rOPINIRole (REQUIP) 1 MG tablet Take 4 tablets (4 mg total) by mouth at bedtime. Patient not taking: Reported on 09/15/2020 08/22/20 09/21/20  Terrilee Croak, MD   Allergies  Allergen Reactions    Alendronate Nausea Only    Gi symptoms   Gluten Meal Nausea Only    Other reaction(s): NAUSEA Other reaction(s): NAUSEA Other reaction(s): NAUSEA   Lactose Intolerance (Gi) Diarrhea   Nsaids     GI upset. Other reaction(s): Other (See Comments) Pt unsure   Other Other (See Comments)    Other reaction(s): OTHER   Oxycodone-Acetaminophen    Oxycodone-Acetaminophen Other (See Comments)    Other reaction(s): NAUSEA   Vicodin [Hydrocodone-Acetaminophen] Nausea Only, Diarrhea and Nausea And Vomiting   Wheat Bran Other (See Comments)   Sulfa Antibiotics Rash    Rash from steri strips   Tape Rash    Rash from steri strips   Review of Systems  Unable to perform ROS  Physical Exam Pulmonary:     Effort: Pulmonary effort is normal.  Neurological:     Mental Status: She is alert.    Vital Signs: BP (!) 142/73 (BP Location: Left Arm)    Pulse 84    Temp 98.8 F (37.1 C) (Oral)    Resp 16    Ht '5\' 7"'  (1.702 m)    Wt 76 kg    SpO2 97%    BMI 26.24 kg/m  Pain Scale: 0-10 POSS *See Group Information*: S-Acceptable,Sleep, easy to arouse Pain Score: 3    SpO2: SpO2: 97 % O2 Device:SpO2: 97 % O2 Flow Rate: .   IO: Intake/output summary:  Intake/Output Summary (Last 24 hours) at 02/16/2021 1343 Last data filed at 02/16/2021 0700 Gross per 24 hour  Intake 120 ml  Output 850 ml  Net -730 ml    LBM: Last BM Date: 02/15/21 Baseline Weight: Weight: 76 kg Most recent weight: Weight: 76 kg        Time In: 1:20 Time Out: 1:50 Time Total: 30 min Greater than 50%  of this time was spent counseling and coordinating  care related to the above assessment and plan.  Signed by: Asencion Gowda, NP   Please contact Palliative Medicine Team phone at [phone] for questions and concerns.  For individual provider: See Shea Evans

## 2021-02-17 LAB — BASIC METABOLIC PANEL
Anion gap: 8 (ref 5–15)
BUN: 20 mg/dL (ref 8–23)
CO2: 27 mmol/L (ref 22–32)
Calcium: 9.6 mg/dL (ref 8.9–10.3)
Chloride: 104 mmol/L (ref 98–111)
Creatinine, Ser: 0.85 mg/dL (ref 0.44–1.00)
GFR, Estimated: >60 mL/min (ref >60)
Glucose, Bld: 94 mg/dL (ref 70–99)
Potassium: 3.8 mmol/L (ref 3.5–5.1)
Sodium: 139 mmol/L (ref 135–145)

## 2021-02-17 MED ORDER — FLUDROCORTISONE ACETATE 0.1 MG PO TABS
0.0500 mg | ORAL_TABLET | Freq: Every day | ORAL | Status: DC
  Administered 2021-02-18 – 2021-02-19 (×2): 0.05 mg via ORAL
  Filled 2021-02-17 (×2): qty 0.5

## 2021-02-17 MED ORDER — SODIUM CHLORIDE 0.9 % IV SOLN
12.5000 mg | Freq: Once | INTRAVENOUS | Status: DC
  Filled 2021-02-17: qty 0.5

## 2021-02-17 NOTE — Progress Notes (Signed)
[name]    MR#:  [id number]  DATE OF BIRTH:  04-02-1945  SUBJECTIVE:   no family at bedside. Patient complained of nausea earlier.No vomiting. REVIEW OF SYSTEMS:   Review of Systems  Unable to perform ROS: Dementia  Tolerating Diet: Tolerating PT:   DRUG ALLERGIES:   Allergies  Allergen Reactions   Alendronate Nausea Only    Gi symptoms   Gluten Meal Nausea Only    Other reaction(s): NAUSEA Other reaction(s): NAUSEA Other reaction(s): NAUSEA   Lactose Intolerance (Gi) Diarrhea   Nsaids     GI upset. Other reaction(s): Other (See Comments) Pt unsure   Other Other (See Comments)    Other reaction(s): OTHER   Oxycodone-Acetaminophen    Oxycodone-Acetaminophen Other (See Comments)    Other reaction(s): NAUSEA   Vicodin [Hydrocodone-Acetaminophen] Nausea Only, Diarrhea and Nausea And Vomiting   Wheat Bran Other (See Comments)   Sulfa Antibiotics Rash    Rash from steri strips   Tape Rash    Rash from steri strips    VITALS:  Blood pressure 131/66, pulse 78, temperature 98 F (36.7 C), temperature source Oral, resp. rate 18, height 5\' 7"  (1.702 m), weight 76 kg, SpO2 94 %.  PHYSICAL EXAMINATION:   Physical Exam  GENERAL:  76 y.o.-year-old patient lying in the bed with no acute distress.  HEENT: Head atraumatic, normocephalic. Oropharynx and nasopharynx clear.  LUNGS: Normal breath sounds bilaterally, no wheezing, rales, rhonchi.  CARDIOVASCULAR: S1, S2 normal. No murmurs, rubs, or gallops.  ABDOMEN: Soft, nontender, nondistended. Bowel sounds present.  EXTREMITIES: No  edema b/l.    NEUROLOGIC: nonfocal PSYCHIATRIC:  patient is alert and awake SKIN: No obvious rash, lesion, or ulcer.   LABORATORY PANEL:  CBC Recent Labs  Lab 02/13/21 0535  WBC 6.5  HGB 12.4  HCT 36.9  PLT 181    Chemistries  Recent Labs  Lab 02/11/21 0404 02/13/21 0535 02/17/21 0508  NA 144   < > 139   K 3.9   < > 3.8  CL 110   < > 104  CO2 29   < > 27  GLUCOSE 98   < > 94  BUN 21   < > 20  CREATININE 1.13*   < > 0.85  CALCIUM 9.4   < > 9.6  MG 1.7  --   --    < > = values in this interval not displayed.   Cardiac Enzymes No results for input(s): TROPONINI in the last 168 hours. RADIOLOGY:  No results found. ASSESSMENT AND PLAN:   Terri Perez is a 76 y.o. female with a PMH significant for Parkinson's disease, history of rheumatoid arthritis, fibromyalgia. They presented from home to the ED on 02/10/2021 with acute delirium x1 days. In the ED, it was found that they had UTI complicated with acute delirium.  Sepsis 2/2 complicated UTI   metabolic encephalopathy- sepsis criteria have resolved. LA normal x2.  -- About at baseline mentation, per family report.  --Remains afebrile with normal HR.  --Ucx showing multiple species but since she has been on Abx and showing improvement, will not recollect.  --BCx NGTD - completed abx - PT/OT for strengthening and dispo planning   Parkinson's disease   dementia   insomnia- chronic, stable. Has been progressively worsening, per report from family. -- Currently pursuing LTC options.  --continue home carbidopa/levodopa --Seroquel QHS   H/o Orthostatic hypotension dysautonomic syndrome-  chronic. - monitor Bps closely - continue fludrocortisone, consider decreasing as she is bed bound    Elevated blood pressures- improved. Pressures in 010-272Z systolics. - started metoprolol which patient has been on in past   Hypokalemia- resolved. K   AKI on CKD IIIb- (resolved s/p IV fluids). baseline Cr around 1.24. Cr 1.19 - avoid nephrotoxic agents - BMP am   Anxiety- chronic, stable - continue home PRN xanax   DVT prophylaxis: enoxaparin (LOVENOX) injection 40 mg Start: 02/10/21 1800    Procedures: Family communication : none today Consults : CODE STATUS: DNR DVT Prophylaxis : Lovenox Level of care: Telemetry Medical Status  is: Inpatient  Remains inpatient appropriate because: patient is at her baseline. She is medically ready for discharge.  1/25--Per TOC [facility] is  getting patient's finances worked out. May take few days        TOTAL TIME TAKING CARE OF THIS PATIENT: 20 minutes.  >50% time spent on counselling and coordination of care  Note: This dictation was prepared with Dragon dictation along with smaller phrase technology. Any transcriptional errors that result from this process are unintentional.  Fritzi Mandes M.D    [facility]   CC: Primary care physician; Jerrol Banana., MD [name], female   DOB: November 15, 1945, 76 y.o.   MRN: [id number]

## 2021-02-17 NOTE — Progress Notes (Signed)
Physical Therapy Treatment Patient Details Name: Terri Perez MRN: [id number] DOB: April 08, 1945 Today's Date: 02/17/2021   History of Present Illness Pt is a 76 y.o. female with a history of Parkinson's disease, GERD, anxiety, orthostatic hypotension dysautonomic syndrome who admitted to the ED complaining of increased confusion and generalized weakness. MD assessment includes: Sepsis 2/2 complicated UTI, metabolic encephalopathy, elevated blood pressure, AKI on CKD, and hypokalemia.    PT Comments    Pt was pleasant and motivated to participate during the session and put forth good effort throughout. Pt required physical assistance with all functional tasks and presented with posterior instability in both sitting and standing but improved with anterior weight shifting activities.  Multiple attempts made to initiate gait with pt unable to advance either LE this session.  Pt's SpO2 and HR were both WNL during the session on room air. Will continue to follow patient while in acute care pending d/c to LTC.     Recommendations for follow up therapy are one component of a multi-disciplinary discharge planning process, led by the attending physician.  Recommendations may be updated based on patient status, additional functional criteria and insurance authorization.  Follow Up Recommendations  Long-term institutional care without follow-up therapy     Assistance Recommended at Discharge Frequent or constant Supervision/Assistance  Patient can return home with the following A lot of help with walking and/or transfers;A lot of help with bathing/dressing/bathroom;Assistance with feeding;Help with stairs or ramp for entrance;Direct supervision/assist for financial management;Assistance with cooking/housework;Direct supervision/assist for medications management;Assist for transportation   Equipment Recommendations  Rolling walker (2 wheels);Wheelchair (measurements PT);Wheelchair cushion (measurements  PT);Hospital bed;BSC/3in1    Recommendations for Other Services       Precautions / Restrictions Precautions Precautions: Fall Restrictions Weight Bearing Restrictions: No     Mobility  Bed Mobility Overal bed mobility: Needs Assistance Bed Mobility: Rolling Rolling: Min assist   Supine to sit: Mod assist Sit to supine: Max assist   General bed mobility comments: Mod to max A for BLE and trunk control    Transfers Overall transfer level: Needs assistance Equipment used: Rolling walker (2 wheels) Transfers: Sit to/from Stand Sit to Stand: Min assist, Mod assist, From elevated surface           General transfer comment: Min to mod A to initiate transfer and then to come to full upright position    Ambulation/Gait               General Gait Details: Multiple attempts made to initiate gait with pt unable to advance either LE   Stairs             Wheelchair Mobility    Modified Rankin (Stroke Patients Only)       Balance Overall balance assessment: Needs assistance Sitting-balance support: Single extremity supported, Feet supported Sitting balance-Leahy Scale: Poor Sitting balance - Comments: Occasional min A to prevent posterior LOB Postural control: Posterior lean Standing balance support: Bilateral upper extremity supported, Reliant on assistive device for balance Standing balance-Leahy Scale: Poor Standing balance comment: occasional posterior lean requiring cueing and assist to correct                            Cognition Arousal/Alertness: Awake/alert Behavior During Therapy: Flat affect Overall Cognitive Status: No family/caregiver present to determine baseline cognitive functioning  Exercises Total Joint Exercises Ankle Circles/Pumps: AROM, Strengthening, Both, 10 reps Quad Sets: Strengthening, Both, 10 reps Heel Slides: AAROM, Strengthening, Both, 10 reps Hip  ABduction/ADduction: AAROM, Strengthening, Both, 10 reps Straight Leg Raises: AAROM, Strengthening, Both, 10 reps Long Arc Quad: AROM, Strengthening, Both, 10 reps Knee Flexion: AROM, Strengthening, Both, 10 reps Anterior weight shifting in sitting to address posterior instability     General Comments        Pertinent Vitals/Pain Pain Assessment Pain Assessment: 0-10 Pain Score: 3  Pain Location: RLE Pain Descriptors / Indicators: Sore Pain Intervention(s): Repositioned, Monitored during session, Other (comment) (Pt stated she did not need pain medication)    Home Living                          Prior Function            PT Goals (current goals can now be found in the care plan section) Progress towards PT goals: PT to reassess next treatment    Frequency    Min 2X/week      PT Plan Current plan remains appropriate    Co-evaluation              AM-PAC PT "6 Clicks" Mobility   Outcome Measure  Help needed turning from your back to your side while in a flat bed without using bedrails?: A Little Help needed moving from lying on your back to sitting on the side of a flat bed without using bedrails?: A Lot Help needed moving to and from a bed to a chair (including a wheelchair)?: A Lot Help needed standing up from a chair using your arms (e.g., wheelchair or bedside chair)?: A Lot Help needed to walk in hospital room?: Total Help needed climbing 3-5 steps with a railing? : Total 6 Click Score: 11    End of Session Equipment Utilized During Treatment: Gait belt Activity Tolerance: Patient tolerated treatment well Patient left: in bed;with call bell/phone within reach;Other (comment);with nursing/sitter in room (Pt left in supine with CNA for linen change) Nurse Communication: Mobility status PT Visit Diagnosis: Unsteadiness on feet (R26.81);Other abnormalities of gait and mobility (R26.89);History of falling (Z91.81);Muscle weakness (generalized)  (M62.81)     Time: 1749-4496 PT Time Calculation (min) (ACUTE ONLY): 25 min  Charges:  $Therapeutic Exercise: 8-22 mins $Therapeutic Activity: 8-22 mins                     D. Royetta Asal PT, DPT 02/17/21, 5:33 PM

## 2021-02-18 LAB — RESP PANEL BY RT-PCR (FLU A&B, COVID) ARPGX2
Influenza A by PCR: NEGATIVE
Influenza B by PCR: NEGATIVE
SARS Coronavirus 2 by RT PCR: NEGATIVE

## 2021-02-18 NOTE — TOC Progression Note (Signed)
Transition of Care [facility]) - Progression Note    Patient Details  Name: Terri Perez MRN: [id number] Date of Birth: 1945/03/13  Transition of [facility]) CM/SW Phillips, RN Phone Number: 02/18/2021, 10:27 AM  Clinical Narrative:   As per Magda Paganini, patient can discharge to [place] with [facility] on Friday 02/19/21    Expected Discharge Plan:  (tBD) Barriers to Discharge: Continued Medical Work up  Expected Discharge Plan and Services Expected Discharge Plan:  (tBD)   Discharge Planning Services: CM Consult   Living arrangements for the past 2 months: Single Family Home                                       Social Determinants of Health (SDOH) Interventions    Readmission Risk Interventions Readmission Risk Prevention Plan 02/15/2021  Transportation Screening Complete  PCP or Specialist Appt within 5-7 Days Complete  Home Care Screening Complete  Medication Review (RN CM) Complete  Some recent data might be hidden

## 2021-02-18 NOTE — Progress Notes (Signed)
[facility] [place] NAME: Terri Perez    MR#:  [id number]  DATE OF BIRTH:  Jun 02, 1945  SUBJECTIVE:   no family at bedside. Had BM today Eating ok no nausea REVIEW OF SYSTEMS:   Review of Systems  Unable to perform ROS: Dementia  Tolerating Dietyes Tolerating PT:   DRUG ALLERGIES:   Allergies  Allergen Reactions   Alendronate Nausea Only    Gi symptoms   Gluten Meal Nausea Only    Other reaction(s): NAUSEA Other reaction(s): NAUSEA Other reaction(s): NAUSEA   Lactose Intolerance (Gi) Diarrhea   Nsaids     GI upset. Other reaction(s): Other (See Comments) Pt unsure   Other Other (See Comments)    Other reaction(s): OTHER   Oxycodone-Acetaminophen    Oxycodone-Acetaminophen Other (See Comments)    Other reaction(s): NAUSEA   Vicodin [Hydrocodone-Acetaminophen] Nausea Only, Diarrhea and Nausea And Vomiting   Wheat Bran Other (See Comments)   Sulfa Antibiotics Rash    Rash from steri strips   Tape Rash    Rash from steri strips    VITALS:  Blood pressure (!) 99/48, pulse 79, temperature 98.8 F (37.1 C), resp. rate 16, height 5\' 7"  (1.702 m), weight 76 kg, SpO2 93 %.  PHYSICAL EXAMINATION:   Physical Exam  GENERAL:  76 y.o.-year-old patient lying in the bed with no acute distress.  LUNGS: Normal breath sounds bilaterally, no wheezing, rales, rhonchi.  CARDIOVASCULAR: S1, S2 normal. No murmurs, rubs, or gallops.  ABDOMEN: Soft, nontender, nondistended. Bowel sounds present.  EXTREMITIES: No  edema b/l.    NEUROLOGIC: nonfocal PSYCHIATRIC:  patient is alert and awake SKIN: No obvious rash, lesion, or ulcer.   LABORATORY PANEL:  CBC Recent Labs  Lab 02/13/21 0535  WBC 6.5  HGB 12.4  HCT 36.9  PLT 181     Chemistries  Recent Labs  Lab 02/17/21 0508  NA 139  K 3.8  CL 104  CO2 27  GLUCOSE 94  BUN 20  CREATININE 0.85  CALCIUM 9.6    Cardiac Enzymes No results for input(s): TROPONINI in the  last 168 hours. RADIOLOGY:  No results found. ASSESSMENT AND PLAN:   Terri Perez is a 76 y.o. female with a PMH significant for Parkinson's disease, history of rheumatoid arthritis, fibromyalgia. They presented from home to the ED on 02/10/2021 with acute delirium x1 days. In the ED, it was found that they had UTI complicated with acute delirium.  Sepsis 2/2 complicated UTI   metabolic encephalopathy- sepsis criteria have resolved. LA normal x2.  -- About at baseline mentation, per family report.  --Remains afebrile with normal HR.  --Ucx showing multiple species but since she has been on Abx and showing improvement, will not recollect.  --BCx NGTD - completed abx - PT/OT for strengthening and dispo planning   Parkinson's disease   dementia   insomnia- chronic, stable. Has been progressively worsening, per report from family. -- Currently pursuing LTC options.  --continue home carbidopa/levodopa --Seroquel QHS   H/o Orthostatic hypotension dysautonomic syndrome- chronic. - monitor Bps closely - continue fludrocortisone, consider decreasing as she is bed bound    Elevated blood pressures- improved. Pressures in 509-326Z systolics. - started metoprolol which patient has been on in past   Hypokalemia- resolved. K   AKI on CKD IIIb- (resolved s/p IV fluids). baseline Cr around 1.24. Cr 1.19 - avoid nephrotoxic agents - BMP am   Anxiety- chronic, stable - continue home  PRN xanax   DVT prophylaxis: enoxaparin (LOVENOX) injection 40 mg Start: 02/10/21 1800    Procedures: Family communication : none today Consults : CODE STATUS: DNR DVT Prophylaxis : Lovenox Level of care: Telemetry Medical Status is: Inpatient  Remains inpatient appropriate because: patient is at her baseline. She is medically ready for discharge.  1/25--Per TOC [facility] is  getting patient's finances worked out. May take few days  1/26--per TOC pt can d/c 1/27. COVID ordered         TOTAL TIME TAKING CARE OF THIS PATIENT: 20 minutes.  >50% time spent on counselling and coordination of care  Note: This dictation was prepared with Dragon dictation along with smaller phrase technology. Any transcriptional errors that result from this process are unintentional.  Terri Perez M.D    [facility]   CC: Primary care physician; Terri Perez., MD [name], female   DOB: 04-09-1945, 76 y.o.   MRN: [id number]

## 2021-02-18 NOTE — Plan of Care (Signed)

## 2021-02-19 MED ORDER — TRAMADOL HCL 50 MG PO TABS: 50.0000 mg | ORAL_TABLET | Freq: Two times a day (BID) | ORAL | 0 refills | Status: AC | PRN

## 2021-02-19 MED ORDER — ALPRAZOLAM 0.5 MG PO TABS: 0.5000 mg | ORAL_TABLET | Freq: Three times a day (TID) | ORAL | 0 refills | Status: AC | PRN

## 2021-02-19 MED ORDER — ALPRAZOLAM 0.5 MG PO TABS: 0.5000 mg | ORAL_TABLET | Freq: Four times a day (QID) | ORAL | 0 refills | Status: DC | PRN

## 2021-02-19 NOTE — Discharge Summary (Signed)
[facility] NAME: Terri Perez    MR#:  [id number]  DATE OF BIRTH:  05-17-1945  DATE OF ADMISSION:  02/10/2021 ADMITTING PHYSICIAN: Collier Bullock, MD  DATE OF DISCHARGE: 02/19/2021  PRIMARY CARE PHYSICIAN: Jerrol Banana., MD    ADMISSION DIAGNOSIS:  UTI (urinary tract infection) [N39.0] Cystitis [N30.90] Parkinson's disease ([place]) [G20]  DISCHARGE DIAGNOSIS:  Acute Cystitis Sepsis --POA SECONDARY DIAGNOSIS:   Past Medical History:  Diagnosis Date   Arm fracture    right forearm, d/t fall   Bowel trouble 2010   Breast cancer ([place]) 12/2009   LEFT LUMPECTOMY   Cancer ([place]) 12/2009   Left UOQ breast tumor; wide excision,sn bx and whole breast radiation. Chemotherapy done. There was only a 68m area of residual tumor remaining. All sn were negative. This was ER-positive, PR-negative, HER-2 neu not over expressed tumor. The original ultrasound size was 2.6 cm(T2).   H/O cystitis 2011   Malignant neoplasm of upper-outer quadrant of female breast ([place] January 13, 2010   2+ centimeter tumor, neoadjuvant chemotherapy. On wide excision 3 mm area of residual tumor. Sentinel node negative. ER-30%, PR-negative, HER-2 not overexpressing.   Parkinson disease ([place] 04/[place]   Dr SManuella Ghaziis pt s DR   Personal history of chemotherapy 2012   BREAST CA   Personal history of fibromyalgia 2002   Personal history of malignant neoplasm of large intestine    Personal history of radiation therapy 2012   BREAST CA   Special screening for malignant neoplasms, colon    Syncope    a. 03/2020 Echo: EF 50-55%, Gr1 DD, nl RV fxn, triv MR.   Unspecified constipation     HOSPITAL COURSE:  DJESELLE HISERis a 76y.o. female with a PMH significant for Parkinson's disease, history of rheumatoid arthritis, fibromyalgia. They presented from home to the ED on 02/10/2021 with acute delirium x1 days. In the ED, it was found that they had UTI  complicated with acute delirium.   Sepsis 2/2 complicated UTI   metabolic encephalopathy- sepsis criteria have resolved. LA normal x2.  -- About at baseline mentation, per family report.  --Remains afebrile with normal HR.  --Ucx showing multiple species but since she has been on Abx and showing improvement, will not recollect.  --BCx NGTD - completed abx - PT/OT for strengthening   Parkinson's disease   dementia   insomnia- chronic, stable. Has been progressively worsening, per report from family. -- Currently pursuing LTC option at [facility] --continue home carbidopa/levodopa   H/o Orthostatic hypotension dysautonomic syndrome- chronic. - monitor Bps closely - continue fludrocortisone, consider decreasing as she is bed bound  -BP stable no indication for BP meds   AKI on CKD IIIb- (resolved s/p IV fluids). baseline Cr around 1.24. Cr 1.19 - avoid nephrotoxic agents   Anxiety- chronic, stable - continue home PRN xanax     Family communication : Husband RKhamari Yousufon the phone. He is aware pt is discharging today CODE STATUS: DNR DVT Prophylaxis : Lovenox Level of care: Telemetry Medical Status is: Inpatient   [facility]  CONSULTS OBTAINED:    DRUG ALLERGIES:   Allergies  Allergen Reactions   Alendronate Nausea Only    Gi symptoms   Gluten Meal Nausea Only    Other reaction(s): NAUSEA Other reaction(s): NAUSEA Other reaction(s): NAUSEA   Lactose Intolerance (Gi) Diarrhea   Nsaids  GI upset. Other reaction(s): Other (See Comments) Pt unsure   Other Other (See Comments)    Other reaction(s): OTHER   Oxycodone-Acetaminophen    Oxycodone-Acetaminophen Other (See Comments)    Other reaction(s): NAUSEA   Vicodin [Hydrocodone-Acetaminophen] Nausea Only, Diarrhea and Nausea And Vomiting   Wheat Bran Other (See Comments)   Sulfa Antibiotics Rash    Rash from steri strips   Tape Rash    Rash from steri strips     DISCHARGE MEDICATIONS:   Allergies as of 02/19/2021       Reactions   Alendronate Nausea Only   Gi symptoms   Gluten Meal Nausea Only   Other reaction(s): NAUSEA Other reaction(s): NAUSEA Other reaction(s): NAUSEA   Lactose Intolerance (gi) Diarrhea   Nsaids    GI upset. Other reaction(s): Other (See Comments) Pt unsure   Other Other (See Comments)   Other reaction(s): OTHER   Oxycodone-acetaminophen    Oxycodone-acetaminophen Other (See Comments)   Other reaction(s): NAUSEA   Vicodin [hydrocodone-acetaminophen] Nausea Only, Diarrhea, Nausea And Vomiting   Wheat Bran Other (See Comments)   Sulfa Antibiotics Rash   Rash from steri strips   Tape Rash   Rash from steri strips        Medication List     STOP taking these medications    lidocaine 5 % Commonly known as: LIDODERM   meclizine 12.5 MG tablet Commonly known as: ANTIVERT   metoprolol succinate 25 MG 24 hr tablet Commonly known as: TOPROL-XL   rOPINIRole 1 MG tablet Commonly known as: REQUIP       TAKE these medications    ALPRAZolam 0.5 MG tablet Commonly known as: XANAX Take 1 tablet (0.5 mg total) by mouth 3 (three) times daily as needed. What changed: when to take this   carbidopa-levodopa 50-200 MG tablet Commonly known as: SINEMET CR Take 1 tablet by mouth at bedtime. What changed: Another medication with the same name was changed. Make sure you understand how and when to take each.   carbidopa-levodopa 25-100 MG tablet Commonly known as: SINEMET IR Take 2 tablets by mouth 3 (three) times daily. What changed: when to take this   fludrocortisone 0.1 MG tablet Commonly known as: FLORINEF TAKE ONE TABLET BY MOUTH DAILY   ondansetron 4 MG disintegrating tablet Commonly known as: ZOFRAN-ODT DISSOLVE ONE TABLET BY MOUTH EVERY 8 HOURS AS NEEDED FOR NAUSEA AND VOMITING   pantoprazole 20 MG tablet Commonly known as: PROTONIX Take 1 tablet (20 mg total) by mouth daily.   traMADol  50 MG tablet Commonly known as: ULTRAM Take 1 tablet (50 mg total) by mouth every 12 (twelve) hours as needed for moderate pain or severe pain. What changed: See the new instructions.               Discharge Care Instructions  (From admission, onward)           Start     Ordered   02/19/21 0000  Discharge wound care:       Comments: Foam padding   02/19/21 0847            If you experience worsening of your admission symptoms, develop shortness of breath, life threatening emergency, suicidal or homicidal thoughts you must seek medical attention immediately by calling 911 or calling your MD immediately  if symptoms less severe.  You Must read complete instructions/literature along with all the possible adverse reactions/side effects for all the Medicines you take and that have been  prescribed to you. Take any new Medicines after you have completely understood and accept all the possible adverse reactions/side effects.   Please note  You were cared for by a hospitalist during your hospital stay. If you have any questions about your discharge medications or the care you received while you were in the hospital after you are discharged, you can call the unit and asked to speak with the hospitalist on call if the hospitalist that took care of you is not available. Once you are discharged, your primary care physician will handle any further medical issues. Please note that NO REFILLS for any discharge medications will be authorized once you are discharged, as it is imperative that you return to your primary care physician (or establish a relationship with a primary care physician if you do not have one) for your aftercare needs so that they can reassess your need for medications and monitor your lab values. Today   SUBJECTIVE   Eating breakfast. No issues per pt or RN  VITAL SIGNS:  Blood pressure 125/70, pulse 71, temperature 98 F (36.7 C), resp. rate 16, height _0  (1.702  m), weight 76 kg, SpO2 100 %.  I/O:   Intake/Output Summary (Last 24 hours) at 02/19/2021 0850 Last data filed at 02/18/2021 1227 Gross per 24 hour  Intake --  Output 425 ml  Net -425 ml    PHYSICAL EXAMINATION:  GENERAL:  76 y.o.-year-old patient lying in the bed with no acute distress.  LUNGS: Normal breath sounds bilaterally, no wheezing, rales,rhonchi or crepitation.  CARDIOVASCULAR: S1, S2 normal. No murmurs, rubs, or gallops.  ABDOMEN: Soft, non-tender, non-distended. Bowel sounds present.  EXTREMITIES: No  clubbing.  NEUROLOGIC: non-focal PSYCHIATRIC:  patient is alert and awake Pressure Injury 02/11/21 Buttocks Medial Stage 1 -  Intact skin with non-blanchable redness of a localized area usually over a bony prominence. (Active)  02/11/21 0800  Location: Buttocks  Location Orientation: Medial  Staging: Stage 1 -  Intact skin with non-blanchable redness of a localized area usually over a bony prominence.  Wound Description (Comments):   Present on Admission:         DATA REVIEW:   CBC  Recent Labs  Lab 02/13/21 0535  WBC 6.5  HGB 12.4  HCT 36.9  PLT 181    Chemistries  Recent Labs  Lab 02/17/21 0508  NA 139  K 3.8  CL 104  CO2 27  GLUCOSE 94  BUN 20  CREATININE 0.85  CALCIUM 9.6    Microbiology Results   Recent Results (from the past 240 hour(s))  Resp Panel by RT-PCR (Flu A&B, Covid) Nasopharyngeal Swab     Status: None   Collection Time: 02/10/21  9:28 AM   Specimen: Nasopharyngeal Swab; Nasopharyngeal(NP) swabs in vial transport medium  Result Value Ref Range Status   SARS Coronavirus 2 by RT PCR NEGATIVE NEGATIVE Final    Comment: (NOTE) SARS-CoV-2 target nucleic acids are NOT DETECTED.  The SARS-CoV-2 RNA is generally detectable in upper respiratory specimens during the acute phase of infection. The lowest concentration of SARS-CoV-2 viral copies this assay can detect is 138 copies/mL. A negative result does not preclude  SARS-Cov-2 infection and should not be used as the sole basis for treatment or other patient management decisions. A negative result may occur with  improper specimen collection/handling, submission of specimen other than nasopharyngeal swab, presence of viral mutation(s) within the areas targeted by this assay, and inadequate number of viral copies(<138 copies/mL). A negative  result must be combined with clinical observations, patient history, and epidemiological information. The expected result is Negative.  Fact Sheet for Patients:  EntrepreneurPulse.com.au  Fact Sheet for Healthcare Providers:  IncredibleEmployment.be  This test is no t yet approved or cleared by the [place] FDA and  has been authorized for detection and/or diagnosis of SARS-CoV-2 by FDA under an Emergency Use Authorization (EUA). This EUA will remain  in effect (meaning this test can be used) for the duration of the COVID-19 declaration under Section 564(b)(1) of the Act, 21 U.S.C.section 360bbb-3(b)(1), unless the authorization is terminated  or revoked sooner.       Influenza A by PCR NEGATIVE NEGATIVE Final   Influenza B by PCR NEGATIVE NEGATIVE Final    Comment: (NOTE) The Xpert Xpress SARS-CoV-2/FLU/RSV plus assay is intended as an aid in the diagnosis of influenza from Nasopharyngeal swab specimens and should not be used as a sole basis for treatment. Nasal washings and aspirates are unacceptable for Xpert Xpress SARS-CoV-2/FLU/RSV testing.  Fact Sheet for Patients: EntrepreneurPulse.com.au  Fact Sheet for Healthcare Providers: IncredibleEmployment.be  This test is not yet approved or cleared by the [place] FDA and has been authorized for detection and/or diagnosis of SARS-CoV-2 by FDA under an Emergency Use Authorization (EUA). This EUA will remain in effect (meaning this test can be used) for the duration of  the COVID-19 declaration under Section 564(b)(1) of the Act, 21 U.S.C. section 360bbb-3(b)(1), unless the authorization is terminated or revoked.  Performed at [facility], [address] [place], [place] [address]   Blood Culture (routine x 2)     Status: None   Collection Time: 02/10/21  9:28 AM   Specimen: BLOOD  Result Value Ref Range Status   Specimen Description BLOOD BLOOD RIGHT HAND  Final   Special Requests   Final    BOTTLES DRAWN AEROBIC AND ANAEROBIC Blood Culture results may not be optimal due to an inadequate volume of blood received in culture bottles   Culture   Final    NO GROWTH 5 DAYS Performed at [facility], [address], [place], [place] [address]    Report Status 02/15/2021 FINAL  Final  Blood Culture (routine x 2)     Status: None   Collection Time: 02/10/21  9:28 AM   Specimen: BLOOD  Result Value Ref Range Status   Specimen Description BLOOD BLOOD RIGHT FOREARM  Final   Special Requests   Final    BOTTLES DRAWN AEROBIC AND ANAEROBIC Blood Culture adequate volume   Culture   Final    NO GROWTH 5 DAYS Performed at [facility], [address] [place], [place] [address]    Report Status 02/15/2021 FINAL  Final  Urine Culture     Status: Abnormal   Collection Time: 02/10/21 12:13 PM   Specimen: Urine, Random  Result Value Ref Range Status   Specimen Description   Final    URINE, RANDOM Performed at [facility], [address] [place], [place] [address]    Special Requests   Final    NONE Performed at [facility], [address] [place], [place] [address]    Culture MULTIPLE SPECIES PRESENT, SUGGEST RECOLLECTION (A)  Final   Report Status 02/11/2021 FINAL  Final  Resp Panel by RT-PCR (Flu A&B, Covid) Nasopharyngeal Swab     Status: None   Collection Time: 02/18/21  1:08 PM   Specimen: Nasopharyngeal Swab; Nasopharyngeal(NP) swabs in vial transport medium  Result Value Ref Range Status    SARS Coronavirus 2 by RT PCR NEGATIVE NEGATIVE Final    Comment: (NOTE) SARS-CoV-2 target nucleic acids are NOT DETECTED.  The SARS-CoV-2 RNA is generally detectable in upper respiratory specimens during the acute phase of infection. The lowest concentration of SARS-CoV-2 viral copies this assay can detect is 138 copies/mL. A negative result does not preclude SARS-Cov-2 infection and should not be used as the sole basis for treatment or other patient management decisions. A negative result may occur with  improper specimen collection/handling, submission of specimen other than nasopharyngeal swab, presence of viral mutation(s) within the areas targeted by this assay, and inadequate number of viral copies(<138 copies/mL). A negative result must be combined with clinical observations, patient history, and epidemiological information. The expected result is Negative.  Fact Sheet for Patients:  EntrepreneurPulse.com.au  Fact Sheet for Healthcare Providers:  IncredibleEmployment.be  This test is no t yet approved or cleared by the [place] FDA and  has been authorized for detection and/or diagnosis of SARS-CoV-2 by FDA under an Emergency Use Authorization (EUA). This EUA will remain  in effect (meaning this test can be used) for the duration of the COVID-19 declaration under Section 564(b)(1) of the Act, 21 U.S.C.section 360bbb-3(b)(1), unless the authorization is terminated  or revoked sooner.       Influenza A by PCR NEGATIVE NEGATIVE Final   Influenza B by PCR NEGATIVE NEGATIVE Final    Comment: (NOTE) The Xpert Xpress SARS-CoV-2/FLU/RSV plus assay is intended as an aid in the diagnosis of influenza from Nasopharyngeal swab specimens and should not be used as a sole basis for treatment. Nasal washings and aspirates are unacceptable for Xpert Xpress SARS-CoV-2/FLU/RSV testing.  Fact Sheet for  Patients: EntrepreneurPulse.com.au  Fact Sheet for Healthcare Providers: IncredibleEmployment.be  This test is not yet approved or cleared by the [place] FDA and has been authorized for detection and/or diagnosis of SARS-CoV-2 by FDA under an Emergency Use Authorization (EUA). This EUA will remain in effect (meaning this test can be used) for the duration of the COVID-19 declaration under Section 564(b)(1) of the Act, 21 U.S.C. section 360bbb-3(b)(1), unless the authorization is terminated or revoked.  Performed at [facility], [address] [place], [place] [address]     RADIOLOGY:  No results found.   CODE STATUS:     Code Status Orders  (From admission, onward)           Start     Ordered   02/10/21 1525  Do not attempt resuscitation (DNR)  Continuous       Question Answer Comment  In the event of cardiac or respiratory ARREST Do not call a code blue   In the event of cardiac or respiratory ARREST Do not perform Intubation, CPR, defibrillation or ACLS   In the event of cardiac or respiratory ARREST Use medication by any route, position, wound care, and other measures to relive pain and suffering. May use oxygen, suction and manual treatment of airway obstruction as needed for comfort.   Comments Code status was discussed with patient's husband and she is a DNR      02/10/21 1526           Code Status History     Date Active Date Inactive Code Status Order ID Comments User Context   08/21/2020 1746 08/22/2020 1842 DNR [id number]  Ivor Costa, MD ED   07/30/2020 1625 08/03/2020 2237 Full Code [id number]  Collier Bullock, MD ED  04/13/2020 2013 04/21/2020 1834 DNR [id number]  Toy Baker, MD ED      Advance Directive Documentation    Flowsheet Row Most Recent Value  Type of Advance Directive Healthcare Power of [place], Out of facility DNR (pink MOST or yellow form)  Pre-existing out of facility DNR  order (yellow form or pink MOST form) --  "MOST" Form in Place? --        TOTAL TIME TAKING CARE OF THIS PATIENT: 35 minutes.    Fritzi Mandes M.D  [facility]    CC: Primary care physician; Jerrol Banana., MD

## 2021-02-19 NOTE — TOC Transition Note (Signed)
Transition of Care [facility]) - CM/SW Discharge Note   Patient Details  Name: Terri Perez MRN: [id number] Date of Birth: 20-Oct-1945  [facility] CM/SW Contact:  Pete Pelt, RN Phone Number: 02/19/2021, 9:26 AM   Clinical Narrative:   Patient can discharge to [facility] today via EMS to room 209B as per Magda Paganini at facility.  Family aware.      Barriers to Discharge: Continued Medical Work up   Patient Goals and CMS Choice        Discharge Placement                       Discharge Plan and Services   Discharge Planning Services: CM Consult                                 Social Determinants of Health (SDOH) Interventions     Readmission Risk Interventions Readmission Risk Prevention Plan 02/15/2021  Transportation Screening Complete  PCP or Specialist Appt within 5-7 Days Complete  Home Care Screening Complete  Medication Review (RN CM) Complete  Some recent data might be hidden

## 2021-02-19 NOTE — Care Management Important Message (Signed)
Important Message  Patient Details  Name: Terri Perez MRN: [id number] Date of Birth: 1945-12-18   Medicare Important Message Given:  Yes     Juliann Pulse A Agostino Gorin 02/19/2021, 9:55 AM

## 2021-02-22 ENCOUNTER — Telehealth: Payer: Self-pay

## 2021-02-22 NOTE — Telephone Encounter (Addendum)
Transition Care Management Unsuccessful Follow-up Telephone Call  Date of discharge and from where: TCM DC [facility] 02-19-21 Dx: Sepsis secondary to UTI    Attempts:  1st Attempt  Reason for unsuccessful TCM follow-up call:  Left voice message   Pt in LTC facility- no need for TCM

## 2021-02-22 NOTE — Telephone Encounter (Signed)
Pt 's husband called saying his wife is in long term care now.  He does not know if there is any point in following up  CB#  [phone]

## 2021-03-02 ENCOUNTER — Telehealth: Payer: Self-pay

## 2021-03-02 NOTE — Telephone Encounter (Signed)
FYIAmparo Perez. Viewed appt on schedule for 03/25/21 and cancelled. KW

## 2021-03-02 NOTE — Telephone Encounter (Signed)
Copied from [place] [phone]. Topic: General - Other >> Mar 02, 2021 11:01 AM McGill, Nelva Bush wrote: Reason for CRM: Early Osmond, from [facility], called requesting to cancel pt appointment for AWV. Stated pt is now a long-term resident at [facility].

## 2021-03-11 ENCOUNTER — Other Ambulatory Visit: Payer: Self-pay | Admitting: Family Medicine

## 2021-03-11 DIAGNOSIS — K219 Gastro-esophageal reflux disease without esophagitis: Secondary | ICD-10-CM

## 2021-03-11 NOTE — Telephone Encounter (Signed)
Requested Prescriptions  Pending Prescriptions Disp Refills   pantoprazole (PROTONIX) 20 MG tablet [Pharmacy Med Name: PANTOPRAZOLE SOD DR 20 MG TAB] 90 tablet 1    Sig: TAKE ONE TABLET BY MOUTH DAILY     Gastroenterology: Proton Pump Inhibitors Passed - 03/11/2021  6:22 AM      Passed - Valid encounter within last 12 months    Recent Outpatient Visits          5 months ago [facility] Jerrol Banana., MD   9 months ago Orthostatic hypotension dysautonomic syndrome   [facility] Jerrol Banana., MD   10 months ago Orthostatic hypotension dysautonomic syndrome   [facility] Jerrol Banana., MD   1 year ago Parkinson disease [facility])   [facility] Jerrol Banana., MD   1 year ago Parkinson disease [facility])   [facility] Jerrol Banana., MD

## 2021-03-25 ENCOUNTER — Ambulatory Visit: Payer: Medicare Other | Admitting: Family Medicine

## 2021-04-02 ENCOUNTER — Emergency Department
Admission: EM | Admit: 2021-04-02 | Discharge: 2021-04-02 | Disposition: A | Discharge status: Skilled Nursing Facility | Payer: Medicare Other | Attending: Emergency Medicine | Admitting: Emergency Medicine

## 2021-04-02 ENCOUNTER — Other Ambulatory Visit: Payer: Self-pay

## 2021-04-02 ENCOUNTER — Emergency Department: Payer: Medicare Other

## 2021-04-02 DIAGNOSIS — Z96643 Presence of artificial hip joint, bilateral: Secondary | ICD-10-CM | POA: Insufficient documentation

## 2021-04-02 DIAGNOSIS — Z85820 Personal history of malignant melanoma of skin: Secondary | ICD-10-CM | POA: Diagnosis not present

## 2021-04-02 DIAGNOSIS — F0280 Dementia in other diseases classified elsewhere without behavioral disturbance: Secondary | ICD-10-CM | POA: Insufficient documentation

## 2021-04-02 DIAGNOSIS — M25551 Pain in right hip: Secondary | ICD-10-CM | POA: Diagnosis present

## 2021-04-02 DIAGNOSIS — Z853 Personal history of malignant neoplasm of breast: Secondary | ICD-10-CM | POA: Diagnosis not present

## 2021-04-02 DIAGNOSIS — N1831 Chronic kidney disease, stage 3a: Secondary | ICD-10-CM | POA: Diagnosis not present

## 2021-04-02 DIAGNOSIS — Y9289 Other specified places as the place of occurrence of the external cause: Secondary | ICD-10-CM | POA: Insufficient documentation

## 2021-04-02 DIAGNOSIS — Z85038 Personal history of other malignant neoplasm of large intestine: Secondary | ICD-10-CM | POA: Insufficient documentation

## 2021-04-02 DIAGNOSIS — G20 Parkinson's disease: Secondary | ICD-10-CM | POA: Insufficient documentation

## 2021-04-02 DIAGNOSIS — W19XXXA Unspecified fall, initial encounter: Secondary | ICD-10-CM

## 2021-04-02 DIAGNOSIS — W1839XA Other fall on same level, initial encounter: Secondary | ICD-10-CM | POA: Insufficient documentation

## 2021-04-02 DIAGNOSIS — I129 Hypertensive chronic kidney disease with stage 1 through stage 4 chronic kidney disease, or unspecified chronic kidney disease: Secondary | ICD-10-CM | POA: Diagnosis not present

## 2021-04-02 NOTE — Discharge Instructions (Signed)
Return to the ER for worsening symptoms, persistent vomiting, difficulty breathing or other concerns. °

## 2021-04-02 NOTE — ED Notes (Signed)
RN called for report to [facility].  ?

## 2021-04-02 NOTE — ED Notes (Signed)
Called [facility] for transport to [facility] ? ?

## 2021-04-02 NOTE — ED Notes (Signed)
RN to bedside to introduce self to pt. Pt is waiting for EMS transport back to facility. Pt advised she is ready to go home as well. Pt in no acute distress. Vitals assessed.  ?

## 2021-04-02 NOTE — ED Notes (Signed)
Family at bedside. Asking about her normal prescribed medications and would we be administering them. I advised that no, we would not. She has been discharged and waiting for transport to her facility.  ?

## 2021-04-02 NOTE — ED Provider Notes (Signed)
[facility] Provider Note    Event Date/Time   First MD Initiated Contact with Patient 04/02/21 [phone]     (approximate)   History   Fall and Hip Pain  Level V caveat: Limited by dementia  HPI  Terri Perez is a 76 y.o. female brought to the ED via EMS from SNF status post mechanical fall with right hip pain.  History of Parkinson's dementia who states she is mostly in a wheelchair now secondary to gait instability from her Parkinson's.  Try to get up from bed around 1 AM and fell.  Staff was able to put patient back into her bed but she complained of right hip pain subsequently.  Staff denies LOC.  Patient denies neck pain, chest pain, shortness of breath, abdominal pain, nausea, vomiting or dizziness.     Past Medical History   Past Medical History:  Diagnosis Date   Arm fracture    right forearm, d/t fall   Bowel trouble 2010   Breast cancer ([place]) 12/2009   LEFT LUMPECTOMY   Cancer ([place]) 12/2009   Left UOQ breast tumor; wide excision,sn bx and whole breast radiation. Chemotherapy done. There was only a 82m area of residual tumor remaining. All sn were negative. This was ER-positive, PR-negative, HER-2 neu not over expressed tumor. The original ultrasound size was 2.6 cm(T2).   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 On wide excision 3 mm area of residual tumor. Sentinel node negative. ER-30%, PR-negative, HER-2 not overexpressing.   Parkinson disease ([place] 04/2016   Dr SManuella Ghaziis pt s DR   Personal history of chemotherapy 2012   BREAST CA   Personal history of fibromyalgia 2002   Personal history of malignant neoplasm of large intestine    Personal history of radiation therapy 2012   BREAST CA   Special screening for malignant neoplasms, colon    Syncope    a. 03/2020 Echo: EF 50-55%, Gr1 DD, nl RV fxn, triv MR.   Unspecified  constipation      Active Problem List   Patient Active Problem List   Diagnosis Date Noted   Pressure injury of skin 02/12/2021   Cystitis    Sepsis secondary to UTI ([place] 02/10/2021   HTN (hypertension) 08/21/2020   HLD (hyperlipidemia) 08/21/2020   CKD (chronic kidney disease), stage IIIa 08/21/2020   Hypomagnesemia 08/21/2020   UTI (urinary tract infection) 07/30/2020   Generalized weakness 07/30/2020   B12 deficiency anemia 04/19/2020   Elevated troponin    Intractable vomiting with nausea    Hypokalemia 04/13/2020   Dehydration 04/13/2020   Syncope and collapse 080/88/1103  Acute metabolic encephalopathy 015/94/5859  AKI (acute kidney injury) ([place] 04/13/2020   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   Fracture of sacrum ([place] 08/04/2016   Adiposity 05/05/2016   Parkinson's disease ([place] 04/25/2016   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   Bilateral lower extremity edema 07/23/2015   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   Chronic kidney disease (CKD), stage III (moderate) (HCC) 05/11/2015   Elevated rheumatoid factor 05/11/2015   Primary osteoarthritis of both hips 05/11/2015   Syncope 04/24/2015   History of operative procedure on hip 04/24/2015   Restless leg 04/24/2015   Osteoporosis 03/30/2015   Insomnia 03/30/2015   Skin cancer 03/30/2015   MDD (major depressive disorder) 03/30/2015  Migraine 03/30/2015   GERD (gastroesophageal reflux disease) 03/30/2015   IBS (irritable bowel syndrome) 03/30/2015   OA (osteoarthritis) 03/30/2015   Skin lesion of chest wall 07/17/2014   Closed fracture of shaft of femur ([place]) 04/01/2014   Fracture of bone adjacent to prosthesis 04/01/2014   Breast cancer ([place]) 03/07/2013   Malignant neoplasm of upper-outer quadrant of female breast ([place])    Acute anxiety 06/11/2008   CONSTIPATION, CHRONIC 06/11/2008   Fibromyalgia 06/11/2008     Past Surgical  History   Past Surgical History:  Procedure Laterality Date   ABDOMINAL HYSTERECTOMY  1974   BACK SURGERY  1985   bone spurs between 5&6, used bone from left hip   BREAST BIOPSY Left 2011   POS   BREAST CYST ASPIRATION Left 1985   BREAST LUMPECTOMY Left 2011   BREAST SURGERY Left 2011   wide excision   CERVICAL DISCECTOMY     COLON RESECTION  Sept 2014   [facility]    COLONOSCOPY  2010   Dr. Allen Norris, normal   FEMUR FRACTURE SURGERY Right March 2016   JOINT REPLACEMENT Bilateral Jan 2016 and March 2016   hip   PORT-A-CATH REMOVAL  2013   PORTACATH PLACEMENT  2011   SKIN CANCER EXCISION  1980   face   TUBAL LIGATION  1973   UPPER GI ENDOSCOPY  08/20/07   gastritis without hemorrhage     Home Medications   Prior to Admission medications   Medication Sig Start Date End Date Taking? Authorizing Provider  ALPRAZolam Duanne Moron) 0.5 MG tablet Take 1 tablet (0.5 mg total) by mouth 3 (three) times daily as needed. 02/19/21   Fritzi Mandes, MD  carbidopa-levodopa (SINEMET CR) 50-200 MG tablet Take 1 tablet by mouth at bedtime.    [provider]  carbidopa-levodopa (SINEMET IR) 25-100 MG tablet Take 2 tablets by mouth 3 (three) times daily. Patient taking differently: Take 2 tablets by mouth 5 (five) times daily. 04/15/20   Sharen Hones, MD  fludrocortisone (FLORINEF) 0.1 MG tablet TAKE ONE TABLET BY MOUTH DAILY 07/30/20   Jerrol Banana., MD  ondansetron (ZOFRAN-ODT) 4 MG disintegrating tablet DISSOLVE ONE TABLET BY MOUTH EVERY 8 HOURS AS NEEDED FOR NAUSEA AND VOMITING 11/16/20   Jerrol Banana., MD  pantoprazole (PROTONIX) 20 MG tablet TAKE ONE TABLET BY MOUTH DAILY 03/11/21   Jerrol Banana., MD  traMADol (ULTRAM) 50 MG tablet Take 1 tablet (50 mg total) by mouth every 12 (twelve) hours as needed for moderate pain or severe pain. 02/19/21   Fritzi Mandes, MD     Allergies  Alendronate, Gluten meal, Lactose intolerance (gi), Nsaids, Other, Oxycodone-acetaminophen,  Oxycodone-acetaminophen, Vicodin [hydrocodone-acetaminophen], Wheat bran, Sulfa antibiotics, and Tape   Family History   Family History  Problem Relation Age of Onset   Cancer Brother        colon   Cancer Other        breast and ovarian cancers, relationships not listed   Stroke Mother    Osteoporosis Mother    Depression Sister    Depression Sister    Depression Sister    Cancer Other 22       niece   Breast cancer Neg Hx      Physical Exam  Triage Vital Signs: ED Triage Vitals  Enc Vitals Group     BP 04/02/21 0339 140/70     Pulse Rate 04/02/21 0339 84     Resp 04/02/21 0339 16  Temp 04/02/21 0339 98.1 F (36.7 C)     Temp Source 04/02/21 0339 Oral     SpO2 04/02/21 0339 91 %     Weight 04/02/21 0338 174 lb 6.1 oz (79.1 kg)     Height 04/02/21 0338 _0  (1.702 m)     Head Circumference --      Peak Flow --      Pain Score 04/02/21 0338 4     Pain Loc --      Pain Edu? --      Excl. in [place]? --     Updated Vital Signs: BP (!) 141/67    Pulse 94    Temp 98.1 F (36.7 C) (Oral)    Resp 18    Ht _1  (1.702 m)    Wt 79.1 kg    SpO2 91%    BMI 27.31 kg/m    General: Awake, no distress.  CV:  RRR.  Good peripheral perfusion.  Resp:  Normal effort.  Slightly diminished bibasilarly. Abd:  Nontender.  No distention.  Other:  Pelvis is stable.  Right hip minimally tender to palpation with range of motion.  Right lower leg is not shortened nor externally rotated.  Bilateral pedal edema.  Palpable distal pulses.   ED Results / Procedures / Treatments  Labs (all labs ordered are listed, but only abnormal results are displayed) Labs Reviewed - No data to display   EKG  None   RADIOLOGY I have independently visualized and reviewed patient's x-ray as well as noted the radiology interpretation:  Right hip x-ray: No evidence of displaced fracture, THA hardware intact  Official radiology report(s): DG Hip Unilat With Pelvis 2-3 Views Right  Result Date:  04/02/2021 CLINICAL DATA:  Fall with right hip pain. EXAM: DG HIP (WITH OR WITHOUT PELVIS) 2-3V RIGHT COMPARISON:  CT abdomen pelvis and reconstructions of 02/10/2021, 04/13/2020. FINDINGS: Osteopenia. No pelvic diastasis or displaced fracture seen, with chronic right pubic bone healed fracture deformity again noted. There is a right hip revision total joint arthroplasty. The entire femoral stem was not fully imaged but the visualized portion demonstrating no evidence of loosening. No periprosthetic fracture or prosthetic dislocation is seen. There is spurring of the SI joints pubic symphysis, degenerative changes of the visualized lower lumbar spine. Intestinal postsurgical changes right lower quadrant. IMPRESSION: There is osteopenia without evidence of a displaced fracture or pelvic diastasis. Chronic healed fracture deformity of the right pubic bone is present and a nondisplaced refracture through the old injury would be difficult to exclude given the degree of osteopenia. If there is concern for occult fracture, CT may be helpful. Right hip revision THA, without displaced fracture identified. The femoral stem was not fully imaged. Electronically Signed   By: Telford Nab M.D.   On: 04/02/2021 04:49     PROCEDURES:  Critical Care performed: No  Procedures   MEDICATIONS ORDERED IN ED: Medications - No data to display   IMPRESSION / MDM / [place] [address] / ED COURSE  I reviewed the triage vital signs and the nursing notes.                             76 year old female presenting with right hip pain status post mechanical fall.  Differential diagnosis includes but is not limited to fracture, dislocation, musculoskeletal contusion, etc. I have personally reviewed patient's chart and note her recent neurology office visit on 03/11/2021 which mentions her  increasing limited mobility as well as her SNF visit from her PCP on 03/10/2021.  0528 Updated patient on unremarkable x-ray results.  She  is currently resting in no acute distress, declines analgesia.  Will discharge back to nursing facility.  Strict return precautions given.  Patient verbalizes understanding and agrees with plan of care.  FINAL CLINICAL IMPRESSION(S) / ED DIAGNOSES   Final diagnoses:  Fall, initial encounter  Right hip pain     Rx / DC Orders   ED Discharge Orders     None        Note:  This document was prepared using Dragon voice recognition software and may include unintentional dictation errors.   Paulette Blanch, MD 04/02/21 [phone]

## 2021-04-02 NOTE — ED Triage Notes (Signed)
Pt presents to ER via ems from [facility] following a mechanical fall at her facility around 0100 tonight.  Staff was able to get pt back to bed but pt began c/o right hip pain after getting back into bed.  Pt has hx of dementia and is A&O to self at baseline.  Pt in NAD at this time.  ?

## 2021-05-06 ENCOUNTER — Telehealth: Payer: Self-pay | Admitting: Family Medicine

## 2021-05-06 NOTE — Telephone Encounter (Signed)
Copied from [place] [phone]. Topic: General - Other ?>> May 06, 2021  8:52 AM Leward Quan A wrote: ?Reason for CRM: Patient husband called in to inform Dr Rosanna Randy that patient passed away last night 05-11-2021. He wanted to pass this news on but can be reached at  Ph# [phone] ?

## 2021-05-24 DEATH — deceased

## 2022-03-07 IMAGING — CT CT ANGIO CHEST
2 of 6 series · 19 of 36 positions shown · IV contrast (omnipaque)
Comparison: None.

CLINICAL DATA: PE suspected, high probability. History of breast
cancer.

EXAM:
CT ANGIOGRAPHY CHEST WITH CONTRAST
TECHNIQUE: Multidetector CT imaging of the chest was performed using the
standard protocol during bolus administration of intravenous
contrast. Multiplanar CT image reconstructions and MIPs were
obtained to evaluate the vascular anatomy.
CONTRAST:  60mL OMNIPAQUE IOHEXOL 350 MG/ML SOLN

[Series 6: thins · axial · 0.74mm/px · z∈[-151,+94]mm · 18 of 273 slices shown]
[im 14/273  lung]
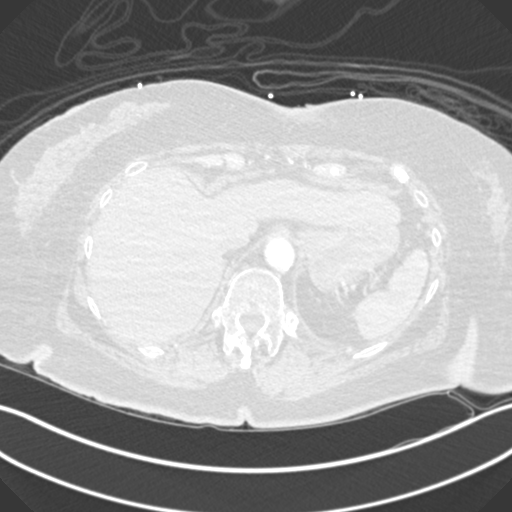
[im 28/273  mediastinal]
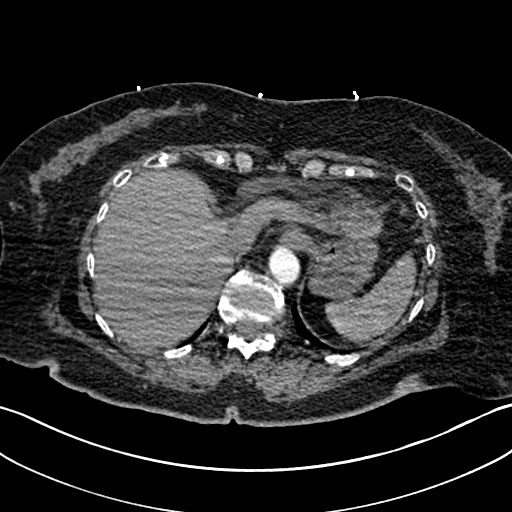
[im 41/273  lung]
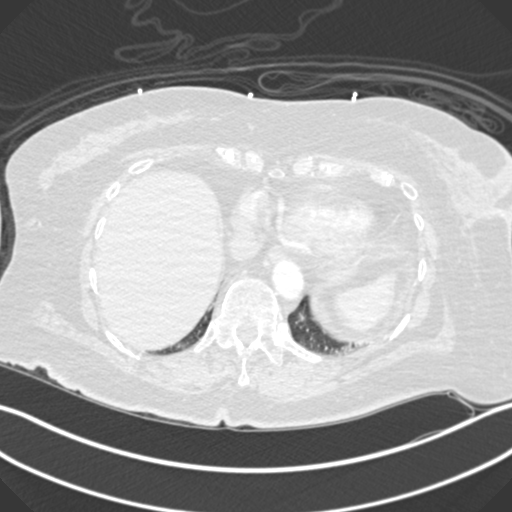
[im 55/273  mediastinal]
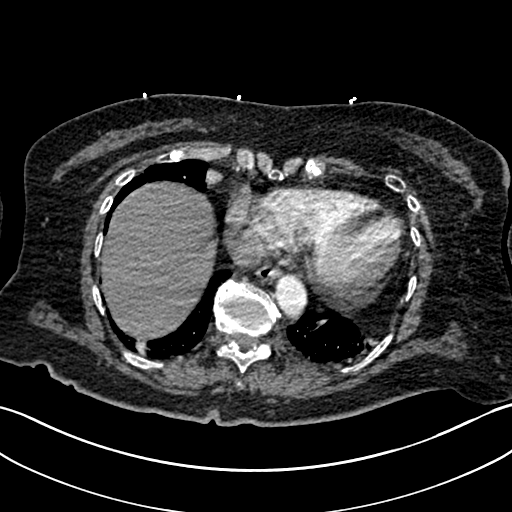
[im 69/273  lung]
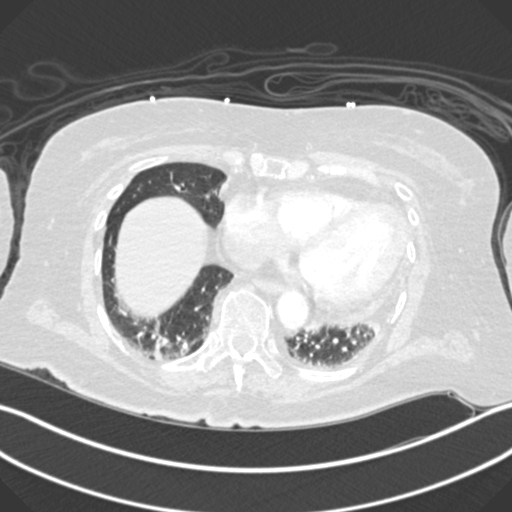
[im 82/273  mediastinal]
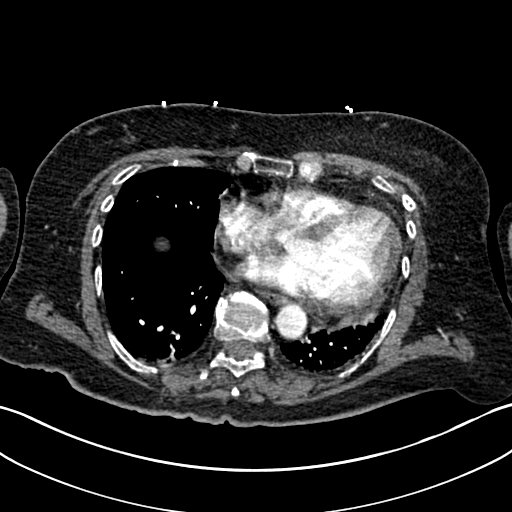
[im 96/273  lung]
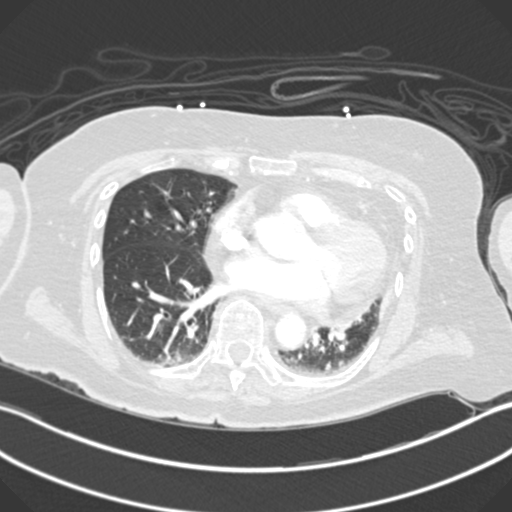
[im 109/273  mediastinal]
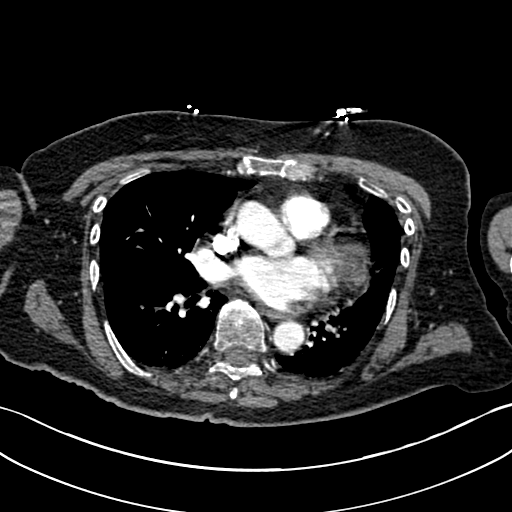
[im 123/273  lung]
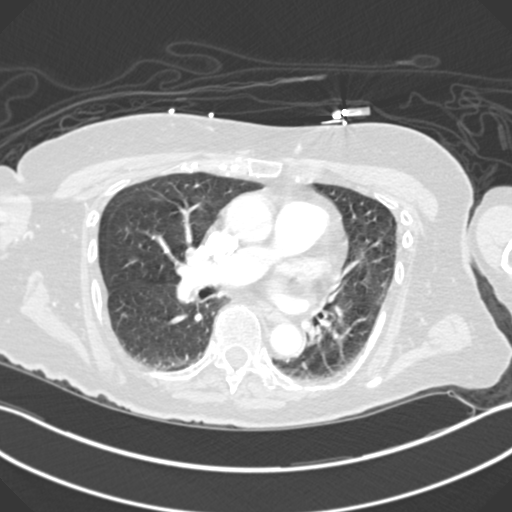
[im 150/273  mediastinal]
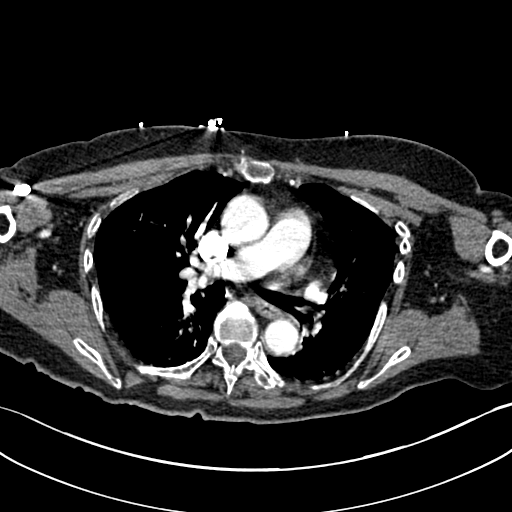
[im 164/273  lung]
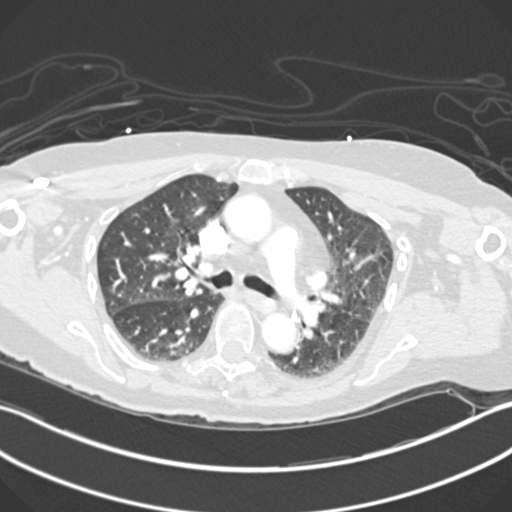
[im 177/273  mediastinal]
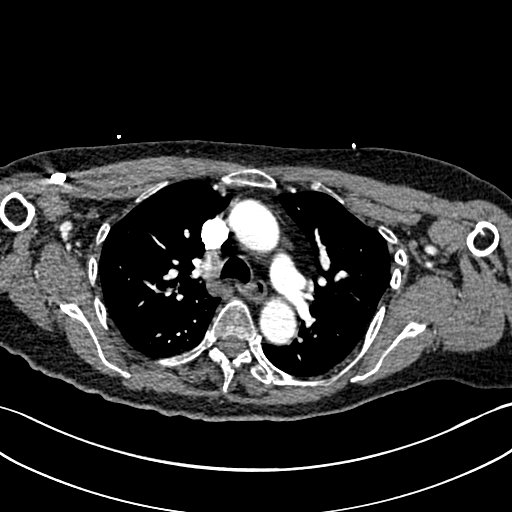
[im 191/273  lung]
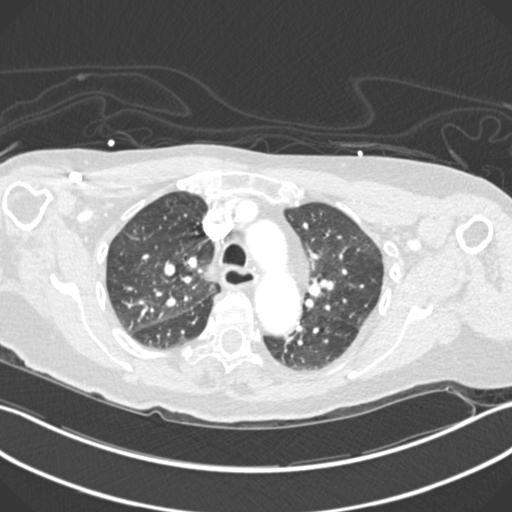
[im 205/273  mediastinal]
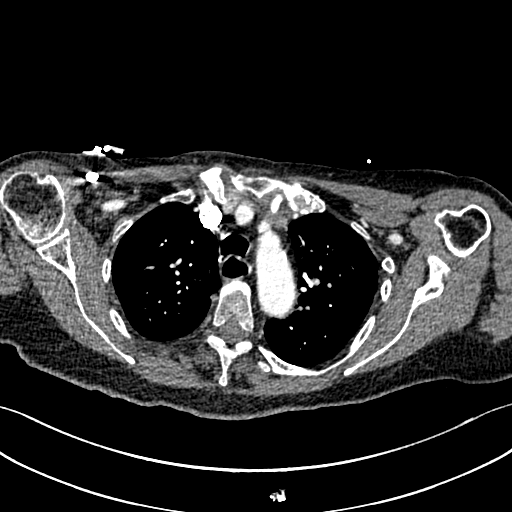
[im 218/273  lung]
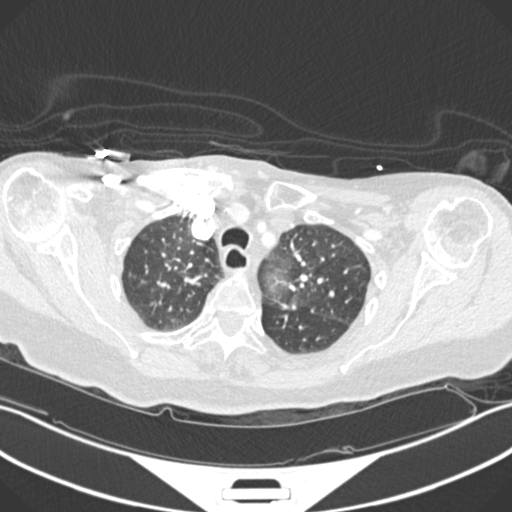
[im 232/273  mediastinal]
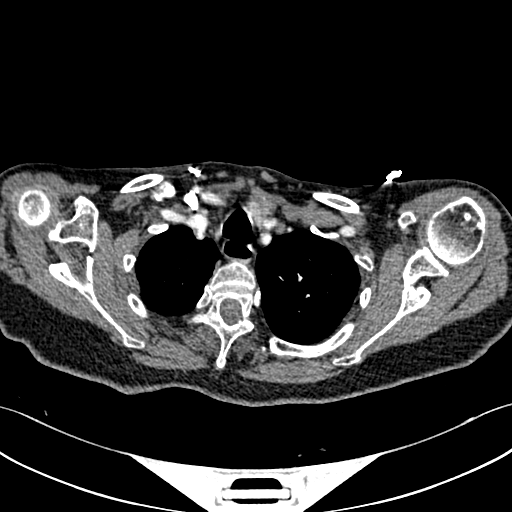
[im 245/273  lung]
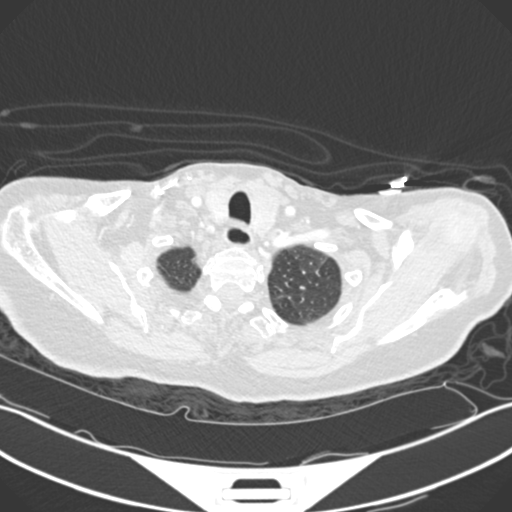
[im 259/273  mediastinal]
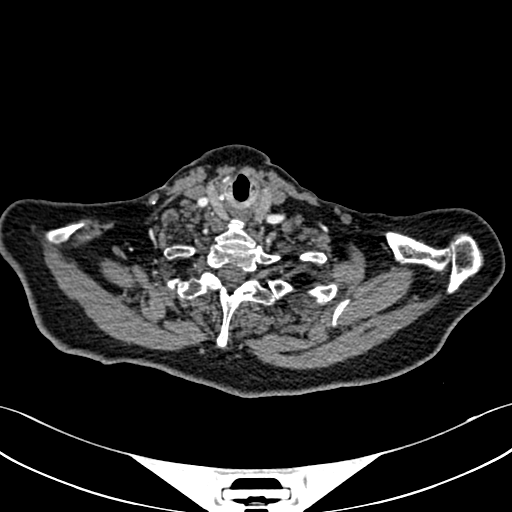

[Series 8: coronal mpr · coronal · 0.55mm/px · 1 of 121 slices shown]
[im 61/121  mediastinal]
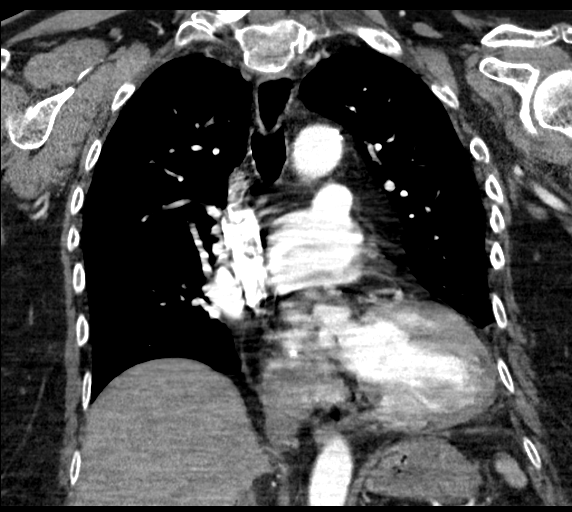

[19 of 36 positions shown; findings below may reference images not displayed]

FINDINGS: Cardiovascular: No filling defects in the pulmonary arteries to
suggest pulmonary emboli. Cardiomegaly. Coronary artery and aortic
calcifications. No aneurysm.

Mediastinum/Nodes: No mediastinal, hilar, or axillary adenopathy.
Trachea and esophagus are unremarkable.

Lungs/Pleura: Bibasilar atelectasis. No confluent opacities or
effusions.

Upper Abdomen: Imaging into the upper abdomen demonstrates no acute
findings.

Musculoskeletal: Chest wall soft tissues are unremarkable. No acute
bony abnormality.

Review of the MIP images confirms the above findings.
IMPRESSION: No evidence of pulmonary embolus.

Cardiomegaly, coronary artery disease.

Bibasilar atelectasis.

Aortic Atherosclerosis ([id number].R).

## 2022-03-07 IMAGING — CT CT CERVICAL SPINE W/O CM
3 of 4 series · 12 of 33 positions shown, 14 images · non-contrast
Comparison: MR head 05/06/2016

CLINICAL DATA: Limited history, patient poor histroian. Hx of
breast cancer and Parkinson's. Per nurse note: Arrives by EMS s/p
fall. Did not hit head, no LOC.

EXAM:
CT HEAD WITHOUT CONTRAST
CT CERVICAL SPINE WITHOUT CONTRAST
TECHNIQUE: Multidetector CT imaging of the head and cervical spine was
performed following the standard protocol without intravenous
contrast. Multiplanar CT image reconstructions of the cervical spine
were also generated.

[Series 6: sagittal bone · sagittal · 0.27mm/px · 5 of 60 slices shown, 6 images]
[im 20/60  bone]
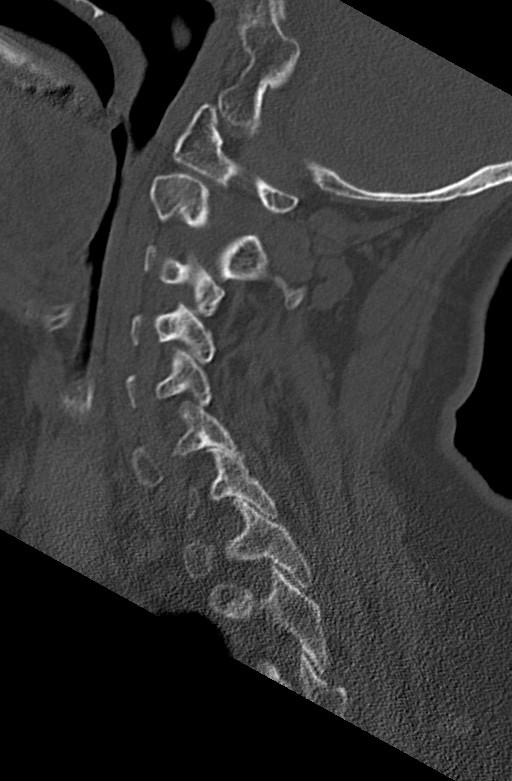
[im 25/60  bone]
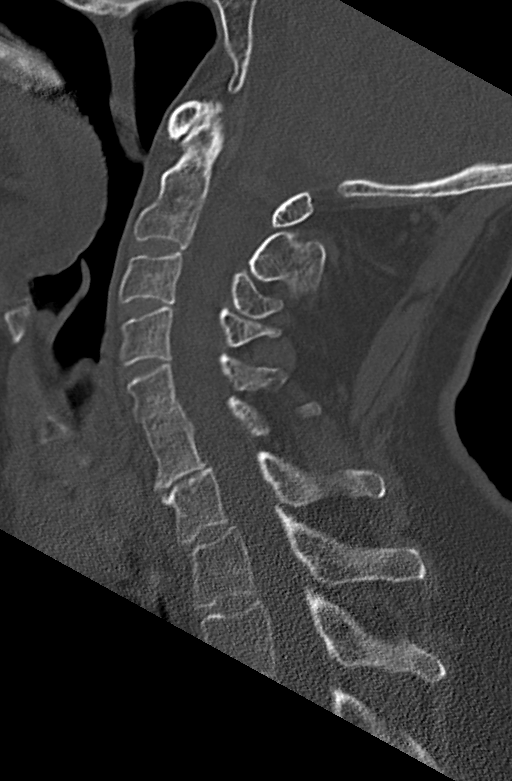
[im 30/60  soft-tissue]
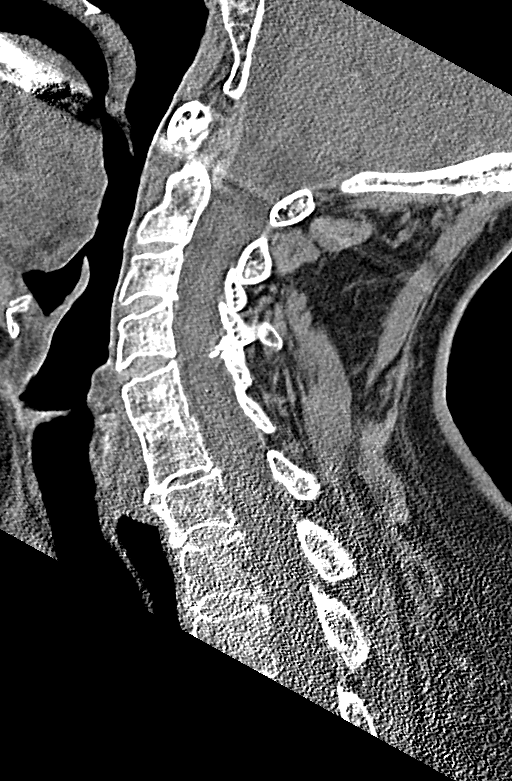
[im 30/60  bone]
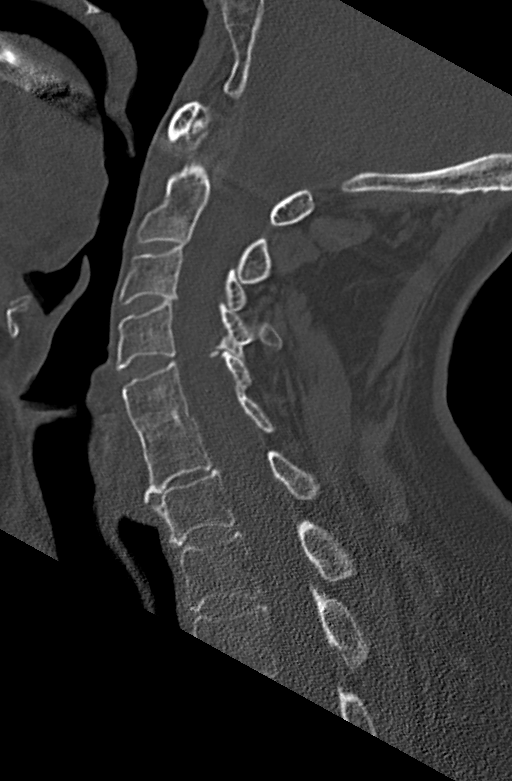
[im 35/60  bone]
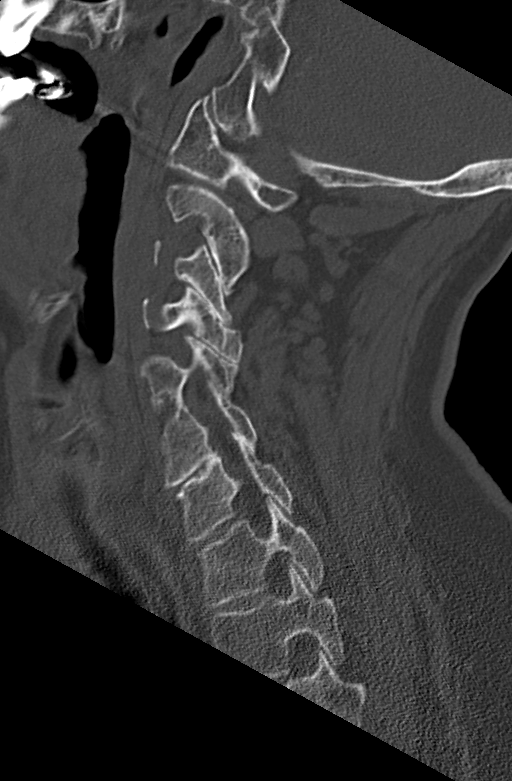
[im 40/60  bone]
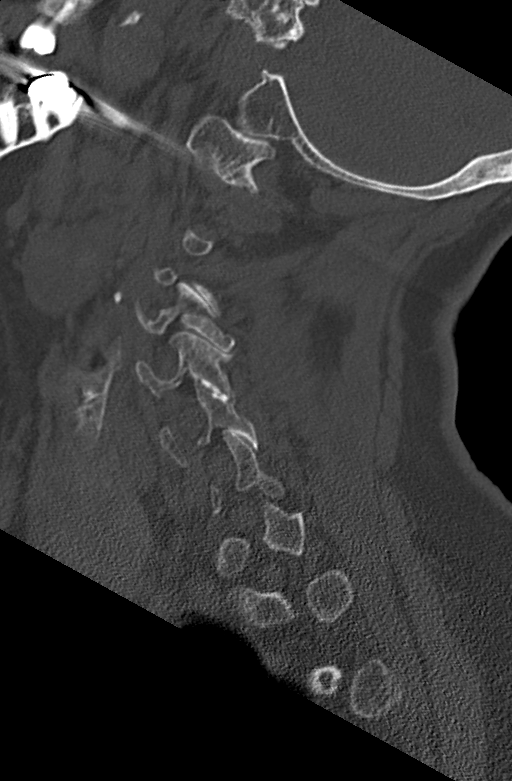

[Series 7: coronal bone · coronal · 0.25mm/px · 3 of 61 slices shown]
[im 14/61  bone]
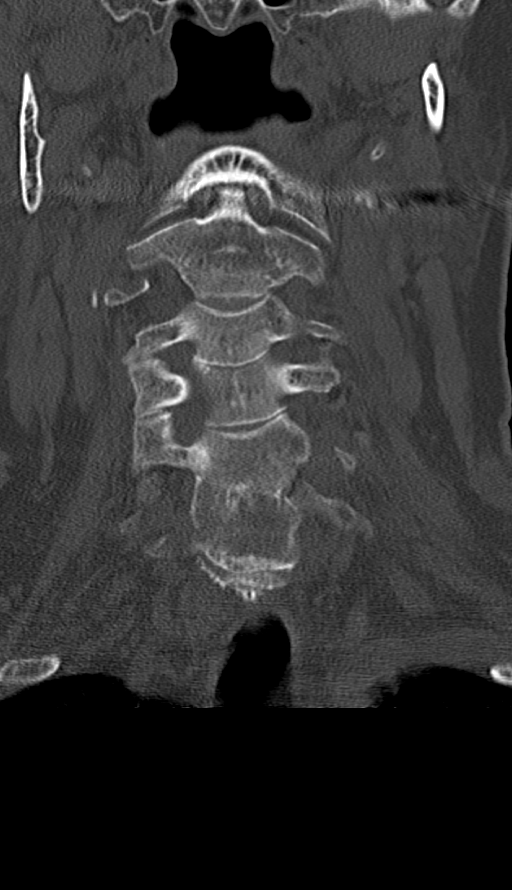
[im 25/61  bone]
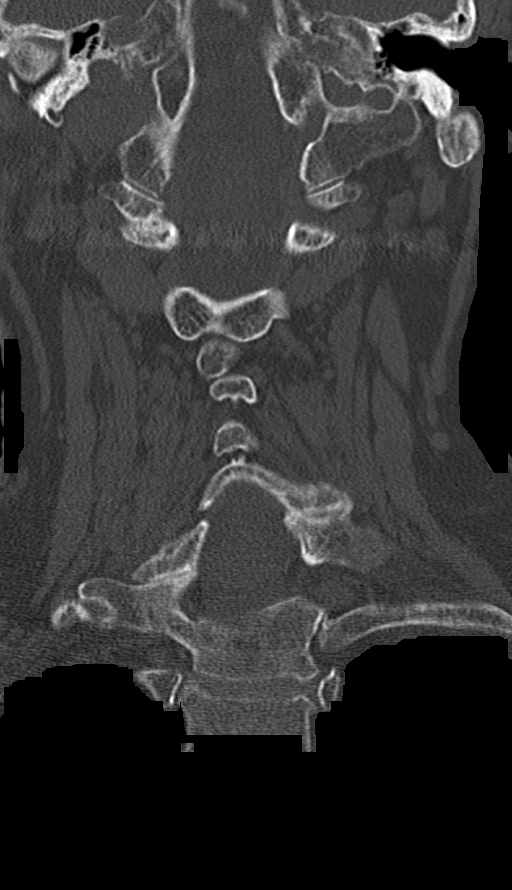
[im 36/61  bone]
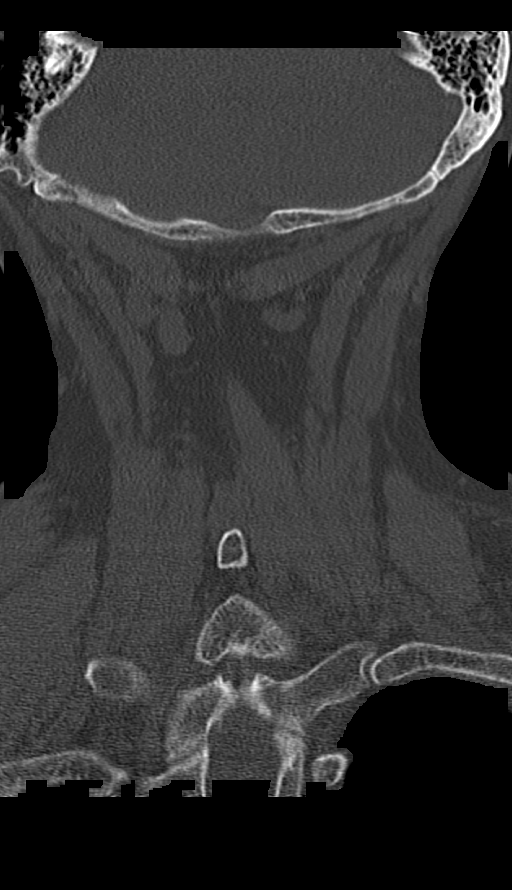

[Series 8: orthogonal bone · axial · 0.25mm/px · z∈[+41,+162]mm · 4 of 102 slices shown, 5 images]
[im 15/102  soft-tissue]
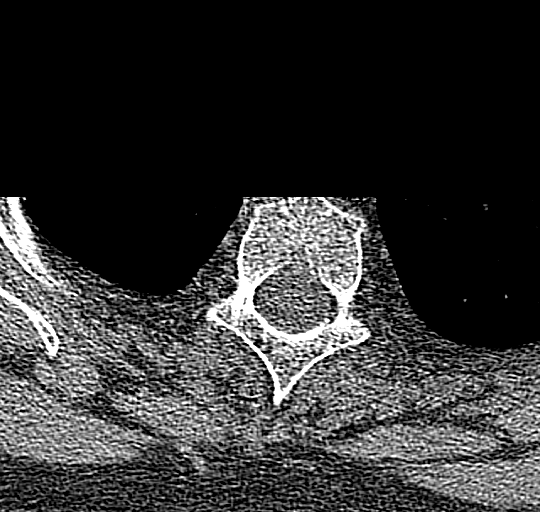
[im 15/102  bone]
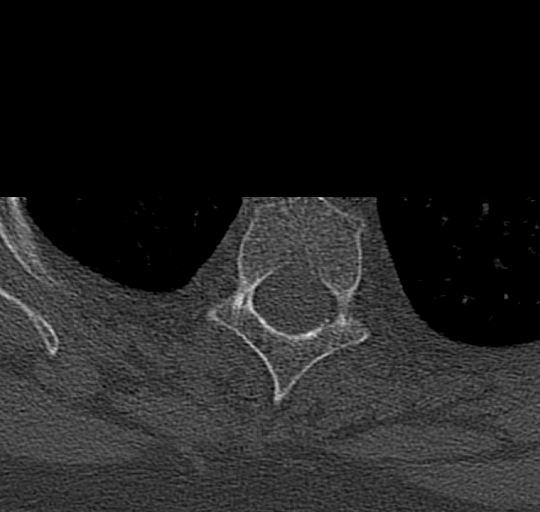
[im 44/102  bone]
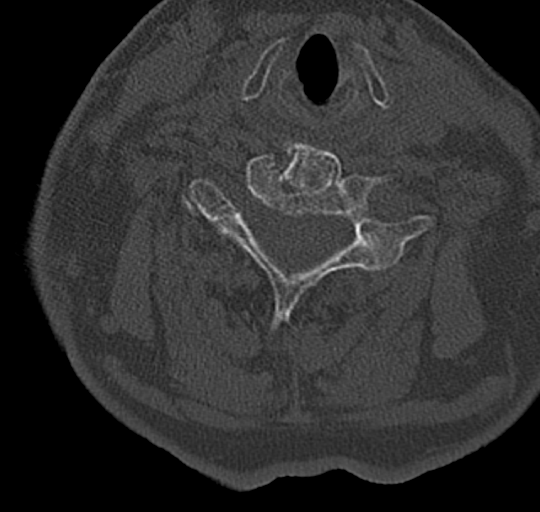
[im 58/102  bone]
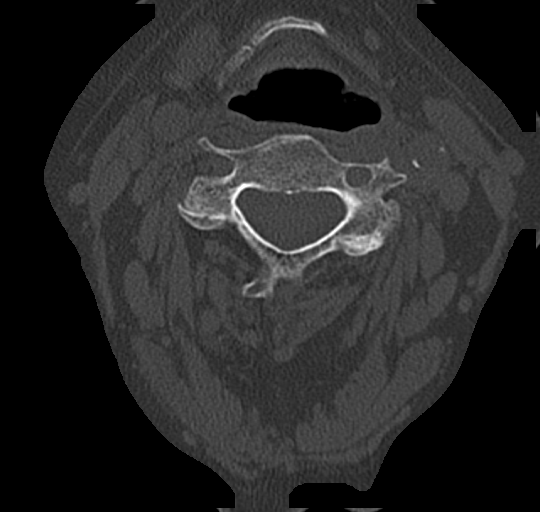
[im 87/102  bone]
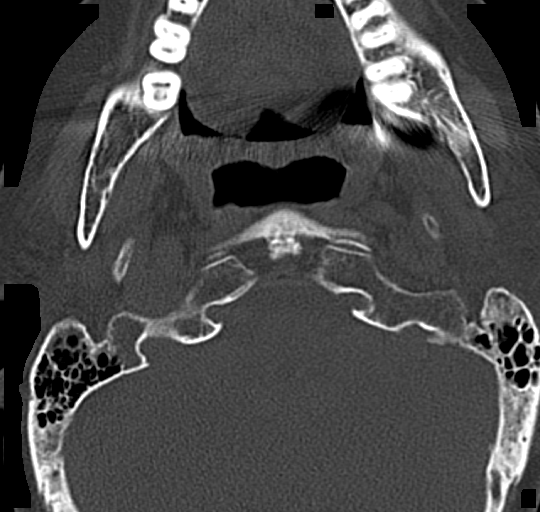

[12 of 33 positions shown; findings below may reference images not displayed]

FINDINGS: CT HEAD FINDINGS

Brain:

Cerebral ventricle sizes are concordant with the degree of cerebral
volume loss.

No evidence of large-territorial acute infarction. No parenchymal
hemorrhage. No mass lesion. No extra-axial collection.

No mass effect or midline shift. No hydrocephalus. Basilar cisterns
are patent.

Vascular: No hyperdense vessel.

Skull: No acute fracture or focal lesion.

Sinuses/Orbits: Paranasal sinuses and mastoid air cells are clear.
The orbits are unremarkable.

Other: None.

CT CERVICAL SPINE FINDINGS

Alignment: Normal.

Skull base and vertebrae: Diffusely decreased bone density.
Multilevel mild degenerative changes of the spine. Partial fusion of
the C5-C6 vertebral bodies. Mild to moderate intervertebral disc
spaces at the C6-C7 level. No acute fracture. No aggressive
appearing focal osseous lesion or focal pathologic process.

Soft tissues and spinal canal: No prevertebral fluid or swelling. No
visible canal hematoma.

Upper chest: Unremarkable.

Other: None.
IMPRESSION: 1. No acute intracranial abnormality.
2. No acute displaced fracture or traumatic listhesis of the
cervical spine.

## 2022-03-08 IMAGING — US US CAROTID DUPLEX BILAT
1 series · 13 of 24 positions shown · non-contrast
Comparison: None.

CLINICAL DATA: 74-year-old female with history of syncope.

EXAM:
BILATERAL CAROTID DUPLEX ULTRASOUND
TECHNIQUE: Gray scale imaging, color Doppler and duplex ultrasound were
performed of bilateral carotid and vertebral arteries in the neck.

[Series 1: us carotid bilateral · 13 of 63 slices shown]
[im 1/63]
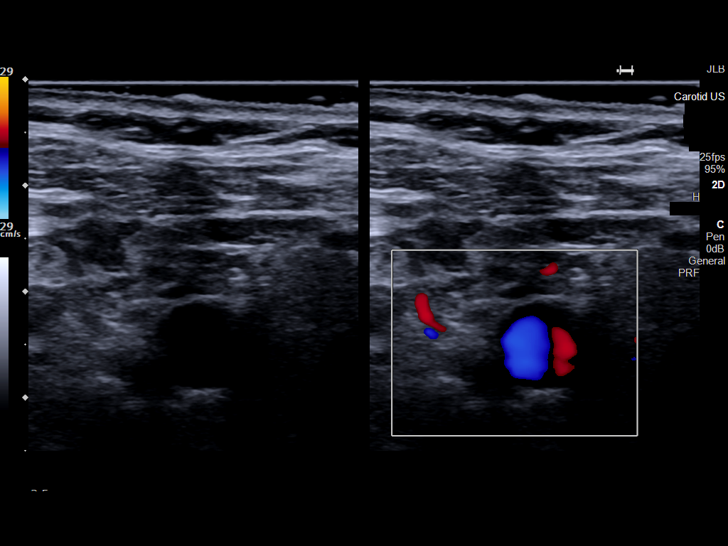
[im 6/63]
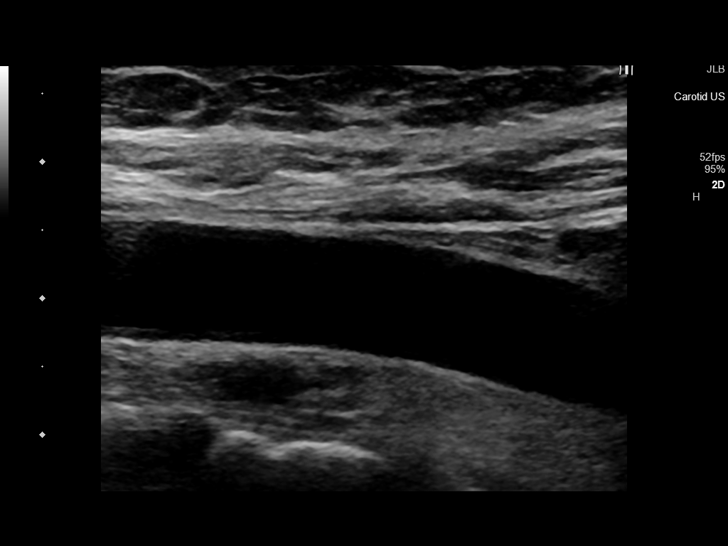
[im 11/63]
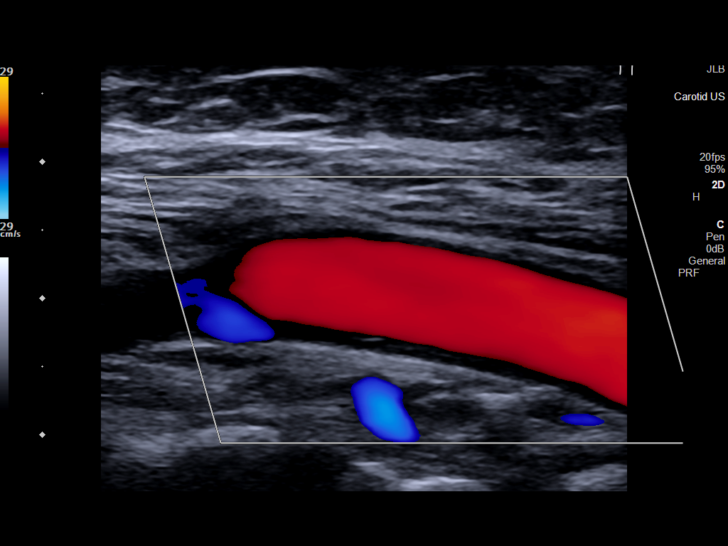
[im 17/63]
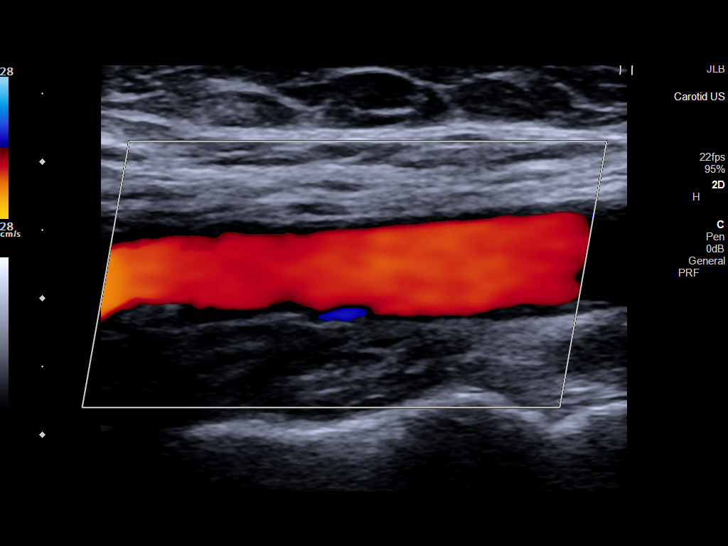
[im 22/63]
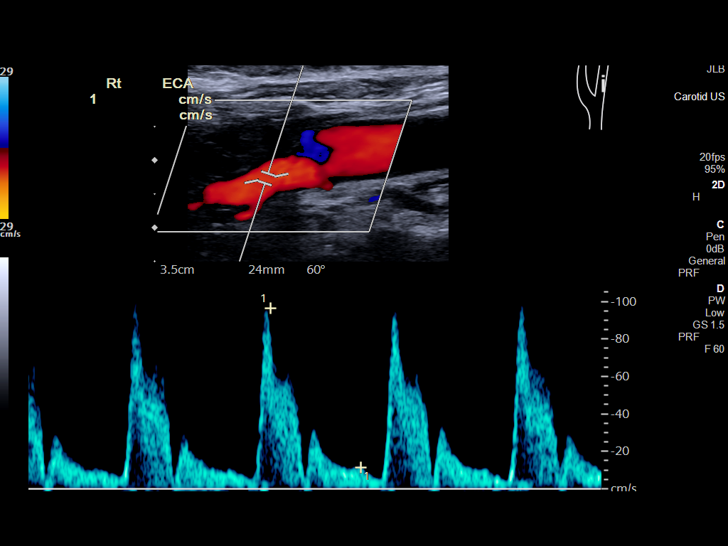
[im 27/63]
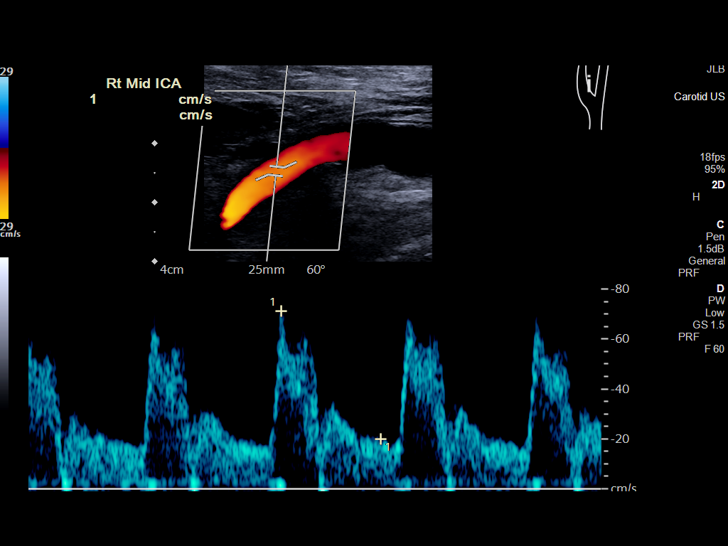
[im 33/63]
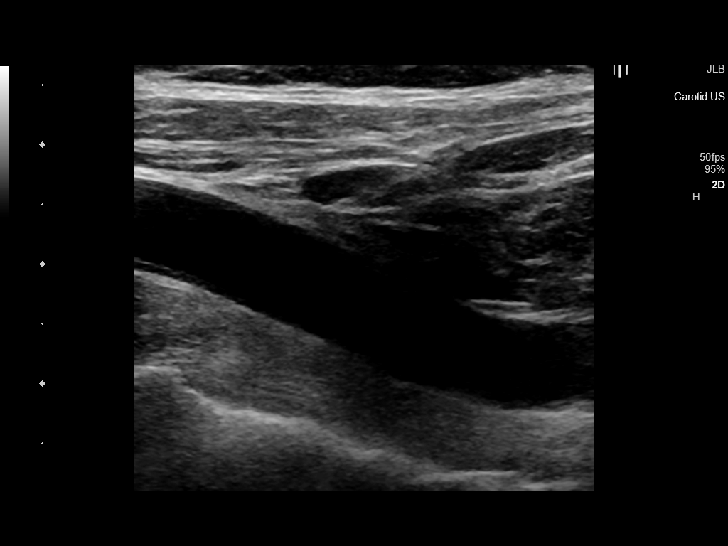
[im 36/63]
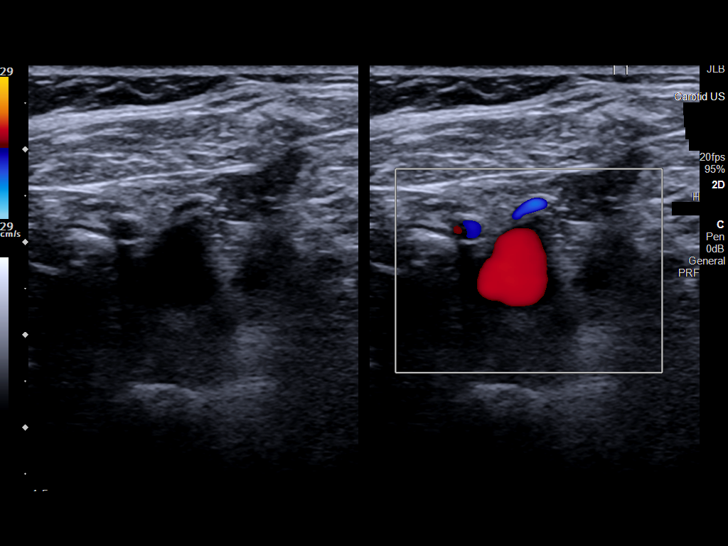
[im 41/63]
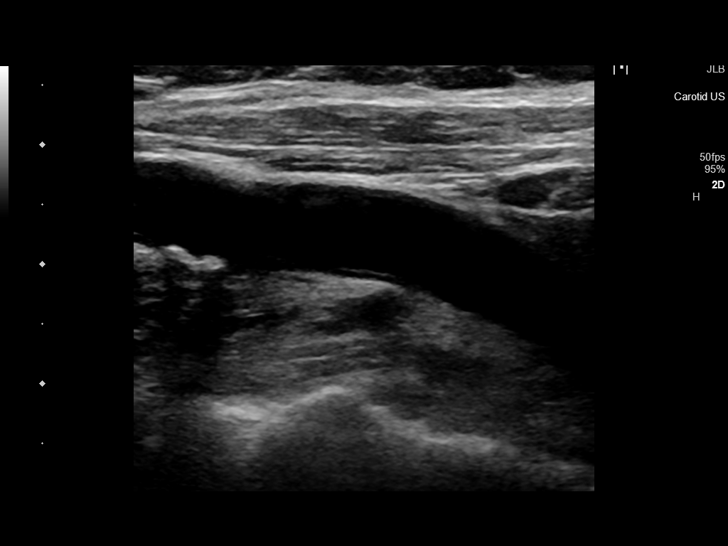
[im 46/63]
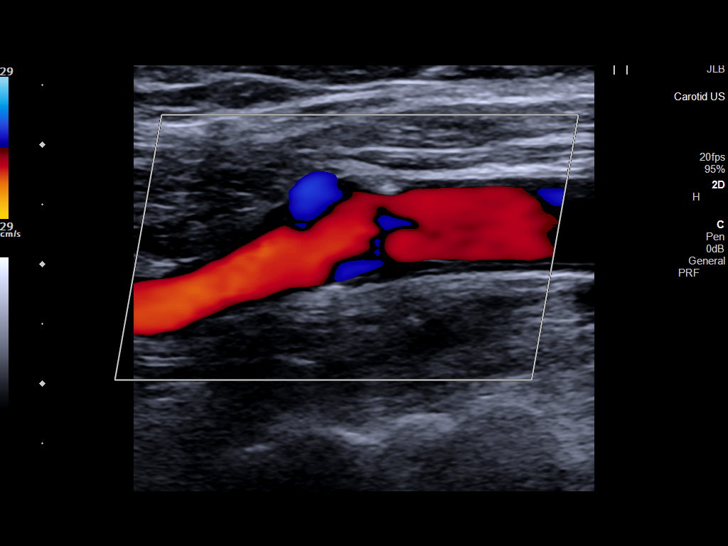
[im 52/63]
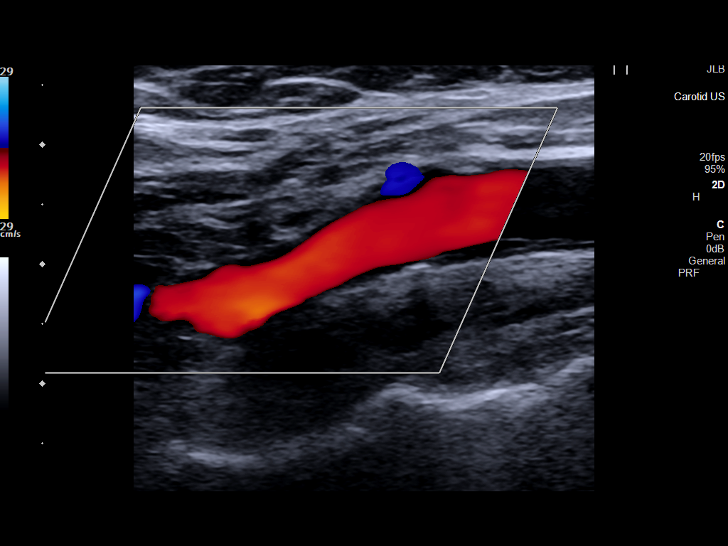
[im 57/63]
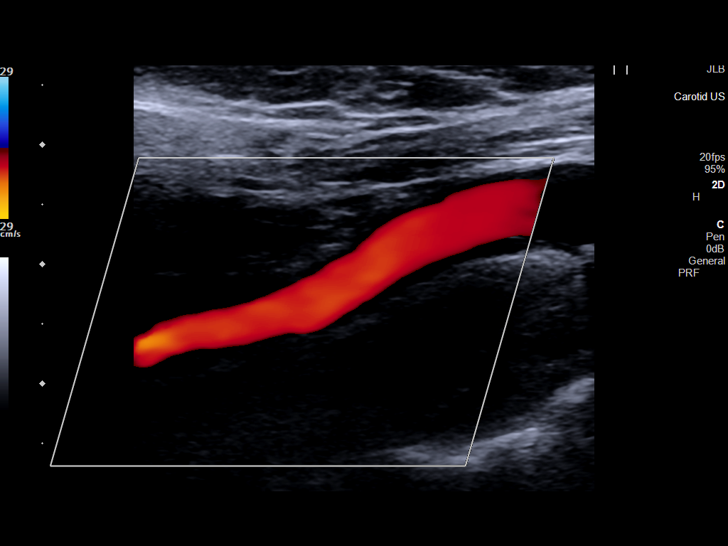
[im 63/63]
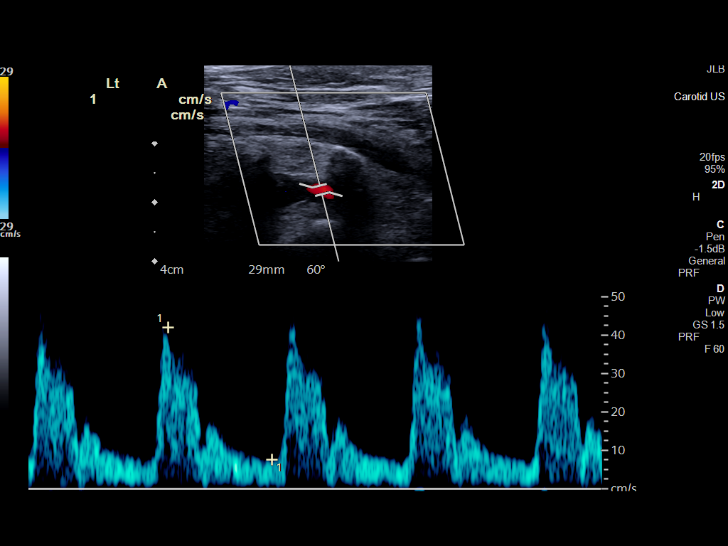

[13 of 24 positions shown; findings below may reference images not displayed]

FINDINGS: Criteria: Quantification of carotid stenosis is based on velocity
parameters that correlate the residual internal carotid diameter
with NASCET-based stenosis levels, using the diameter of the distal
internal carotid lumen as the denominator for stenosis measurement.

The following velocity measurements were obtained:

RIGHT

ICA: Peak systolic velocity 116 cm/sec, End diastolic velocity 31
cm/sec

CCA: Peak systolic velocity 71 cm/sec

SYSTOLIC ICA/CCA RATIO:

ECA: Peak systolic velocity 96 cm/sec

LEFT

ICA: Peak systolic velocity 98 cm/sec, End diastolic velocity 29
cm/sec

CCA: 81 cm/sec

SYSTOLIC ICA/CCA RATIO:

ECA: 80 cm/sec

RIGHT CAROTID ARTERY: Mild focal atherosclerotic plaque formation
about the proximal ICA. No significant tortuosity. Normal low
resistance waveforms.

RIGHT VERTEBRAL ARTERY:  Antegrade flow.

LEFT CAROTID ARTERY: Mild multifocal atherosclerotic plaque
formation about the carotid bulb and bifurcation. No significant
tortuosity. Normal low resistance waveforms.

LEFT VERTEBRAL ARTERY:  Antegrade flow.

Upper extremity non-invasive blood pressures:

Not obtained.
IMPRESSION: 1. Right carotid artery system: Less than 50% stenosis secondary to
mild atherosclerotic plaque formation.

2. Left carotid artery system: Less than 50% stenosis secondary to
mild atherosclerotic plaque formation.

3.  Vertebral artery system: Patent with antegrade flow bilaterally.

## 2022-07-15 IMAGING — DX DG CHEST 1V PORT
1 series · 1 of 1 positions shown · non-contrast
Comparison: April 13, 2020.

CLINICAL DATA: Syncope

EXAM:
PORTABLE CHEST 1 VIEW

[chest ap]
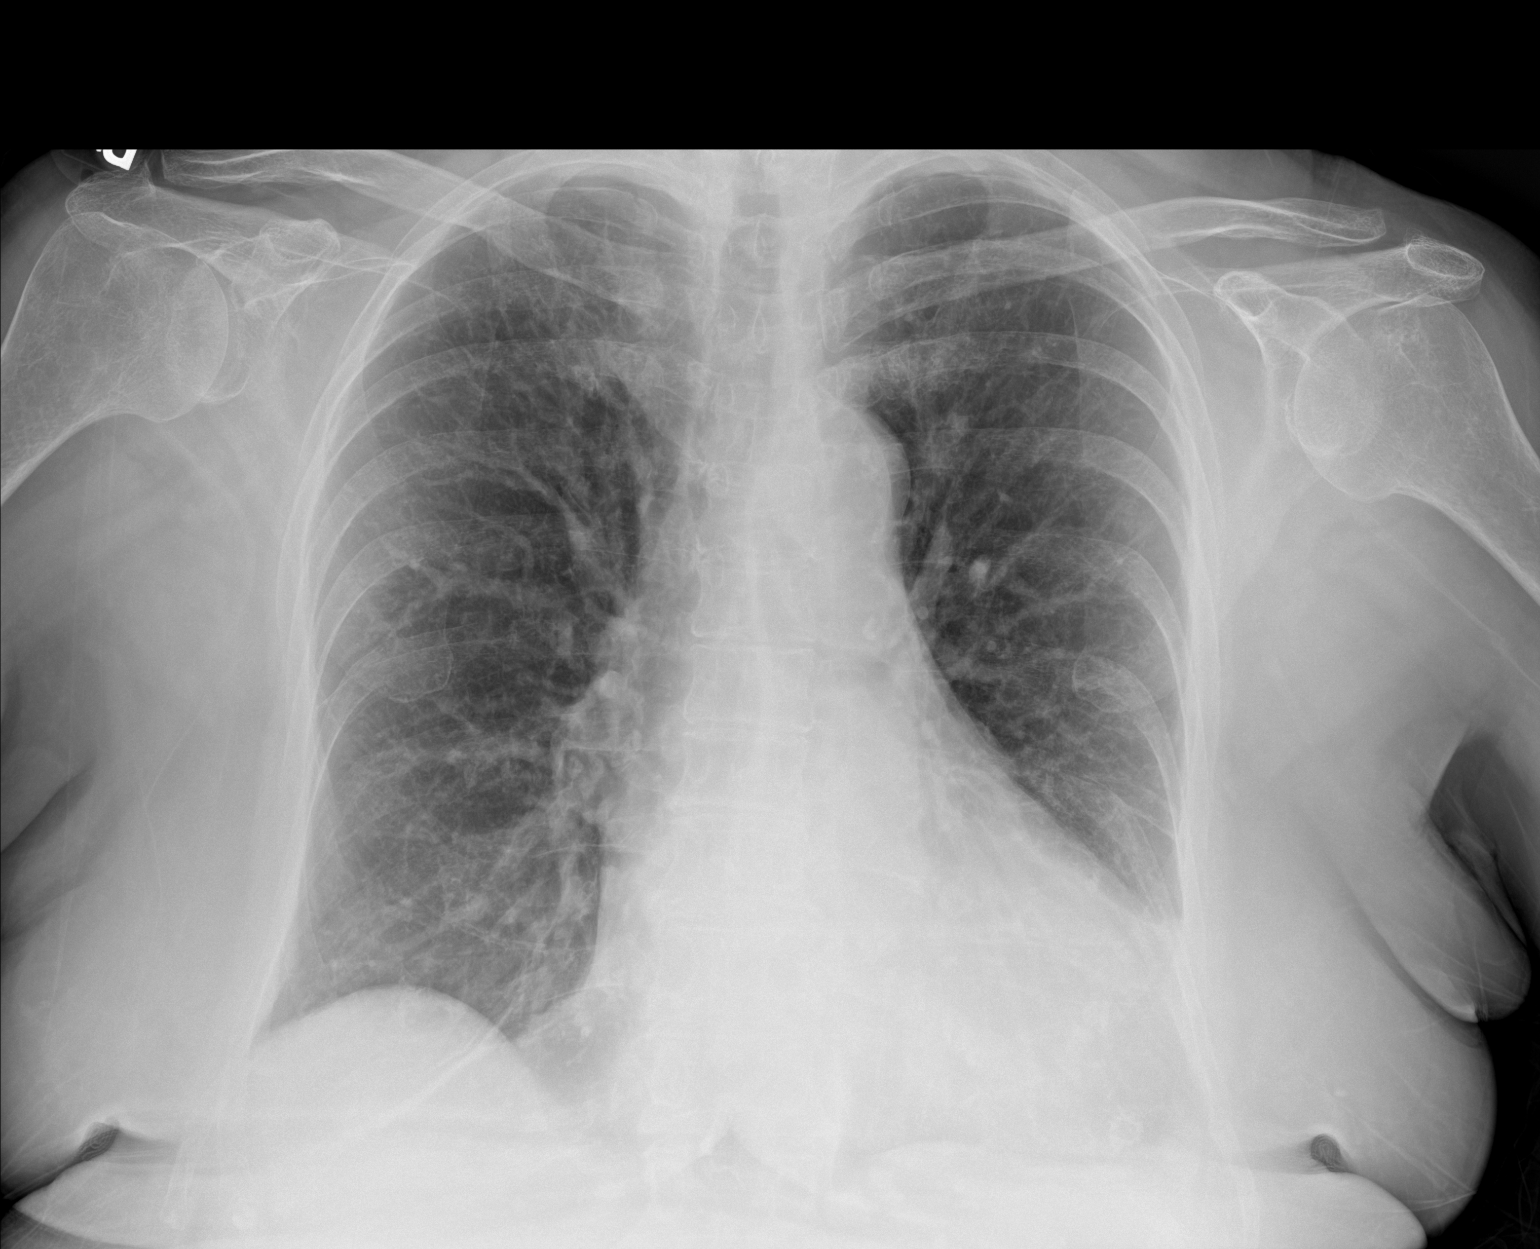

[1 of 1 positions shown; findings below may reference images not displayed]

FINDINGS: Similar enlargement the cardiac silhouette. No consolidation. No
visible pleural effusions or pneumothorax on this single AP
radiograph. No evidence of acute osseous abnormality.
IMPRESSION: 1. No evidence of acute cardiopulmonary disease.
2. Cardiomegaly.
